# Patient Record
Sex: Female | Born: 1945
Health system: Southern US, Community
[De-identification: ages and names within clinical notes are randomized; demographics above are authoritative.]

## PROBLEM LIST (undated history)

## (undated) DIAGNOSIS — I499 Cardiac arrhythmia, unspecified: Secondary | ICD-10-CM

## (undated) DIAGNOSIS — Q231 Congenital insufficiency of aortic valve: Secondary | ICD-10-CM

## (undated) DIAGNOSIS — I350 Nonrheumatic aortic (valve) stenosis: Secondary | ICD-10-CM

## (undated) DIAGNOSIS — R203 Hyperesthesia: Secondary | ICD-10-CM

## (undated) DIAGNOSIS — G4733 Obstructive sleep apnea (adult) (pediatric): Secondary | ICD-10-CM

## (undated) DIAGNOSIS — M1990 Unspecified osteoarthritis, unspecified site: Secondary | ICD-10-CM

## (undated) DIAGNOSIS — G4730 Sleep apnea, unspecified: Secondary | ICD-10-CM

## (undated) DIAGNOSIS — L853 Xerosis cutis: Secondary | ICD-10-CM

## (undated) DIAGNOSIS — I509 Heart failure, unspecified: Secondary | ICD-10-CM

## (undated) DIAGNOSIS — S060X9A Concussion with loss of consciousness of unspecified duration, initial encounter: Secondary | ICD-10-CM

## (undated) DIAGNOSIS — T4145XA Adverse effect of unspecified anesthetic, initial encounter: Secondary | ICD-10-CM

## (undated) DIAGNOSIS — I10 Essential (primary) hypertension: Secondary | ICD-10-CM

## (undated) DIAGNOSIS — Z9889 Other specified postprocedural states: Secondary | ICD-10-CM

## (undated) DIAGNOSIS — M4800 Spinal stenosis, site unspecified: Secondary | ICD-10-CM

## (undated) DIAGNOSIS — R112 Nausea with vomiting, unspecified: Secondary | ICD-10-CM

## (undated) DIAGNOSIS — E785 Hyperlipidemia, unspecified: Secondary | ICD-10-CM

## (undated) DIAGNOSIS — F32A Depression, unspecified: Secondary | ICD-10-CM

## (undated) DIAGNOSIS — L719 Rosacea, unspecified: Secondary | ICD-10-CM

## (undated) DIAGNOSIS — Z952 Presence of prosthetic heart valve: Secondary | ICD-10-CM

## (undated) DIAGNOSIS — E119 Type 2 diabetes mellitus without complications: Secondary | ICD-10-CM

## (undated) DIAGNOSIS — K760 Fatty (change of) liver, not elsewhere classified: Secondary | ICD-10-CM

## (undated) DIAGNOSIS — K635 Polyp of colon: Secondary | ICD-10-CM

## (undated) DIAGNOSIS — K5790 Diverticulosis of intestine, part unspecified, without perforation or abscess without bleeding: Secondary | ICD-10-CM

## (undated) DIAGNOSIS — T8859XA Other complications of anesthesia, initial encounter: Secondary | ICD-10-CM

## (undated) DIAGNOSIS — T7840XA Allergy, unspecified, initial encounter: Secondary | ICD-10-CM

## (undated) DIAGNOSIS — R011 Cardiac murmur, unspecified: Secondary | ICD-10-CM

## (undated) DIAGNOSIS — M1711 Unilateral primary osteoarthritis, right knee: Secondary | ICD-10-CM

## (undated) DIAGNOSIS — R51 Headache: Secondary | ICD-10-CM

## (undated) DIAGNOSIS — F329 Major depressive disorder, single episode, unspecified: Secondary | ICD-10-CM

## (undated) DIAGNOSIS — J189 Pneumonia, unspecified organism: Secondary | ICD-10-CM

## (undated) DIAGNOSIS — R0989 Other specified symptoms and signs involving the circulatory and respiratory systems: Secondary | ICD-10-CM

## (undated) DIAGNOSIS — R000 Tachycardia, unspecified: Secondary | ICD-10-CM

## (undated) DIAGNOSIS — Z8489 Family history of other specified conditions: Secondary | ICD-10-CM

## (undated) DIAGNOSIS — F419 Anxiety disorder, unspecified: Secondary | ICD-10-CM

## (undated) DIAGNOSIS — K219 Gastro-esophageal reflux disease without esophagitis: Secondary | ICD-10-CM

## (undated) DIAGNOSIS — R0602 Shortness of breath: Secondary | ICD-10-CM

## (undated) HISTORY — DX: Heart failure, unspecified: I50.9

## (undated) HISTORY — PX: TUBAL LIGATION: SHX77

## (undated) HISTORY — PX: HERNIA REPAIR: SHX51

## (undated) HISTORY — DX: Nonrheumatic aortic (valve) stenosis: I35.0

## (undated) HISTORY — PX: COLONOSCOPY: SHX174

## (undated) HISTORY — PX: CHOLECYSTECTOMY: SHX55

## (undated) HISTORY — DX: Cardiac arrhythmia, unspecified: I49.9

## (undated) HISTORY — DX: Diverticulosis of intestine, part unspecified, without perforation or abscess without bleeding: K57.90

## (undated) HISTORY — DX: Obstructive sleep apnea (adult) (pediatric): G47.33

## (undated) HISTORY — DX: Hyperlipidemia, unspecified: E78.5

## (undated) HISTORY — PX: EYE SURGERY: SHX253

## (undated) HISTORY — PX: INCISIONAL HERNIA REPAIR: SHX193

## (undated) HISTORY — PX: ABDOMINAL HYSTERECTOMY: SHX81

## (undated) HISTORY — DX: Polyp of colon: K63.5

## (undated) HISTORY — DX: Spinal stenosis, site unspecified: M48.00

## (undated) HISTORY — DX: Morbid (severe) obesity due to excess calories: E66.01

## (undated) HISTORY — DX: Type 2 diabetes mellitus without complications: E11.9

## (undated) HISTORY — PX: BREAST SURGERY: SHX581

## (undated) HISTORY — PX: BIOPSY BREAST: PRO8

## (undated) HISTORY — PX: JOINT REPLACEMENT: SHX530

## (undated) HISTORY — DX: Other specified symptoms and signs involving the circulatory and respiratory systems: R09.89

## (undated) HISTORY — PX: ANKLE RECONSTRUCTION: SHX1151

## (undated) HISTORY — DX: Allergy, unspecified, initial encounter: T78.40XA

---
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
## 2011-01-13 ENCOUNTER — Other Ambulatory Visit (HOSPITAL_COMMUNITY): Payer: Self-pay | Admitting: Orthopedic Surgery

## 2011-01-13 ENCOUNTER — Encounter (HOSPITAL_COMMUNITY)
Admission: RE | Admit: 2011-01-13 | Discharge: 2011-01-13 | Disposition: A | Discharge status: Home / Self Care | Payer: BC Managed Care – PPO | Source: Ambulatory Visit | Attending: Orthopedic Surgery | Admitting: Orthopedic Surgery

## 2011-01-13 ENCOUNTER — Ambulatory Visit (HOSPITAL_COMMUNITY)
Admission: RE | Admit: 2011-01-13 | Discharge: 2011-01-13 | Disposition: A | Discharge status: Home / Self Care | Payer: BC Managed Care – PPO | Source: Ambulatory Visit | Attending: Orthopedic Surgery | Admitting: Orthopedic Surgery

## 2011-01-13 DIAGNOSIS — M1710 Unilateral primary osteoarthritis, unspecified knee: Secondary | ICD-10-CM

## 2011-01-13 DIAGNOSIS — Z01812 Encounter for preprocedural laboratory examination: Secondary | ICD-10-CM | POA: Insufficient documentation

## 2011-01-13 DIAGNOSIS — IMO0002 Reserved for concepts with insufficient information to code with codable children: Secondary | ICD-10-CM | POA: Insufficient documentation

## 2011-01-13 DIAGNOSIS — Z01818 Encounter for other preprocedural examination: Secondary | ICD-10-CM | POA: Insufficient documentation

## 2011-01-13 LAB — CBC
HCT: 41.4 % (ref 36.0–46.0)
Hemoglobin: 13.6 g/dL (ref 12.0–15.0)
MCH: 29.2 pg (ref 26.0–34.0)
MCHC: 32.9 g/dL (ref 30.0–36.0)
MCV: 89 fL (ref 78.0–100.0)
Platelets: 257 10*3/uL (ref 150–400)
RBC: 4.65 MIL/uL (ref 3.87–5.11)
RDW: 13.5 % (ref 11.5–15.5)
WBC: 12 10*3/uL — ABNORMAL HIGH (ref 4.0–10.5)

## 2011-01-13 LAB — COMPREHENSIVE METABOLIC PANEL
ALT: 23 U/L (ref 0–35)
AST: 21 U/L (ref 0–37)
Albumin: 3.7 g/dL (ref 3.5–5.2)
Alkaline Phosphatase: 95 U/L (ref 39–117)
BUN: 16 mg/dL (ref 6–23)
CO2: 24 mEq/L (ref 19–32)
Calcium: 9.5 mg/dL (ref 8.4–10.5)
Chloride: 102 mEq/L (ref 96–112)
Creatinine, Ser: 0.76 mg/dL (ref 0.50–1.10)
GFR calc Af Amer: >60 mL/min (ref >60)
GFR calc non Af Amer: >60 mL/min (ref >60)
Glucose, Bld: 92 mg/dL (ref 70–99)
Potassium: 4.4 mEq/L (ref 3.5–5.1)
Sodium: 137 mEq/L (ref 135–145)
Total Bilirubin: 0.3 mg/dL (ref 0.3–1.2)
Total Protein: 7.1 g/dL (ref 6.0–8.3)

## 2011-01-13 LAB — PROTIME-INR
INR: 0.91 (ref 0.00–1.49)
Prothrombin Time: 12.4 seconds (ref 11.6–15.2)

## 2011-01-13 LAB — TYPE AND SCREEN
ABO/RH(D): A POS
Antibody Screen: NEGATIVE

## 2011-01-13 LAB — SURGICAL PCR SCREEN
MRSA, PCR: NEGATIVE
Staphylococcus aureus: NEGATIVE

## 2011-01-13 LAB — ABO/RH: ABO/RH(D): A POS

## 2011-01-19 ENCOUNTER — Inpatient Hospital Stay (HOSPITAL_COMMUNITY)
Admission: RE | Admit: 2011-01-19 | Payer: BC Managed Care – PPO | Source: Ambulatory Visit | Admitting: Orthopedic Surgery

## 2012-04-18 ENCOUNTER — Other Ambulatory Visit (HOSPITAL_COMMUNITY): Payer: BC Managed Care – PPO

## 2012-04-25 ENCOUNTER — Encounter (HOSPITAL_COMMUNITY): Admission: RE | Payer: Self-pay | Source: Ambulatory Visit

## 2012-04-25 ENCOUNTER — Inpatient Hospital Stay (HOSPITAL_COMMUNITY)
Admission: RE | Admit: 2012-04-25 | Payer: BC Managed Care – PPO | Source: Ambulatory Visit | Admitting: Orthopedic Surgery

## 2012-04-25 SURGERY — ARTHROPLASTY, KNEE, TOTAL
Anesthesia: General | Laterality: Right

## 2012-04-27 ENCOUNTER — Ambulatory Visit
Admission: RE | Admit: 2012-04-27 | Discharge: 2012-04-27 | Disposition: A | Discharge status: Home / Self Care | Payer: BC Managed Care – PPO | Source: Ambulatory Visit | Attending: Otolaryngology | Admitting: Otolaryngology

## 2012-04-27 ENCOUNTER — Other Ambulatory Visit: Payer: Self-pay | Admitting: Otolaryngology

## 2012-04-27 DIAGNOSIS — J329 Chronic sinusitis, unspecified: Secondary | ICD-10-CM

## 2012-05-30 ENCOUNTER — Other Ambulatory Visit (HOSPITAL_COMMUNITY): Payer: BC Managed Care – PPO

## 2012-06-06 ENCOUNTER — Inpatient Hospital Stay (HOSPITAL_COMMUNITY)
Admission: RE | Admit: 2012-06-06 | Payer: BC Managed Care – PPO | Source: Ambulatory Visit | Admitting: Orthopedic Surgery

## 2012-06-06 ENCOUNTER — Encounter (HOSPITAL_COMMUNITY): Admission: RE | Payer: Self-pay | Source: Ambulatory Visit

## 2012-06-06 SURGERY — ARTHROPLASTY, KNEE, TOTAL
Anesthesia: Spinal | Laterality: Right

## 2012-11-09 DIAGNOSIS — S060X9A Concussion with loss of consciousness of unspecified duration, initial encounter: Secondary | ICD-10-CM

## 2012-11-09 DIAGNOSIS — S060XAA Concussion with loss of consciousness status unknown, initial encounter: Secondary | ICD-10-CM

## 2012-11-09 HISTORY — DX: Concussion with loss of consciousness status unknown, initial encounter: S06.0XAA

## 2012-11-09 HISTORY — DX: Concussion with loss of consciousness of unspecified duration, initial encounter: S06.0X9A

## 2013-05-15 ENCOUNTER — Encounter (HOSPITAL_COMMUNITY): Payer: Self-pay | Admitting: Pharmacy Technician

## 2013-05-16 ENCOUNTER — Other Ambulatory Visit: Payer: Self-pay | Admitting: Orthopedic Surgery

## 2013-05-22 ENCOUNTER — Encounter (HOSPITAL_COMMUNITY): Payer: Self-pay

## 2013-05-22 ENCOUNTER — Encounter (HOSPITAL_COMMUNITY)
Admission: RE | Admit: 2013-05-22 | Discharge: 2013-05-22 | Disposition: A | Discharge status: Home / Self Care | Payer: Medicare Other | Source: Ambulatory Visit | Attending: Orthopedic Surgery | Admitting: Orthopedic Surgery

## 2013-05-22 ENCOUNTER — Ambulatory Visit (HOSPITAL_COMMUNITY)
Admission: RE | Admit: 2013-05-22 | Discharge: 2013-05-22 | Disposition: A | Discharge status: Home / Self Care | Payer: Medicare Other | Source: Ambulatory Visit | Attending: Anesthesiology | Admitting: Anesthesiology

## 2013-05-22 DIAGNOSIS — Z01818 Encounter for other preprocedural examination: Secondary | ICD-10-CM | POA: Insufficient documentation

## 2013-05-22 DIAGNOSIS — Z01810 Encounter for preprocedural cardiovascular examination: Secondary | ICD-10-CM | POA: Insufficient documentation

## 2013-05-22 DIAGNOSIS — Z01812 Encounter for preprocedural laboratory examination: Secondary | ICD-10-CM | POA: Insufficient documentation

## 2013-05-22 HISTORY — DX: Xerosis cutis: L85.3

## 2013-05-22 HISTORY — DX: Gastro-esophageal reflux disease without esophagitis: K21.9

## 2013-05-22 HISTORY — DX: Unspecified osteoarthritis, unspecified site: M19.90

## 2013-05-22 HISTORY — DX: Cardiac murmur, unspecified: R01.1

## 2013-05-22 HISTORY — DX: Type 2 diabetes mellitus without complications: E11.9

## 2013-05-22 HISTORY — DX: Other specified postprocedural states: Z98.890

## 2013-05-22 HISTORY — DX: Family history of other specified conditions: Z84.89

## 2013-05-22 HISTORY — DX: Nausea with vomiting, unspecified: R11.2

## 2013-05-22 HISTORY — DX: Congenital insufficiency of aortic valve: Q23.1

## 2013-05-22 HISTORY — DX: Cardiac arrhythmia, unspecified: I49.9

## 2013-05-22 HISTORY — DX: Adverse effect of unspecified anesthetic, initial encounter: T41.45XA

## 2013-05-22 HISTORY — DX: Tachycardia, unspecified: R00.0

## 2013-05-22 HISTORY — DX: Sleep apnea, unspecified: G47.30

## 2013-05-22 HISTORY — DX: Concussion with loss of consciousness of unspecified duration, initial encounter: S06.0X9A

## 2013-05-22 HISTORY — DX: Other complications of anesthesia, initial encounter: T88.59XA

## 2013-05-22 LAB — CBC
HCT: 40.8 % (ref 36.0–46.0)
Hemoglobin: 13.3 g/dL (ref 12.0–15.0)
MCH: 30.2 pg (ref 26.0–34.0)
MCHC: 32.6 g/dL (ref 30.0–36.0)
MCV: 92.5 fL (ref 78.0–100.0)
Platelets: 235 10*3/uL (ref 150–400)
RBC: 4.41 MIL/uL (ref 3.87–5.11)
RDW: 13.4 % (ref 11.5–15.5)
WBC: 12.2 10*3/uL — ABNORMAL HIGH (ref 4.0–10.5)

## 2013-05-22 LAB — BASIC METABOLIC PANEL
BUN: 9 mg/dL (ref 6–23)
CO2: 23 mEq/L (ref 19–32)
Calcium: 9.3 mg/dL (ref 8.4–10.5)
Chloride: 103 mEq/L (ref 96–112)
Creatinine, Ser: 0.68 mg/dL (ref 0.50–1.10)
GFR calc Af Amer: >90 mL/min (ref >90)
GFR calc non Af Amer: 90 mL/min — ABNORMAL LOW (ref >90)
Glucose, Bld: 83 mg/dL (ref 70–99)
Potassium: 4.5 mEq/L (ref 3.7–5.3)
Sodium: 140 mEq/L (ref 137–147)

## 2013-05-22 LAB — TYPE AND SCREEN
ABO/RH(D): A POS
Antibody Screen: NEGATIVE

## 2013-05-22 LAB — APTT: aPTT: 30 seconds (ref 24–37)

## 2013-05-22 LAB — SURGICAL PCR SCREEN
MRSA, PCR: NEGATIVE
Staphylococcus aureus: POSITIVE — AB

## 2013-05-22 LAB — PROTIME-INR
INR: 0.96 (ref 0.00–1.49)
Prothrombin Time: 12.6 seconds (ref 11.6–15.2)

## 2013-05-22 NOTE — Progress Notes (Signed)
I call a prescription to [facility] [address], [address], [place] for Mupirocin 2 % ointment- apply to each nostril 2 times a day for 5 days.

## 2013-05-22 NOTE — Progress Notes (Signed)
Patient informed Nurse that she had a stress test with Cardiologist Dr. Donnetta Hutching in [place]. Will request records. PCP is Dr. Reita Cliche also in [facility].

## 2013-05-22 NOTE — Pre-Procedure Instructions (Signed)
Kara Lee  05/22/2013   Your procedure is scheduled on:  Tuesday May 29, 2013 at 7:30 AM.  Report to [facility] Short Stay Entrance "A"  Admitting at 5:30 AM.  Call this number if you have problems the morning of surgery: [phone]   Remember:   Do not eat food or drink liquids after midnight.   Take these medicines the morning of surgery with A SIP OF WATER: Acetaminophen (Tylenol) if needed for pain, Atenolol (Tenormin), Clonazepam (Klonopin), Fluoxetine (Prozac), Pantoprazole (Protonix), and eye drops.    Do NOT take any diabetic medications the morning of your surgery   Do not wear jewelry, make-up or nail polish.  Do not wear lotions, powders, or perfumes. You may wear deodorant.  Do not shave 48 hours prior to surgery.   Do not bring valuables to the hospital.  [facility] is not responsible for any belongings or valuables.               Contacts, dentures or bridgework may not be worn into surgery.  Leave suitcase in the car. After surgery it may be brought to your room.  For patients admitted to the hospital, discharge time is determined by your treatment team.               Patients discharged the day of surgery will not be allowed to drive home.  Name and phone number of your driver: Family/Friend  Special Instructions: Please shower using CHG soap the night before and the morning of your surgery   Please read over the following fact sheets that you were given: Pain Booklet, Coughing and Deep Breathing, Blood Transfusion Information, Total Joint Packet, MRSA Information and Surgical Site Infection Prevention    [place]- Preparing For Surgery  Before surgery, you can play an important role. Because skin is not sterile, your skin needs to be as free of germs as possible. You can reduce the number of germs on your skin by washing with CHG (chlorahexidine gluconate) Soap before surgery.  CHG is an antiseptic cleaner which kills germs and bonds with the skin to  continue killing germs even after washing.  Please do not use if you have an allergy to CHG or antibacterial soaps. If your skin becomes reddened/irritated stop using the CHG.  Do not shave (including legs and underarms) for at least 48 hours prior to first CHG shower. It is OK to shave your face.  Please follow these instructions carefully.   1. Shower the night before surgery and the morning of surgery with CHG.   2. If you chose to wash your hair, wash your hair first as usual with your normal shampoo.  3. After you shampoo, rinse your hair and body thoroughly to remove the shampoo.  4. Use CHG as you would any other liquid soap. You can apply CHG directly to the skin and wash gently with a scrungie or a clean washcloth.   5. Apply the CHG Soap to your body ONLY FROM THE NECK DOWN.  Do not use on open wounds or open sores. Avoid contact with your eyes, ears, mouth and genitals (private parts). Wash genitals (private parts) with your normal soap.  6. Wash thoroughly, paying special attention to the area where your surgery will be performed.  7. Thoroughly rinse your body with warm water from the neck down.  8. DO NOT shower/wash with your normal soap after using and rinsing off the CHG Soap.  9. Pat yourself dry with  a clean towel.   10. Wear clean pajamas   11. Place clean sheets on your bed the night of your first shower and do not sleep with pets.   Day of Surgery: Do not apply any deodorants/lotions. Please wear clean clothes to the hospital/surgery center.

## 2013-05-23 NOTE — Progress Notes (Signed)
[place] office called about EKG no recent EKG noted.

## 2013-05-31 ENCOUNTER — Other Ambulatory Visit: Payer: Self-pay | Admitting: Orthopedic Surgery

## 2013-07-20 ENCOUNTER — Encounter (HOSPITAL_COMMUNITY): Payer: Self-pay

## 2013-07-20 ENCOUNTER — Other Ambulatory Visit (HOSPITAL_COMMUNITY): Payer: Self-pay | Admitting: *Deleted

## 2013-07-20 ENCOUNTER — Encounter (HOSPITAL_COMMUNITY)
Admission: RE | Admit: 2013-07-20 | Discharge: 2013-07-20 | Disposition: A | Discharge status: Home / Self Care | Payer: Medicare Other | Source: Ambulatory Visit | Attending: Orthopedic Surgery | Admitting: Orthopedic Surgery

## 2013-07-20 DIAGNOSIS — Z01812 Encounter for preprocedural laboratory examination: Secondary | ICD-10-CM | POA: Insufficient documentation

## 2013-07-20 HISTORY — DX: Headache: R51

## 2013-07-20 HISTORY — DX: Major depressive disorder, single episode, unspecified: F32.9

## 2013-07-20 HISTORY — DX: Pneumonia, unspecified organism: J18.9

## 2013-07-20 HISTORY — DX: Shortness of breath: R06.02

## 2013-07-20 HISTORY — DX: Fatty (change of) liver, not elsewhere classified: K76.0

## 2013-07-20 HISTORY — DX: Rosacea, unspecified: L71.9

## 2013-07-20 HISTORY — DX: Depression, unspecified: F32.A

## 2013-07-20 HISTORY — DX: Hyperesthesia: R20.3

## 2013-07-20 LAB — SURGICAL PCR SCREEN
MRSA, PCR: NEGATIVE
Staphylococcus aureus: NEGATIVE

## 2013-07-20 LAB — BASIC METABOLIC PANEL
BUN: 9 mg/dL (ref 6–23)
CO2: 20 mEq/L (ref 19–32)
Calcium: 9.3 mg/dL (ref 8.4–10.5)
Chloride: 101 mEq/L (ref 96–112)
Creatinine, Ser: 0.56 mg/dL (ref 0.50–1.10)
GFR calc Af Amer: >90 mL/min (ref >90)
GFR calc non Af Amer: >90 mL/min (ref >90)
Glucose, Bld: 147 mg/dL — ABNORMAL HIGH (ref 70–99)
Potassium: 4.4 mEq/L (ref 3.7–5.3)
Sodium: 137 mEq/L (ref 137–147)

## 2013-07-20 LAB — APTT: aPTT: 27 seconds (ref 24–37)

## 2013-07-20 LAB — CBC
HCT: 40.7 % (ref 36.0–46.0)
Hemoglobin: 13.6 g/dL (ref 12.0–15.0)
MCH: 30.4 pg (ref 26.0–34.0)
MCHC: 33.4 g/dL (ref 30.0–36.0)
MCV: 91.1 fL (ref 78.0–100.0)
Platelets: 246 10*3/uL (ref 150–400)
RBC: 4.47 MIL/uL (ref 3.87–5.11)
RDW: 13.6 % (ref 11.5–15.5)
WBC: 10.1 10*3/uL (ref 4.0–10.5)

## 2013-07-20 LAB — PROTIME-INR
INR: 0.98 (ref 0.00–1.49)
Prothrombin Time: 12.8 seconds (ref 11.6–15.2)

## 2013-07-20 LAB — TYPE AND SCREEN
ABO/RH(D): A POS
Antibody Screen: NEGATIVE

## 2013-07-20 NOTE — Pre-Procedure Instructions (Signed)
Kara Lee  07/20/2013   Your procedure is scheduled on:  Tuesday, July 31, 2013 at 7:30 AM.   Report to [facility] Entrance "A" Admitting Office at 5:30 AM.   Call this number if you have problems the morning of surgery: [phone]   Remember:   Do not eat food or drink liquids after midnight Monday, 07/30/13.   Take these medicines the morning of surgery with A SIP OF WATER: atenolol (TENORMIN), clonazePAM (KLONOPIN), FLUoxetine (PROZAC), pantoprazole (PROTONIX), acetaminophen (TYLENOL) - if needed. You may use your eye drops.  Do not take your diabetic medications the morning of surgery.    Do not wear jewelry, make-up or nail polish.  Do not wear lotions, powders, or perfumes. You may wear deodorant.  Do not shave 48 hours prior to surgery.   Do not bring valuables to the hospital.  [facility] is not responsible                  for any belongings or valuables.               Contacts, dentures or bridgework may not be worn into surgery.  Leave suitcase in the car. After surgery it may be brought to your room.  For patients admitted to the hospital, discharge time is determined by your                treatment team.               Special Instructions: [place] - Preparing for Surgery  Before surgery, you can play an important role.  Because skin is not sterile, your skin needs to be as free of germs as possible.  You can reduce the number of germs on you skin by washing with CHG (chlorahexidine gluconate) soap before surgery.  CHG is an antiseptic cleaner which kills germs and bonds with the skin to continue killing germs even after washing.  Please DO NOT use if you have an allergy to CHG or antibacterial soaps.  If your skin becomes reddened/irritated stop using the CHG and inform your nurse when you arrive at Short Stay.  Do not shave (including legs and underarms) for at least 48 hours prior to the first CHG shower.  You may shave your face.  Please follow  these instructions carefully:   1.  Shower with CHG Soap the night before surgery and the                                morning of Surgery.  2.  If you choose to wash your hair, wash your hair first as usual with your       normal shampoo.  3.  After you shampoo, rinse your hair and body thoroughly to remove the                      Shampoo.  4.  Use CHG as you would any other liquid soap.  You can apply chg directly       to the skin and wash gently with scrungie or a clean washcloth.  5.  Apply the CHG Soap to your body ONLY FROM THE NECK DOWN.        Do not use on open wounds or open sores.  Avoid contact with your eyes, ears, mouth and genitals (private parts).  Wash genitals (private parts) with your normal  soap.  6.  Wash thoroughly, paying special attention to the area where your surgery        will be performed.  7.  Thoroughly rinse your body with warm water from the neck down.  8.  DO NOT shower/wash with your normal soap after using and rinsing off       the CHG Soap.  9.  Pat yourself dry with a clean towel.            10.  Wear clean pajamas.            11.  Place clean sheets on your bed the night of your first shower and do not        sleep with pets.  Day of Surgery  Do not apply any lotions the morning of surgery.  Please wear clean clothes to the hospital/surgery center.     Please read over the following fact sheets that you were given: Pain Booklet, Coughing and Deep Breathing, Blood Transfusion Information, MRSA Information and Surgical Site Infection Prevention

## 2013-07-30 MED ORDER — CEFAZOLIN SODIUM-DEXTROSE 2-3 GM-% IV SOLR: 2.0000 g | INTRAVENOUS | Status: DC

## 2013-07-31 ENCOUNTER — Encounter (HOSPITAL_COMMUNITY): Payer: Medicare Other | Admitting: Anesthesiology

## 2013-07-31 ENCOUNTER — Inpatient Hospital Stay (HOSPITAL_COMMUNITY): Payer: Medicare Other

## 2013-07-31 ENCOUNTER — Encounter (HOSPITAL_COMMUNITY): Payer: Self-pay | Admitting: *Deleted

## 2013-07-31 ENCOUNTER — Encounter (HOSPITAL_COMMUNITY)
Admission: RE | Disposition: A | Discharge status: Home Health | Payer: Self-pay | Source: Ambulatory Visit | Attending: Orthopedic Surgery

## 2013-07-31 ENCOUNTER — Inpatient Hospital Stay (HOSPITAL_COMMUNITY)
Admission: RE | Admit: 2013-07-31 | Discharge: 2013-08-01 | DRG: 489 | Disposition: A | Discharge status: Home Health | Payer: Medicare Other | Source: Ambulatory Visit | Attending: Orthopedic Surgery | Admitting: Orthopedic Surgery

## 2013-07-31 ENCOUNTER — Inpatient Hospital Stay (HOSPITAL_COMMUNITY): Payer: Medicare Other | Admitting: Anesthesiology

## 2013-07-31 DIAGNOSIS — K219 Gastro-esophageal reflux disease without esophagitis: Secondary | ICD-10-CM | POA: Diagnosis present

## 2013-07-31 DIAGNOSIS — M179 Osteoarthritis of knee, unspecified: Secondary | ICD-10-CM | POA: Diagnosis present

## 2013-07-31 DIAGNOSIS — G4730 Sleep apnea, unspecified: Secondary | ICD-10-CM | POA: Diagnosis present

## 2013-07-31 DIAGNOSIS — M1711 Unilateral primary osteoarthritis, right knee: Secondary | ICD-10-CM

## 2013-07-31 DIAGNOSIS — Z91013 Allergy to seafood: Secondary | ICD-10-CM

## 2013-07-31 DIAGNOSIS — Z885 Allergy status to narcotic agent status: Secondary | ICD-10-CM

## 2013-07-31 DIAGNOSIS — Z888 Allergy status to other drugs, medicaments and biological substances status: Secondary | ICD-10-CM

## 2013-07-31 DIAGNOSIS — Z833 Family history of diabetes mellitus: Secondary | ICD-10-CM

## 2013-07-31 DIAGNOSIS — E119 Type 2 diabetes mellitus without complications: Secondary | ICD-10-CM | POA: Diagnosis present

## 2013-07-31 DIAGNOSIS — F3289 Other specified depressive episodes: Secondary | ICD-10-CM | POA: Diagnosis present

## 2013-07-31 DIAGNOSIS — F329 Major depressive disorder, single episode, unspecified: Secondary | ICD-10-CM | POA: Diagnosis present

## 2013-07-31 DIAGNOSIS — M1710 Unilateral primary osteoarthritis, unspecified knee: Principal | ICD-10-CM | POA: Diagnosis present

## 2013-07-31 DIAGNOSIS — Z882 Allergy status to sulfonamides status: Secondary | ICD-10-CM

## 2013-07-31 DIAGNOSIS — Z91040 Latex allergy status: Secondary | ICD-10-CM

## 2013-07-31 HISTORY — DX: Unilateral primary osteoarthritis, right knee: M17.11

## 2013-07-31 HISTORY — PX: PARTIAL KNEE ARTHROPLASTY: SHX2174

## 2013-07-31 LAB — GLUCOSE, CAPILLARY
Glucose-Capillary: 118 mg/dL — ABNORMAL HIGH (ref 70–99)
Glucose-Capillary: 143 mg/dL — ABNORMAL HIGH (ref 70–99)
Glucose-Capillary: 115 mg/dL — ABNORMAL HIGH (ref 70–99)
Glucose-Capillary: 111 mg/dL — ABNORMAL HIGH (ref 70–99)

## 2013-07-31 SURGERY — ARTHROPLASTY, KNEE, UNICOMPARTMENTAL
Anesthesia: General | Site: Knee | Laterality: Right

## 2013-07-31 MED ORDER — KETOROLAC TROMETHAMINE 15 MG/ML IJ SOLN
7.5000 mg | Freq: Four times a day (QID) | INTRAMUSCULAR | Status: AC
  Administered 2013-07-31 – 2013-08-01 (×3): 7.5 mg via INTRAVENOUS
  Filled 2013-07-31 (×2): qty 1

## 2013-07-31 MED ORDER — ONDANSETRON HCL 4 MG/2ML IJ SOLN: 4.0000 mg | Freq: Four times a day (QID) | INTRAMUSCULAR | Status: DC | PRN

## 2013-07-31 MED ORDER — METOCLOPRAMIDE HCL 5 MG/ML IJ SOLN: 5.0000 mg | Freq: Three times a day (TID) | INTRAMUSCULAR | Status: DC | PRN

## 2013-07-31 MED ORDER — ONDANSETRON HCL 4 MG/2ML IJ SOLN
INTRAMUSCULAR | Status: AC
  Filled 2013-07-31: qty 2

## 2013-07-31 MED ORDER — SENNA 8.6 MG PO TABS
1.0000 | ORAL_TABLET | Freq: Two times a day (BID) | ORAL | Status: DC
  Administered 2013-08-01 (×2): 8.6 mg via ORAL
  Filled 2013-07-31 (×4): qty 1

## 2013-07-31 MED ORDER — EPHEDRINE SULFATE 50 MG/ML IJ SOLN
INTRAMUSCULAR | Status: AC
  Filled 2013-07-31: qty 1

## 2013-07-31 MED ORDER — FLUOXETINE HCL 40 MG PO CAPS: 40.0000 mg | ORAL_CAPSULE | Freq: Every day | ORAL | Status: DC

## 2013-07-31 MED ORDER — SUCCINYLCHOLINE CHLORIDE 20 MG/ML IJ SOLN
INTRAMUSCULAR | Status: AC
  Filled 2013-07-31: qty 1

## 2013-07-31 MED ORDER — DEXTROSE 5 % IV SOLN
500.0000 mg | Freq: Four times a day (QID) | INTRAVENOUS | Status: DC | PRN
  Filled 2013-07-31: qty 5

## 2013-07-31 MED ORDER — POTASSIUM CHLORIDE IN NACL 20-0.45 MEQ/L-% IV SOLN
INTRAVENOUS | Status: DC
  Administered 2013-07-31: 20:00:00 via INTRAVENOUS
  Filled 2013-07-31 (×6): qty 1000

## 2013-07-31 MED ORDER — METFORMIN HCL ER 500 MG PO TB24: 500.0000 mg | ORAL_TABLET | Freq: Every day | ORAL | Status: DC

## 2013-07-31 MED ORDER — ACETAMINOPHEN 650 MG RE SUPP: 650.0000 mg | Freq: Four times a day (QID) | RECTAL | Status: DC | PRN

## 2013-07-31 MED ORDER — BUPIVACAINE LIPOSOME 1.3 % IJ SUSP
20.0000 mL | Freq: Once | INTRAMUSCULAR | Status: DC
  Filled 2013-07-31: qty 20

## 2013-07-31 MED ORDER — ALUM & MAG HYDROXIDE-SIMETH 200-200-20 MG/5ML PO SUSP: 30.0000 mL | ORAL | Status: DC | PRN

## 2013-07-31 MED ORDER — FENTANYL CITRATE 0.05 MG/ML IJ SOLN
INTRAMUSCULAR | Status: AC
  Filled 2013-07-31: qty 5

## 2013-07-31 MED ORDER — PHENOL 1.4 % MT LIQD: 1.0000 | OROMUCOSAL | Status: DC | PRN

## 2013-07-31 MED ORDER — METOCLOPRAMIDE HCL 10 MG PO TABS: 5.0000 mg | ORAL_TABLET | Freq: Three times a day (TID) | ORAL | Status: DC | PRN

## 2013-07-31 MED ORDER — PROPOFOL 10 MG/ML IV BOLUS
INTRAVENOUS | Status: AC
  Filled 2013-07-31: qty 20

## 2013-07-31 MED ORDER — POLYETHYLENE GLYCOL 3350 17 G PO PACK: 17.0000 g | PACK | Freq: Every day | ORAL | Status: DC | PRN

## 2013-07-31 MED ORDER — MENTHOL 3 MG MT LOZG: 1.0000 | LOZENGE | OROMUCOSAL | Status: DC | PRN

## 2013-07-31 MED ORDER — FENTANYL CITRATE 0.05 MG/ML IJ SOLN
INTRAMUSCULAR | Status: AC
  Filled 2013-07-31: qty 2

## 2013-07-31 MED ORDER — ROCURONIUM BROMIDE 100 MG/10ML IV SOLN
INTRAVENOUS | Status: DC | PRN
  Administered 2013-07-31: 10 mg via INTRAVENOUS
  Administered 2013-07-31: 30 mg via INTRAVENOUS

## 2013-07-31 MED ORDER — SODIUM CHLORIDE 0.9 % IJ SOLN
INTRAMUSCULAR | Status: DC | PRN
  Administered 2013-07-31: 40 mL

## 2013-07-31 MED ORDER — BUPIVACAINE LIPOSOME 1.3 % IJ SUSP
INTRAMUSCULAR | Status: DC | PRN
  Administered 2013-07-31: 20 mL

## 2013-07-31 MED ORDER — BISACODYL 10 MG RE SUPP: 10.0000 mg | Freq: Every day | RECTAL | Status: DC | PRN

## 2013-07-31 MED ORDER — LACTATED RINGERS IV SOLN
INTRAVENOUS | Status: DC | PRN
  Administered 2013-07-31 (×2): via INTRAVENOUS

## 2013-07-31 MED ORDER — KETOROLAC TROMETHAMINE 15 MG/ML IJ SOLN: 7.5000 mg | Freq: Four times a day (QID) | INTRAMUSCULAR | Status: DC

## 2013-07-31 MED ORDER — LIDOCAINE HCL 4 % MT SOLN
OROMUCOSAL | Status: DC | PRN
  Administered 2013-07-31: 4 mL via TOPICAL

## 2013-07-31 MED ORDER — PANTOPRAZOLE SODIUM 40 MG PO TBEC
40.0000 mg | DELAYED_RELEASE_TABLET | Freq: Every day | ORAL | Status: DC
  Administered 2013-08-01: 40 mg via ORAL
  Filled 2013-07-31: qty 1

## 2013-07-31 MED ORDER — INSULIN ASPART 100 UNIT/ML ~~LOC~~ SOLN
0.0000 [IU] | Freq: Three times a day (TID) | SUBCUTANEOUS | Status: DC
  Administered 2013-08-01: 2 [IU] via SUBCUTANEOUS

## 2013-07-31 MED ORDER — OXYCODONE HCL 5 MG PO TABS
5.0000 mg | ORAL_TABLET | ORAL | Status: DC | PRN
  Administered 2013-07-31 – 2013-08-01 (×5): 10 mg via ORAL
  Filled 2013-07-31 (×5): qty 2

## 2013-07-31 MED ORDER — METHOCARBAMOL 500 MG PO TABS
ORAL_TABLET | ORAL | Status: AC
  Administered 2013-07-31: 500 mg via ORAL
  Filled 2013-07-31: qty 1

## 2013-07-31 MED ORDER — SODIUM CHLORIDE 0.9 % IR SOLN
Status: DC | PRN
  Administered 2013-07-31: 3000 mL

## 2013-07-31 MED ORDER — ROCURONIUM BROMIDE 50 MG/5ML IV SOLN
INTRAVENOUS | Status: AC
  Filled 2013-07-31: qty 1

## 2013-07-31 MED ORDER — SODIUM CHLORIDE 0.9 % IJ SOLN
INTRAMUSCULAR | Status: AC
  Filled 2013-07-31: qty 10

## 2013-07-31 MED ORDER — PHENYLEPHRINE 40 MCG/ML (10ML) SYRINGE FOR IV PUSH (FOR BLOOD PRESSURE SUPPORT)
PREFILLED_SYRINGE | INTRAVENOUS | Status: AC
  Filled 2013-07-31: qty 10

## 2013-07-31 MED ORDER — LIDOCAINE HCL (CARDIAC) 20 MG/ML IV SOLN
INTRAVENOUS | Status: AC
  Filled 2013-07-31: qty 5

## 2013-07-31 MED ORDER — ONDANSETRON HCL 4 MG PO TABS: 4.0000 mg | ORAL_TABLET | Freq: Four times a day (QID) | ORAL | Status: DC | PRN

## 2013-07-31 MED ORDER — EPHEDRINE SULFATE 50 MG/ML IJ SOLN
INTRAMUSCULAR | Status: DC | PRN
  Administered 2013-07-31 (×2): 10 mg via INTRAVENOUS

## 2013-07-31 MED ORDER — FENTANYL CITRATE 0.05 MG/ML IJ SOLN
INTRAMUSCULAR | Status: DC | PRN
  Administered 2013-07-31: 150 ug via INTRAVENOUS
  Administered 2013-07-31 (×2): 100 ug via INTRAVENOUS

## 2013-07-31 MED ORDER — CLONAZEPAM 0.5 MG PO TABS
0.5000 mg | ORAL_TABLET | Freq: Two times a day (BID) | ORAL | Status: DC
  Administered 2013-07-31 – 2013-08-01 (×2): 0.5 mg via ORAL
  Filled 2013-07-31 (×2): qty 1

## 2013-07-31 MED ORDER — ONDANSETRON HCL 4 MG/2ML IJ SOLN
INTRAMUSCULAR | Status: DC | PRN
  Administered 2013-07-31 (×2): 4 mg via INTRAVENOUS

## 2013-07-31 MED ORDER — OXYCODONE-ACETAMINOPHEN 10-325 MG PO TABS: 1.0000 | ORAL_TABLET | Freq: Four times a day (QID) | ORAL | Status: DC | PRN

## 2013-07-31 MED ORDER — PROMETHAZINE HCL 25 MG PO TABS: 25.0000 mg | ORAL_TABLET | Freq: Four times a day (QID) | ORAL | Status: DC | PRN

## 2013-07-31 MED ORDER — CEFAZOLIN SODIUM-DEXTROSE 2-3 GM-% IV SOLR
INTRAVENOUS | Status: AC
  Administered 2013-07-31: 2 g via INTRAVENOUS
  Filled 2013-07-31: qty 50

## 2013-07-31 MED ORDER — DIPHENHYDRAMINE HCL 12.5 MG/5ML PO ELIX
12.5000 mg | ORAL_SOLUTION | ORAL | Status: DC | PRN
  Administered 2013-08-01: 25 mg via ORAL
  Filled 2013-07-31: qty 10

## 2013-07-31 MED ORDER — ACETAMINOPHEN 325 MG PO TABS: 650.0000 mg | ORAL_TABLET | Freq: Four times a day (QID) | ORAL | Status: DC | PRN

## 2013-07-31 MED ORDER — METHOCARBAMOL 500 MG PO TABS
500.0000 mg | ORAL_TABLET | Freq: Four times a day (QID) | ORAL | Status: DC | PRN
  Administered 2013-07-31 – 2013-08-01 (×4): 500 mg via ORAL
  Filled 2013-07-31 (×3): qty 1

## 2013-07-31 MED ORDER — ESTRADIOL 0.1 MG/GM VA CREA
1.0000 | TOPICAL_CREAM | VAGINAL | Status: DC
  Filled 2013-07-31: qty 42.5

## 2013-07-31 MED ORDER — GLYCOPYRROLATE 0.2 MG/ML IJ SOLN
INTRAMUSCULAR | Status: DC | PRN
  Administered 2013-07-31: 0.4 mg via INTRAVENOUS

## 2013-07-31 MED ORDER — MIDAZOLAM HCL 2 MG/2ML IJ SOLN
INTRAMUSCULAR | Status: AC
Start: 2013-07-31 — End: 2013-07-31
  Filled 2013-07-31: qty 2

## 2013-07-31 MED ORDER — RIVAROXABAN 10 MG PO TABS
10.0000 mg | ORAL_TABLET | Freq: Every day | ORAL | Status: DC
  Administered 2013-08-01: 10 mg via ORAL
  Filled 2013-07-31 (×2): qty 1

## 2013-07-31 MED ORDER — ATENOLOL 25 MG PO TABS
25.0000 mg | ORAL_TABLET | Freq: Every day | ORAL | Status: DC
  Administered 2013-07-31: 25 mg via ORAL
  Filled 2013-07-31 (×2): qty 1

## 2013-07-31 MED ORDER — METFORMIN HCL ER 500 MG PO TB24
500.0000 mg | ORAL_TABLET | Freq: Every day | ORAL | Status: DC
  Administered 2013-08-01: 500 mg via ORAL
  Filled 2013-07-31 (×2): qty 1

## 2013-07-31 MED ORDER — FENTANYL CITRATE 0.05 MG/ML IJ SOLN
25.0000 ug | INTRAMUSCULAR | Status: DC | PRN
  Administered 2013-07-31 (×3): 50 ug via INTRAVENOUS

## 2013-07-31 MED ORDER — RIVAROXABAN 10 MG PO TABS: 10.0000 mg | ORAL_TABLET | Freq: Every day | ORAL | Status: DC

## 2013-07-31 MED ORDER — KETOROLAC TROMETHAMINE 30 MG/ML IJ SOLN
INTRAMUSCULAR | Status: AC
  Administered 2013-07-31: 15 mg
  Filled 2013-07-31: qty 1

## 2013-07-31 MED ORDER — ATENOLOL 50 MG PO TABS
50.0000 mg | ORAL_TABLET | Freq: Every day | ORAL | Status: DC
  Administered 2013-08-01: 50 mg via ORAL
  Filled 2013-07-31: qty 1

## 2013-07-31 MED ORDER — NEOSTIGMINE METHYLSULFATE 1 MG/ML IJ SOLN
INTRAMUSCULAR | Status: DC | PRN
  Administered 2013-07-31: 3 mg via INTRAVENOUS

## 2013-07-31 MED ORDER — SUCCINYLCHOLINE CHLORIDE 20 MG/ML IJ SOLN
INTRAMUSCULAR | Status: DC | PRN
  Administered 2013-07-31: 100 mg via INTRAVENOUS

## 2013-07-31 MED ORDER — METHOCARBAMOL 500 MG PO TABS: 500.0000 mg | ORAL_TABLET | Freq: Four times a day (QID) | ORAL | Status: DC

## 2013-07-31 MED ORDER — FLUOXETINE HCL 20 MG PO CAPS
40.0000 mg | ORAL_CAPSULE | Freq: Every day | ORAL | Status: DC
  Administered 2013-08-01: 40 mg via ORAL
  Filled 2013-07-31: qty 2

## 2013-07-31 MED ORDER — HYDROMORPHONE HCL PF 1 MG/ML IJ SOLN
1.0000 mg | INTRAMUSCULAR | Status: DC | PRN
  Administered 2013-07-31 – 2013-08-01 (×3): 1 mg via INTRAVENOUS
  Filled 2013-07-31 (×4): qty 1

## 2013-07-31 MED ORDER — BUPIVACAINE HCL (PF) 0.25 % IJ SOLN
INTRAMUSCULAR | Status: AC
  Filled 2013-07-31: qty 30

## 2013-07-31 MED ORDER — DOCUSATE SODIUM 100 MG PO CAPS
100.0000 mg | ORAL_CAPSULE | Freq: Two times a day (BID) | ORAL | Status: DC
  Administered 2013-07-31 – 2013-08-01 (×2): 100 mg via ORAL
  Filled 2013-07-31 (×3): qty 1

## 2013-07-31 MED ORDER — MAGNESIUM CITRATE PO SOLN: 1.0000 | Freq: Once | ORAL | Status: AC | PRN

## 2013-07-31 MED ORDER — COLESEVELAM HCL 625 MG PO TABS
1875.0000 mg | ORAL_TABLET | Freq: Two times a day (BID) | ORAL | Status: DC
  Administered 2013-07-31 – 2013-08-01 (×2): 1875 mg via ORAL
  Filled 2013-07-31 (×4): qty 3

## 2013-07-31 MED ORDER — CEFAZOLIN SODIUM-DEXTROSE 2-3 GM-% IV SOLR
2.0000 g | Freq: Four times a day (QID) | INTRAVENOUS | Status: AC
  Administered 2013-07-31 (×2): 2 g via INTRAVENOUS
  Filled 2013-07-31 (×2): qty 50

## 2013-07-31 MED ORDER — ARTIFICIAL TEARS OP OINT
TOPICAL_OINTMENT | OPHTHALMIC | Status: AC
  Filled 2013-07-31: qty 3.5

## 2013-07-31 MED ORDER — LIDOCAINE HCL (CARDIAC) 20 MG/ML IV SOLN
INTRAVENOUS | Status: DC | PRN
  Administered 2013-07-31: 100 mg via INTRAVENOUS

## 2013-07-31 MED ORDER — MIDAZOLAM HCL 5 MG/5ML IJ SOLN
INTRAMUSCULAR | Status: DC | PRN
  Administered 2013-07-31: 2 mg via INTRAVENOUS

## 2013-07-31 SURGICAL SUPPLY — 64 items
BANDAGE ELASTIC 6 VELCRO ST LF (GAUZE/BANDAGES/DRESSINGS) ×2 IMPLANT
BANDAGE ESMARK 6X9 LF (GAUZE/BANDAGES/DRESSINGS) ×1 IMPLANT
BIT DRILL QUICK REL 1/8 2PK SL (DRILL) ×1 IMPLANT
BLADE SAW RECIP 87.9 MT (BLADE) IMPLANT
BNDG ESMARK 6X9 LF (GAUZE/BANDAGES/DRESSINGS) ×2
BOOTCOVER CLEANROOM LRG (PROTECTIVE WEAR) ×4 IMPLANT
BOWL SMART MIX CTS (DISPOSABLE) ×4 IMPLANT
CAPT KNEE OXFORD ×2 IMPLANT
CEMENT HV SMART SET (Cement) ×2 IMPLANT
CLSR STERI-STRIP ANTIMIC 1/2X4 (GAUZE/BANDAGES/DRESSINGS) ×4 IMPLANT
COVER SURGICAL LIGHT HANDLE (MISCELLANEOUS) ×2 IMPLANT
CUFF TOURNIQUET SINGLE 34IN LL (TOURNIQUET CUFF) IMPLANT
DERMABOND ADHESIVE PROPEN (GAUZE/BANDAGES/DRESSINGS) ×1
DERMABOND ADVANCED .7 DNX6 (GAUZE/BANDAGES/DRESSINGS) ×1 IMPLANT
DRAPE EXTREMITY T 121X128X90 (DRAPE) ×2 IMPLANT
DRAPE PROXIMA HALF (DRAPES) ×2 IMPLANT
DRAPE U-SHAPE 47X51 STRL (DRAPES) ×2 IMPLANT
DRILL QUICK RELEASE 1/8 INCH (DRILL) ×1
DRSG PAD ABDOMINAL 8X10 ST (GAUZE/BANDAGES/DRESSINGS) ×2 IMPLANT
DURAPREP 26ML APPLICATOR (WOUND CARE) ×2 IMPLANT
ELECT CAUTERY BLADE 6.4 (BLADE) ×2 IMPLANT
ELECT REM PT RETURN 9FT ADLT (ELECTROSURGICAL) ×2
ELECTRODE REM PT RTRN 9FT ADLT (ELECTROSURGICAL) ×1 IMPLANT
EVACUATOR 1/8 PVC DRAIN (DRAIN) IMPLANT
FACESHIELD WRAPAROUND (MASK) ×2 IMPLANT
GLOVE BIOGEL M 7.0 STRL (GLOVE) ×2 IMPLANT
GLOVE BIOGEL PI IND STRL 7.5 (GLOVE) ×1 IMPLANT
GLOVE BIOGEL PI INDICATOR 7.5 (GLOVE) ×1
GLOVE BIOGEL PI ORTHO PRO SZ8 (GLOVE) ×2
GLOVE ORTHO TXT STRL SZ7.5 (GLOVE) ×2 IMPLANT
GLOVE PI ORTHO PRO STRL SZ8 (GLOVE) ×2 IMPLANT
GLOVE SURG ORTHO 8.0 STRL STRW (GLOVE) ×4 IMPLANT
GOWN STRL REUS W/ TWL LRG LVL3 (GOWN DISPOSABLE) IMPLANT
GOWN STRL REUS W/TWL LRG LVL3 (GOWN DISPOSABLE)
HANDPIECE INTERPULSE COAX TIP (DISPOSABLE) ×1
HOOD PEEL AWAY FACE SHEILD DIS (HOOD) ×6 IMPLANT
KIT BASIN OR (CUSTOM PROCEDURE TRAY) ×2 IMPLANT
KIT ROOM TURNOVER OR (KITS) ×2 IMPLANT
KIT SAW BLADE (KITS) ×2 IMPLANT
MANIFOLD NEPTUNE II (INSTRUMENTS) ×2 IMPLANT
NS IRRIG 1000ML POUR BTL (IV SOLUTION) ×2 IMPLANT
PACK TOTAL JOINT (CUSTOM PROCEDURE TRAY) ×2 IMPLANT
PAD ABD 8X10 STRL (GAUZE/BANDAGES/DRESSINGS) ×2 IMPLANT
PAD ARMBOARD 7.5X6 YLW CONV (MISCELLANEOUS) ×4 IMPLANT
PAD CAST 4YDX4 CTTN HI CHSV (CAST SUPPLIES) ×1 IMPLANT
PADDING CAST COTTON 4X4 STRL (CAST SUPPLIES) ×1
PADDING CAST COTTON 6X4 STRL (CAST SUPPLIES) ×2 IMPLANT
RUBBERBAND STERILE (MISCELLANEOUS) ×2 IMPLANT
SET HNDPC FAN SPRY TIP SCT (DISPOSABLE) ×1 IMPLANT
SPONGE GAUZE 4X4 12PLY (GAUZE/BANDAGES/DRESSINGS) ×2 IMPLANT
SPONGE GAUZE 4X4 12PLY STER LF (GAUZE/BANDAGES/DRESSINGS) ×2 IMPLANT
STAPLER VISISTAT 35W (STAPLE) ×2 IMPLANT
SUCTION FRAZIER TIP 10 FR DISP (SUCTIONS) ×2 IMPLANT
SUT MNCRL AB 4-0 PS2 18 (SUTURE) ×2 IMPLANT
SUT VIC AB 0 CT1 27 (SUTURE) ×2
SUT VIC AB 0 CT1 27XBRD ANBCTR (SUTURE) ×2 IMPLANT
SUT VIC AB 1 CT1 27 (SUTURE) ×2
SUT VIC AB 1 CT1 27XBRD ANBCTR (SUTURE) ×2 IMPLANT
SUT VIC AB 3-0 SH 8-18 (SUTURE) ×2 IMPLANT
SYR 30ML LL (SYRINGE) ×2 IMPLANT
TOWEL OR 17X24 6PK STRL BLUE (TOWEL DISPOSABLE) ×2 IMPLANT
TOWEL OR 17X26 10 PK STRL BLUE (TOWEL DISPOSABLE) ×2 IMPLANT
TRAY FOLEY CATH 16FRSI W/METER (SET/KITS/TRAYS/PACK) IMPLANT
WATER STERILE IRR 1000ML POUR (IV SOLUTION) ×6 IMPLANT

## 2013-07-31 NOTE — Evaluation (Signed)
Physical Therapy Evaluation Patient Details Name: Kara Lee MRN: [id number] DOB: 19-Apr-1946 Today's Date: 07/31/2013   History of Present Illness  68 y/o female s/p  RIGHT UNICOMPARTMENTAL KNEE  Clinical Impression  Pt adm with the above dx. Pt performed functional mobility in a safe manner and was limited to pain and fatigue. Pt demonstrated great tolerance to WB allowing amb in safe manner on this date. Pt expresses concern with ability to negotiate stairs at home to reach bedroom and bathroom on second floor. Pt to benefit from HHPT and supervision to maximize functional independence upon d/c. Pt to benefit from acute skilled PT to address limitations listed below.     Follow Up Recommendations Home health PT;Supervision/Assistance - 24 hour (transition to Outpatient PT if necessary )    Equipment Recommendations  Rolling walker with 5" wheels;3in1 (PT)    Recommendations for Other Services       Precautions / Restrictions Precautions Precautions: Fall Required Braces or Orthoses: Knee Immobilizer - Right Knee Immobilizer - Right: On when out of bed or walking Restrictions Weight Bearing Restrictions: Yes RLE Weight Bearing: Weight bearing as tolerated      Mobility  Bed Mobility Overal bed mobility: Needs Assistance Bed Mobility: Supine to Sit     Supine to sit: Min guard     General bed mobility comments: verbal cues for hand placement, safety, and mobility sequencing    Transfers Overall transfer level: Needs assistance Equipment used: Rolling walker (2 wheeled) Transfers: Sit to/from Stand Sit to Stand: Min assist         General transfer comment: pt grabbed front of RW instead of handle upon standing trasfer. verbal and tactile cues req to correct hand placement and for safety. pt req RW to complete transfer. req verbal cues for sequencing. pt sat back down after standing for 15 seconds due to LE fatigue and unsteadiness. pt able to maintain upright  standing position on 2nd attempt to continue with therapy. pt tolerated WB on R LE well   Ambulation/Gait Ambulation/Gait assistance: Min assist Ambulation Distance (Feet): 60 Feet Assistive device: Rolling walker (2 wheeled) Gait Pattern/deviations: Step-to pattern;Decreased stride length;Decreased weight shift to right;Wide base of support Gait velocity: slow; guarded for pain and cautious for falls   General Gait Details: pt req verbal instruction for step sequencing and proper RW safety. pt req RW for Bilat UE support to complete amb task. pt req min A to encourage straight direction and for support. pt did not display LOB. pt amb distance limited to fatigue   Stairs            Wheelchair Mobility    Modified Rankin (Stroke Patients Only)       Balance Overall balance assessment: Needs assistance Sitting-balance support: No upper extremity supported;Feet supported Sitting balance-Leahy Scale: Fair Sitting balance - Comments: pt tolerated sitting EOB for a total time of approximately 5 min   Standing balance support: Bilateral upper extremity supported;During functional activity Standing balance-Leahy Scale: Poor Standing balance comment: pt req RW for Bilat UE support in standing and for marching in place. pt req to sit down due to fatigue after 15 sec of initial sit to stand transfer. pt tolerate WB through R LE well on this date                             Pertinent Vitals/Pain Pt reports knee pain as 4/10.     Home Living  Family/patient expects to be discharged to:: Private residence Living Arrangements: Spouse/significant other Available Help at Discharge: Family Type of Home: House Home Access: Stairs to enter Entrance Stairs-Rails: Can reach both Entrance Stairs-Number of Steps: 3 Home Layout: Two level [place] - single point Additional Comments: bedroom and bathroom is located upstairs. If pt cannot negotiate stairs a BSC will be  needed for downstair toileting.     Prior Function Level of Independence: Independent with assistive device(s)         Comments: ADLs limited by knee pain. pt reports using cane     Hand Dominance        Extremity/Trunk Assessment   Upper Extremity Assessment: Overall WFL for tasks assessed           Lower Extremity Assessment: RLE deficits/detail RLE Deficits / Details: pt reports knee pain at 4/10 sitting EOB. AROM extension lacks 8 degrees. pt able to achieve 100 degrees AROM knee flexion and 105 AAROM knee flexion.        Communication   Communication: No difficulties  Cognition Arousal/Alertness: Lethargic;Suspect due to medications Behavior During Therapy: [facility] for tasks assessed/performed;Flat affect Overall Cognitive Status: Within Functional Limits for tasks assessed                      General Comments      Exercises Total Joint Exercises Ankle Circles/Pumps: AROM;Both;10 reps;Supine Quad Sets: Strengthening;10 reps;Right;Supine Heel Slides: AAROM;Right;10 reps;Supine      Assessment/Plan    PT Assessment Patient needs continued PT services  PT Diagnosis Difficulty walking;Generalized weakness;Acute pain   PT Problem List Decreased strength;Decreased range of motion;Decreased activity tolerance;Decreased balance;Decreased mobility;Decreased coordination;Decreased knowledge of use of DME;Decreased safety awareness;Pain  PT Treatment Interventions DME instruction;Gait training;Stair training;Functional mobility training;Therapeutic activities;Therapeutic exercise;Balance training;Patient/family education   PT Goals (Current goals can be found in the Care Plan section) Acute Rehab PT Goals Patient Stated Goal: return home PT Goal Formulation: With patient/family Time For Goal Achievement: 08/07/13 Potential to Achieve Goals: Good    Frequency 7X/week   Barriers to discharge        Co-evaluation               End of Session  Equipment Utilized During Treatment: Gait belt;Right knee immobilizer (RW. knee immobilizer during OOB activities) Activity Tolerance: Patient tolerated treatment well;Patient limited by fatigue Patient left: in chair;with call bell/phone within reach;with family/visitor present Nurse Communication: Mobility status         Time: 8502-7741 PT Time Calculation (min): 42 min   Charges:         PT G Codes:          Manley Mason SPT 07/31/2013, 3:04 PM  Agree with above assessment.  Kittie Plater, PT, DPT Pager #: [phone] Office #: [phone]

## 2013-07-31 NOTE — Progress Notes (Signed)
Utilization review completed.  

## 2013-07-31 NOTE — Op Note (Signed)
07/31/2013  10:10 AM  PATIENT:  Kara Lee    PRE-OPERATIVE DIAGNOSIS:  DJD RIGHT KNEE  POST-OPERATIVE DIAGNOSIS:  Same  PROCEDURE:  RIGHT UNICOMPARTMENTAL KNEE  SURGEON:  Johnny Bridge, MD  PHYSICIAN ASSISTANT: Joya Gaskins, OPA-C, present and scrubbed throughout the case, critical for completion in a timely fashion, and for retraction, instrumentation, and closure.  ANESTHESIA:   General.  We discussed at length the possibility of doing this under spinal anesthetic, however Dr. Oletta Lamas was not comfortable do to the presence of aortic stenosis, and so regional anesthesia was contraindicated, and we did her surgery under general anesthetic. I did use Exparel and Marcaine at completion of the case for postoperative pain control augmentation.  PREOPERATIVE INDICATIONS:  Kara Lee is a  68 y.o. female with a diagnosis of DJD RIGHT KNEE who failed conservative measures and elected for surgical management.    The risks benefits and alternatives were discussed with the patient preoperatively including but not limited to the risks of infection, bleeding, nerve injury, cardiopulmonary complications, blood clots, the need for revision surgery, among others, and the patient was willing to proceed.  OPERATIVE IMPLANTS: Biomet [place] mobile bearing medial compartment arthroplasty femur size small, tibia size B, bearing size 4.  OPERATIVE FINDINGS: Endstage grade 4 medial compartment osteoarthritis. No significant changes in the lateral or patellofemoral joint.  The ACL was intact.  OPERATIVE PROCEDURE: The patient was brought to the operating room placed in supine position. General anesthesia was administered. IV antibiotics were given. The lower extremity was placed in the legholder and prepped and draped in usual sterile fashion.  Time out was performed.  The leg was elevated and exsanguinated and the tourniquet was inflated. Anteromedial incision was performed, and I took care to  preserve the MCL. Parapatellar incision was carried out, and the osteophytes were excised, along with the medial meniscus and a small portion of the fat pad.  The extra medullary tibial cutting jig was applied, using the spoon and the 76mm G-Clamp with the 2 mm shim, and I took care to protect the anterior cruciate ligament insertion and the tibial spine. The medial collateral ligament was also protected, and I resected my proximal tibia, matching the anatomic slope.   The proximal tibial bony cut was removed in one piece, and I turned my attention to the femur.  The intramedullary femoral rod was placed using the drill, and then using the appropriate reference, I assembled the femoral jig, setting my posterior cutting block. I resected my posterior femur, used the 0 spigot for the anterior femur, and then measured my gap.   I then used the appropriate mill to match the extension gap to the flexion gap. The flexion gap was 4 mm, and the extension gap was 0 mm, and so I used a 4 mm Spigot.The gaps were then measured again with the appropriate feeler gauges. Once I had balanced flexion and extension gaps, I then completed the preparation of the femur.  I milled off the anterior aspect of the distal femur to prevent impingement. I also exposed the tibia, and selected the above-named component, and then used the cutting jig to prepare the keel slot on the tibia. I also used the awl to curette out the bone to complete the preparation of the keel. The back wall was intact.  I then placed trial components, and it was found to have excellent motion, and appropriate balance.  I then cemented the components into place, cementing the tibia first,  removing all excess cement, and then cementing the femur.  All loose cement was removed.  The real polyethylene insert was applied manually, and the knee was taken through functional range of motion, and found to have excellent stability and restoration of joint motion,  with excellent balance.  The wounds were irrigated copiously, and the parapatellar tissue closed with Vicryl, followed by Vicryl for the subcutaneous tissue, with Monocryl for the skin followed by Dermabond, do to her allergy to Steri-Strips.I also injected the Exparel and marcaine into the capsule and subcutaneous tissue  The tourniquet was released, and the patient was awakened and extubated and returned to PACU in stable and satisfactory condition. There were no complications.

## 2013-07-31 NOTE — H&P (Signed)
PREOPERATIVE H&P  Chief Complaint: DJD RIGHT KNEE  HPI: Kara Lee is a 68 y.o. female who presents for preoperative history and physical with a diagnosis of DJD RIGHT KNEE. Symptoms are rated as moderate to severe, and have been worsening.  This is significantly impairing activities of daily living.  She has elected for surgical management. Failed injections, nsaids, activity mod.  Past Medical History  Diagnosis Date  . Bicuspid aortic valve   . Tachycardia   . Complication of anesthesia     Difficult time waking up; prefers spinal  . PONV (postoperative nausea and vomiting)   . Family history of anesthesia complication     Makes son extremely hyper  . Dysrhythmia     Tachycardia  . Heart murmur   . Concussion November 09, 2012    From a fall at home on deck  . GERD (gastroesophageal reflux disease)   . Arthritis   . Diabetes mellitus without complication   . Dry skin   . Sleep apnea     CPAP wears nightly  . Shortness of breath     with exertion  . Pneumonia 2015  . Depression     postpartum depression after 2nd child  . Headache(784.0)     since having a concussion  . Fatty liver   . Rosacea   . Sensitive skin    Past Surgical History  Procedure Laterality Date  . Biopsy breast Bilateral   . Eye surgery      lasik; X3 for crossed eyes; 1 eyelid surgery  . Ankle reconstruction Left   . Hernia repair    . Cholecystectomy    . Abdominal hysterectomy      still has uterus  . Colonoscopy    . Tubal ligation    . Cesarean section      x 2   History   Social History  . Marital Status: Married    Spouse Name: N/A    Number of Children: N/A  . Years of Education: N/A   Social History Main Topics  . Smoking status: Never Smoker   . Smokeless tobacco: Never Used  . Alcohol Use: Yes     Comment: rare  . Drug Use: No  . Sexual Activity: None   Other Topics Concern  . None   Social History Narrative  . None   Family History  Problem Relation Age of  Onset  . Pancreatitis Mother   . COPD Mother   . Cancer - Lung Mother   . Diabetes type II Mother    Allergies  Allergen Reactions  . Latex Itching and Swelling  . Lovenox [Enoxaparin Sodium] Other (See Comments)  . Minocycline Nausea And Vomiting  . Morphine And Related Nausea And Vomiting  . Nickel Other (See Comments)    Contact dermatitis   . Shellfish Allergy Itching  . Statins     Muscle aches  . Sulfa Antibiotics Nausea And Vomiting  . Tape Itching    Bandaids and adhesive tape   Prior to Admission medications   Medication Sig Start Date End Date Taking? Authorizing Provider  acetaminophen (TYLENOL) 500 MG tablet Take 500-1,000 mg by mouth every 6 (six) hours as needed for mild pain.   Yes Historical Provider, MD  atenolol (TENORMIN) 25 MG tablet Take 25-50 mg by mouth 2 (two) times daily. 50mg  in the morning and 25mg  in the evening   Yes Historical Provider, MD  clonazePAM (KLONOPIN) 0.5 MG tablet Take 0.5 mg by  mouth 2 (two) times daily.   Yes Historical Provider, MD  colesevelam (WELCHOL) 625 MG tablet Take 1,875 mg by mouth 2 (two) times daily with a meal.   Yes Historical Provider, MD  estradiol (ESTRACE) 0.1 MG/GM vaginal cream Place 1 Applicatorful vaginally once a week.    Yes Historical Provider, MD  FLUoxetine (PROZAC) 40 MG capsule Take 40 mg by mouth daily.   Yes Historical Provider, MD  ibuprofen (ADVIL,MOTRIN) 200 MG tablet Take 200 mg by mouth every 6 (six) hours as needed.   Yes Historical Provider, MD  metFORMIN (GLUMETZA) 500 MG (MOD) 24 hr tablet Take 500 mg by mouth daily with breakfast.   Yes Historical Provider, MD  Multiple Vitamin (MULTIVITAMIN) capsule Take 1 capsule by mouth daily.   Yes Historical Provider, MD  pantoprazole (PROTONIX) 40 MG tablet Take 40 mg by mouth daily.   Yes Historical Provider, MD  Polyethyl Glycol-Propyl Glycol (SYSTANE OP) Place 1-2 drops into both eyes daily as needed (dry eyes).   Yes Historical Provider, MD  vitamin C  (ASCORBIC ACID) 500 MG tablet Take 1,000 mg by mouth daily as needed (around sick people).   Yes Historical Provider, MD     Positive ROS: All other systems have been reviewed and were otherwise negative with the exception of those mentioned in the HPI and as above.  Physical Exam: General: Alert, no acute distress Cardiovascular: No pedal edema Respiratory: No cyanosis, no use of accessory musculature GI: No organomegaly, abdomen is soft and non-tender Skin: No lesions in the area of chief complaint Neurologic: Sensation intact distally Psychiatric: Patient is competent for consent with normal mood and affect Lymphatic: No axillary or cervical lymphadenopathy  MUSCULOSKELETAL: right knee with varus and medial pain, positive crepitance, 0-120rom   Assessment: DJD RIGHT KNEE  Plan: Plan for Procedure(s): RIGHT UNICOMPARTMENTAL KNEE  The risks benefits and alternatives were discussed with the patient including but not limited to the risks of nonoperative treatment, versus surgical intervention including infection, bleeding, nerve injury,  blood clots, cardiopulmonary complications, morbidity, mortality, among others, and they were willing to proceed.   Johnny Bridge, MD Cell [phone]   07/31/2013 7:54 AM

## 2013-07-31 NOTE — Evaluation (Signed)
Physical Therapy Evaluation Patient Details Name: Kara Lee MRN: [id number] DOB: 1945-07-02 Today's Date: 07/31/2013   History of Present Illness  68 y/o female s/p  RIGHT UNICOMPARTMENTAL KNEE  Clinical Impression  Pt adm with the above dx. Pt performed functional mobility in a safe manner and was limited to pain and fatigue. Pt demonstrated great tolerance to WB allowing amb in safe manner on this date. Pt expresses concern with ability to negotiate stairs at home to reach bedroom and bathroom on second floor. Pt to benefit from HHPT and supervision to maximize functional independence upon d/c. Pt to benefit from acute skilled PT to address limitations listed below.     Follow Up Recommendations Home health PT;Supervision/Assistance - 24 hour (transition to Outpatient PT if necessary )    Equipment Recommendations  Rolling walker with 5" wheels;3in1 (PT)    Recommendations for Other Services       Precautions / Restrictions Precautions Precautions: Fall Required Braces or Orthoses: Knee Immobilizer - Right Knee Immobilizer - Right: On when out of bed or walking Restrictions Weight Bearing Restrictions: Yes RLE Weight Bearing: Weight bearing as tolerated      Mobility  Bed Mobility Overal bed mobility: Needs Assistance Bed Mobility: Supine to Sit     Supine to sit: Min guard     General bed mobility comments: verbal cues for hand placement, safety, and mobility sequencing    Transfers Overall transfer level: Needs assistance Equipment used: Rolling walker (2 wheeled) Transfers: Sit to/from Stand Sit to Stand: Min assist         General transfer comment: pt grabbed front of RW instead of handle upon standing trasfer. verbal and tactile cues req to correct hand placement and for safety. pt req RW to complete transfer. req verbal cues for sequencing. pt sat back down after standing for 15 seconds due to LE fatigue and unsteadiness. pt able to maintain upright  standing position on 2nd attempt to continue with therapy. pt tolerated WB on R LE well   Ambulation/Gait Ambulation/Gait assistance: Min assist Ambulation Distance (Feet): 60 Feet Assistive device: Rolling walker (2 wheeled) Gait Pattern/deviations: Step-to pattern;Decreased stride length;Decreased weight shift to right;Wide base of support Gait velocity: slow; guarded for pain and cautious for falls   General Gait Details: pt req verbal instruction for step sequencing and proper RW safety. pt req RW for Bilat UE support to complete amb task. pt req min A to encourage straight direction and for support. pt did not display LOB. pt amb distance limited to fatigue   Stairs            Wheelchair Mobility    Modified Rankin (Stroke Patients Only)       Balance Overall balance assessment: Needs assistance Sitting-balance support: No upper extremity supported;Feet supported Sitting balance-Leahy Scale: Fair Sitting balance - Comments: pt tolerated sitting EOB for a total time of approximately 5 min   Standing balance support: Bilateral upper extremity supported;During functional activity Standing balance-Leahy Scale: Poor Standing balance comment: pt req RW for Bilat UE support in standing and for marching in place. pt req to sit down due to fatigue after 15 sec of initial sit to stand transfer. pt tolerate WB through R LE well on this date                             Pertinent Vitals/Pain Pt reports knee pain as 4/10.     Home Living  Family/patient expects to be discharged to:: Private residence Living Arrangements: Spouse/significant other Available Help at Discharge: Family Type of Home: House Home Access: Stairs to enter Entrance Stairs-Rails: Can reach both Entrance Stairs-Number of Steps: 3 Home Layout: Two level [place] - single point Additional Comments: bedroom and bathroom is located upstairs. If pt cannot negotiate stairs a BSC will be  needed for downstair toileting.     Prior Function Level of Independence: Independent with assistive device(s)         Comments: ADLs limited by knee pain. pt reports using cane     Hand Dominance        Extremity/Trunk Assessment   Upper Extremity Assessment: Overall WFL for tasks assessed           Lower Extremity Assessment: RLE deficits/detail RLE Deficits / Details: pt reports knee pain at 4/10 sitting EOB. AROM extension lacks 8 degrees. pt able to achieve 100 degrees AROM knee flexion and 105 AAROM knee flexion.        Communication   Communication: No difficulties  Cognition Arousal/Alertness: Lethargic;Suspect due to medications Behavior During Therapy: [facility] for tasks assessed/performed;Flat affect Overall Cognitive Status: Within Functional Limits for tasks assessed                      General Comments      Exercises Total Joint Exercises Ankle Circles/Pumps: AROM;Both;10 reps;Supine Quad Sets: Strengthening;10 reps;Right;Supine Heel Slides: AAROM;Right;10 reps;Supine      Assessment/Plan    PT Assessment Patient needs continued PT services  PT Diagnosis Difficulty walking;Generalized weakness;Acute pain   PT Problem List Decreased strength;Decreased range of motion;Decreased activity tolerance;Decreased balance;Decreased mobility;Decreased coordination;Decreased knowledge of use of DME;Decreased safety awareness;Pain  PT Treatment Interventions DME instruction;Gait training;Stair training;Functional mobility training;Therapeutic activities;Therapeutic exercise;Balance training;Patient/family education   PT Goals (Current goals can be found in the Care Plan section) Acute Rehab PT Goals Patient Stated Goal: return home PT Goal Formulation: With patient/family Time For Goal Achievement: 08/07/13 Potential to Achieve Goals: Good    Frequency 7X/week   Barriers to discharge        Co-evaluation               End of Session  Equipment Utilized During Treatment: Gait belt;Right knee immobilizer (RW. knee immobilizer during OOB activities) Activity Tolerance: Patient tolerated treatment well;Patient limited by fatigue Patient left: in chair;with call bell/phone within reach;with family/visitor present Nurse Communication: Mobility status         Time: 8413-2440 PT Time Calculation (min): 42 min   Charges:         PT G Codes:          Manley Mason SPT 07/31/2013, 3:04 PM

## 2013-07-31 NOTE — Discharge Instructions (Signed)
Diet: As you were doing prior to hospitalization   Shower:  May shower but keep the wounds dry, use an occlusive plastic wrap, NO SOAKING IN TUB.  If the bandage gets wet, change with a clean dry gauze.  Dressing:  You may change your dressing 3-5 days after surgery.  Then change the dressing daily with sterile gauze dressing.    There are sticky tapes (steri-strips) on your wounds and all the stitches are absorbable.  Leave the steri-strips in place when changing your dressings, they will peel off with time, usually 2-3 weeks.  Activity:  Increase activity slowly as tolerated, but follow the weight bearing instructions below.  No lifting or driving for 6 weeks.  Weight Bearing:   As tolerated.    To prevent constipation: you may use a stool softener such as -  Colace (over the counter) 100 mg by mouth twice a day  Drink plenty of fluids (prune juice may be helpful) and high fiber foods Miralax (over the counter) for constipation as needed.    Itching:  If you experience itching with your medications, try taking only a single pain pill, or even half a pain pill at a time.  You may take up to 10 pain pills per day, and you can also use benadryl over the counter for itching or also to help with sleep.   Precautions:  If you experience chest pain or shortness of breath - call 911 immediately for transfer to the hospital emergency department!!  If you develop a fever greater that 101 F, purulent drainage from wound, increased redness or drainage from wound, or calf pain -- Call the office at [phone]                                                Follow- Up Appointment:  Please call for an appointment to be seen in 2 weeks [facility] - [phone]  ---------------------------------------------------------------------------------------------------------------- Information on my medicine - XARELTO (Rivaroxaban)  This medication education was reviewed with me or my healthcare  representative as part of my discharge preparation.  The pharmacist that spoke with me during my hospital stay was:  Verna Czech, [place]  Why was Xarelto prescribed for you? Xarelto was prescribed for you to reduce the risk of blood clots forming after orthopedic surgery. The medical term for these abnormal blood clots is venous thromboembolism (VTE).  What do you need to know about xarelto ? Take your Xarelto ONCE DAILY at the same time every day. You may take it either with or without food.  If you have difficulty swallowing the tablet whole, you may crush it and mix in applesauce just prior to taking your dose.  Take Xarelto exactly as prescribed by your doctor and DO NOT stop taking Xarelto without talking to the doctor who prescribed the medication.  Stopping without other VTE prevention medication to take the place of Xarelto may increase your risk of developing a clot.  After discharge, you should have regular check-up appointments with your healthcare provider that is prescribing your Xarelto.    What do you do if you miss a dose? If you miss a dose, take it as soon as you remember on the same day then continue your regularly scheduled once daily regimen the next day. Do not take two doses of Xarelto on the same day.   Important Safety  Information A possible side effect of Xarelto is bleeding. You should call your healthcare provider right away if you experience any of the following:   Bleeding from an injury or your nose that does not stop.   Unusual colored urine (red or dark brown) or unusual colored stools (red or black).   Unusual bruising for unknown reasons.   A serious fall or if you hit your head (even if there is no bleeding).  Some medicines may interact with Xarelto and might increase your risk of bleeding while on Xarelto. To help avoid this, consult your healthcare provider or pharmacist prior to using any new prescription or non-prescription medications,  including herbals, vitamins, non-steroidal anti-inflammatory drugs (NSAIDs) and supplements.  This website has more information on Xarelto: [email or web address]

## 2013-07-31 NOTE — Transfer of Care (Signed)
Immediate Anesthesia Transfer of Care Note  Patient: Kara Lee  Procedure(s) Performed: Procedure(s): RIGHT UNICOMPARTMENTAL KNEE (Right)  Patient Location: PACU  Anesthesia Type:General  Level of Consciousness: awake, alert  and oriented  Airway & Oxygen Therapy: Patient Spontanous Breathing and Patient connected to nasal cannula oxygen  Post-op Assessment: Report given to PACU RN and Post -op Vital signs reviewed and stable  Post vital signs: Reviewed and stable  Complications: No apparent anesthesia complications

## 2013-07-31 NOTE — Progress Notes (Signed)
Pt states that she is not able to take shots in the middle of her abd down.  She is asking if she will need blood thinners and if so not to give the shots in this area.  She got an infection last time and has mesh in this area.  Note left on chart and also encouraged her to tell the nurse on the floor.

## 2013-07-31 NOTE — Progress Notes (Signed)
Orthopedic Tech Progress Note Patient Details:  Kara Lee 27-Oct-1945 [id number]  Patient ID: [name], female   DOB: 12/25/1945, 68 y.o.   MRN: [id number]   [name] 07/31/2013, 2:33 PM Trapeze bar

## 2013-07-31 NOTE — Anesthesia Preprocedure Evaluation (Addendum)
Anesthesia Evaluation  Patient identified by MRN, date of birth, ID band Patient awake    Reviewed: Allergy & Precautions, H&P , NPO status , Patient's Chart, lab work & pertinent test results  History of Anesthesia Complications (+) PONV  Airway Mallampati: II      Dental   Pulmonary shortness of breath, sleep apnea , pneumonia -,  breath sounds clear to auscultation        Cardiovascular Rhythm:Regular Rate:Normal     Neuro/Psych    GI/Hepatic Neg liver ROS, GERD-  ,  Endo/Other  diabetes  Renal/GU negative Renal ROS     Musculoskeletal   Abdominal   Peds  Hematology   Anesthesia Other Findings   Reproductive/Obstetrics                         Anesthesia Physical Anesthesia Plan  ASA: III  Anesthesia Plan: General   Post-op Pain Management:    Induction: Intravenous  Airway Management Planned: Oral ETT  Additional Equipment:   Intra-op Plan:   Post-operative Plan: Extubation in OR  Informed Consent:   Dental advisory given  Plan Discussed with: CRNA, Anesthesiologist and Surgeon  Anesthesia Plan Comments:        Anesthesia Quick Evaluation

## 2013-08-01 ENCOUNTER — Encounter (HOSPITAL_COMMUNITY): Payer: Self-pay | Admitting: Orthopedic Surgery

## 2013-08-01 LAB — GLUCOSE, CAPILLARY
Glucose-Capillary: 131 mg/dL — ABNORMAL HIGH (ref 70–99)
Glucose-Capillary: 112 mg/dL — ABNORMAL HIGH (ref 70–99)

## 2013-08-01 LAB — CBC
HCT: 35.3 % — ABNORMAL LOW (ref 36.0–46.0)
Hemoglobin: 11.3 g/dL — ABNORMAL LOW (ref 12.0–15.0)
MCH: 29.7 pg (ref 26.0–34.0)
MCHC: 32 g/dL (ref 30.0–36.0)
MCV: 92.9 fL (ref 78.0–100.0)
Platelets: 190 10*3/uL (ref 150–400)
RBC: 3.8 MIL/uL — ABNORMAL LOW (ref 3.87–5.11)
RDW: 14 % (ref 11.5–15.5)
WBC: 11.6 10*3/uL — ABNORMAL HIGH (ref 4.0–10.5)

## 2013-08-01 LAB — BASIC METABOLIC PANEL
BUN: 10 mg/dL (ref 6–23)
CO2: 23 mEq/L (ref 19–32)
Calcium: 8.3 mg/dL — ABNORMAL LOW (ref 8.4–10.5)
Chloride: 101 mEq/L (ref 96–112)
Creatinine, Ser: 0.74 mg/dL (ref 0.50–1.10)
GFR calc Af Amer: >90 mL/min (ref >90)
GFR calc non Af Amer: 87 mL/min — ABNORMAL LOW (ref >90)
Glucose, Bld: 112 mg/dL — ABNORMAL HIGH (ref 70–99)
Potassium: 4 mEq/L (ref 3.7–5.3)
Sodium: 137 mEq/L (ref 137–147)

## 2013-08-01 MED ORDER — HYDROMORPHONE HCL 2 MG PO TABS: 2.0000 mg | ORAL_TABLET | ORAL | Status: DC | PRN

## 2013-08-01 NOTE — Care Management Note (Signed)
CARE MANAGEMENT NOTE 08/01/2013  Patient:  Kara Lee, Kara Lee   Account Number:  [id number]  Date Initiated:  08/01/2013  Documentation initiated by:  Ricki Miller  Subjective/Objective Assessment:   68 yr old female s/p right total knee unicompartmental knee     Action/Plan:   patient preoperatively setup with [facility], no changes.   Anticipated DC Date:  08/01/2013   Anticipated DC Plan:  [place]  CM consult      [facility] Choice  HOME HEALTH  DURABLE MEDICAL EQUIPMENT   Choice offered to / List presented to:     DME arranged  [place]  3-N-1      DME agency  [employer]     [place] arranged  HH-2 PT      [place]   Status of service:  Completed, signed off Medicare Important Message given?   (If response is "NO", the following Medicare IM given date fields will be blank) Date Medicare IM given:   Date Additional Medicare IM given:    Discharge Disposition:  [place]  Per UR Regulation:    If discussed at Long Length of Stay Meetings, dates discussed:    Comments:

## 2013-08-01 NOTE — Progress Notes (Signed)
Patient discharged to home accompanied by husband. Discharge instructions and rx given and explained and patient stated understanding. IV was removed and patient left unit in a stable condition with all equipment and personal belongings via wheelchair.

## 2013-08-01 NOTE — Progress Notes (Signed)
Physical Therapy Treatment Patient Details Name: Kara Lee MRN: [id number] DOB: 11-04-1945 Today's Date: 08/01/2013    History of Present Illness 68 y/o female s/p  RIGHT UNICOMPARTMENTAL KNEE    PT Comments    Pt progressing slowly due to lethargy from medications. Pt did not perform stairs today due to lethargy/ slow processing/ decreased safety from meds. Ambulated 37' with RW and min A. PT will continue to follow.   Follow Up Recommendations  Home health PT;Supervision/Assistance - 24 hour     Equipment Recommendations  Rolling walker with 5" wheels;3in1 (PT)    Recommendations for Other Services       Precautions / Restrictions Precautions Precautions: Fall Required Braces or Orthoses: Knee Immobilizer - Right Restrictions Weight Bearing Restrictions: Yes RLE Weight Bearing: Weight bearing as tolerated Other Position/Activity Restrictions: used KI today due to pt medicated and without good quad press    Mobility  Bed Mobility Overal bed mobility: Needs Assistance Bed Mobility: Supine to Sit     Supine to sit: Min assist     General bed mobility comments: needed min A for moving RLE to EOB due to pain  Transfers Overall transfer level: Needs assistance Equipment used: Rolling walker (2 wheeled) Transfers: Sit to/from Stand Sit to Stand: Min assist Stand pivot transfers: Min assist       General transfer comment: pt placed hands safely but min A required for steadying as pt very lethargic  Ambulation/Gait Ambulation/Gait assistance: Min assist Ambulation Distance (Feet): 80 Feet Assistive device: Rolling walker (2 wheeled) Gait Pattern/deviations: Step-to pattern;Decreased step length - left;Decreased step length - right;Decreased stance time - right;Decreased weight shift to right Gait velocity: very slow   General Gait Details: frequent cues to keep eyes open and to stay close to [employer]:  (not performed due to lethargy)           Wheelchair Mobility    Modified Rankin (Stroke Patients Only)       Balance Overall balance assessment: Needs assistance Sitting-balance support: No upper extremity supported;Feet supported Sitting balance-Leahy Scale: Good     Standing balance support: Bilateral upper extremity supported;During functional activity Standing balance-Leahy Scale: Poor Standing balance comment: UE support required                    Cognition Arousal/Alertness: Lethargic;Suspect due to medications Behavior During Therapy: Flat affect Overall Cognitive Status: Impaired/Different from baseline Area of Impairment: Problem solving     Memory: Decreased recall of precautions       Problem Solving: Slow processing;Requires verbal cues (due to meds) General Comments: pt with pain meds and benadryl decreasing cognition and inhibiting progress    Exercises Total Joint Exercises Ankle Circles/Pumps: AROM;Both;10 reps;Supine Quad Sets: Strengthening;10 reps;Right;Supine Heel Slides: AROM;Right;10 reps;Seated Straight Leg Raises: AAROM;Right;10 reps;Supine Long Arc Quad: AROM;Right;10 reps;Seated    General Comments        Pertinent Vitals/Pain Faces 6/10, premedicated    Home Living Family/patient expects to be discharged to:: Private residence Living Arrangements: Spouse/significant other Available Help at Discharge: Family Type of Home: House Home Access: Stairs to enter Entrance Stairs-Rails: Can reach both Home Layout: Two level Home Equipment: Toilet riser Additional Comments: bedroom and bathroom is located upstairs. If pt cannot negotiate stairs a BSC will be needed for downstair toileting.     Prior Function Level of Independence: Independent with assistive device(s)      Comments: ADLs limited by knee pain. pt reports using cane  PT Goals (current goals can now be found in the care plan section) Acute Rehab PT Goals Patient Stated Goal: return home PT Goal  Formulation: With patient/family Time For Goal Achievement: 08/07/13 Potential to Achieve Goals: Good Progress towards PT goals: Progressing toward goals    Frequency  7X/week    PT Plan Current plan remains appropriate    Co-evaluation             End of Session Equipment Utilized During Treatment: Gait belt;Right knee immobilizer Activity Tolerance: Patient limited by lethargy;Patient limited by pain Patient left: in bed;with call bell/phone within reach;with family/visitor present     Time: 1200-1225 PT Time Calculation (min): 25 min  Charges:  $Gait Training: 8-22 mins $Therapeutic Activity: 8-22 mins                    G Codes:     Leighton Roach, PT  Acute Rehab Services  [place] 08/01/2013, 2:07 PM

## 2013-08-01 NOTE — Progress Notes (Signed)
Patient ID: Kara Lee, female   DOB: [birth year], 68 y.o.   MRN: [id number]     Subjective:  Patient reports pain as mild to moderate.  Patient resting in bed with no acute distress.  Denies any CP or SOB  Objective:   VITALS:   Filed Vitals:   07/31/13 2124 07/31/13 2335 08/01/13 0000 08/01/13 0627  BP: 113/53   115/56  Pulse: 84 82  80  Temp: 98.1 F (36.7 C)   97.8 F (36.6 C)  TempSrc:      Resp: 20 20 20 18   Height:      Weight:      SpO2: 97% 92% 90% 99%    ABD soft Sensation intact distally Dorsiflexion/Plantar flexion intact Incision: dressing C/D/I and no drainage Good ankle motion EHL FHL firing  Lab Results  Component Value Date   WBC 11.6* 08/01/2013   HGB 11.3* 08/01/2013   HCT 35.3* 08/01/2013   MCV 92.9 08/01/2013   PLT 190 08/01/2013     Assessment/Plan: 1 Day Post-Op   Principal Problem:   Osteoarthritis of right knee Active Problems:   Knee osteoarthritis   Advance diet Up with therapy Plan for discharge tomorrow  Vs. Later today, depending on how she does with PT and if pain is adequately controlled. WBAT Plan Dressing change tomorrow Okay to DC knee immobilizer   Joya Gaskins 08/01/2013, 7:47 AM  Seen and agree with above.  Marchia Bond, MD Cell [phone]

## 2013-08-01 NOTE — Progress Notes (Signed)
Physical Therapy Treatment Patient Details Name: Kara Lee MRN: [id number] DOB: 1946-01-21 Today's Date: 08/01/2013    History of Present Illness 68 y/o female s/p  RIGHT UNICOMPARTMENTAL KNEE    PT Comments    Pt did much better this afternoon. She has almost met her goals and given that her husband will be with her 24/7, has adequate function for home with him later today if that continues to ber her desire and MD approves. She practiced steps with min-guard A as well as ambulating 125' with RW and supervision. Recommend HHPT and RW for d/c.   Follow Up Recommendations  Home health PT;Supervision/Assistance - 24 hour     Equipment Recommendations  Rolling walker with 5" wheels;3in1 (PT)    Recommendations for Other Services       Precautions / Restrictions Precautions Precautions: Fall Required Braces or Orthoses: Knee Immobilizer - Right Restrictions Weight Bearing Restrictions: Yes RLE Weight Bearing: Weight bearing as tolerated Other Position/Activity Restrictions: able to ambulate without KI and no knee buckling    Mobility  Bed Mobility Overal bed mobility: Modified Independent Bed Mobility: Supine to Sit;Sit to Supine     Supine to sit: Modified independent (Device/Increase time) Sit to supine: Modified independent (Device/Increase time)   General bed mobility comments: able to get in and out of bed without physical assist, with use of UE's to assist  Transfers Overall transfer level: Modified independent Equipment used: Rolling walker (2 wheeled) Transfers: Sit to/from Stand Sit to Stand: Modified independent (Device/Increase time)         General transfer comment: safe placement of hands and steady  Ambulation/Gait Ambulation/Gait assistance: Supervision Ambulation Distance (Feet): 125 Feet Assistive device: Rolling walker (2 wheeled) Gait Pattern/deviations: Step-through pattern;Decreased stance time - right Gait velocity: decreased    General Gait Details: gait much improved, still guarded but working on step through pattern and getting right heel fully to floor   Stairs Stairs: Yes Stairs assistance: Min guard Stair Management: Two rails;Step to pattern;Forwards Number of Stairs: 5 General stair comments: vc's for sequencing and min-guard for safety. Husband present and educated on how to help pt  Wheelchair Mobility    Modified Rankin (Stroke Patients Only)       Balance Overall balance assessment: Needs assistance Sitting-balance support: No upper extremity supported Sitting balance-Leahy Scale: Good     Standing balance support: No upper extremity supported;During functional activity Standing balance-Leahy Scale: Fair Standing balance comment: able to stand without UE support (static)                    Cognition Arousal/Alertness: Awake/alert Behavior During Therapy: WFL for tasks assessed/performed Overall Cognitive Status: Within Functional Limits for tasks assessed Area of Impairment: Problem solving             Problem Solving: Slow processing;Requires verbal cues (due to meds) General Comments: normal mentation this afternoon    Exercises Total Joint Exercises Ankle Circles/Pumps: AROM;Both;10 reps;Supine Quad Sets: Strengthening;10 reps;Right;Supine Heel Slides: AROM;Right;10 reps;Seated Straight Leg Raises: AAROM;Right;10 reps;Supine Long Arc Quad: AROM;Right;10 reps;Seated    General Comments        Pertinent Vitals/Pain 7/10 right knee, premedicated    Home Living                      Prior Function            PT Goals (current goals can now be found in the care plan section) Acute Rehab PT Goals  Patient Stated Goal: return home PT Goal Formulation: With patient/family Time For Goal Achievement: 08/07/13 Potential to Achieve Goals: Good Progress towards PT goals: Progressing toward goals    Frequency  7X/week    PT Plan Current plan remains  appropriate    Co-evaluation             End of Session Equipment Utilized During Treatment: Gait belt Activity Tolerance: Patient tolerated treatment well Patient left: in chair;with call bell/phone within reach;with family/visitor present     Time: 8588-5027 PT Time Calculation (min): 33 min  Charges:  $Gait Training: 23-37 mins $Therapeutic Activity: 8-22 mins                    G Codes:     Leighton Roach, PT  Acute Rehab Services  [place] 08/01/2013, 4:08 PM

## 2013-08-01 NOTE — Evaluation (Signed)
Occupational Therapy Evaluation Patient Details Name: Kara Lee MRN: [id number] DOB: 08-21-1945 Today's Date: 08/01/2013    History of Present Illness 68 y/o female s/p  RIGHT UNICOMPARTMENTAL KNEE   Clinical Impression   Patient heavily medicated during OT eval, husband present.  Patient with consistent difficulty in descent from stand to sit, and on 2 occasions had controlled falls to sitting surface.  Patient unable to clear fabricated lip of walk in shower on 2 attempts.  Patient would benefit from additional practice with adaptive equipment for dressing / bathing.      Follow Up Recommendations  Home health OT    Equipment Recommendations  Tub/shower seat    Recommendations for Other Services       Precautions / Restrictions Precautions Precautions: Fall Required Braces or Orthoses: Knee Immobilizer - Right ("OK to D/C KI") Restrictions Weight Bearing Restrictions: Yes RLE Weight Bearing: Weight bearing as tolerated      Mobility Bed Mobility Overal bed mobility: Modified Independent Bed Mobility: Supine to Sit     Supine to sit: Modified independent (Device/Increase time);HOB elevated        Transfers Overall transfer level: Needs assistance Equipment used: Rolling walker (2 wheeled) Transfers: Sit to/from [facility] Sit to Stand: Min guard Stand pivot transfers: Min assist            Balance Overall balance assessment: Needs assistance Sitting-balance support: No upper extremity supported;Feet supported Sitting balance-Leahy Scale: Good     Standing balance support: Bilateral upper extremity supported;During functional activity Standing balance-Leahy Scale: Poor                              ADL Overall ADL's : Needs assistance/impaired Eating/Feeding: Independent   Grooming: Wash/dry hands;Wash/dry face;Oral care;Applying deodorant;Brushing hair;Sitting;Set up   Upper Body Bathing: Set up   Lower Body  Bathing: Sit to/from stand;Maximal assistance Lower Body Bathing Details (indicate cue type and reason): Cannot reach feet Upper Body Dressing : Minimal assistance;Sitting   Lower Body Dressing: Maximal assistance;Sit to/from stand   Toilet Transfer: Moderate assistance;Stand-pivot;RW;Grab bars   Toileting- Clothing Manipulation and Hygiene: Moderate assistance;Sit to/from stand   Tub/ Shower Transfer: Walk-in Academic librarian Details (indicate cue type and reason): Unable to clear 3 inch lip Functional mobility during ADLs: Moderate assistance General ADL Comments: Patient with limited safe mobility, unable to safely transition from stand to sit.  'Falls onto next surface without controlled descent."  Limited ability to clear feet over shower lip     Vision                     Perception Perception Perception Tested?: No   Praxis Praxis Praxis tested?: Not tested    Pertinent Vitals/Pain O2 dropping with position change (85%), although recovering within seconds (92%), Pain 4/10 right knee     Hand Dominance Right   Extremity/Trunk Assessment Upper Extremity Assessment Upper Extremity Assessment: Overall WFL for tasks assessed   Lower Extremity Assessment Lower Extremity Assessment: Defer to PT evaluation       Communication Communication Communication: No difficulties   Cognition Arousal/Alertness: Lethargic;Suspect due to medications Behavior During Therapy: Flat affect         Memory: Decreased recall of precautions             General Comments       Exercises       Shoulder Instructions  Home Living Family/patient expects to be discharged to:: Private residence Living Arrangements: Spouse/significant other Available Help at Discharge: Family Type of Home: House Home Access: Stairs to enter Technical brewer of Steps: 3 Entrance Stairs-Rails: Can reach both Home Layout: Two level Alternate Level  Stairs-Number of Steps: 14   Bathroom Shower/Tub: Tub/shower unit Shower/tub characteristics: Door       Home Equipment: Toilet riser   Additional Comments: bedroom and bathroom is located upstairs. If pt cannot negotiate stairs a BSC will be needed for downstair toileting.       Prior Functioning/Environment Level of Independence: Independent with assistive device(s)        Comments: ADLs limited by knee pain. pt reports using cane    OT Diagnosis: Generalized weakness;Acute pain   OT Problem List: Decreased strength;Impaired balance (sitting and/or standing);Pain;Decreased range of motion;Decreased safety awareness;Cardiopulmonary status limiting activity;Obesity;Increased edema;Decreased activity tolerance;Decreased knowledge of use of DME or AE   OT Treatment/Interventions: Self-care/ADL training;DME and/or AE instruction;Therapeutic activities;Therapeutic exercise;Balance training;Energy conservation;Patient/family education    OT Goals(Current goals can be found in the care plan section) Acute Rehab OT Goals Patient Stated Goal: return home OT Goal Formulation: With patient Time For Goal Achievement: 08/08/13 Potential to Achieve Goals: Good  OT Frequency: Min 3X/week   Barriers to D/C:            Co-evaluation              End of Session Equipment Utilized During Treatment: Rolling walker  Activity Tolerance: Patient limited by lethargy Patient left: in chair;with call bell/phone within reach;with family/visitor present   Time: 8546-2703 OT Time Calculation (min): 42 min Charges:  OT General Charges $OT Visit: 1 Procedure OT Evaluation $Initial OT Evaluation Tier I: 1 Procedure OT Treatments $Self Care/Home Management : 23-37 mins G-Codes:    Mariah Milling 08/08/13, 10:33 AM

## 2013-08-01 NOTE — Discharge Summary (Addendum)
Physician Discharge Summary  Patient ID: Kara Lee MRN: [id number] DOB/AGE: 1945/05/16 68 y.o.  Admit date: 07/31/2013 Discharge date: potential 08/01/2013  Admission Diagnoses:  Osteoarthritis of right knee  Discharge Diagnoses:  Principal Problem:   Osteoarthritis of right knee Active Problems:   Knee osteoarthritis   Past Medical History  Diagnosis Date  . Bicuspid aortic valve   . Tachycardia   . Complication of anesthesia     Difficult time waking up; prefers spinal  . PONV (postoperative nausea and vomiting)   . Family history of anesthesia complication     Makes son extremely hyper  . Dysrhythmia     Tachycardia  . Heart murmur   . Concussion November 09, 2012    From a fall at home on deck  . GERD (gastroesophageal reflux disease)   . Arthritis   . Diabetes mellitus without complication   . Dry skin   . Sleep apnea     CPAP wears nightly  . Shortness of breath     with exertion  . Pneumonia 2015  . Depression     postpartum depression after 2nd child  . Headache(784.0)     since having a concussion  . Fatty liver   . Rosacea   . Sensitive skin   . Osteoarthritis of right knee 07/31/2013    Surgeries: Procedure(s): RIGHT UNICOMPARTMENTAL KNEE on 07/31/2013   Consultants (if any):    Discharged Condition: Improved  Hospital Course: AMRITHA Lee is an 68 y.o. female who was admitted 07/31/2013 with a diagnosis of Osteoarthritis of right knee and went to the operating room on 07/31/2013 and underwent the above named procedures.    She was given perioperative antibiotics:  Anti-infectives   Start     Dose/Rate Route Frequency Ordered Stop   07/31/13 1600  ceFAZolin (ANCEF) IVPB 2 g/50 mL premix     2 g 100 mL/hr over 30 Minutes Intravenous Every 6 hours 07/31/13 1236 07/31/13 2316   07/31/13 0600  ceFAZolin (ANCEF) IVPB 2 g/50 mL premix  Status:  Discontinued     2 g 100 mL/hr over 30 Minutes Intravenous On call to O.R. 07/30/13 1354 07/31/13 1234    07/31/13 0540  ceFAZolin (ANCEF) 2-3 GM-% IVPB SOLR    Comments:  Scronce, Trina   : cabinet override      07/31/13 0540 07/31/13 0827    .  She was given sequential compression devices, early ambulation, and xarelto for DVT prophylaxis.  She benefited maximally from the hospital stay and there were no complications.    Recent vital signs:  Filed Vitals:   08/01/13 0627  BP: 115/56  Pulse: 80  Temp: 97.8 F (36.6 C)  Resp: 18    Recent laboratory studies:  Lab Results  Component Value Date   HGB 11.3* 08/01/2013   HGB 13.6 07/20/2013   HGB 13.3 05/22/2013   Lab Results  Component Value Date   WBC 11.6* 08/01/2013   PLT 190 08/01/2013   Lab Results  Component Value Date   INR 0.98 07/20/2013   Lab Results  Component Value Date   NA 137 08/01/2013   K 4.0 08/01/2013   CL 101 08/01/2013   CO2 23 08/01/2013   BUN 10 08/01/2013   CREATININE 0.74 08/01/2013   GLUCOSE 112* 08/01/2013    Discharge Medications:     Medication List    STOP taking these medications       acetaminophen 500 MG tablet  Commonly known as:  TYLENOL     ibuprofen 200 MG tablet  Commonly known as:  ADVIL,MOTRIN      TAKE these medications       atenolol 25 MG tablet  Commonly known as:  TENORMIN  Take 25-50 mg by mouth 2 (two) times daily. 50mg  in the morning and 25mg  in the evening     clonazePAM 0.5 MG tablet  Commonly known as:  KLONOPIN  Take 0.5 mg by mouth 2 (two) times daily.     colesevelam 625 MG tablet  Commonly known as:  WELCHOL  Take 1,875 mg by mouth 2 (two) times daily with a meal.     estradiol 0.1 MG/GM vaginal cream  Commonly known as:  ESTRACE  Place 1 Applicatorful vaginally once a week.     FLUoxetine 40 MG capsule  Commonly known as:  PROZAC  Take 40 mg by mouth daily.     HYDROmorphone 2 MG tablet  Commonly known as:  DILAUDID  Take 1 tablet (2 mg total) by mouth every 4 (four) hours as needed for severe pain.     metFORMIN 500 MG (MOD) 24 hr tablet   Commonly known as:  GLUMETZA  Take 500 mg by mouth daily with breakfast.     methocarbamol 500 MG tablet  Commonly known as:  ROBAXIN  Take 1 tablet (500 mg total) by mouth 4 (four) times daily.     multivitamin capsule  Take 1 capsule by mouth daily.     oxyCODONE-acetaminophen 10-325 MG per tablet  Commonly known as:  PERCOCET  Take 1-2 tablets by mouth every 6 (six) hours as needed for pain. MAXIMUM TOTAL ACETAMINOPHEN DOSE IS 4000 MG PER DAY     pantoprazole 40 MG tablet  Commonly known as:  PROTONIX  Take 40 mg by mouth daily.     promethazine 25 MG tablet  Commonly known as:  PHENERGAN  Take 1 tablet (25 mg total) by mouth every 6 (six) hours as needed for nausea or vomiting.     rivaroxaban 10 MG Tabs tablet  Commonly known as:  XARELTO  Take 1 tablet (10 mg total) by mouth daily.     SYSTANE OP  Place 1-2 drops into both eyes daily as needed (dry eyes).     vitamin C 500 MG tablet  Commonly known as:  ASCORBIC ACID  Take 1,000 mg by mouth daily as needed (around sick people).        Diagnostic Studies: Dg Knee Right Port  07/31/2013   CLINICAL DATA:  Postop right knee  EXAM: PORTABLE RIGHT KNEE - 1-2 VIEW  COMPARISON:  None.  FINDINGS: Hemiarthroplasty of the right knee has been performed. The prosthetic components are well-seated and aligned. There is no acute fracture or evidence of an operative complication.  IMPRESSION: Well aligned right knee hemiarthroplasty.   Electronically Signed   By: Lajean Manes M.D.   On: 07/31/2013 11:41    Disposition: Final discharge disposition not confirmed      Discharge Orders   Future Orders Complete By Expires   Weight bearing as tolerated  As directed    Questions:     Laterality:     Extremity:        Follow-up Information   Follow up with Johnny Bridge, MD. Schedule an appointment as soon as possible for a visit in 2 weeks.   Specialty:  Orthopedic Surgery   Contact information:   [address]  [place] [address] [phone]  Signed: Johnny Bridge 08/01/2013, 8:22 AM

## 2013-08-03 NOTE — Anesthesia Postprocedure Evaluation (Signed)
  Anesthesia Post-op Note  Patient: Kara Lee  Procedure(s) Performed: Procedure(s): RIGHT UNICOMPARTMENTAL KNEE (Right)  Patient Location: PACU  Anesthesia Type:General  Level of Consciousness: awake  Airway and Oxygen Therapy: Patient Spontanous Breathing  Post-op Pain: mild  Post-op Assessment: Post-op Vital signs reviewed  Post-op Vital Signs: Reviewed  Last Vitals:  Filed Vitals:   08/01/13 1600  BP:   Pulse:   Temp:   Resp: 16    Complications: No apparent anesthesia complications

## 2014-06-27 ENCOUNTER — Other Ambulatory Visit: Payer: Self-pay

## 2014-06-27 ENCOUNTER — Other Ambulatory Visit: Payer: Self-pay | Admitting: Physical Medicine and Rehabilitation

## 2014-06-27 DIAGNOSIS — M549 Dorsalgia, unspecified: Secondary | ICD-10-CM

## 2014-07-09 ENCOUNTER — Emergency Department (HOSPITAL_BASED_OUTPATIENT_CLINIC_OR_DEPARTMENT_OTHER): Payer: Medicare Other

## 2014-07-09 ENCOUNTER — Encounter (HOSPITAL_BASED_OUTPATIENT_CLINIC_OR_DEPARTMENT_OTHER): Payer: Self-pay | Admitting: *Deleted

## 2014-07-09 ENCOUNTER — Emergency Department (HOSPITAL_BASED_OUTPATIENT_CLINIC_OR_DEPARTMENT_OTHER)
Admission: EM | Admit: 2014-07-09 | Discharge: 2014-07-09 | Disposition: A | Discharge status: Home / Self Care | Payer: Medicare Other | Attending: Emergency Medicine | Admitting: Emergency Medicine

## 2014-07-09 DIAGNOSIS — Z872 Personal history of diseases of the skin and subcutaneous tissue: Secondary | ICD-10-CM | POA: Insufficient documentation

## 2014-07-09 DIAGNOSIS — M1990 Unspecified osteoarthritis, unspecified site: Secondary | ICD-10-CM | POA: Insufficient documentation

## 2014-07-09 DIAGNOSIS — R0600 Dyspnea, unspecified: Secondary | ICD-10-CM | POA: Diagnosis not present

## 2014-07-09 DIAGNOSIS — Z9981 Dependence on supplemental oxygen: Secondary | ICD-10-CM | POA: Diagnosis not present

## 2014-07-09 DIAGNOSIS — E119 Type 2 diabetes mellitus without complications: Secondary | ICD-10-CM | POA: Diagnosis not present

## 2014-07-09 DIAGNOSIS — Z7901 Long term (current) use of anticoagulants: Secondary | ICD-10-CM | POA: Diagnosis not present

## 2014-07-09 DIAGNOSIS — Z87820 Personal history of traumatic brain injury: Secondary | ICD-10-CM | POA: Insufficient documentation

## 2014-07-09 DIAGNOSIS — Z79899 Other long term (current) drug therapy: Secondary | ICD-10-CM | POA: Insufficient documentation

## 2014-07-09 DIAGNOSIS — R079 Chest pain, unspecified: Secondary | ICD-10-CM | POA: Diagnosis present

## 2014-07-09 DIAGNOSIS — Z91040 Latex allergy status: Secondary | ICD-10-CM | POA: Insufficient documentation

## 2014-07-09 DIAGNOSIS — F329 Major depressive disorder, single episode, unspecified: Secondary | ICD-10-CM | POA: Insufficient documentation

## 2014-07-09 DIAGNOSIS — R011 Cardiac murmur, unspecified: Secondary | ICD-10-CM | POA: Diagnosis not present

## 2014-07-09 DIAGNOSIS — K219 Gastro-esophageal reflux disease without esophagitis: Secondary | ICD-10-CM | POA: Insufficient documentation

## 2014-07-09 DIAGNOSIS — G4730 Sleep apnea, unspecified: Secondary | ICD-10-CM | POA: Insufficient documentation

## 2014-07-09 DIAGNOSIS — R531 Weakness: Secondary | ICD-10-CM | POA: Diagnosis not present

## 2014-07-09 LAB — CBC
HCT: 43.7 % (ref 36.0–46.0)
Hemoglobin: 14.1 g/dL (ref 12.0–15.0)
MCH: 29.2 pg (ref 26.0–34.0)
MCHC: 32.3 g/dL (ref 30.0–36.0)
MCV: 90.5 fL (ref 78.0–100.0)
Platelets: 243 10*3/uL (ref 150–400)
RBC: 4.83 MIL/uL (ref 3.87–5.11)
RDW: 13.3 % (ref 11.5–15.5)
WBC: 9.1 10*3/uL (ref 4.0–10.5)

## 2014-07-09 LAB — COMPREHENSIVE METABOLIC PANEL
ALT: 36 U/L — ABNORMAL HIGH (ref 0–35)
AST: 34 U/L (ref 0–37)
Albumin: 4.3 g/dL (ref 3.5–5.2)
Alkaline Phosphatase: 72 U/L (ref 39–117)
Anion gap: 10 (ref 5–15)
BUN: 11 mg/dL (ref 6–23)
CO2: 24 mmol/L (ref 19–32)
Calcium: 9.6 mg/dL (ref 8.4–10.5)
Chloride: 104 mmol/L (ref 96–112)
Creatinine, Ser: 0.84 mg/dL (ref 0.50–1.10)
GFR calc Af Amer: 82 mL/min — ABNORMAL LOW (ref >90)
GFR calc non Af Amer: 71 mL/min — ABNORMAL LOW (ref >90)
Glucose, Bld: 143 mg/dL — ABNORMAL HIGH (ref 70–99)
Potassium: 4 mmol/L (ref 3.5–5.1)
Sodium: 138 mmol/L (ref 135–145)
Total Bilirubin: 0.4 mg/dL (ref 0.3–1.2)
Total Protein: 8 g/dL (ref 6.0–8.3)

## 2014-07-09 LAB — URINALYSIS, ROUTINE W REFLEX MICROSCOPIC
Bilirubin Urine: NEGATIVE
Glucose, UA: NEGATIVE mg/dL
Hgb urine dipstick: NEGATIVE
Ketones, ur: NEGATIVE mg/dL
Leukocytes, UA: NEGATIVE
Nitrite: NEGATIVE
Protein, ur: NEGATIVE mg/dL
Specific Gravity, Urine: 1.018 (ref 1.005–1.030)
Urobilinogen, UA: 0.2 mg/dL (ref 0.0–1.0)
pH: 6 (ref 5.0–8.0)

## 2014-07-09 LAB — TROPONIN I
Troponin I: <0.03 ng/mL (ref <0.031)
Troponin I: <0.03 ng/mL (ref <0.031)

## 2014-07-09 LAB — BRAIN NATRIURETIC PEPTIDE: B Natriuretic Peptide: 31.5 pg/mL (ref 0.0–100.0)

## 2014-07-09 NOTE — ED Provider Notes (Signed)
CSN: [id number]     Arrival date & time 07/09/14  1821 History  This chart was scribed for Debby Freiberg, MD by Starleen Arms, ED Scribe. This patient was seen in room MH10/MH10 and the patient's care was started at 6:53 PM.   Chief Complaint  Patient presents with  . Chest Pain   The history is provided by the patient. No language interpreter was used.   HPI Comments: Kara Lee is a 69 y.o. female with a history of tachycardia and a bicuspid aortic valve who presents to the Emergency Department complaining of resolved CP with radiation to her back and left arm with associated chills onset 3 days ago.  The patient reports she was moving multiple loads of furniture up and down her steps and the pain onset shortly afterward.  No known exacerbating factors at time of pain with exception of movement, symptoms were constant. The patient reports current, mild SOB normal to baseline.  She reports she came to the ED today because she was chastised by a friend to be "checked out".    Patient denies history of MI  Past Medical History  Diagnosis Date  . Bicuspid aortic valve   . Tachycardia   . Complication of anesthesia     Difficult time waking up; prefers spinal  . PONV (postoperative nausea and vomiting)   . Family history of anesthesia complication     Makes son extremely hyper  . Dysrhythmia     Tachycardia  . Heart murmur   . Concussion November 09, 2012    From a fall at home on deck  . GERD (gastroesophageal reflux disease)   . Arthritis   . Diabetes mellitus without complication   . Dry skin   . Sleep apnea     CPAP wears nightly  . Shortness of breath     with exertion  . Pneumonia 2015  . Depression     postpartum depression after 2nd child  . Headache(784.0)     since having a concussion  . Fatty liver   . Rosacea   . Sensitive skin   . Osteoarthritis of right knee 07/31/2013   Past Surgical History  Procedure Laterality Date  . Biopsy breast Bilateral   . Eye  surgery      lasik; X3 for crossed eyes; 1 eyelid surgery  . Ankle reconstruction Left   . Hernia repair    . Cholecystectomy    . Abdominal hysterectomy      still has uterus  . Colonoscopy    . Tubal ligation    . Cesarean section      x 2  . Partial knee arthroplasty Right 07/31/2013    Procedure: RIGHT UNICOMPARTMENTAL KNEE;  Surgeon: Johnny Bridge, MD;  Location: [facility];  Service: Orthopedics;  Laterality: Right;   Family History  Problem Relation Age of Onset  . Pancreatitis Mother   . COPD Mother   . Cancer - Lung Mother   . Diabetes type II Mother    History  Substance Use Topics  . Smoking status: Never Smoker   . Smokeless tobacco: Never Used  . Alcohol Use: Yes     Comment: rare   OB History    No data available     Review of Systems  All other systems reviewed and are negative.     Allergies  Latex; Lovenox; Minocycline; Morphine and related; Nickel; Shellfish allergy; Statins; Sulfa antibiotics; and Tape  Home Medications   Prior to  Admission medications   Medication Sig Start Date End Date Taking? Authorizing Provider  atenolol (TENORMIN) 25 MG tablet Take 25-50 mg by mouth 2 (two) times daily. 50mg  in the morning and 25mg  in the evening   Yes Historical Provider, MD  clonazePAM (KLONOPIN) 0.5 MG tablet Take 0.5 mg by mouth 2 (two) times daily.   Yes Historical Provider, MD  estradiol (ESTRACE) 0.1 MG/GM vaginal cream Place 1 Applicatorful vaginally once a week.    Yes Historical Provider, MD  FLUoxetine (PROZAC) 40 MG capsule Take 40 mg by mouth daily.   Yes Historical Provider, MD  metFORMIN (GLUMETZA) 500 MG (MOD) 24 hr tablet Take 500 mg by mouth daily with breakfast.   Yes Historical Provider, MD  Multiple Vitamin (MULTIVITAMIN) capsule Take 1 capsule by mouth daily.   Yes Historical Provider, MD  pantoprazole (PROTONIX) 40 MG tablet Take 40 mg by mouth daily.   Yes Historical Provider, MD  Polyethyl Glycol-Propyl Glycol (SYSTANE OP) Place 1-2  drops into both eyes daily as needed (dry eyes).   Yes Historical Provider, MD  vitamin C (ASCORBIC ACID) 500 MG tablet Take 1,000 mg by mouth daily as needed (around sick people).   Yes Historical Provider, MD  colesevelam (WELCHOL) 625 MG tablet Take 1,875 mg by mouth 2 (two) times daily with a meal.    Historical Provider, MD  HYDROmorphone (DILAUDID) 2 MG tablet Take 1 tablet (2 mg total) by mouth every 4 (four) hours as needed for severe pain. 08/01/13   Marchia Bond, MD  methocarbamol (ROBAXIN) 500 MG tablet Take 1 tablet (500 mg total) by mouth 4 (four) times daily. 07/31/13   Marchia Bond, MD  oxyCODONE-acetaminophen (PERCOCET) 10-325 MG per tablet Take 1-2 tablets by mouth every 6 (six) hours as needed for pain. MAXIMUM TOTAL ACETAMINOPHEN DOSE IS 4000 MG PER DAY 07/31/13   Marchia Bond, MD  promethazine (PHENERGAN) 25 MG tablet Take 1 tablet (25 mg total) by mouth every 6 (six) hours as needed for nausea or vomiting. 07/31/13   Marchia Bond, MD  rivaroxaban (XARELTO) 10 MG TABS tablet Take 1 tablet (10 mg total) by mouth daily. 07/31/13   Marchia Bond, MD   BP 123/69 mmHg  Pulse 65  Temp(Src) 98 F (36.7 C) (Oral)  Resp 14  Ht 5\' 2"  (1.575 m)  Wt 243 lb (110.224 kg)  BMI 44.43 kg/m2  SpO2 96% Physical Exam  Constitutional: She is oriented to person, place, and time. She appears well-developed and well-nourished.  HENT:  Head: Normocephalic and atraumatic.  Right Ear: External ear normal.  Left Ear: External ear normal.  Eyes: Conjunctivae and EOM are normal. Pupils are equal, round, and reactive to light.  Neck: Normal range of motion. Neck supple.  Cardiovascular: Normal rate, regular rhythm, normal heart sounds and intact distal pulses.   Pulmonary/Chest: Effort normal and breath sounds normal.  Abdominal: Soft. Bowel sounds are normal. There is no tenderness.  Musculoskeletal: Normal range of motion.  Neurological: She is alert and oriented to person, place, and time.   Skin: Skin is warm and dry.  Vitals reviewed.   ED Course  Procedures (including critical care time)  DIAGNOSTIC STUDIES: Oxygen Saturation is 96% on RA, adequate by my interpretation.    COORDINATION OF CARE:    Labs Review Labs Reviewed  COMPREHENSIVE METABOLIC PANEL - Abnormal; Notable for the following:    Glucose, Bld 143 (*)    ALT 36 (*)    GFR calc non Af Amer 71 (*)  GFR calc Af Amer 82 (*)    All other components within normal limits  CBC  TROPONIN I  BRAIN NATRIURETIC PEPTIDE  URINALYSIS, ROUTINE W REFLEX MICROSCOPIC  TROPONIN I    Imaging Review Dg Chest 2 View  07/09/2014   CLINICAL DATA:  Shortness of breath and central chest pain.  EXAM: CHEST  2 VIEW  COMPARISON:  06/07/2013  FINDINGS: The heart size and mediastinal contours are within normal limits. Both lungs are clear. Spondylosis noted within the thoracic spine.  IMPRESSION: 1. No acute cardiopulmonary abnormalities.   Electronically Signed   By: Kerby Moors M.D.   On: 07/09/2014 19:34     EKG Interpretation   Date/Time:  Tuesday July 09 2014 18:42:03 EDT Ventricular Rate:  68 PR Interval:  178 QRS Duration: 88 QT Interval:  430 QTC Calculation: 457 R Axis:   -39 Text Interpretation:  Normal sinus rhythm Left axis deviation Moderate  voltage criteria for LVH, may be normal variant Abnormal ECG No  significant change since last tracing Confirmed by Debby Freiberg [phone])  on 07/09/2014 6:50:17 PM      MDM   Final diagnoses:  Chest pain, unspecified chest pain type    69 y.o. female with pertinent PMH of bicuspid aortic valve, tachycardia presents with chest pain and dyspnea with weakness as above. Patient asymptomatic at time of exam. Workup as above obtained, unremarkable for acute pathology.  Discussed that likely etiology of patient's symptoms was musculoskeletal tenderness secondary to lifting weights. Patient discharged home to follow-up with cardiology. Counseled her to avoid  strenuous activity until cleared by cardiologist. She voiced understanding, agreed to follow-up..    I have reviewed all laboratory and imaging studies if ordered as above  1. Chest pain, unspecified chest pain type           Debby Freiberg, MD 07/09/14 2320

## 2014-07-09 NOTE — Discharge Instructions (Signed)

## 2014-07-09 NOTE — ED Notes (Signed)
MD at bedside. 

## 2014-07-09 NOTE — ED Notes (Signed)
Pt sts she began having left, lower CP on Saturday night. She sts the pain in her chest has resolved but she has been SOB and has pain in her back and left arm since then.

## 2014-07-16 ENCOUNTER — Ambulatory Visit
Admission: RE | Admit: 2014-07-16 | Discharge: 2014-07-16 | Disposition: A | Discharge status: Home / Self Care | Payer: Medicare Other | Source: Ambulatory Visit | Attending: Physical Medicine and Rehabilitation | Admitting: Physical Medicine and Rehabilitation

## 2014-07-16 ENCOUNTER — Other Ambulatory Visit: Payer: Self-pay

## 2014-07-16 DIAGNOSIS — M549 Dorsalgia, unspecified: Secondary | ICD-10-CM

## 2016-02-02 ENCOUNTER — Institutional Professional Consult (permissible substitution): Payer: Medicare Other | Admitting: Pulmonary Disease

## 2016-03-02 ENCOUNTER — Encounter: Payer: Self-pay | Admitting: Cardiology

## 2016-04-21 HISTORY — PX: COLONOSCOPY: SHX174

## 2017-08-12 ENCOUNTER — Other Ambulatory Visit: Payer: Self-pay | Admitting: Physical Medicine and Rehabilitation

## 2017-08-12 DIAGNOSIS — M5124 Other intervertebral disc displacement, thoracic region: Secondary | ICD-10-CM

## 2017-08-25 ENCOUNTER — Other Ambulatory Visit: Payer: Medicare Other

## 2017-08-31 ENCOUNTER — Ambulatory Visit (HOSPITAL_COMMUNITY): Payer: Medicare Other

## 2017-08-31 ENCOUNTER — Encounter (HOSPITAL_COMMUNITY): Payer: Self-pay | Admitting: *Deleted

## 2017-08-31 ENCOUNTER — Encounter (HOSPITAL_COMMUNITY)
Admission: RE | Disposition: A | Discharge status: Home / Self Care | Payer: Self-pay | Source: Ambulatory Visit | Attending: Cardiology

## 2017-08-31 ENCOUNTER — Ambulatory Visit (HOSPITAL_COMMUNITY)
Admission: RE | Admit: 2017-08-31 | Discharge: 2017-08-31 | Disposition: A | Discharge status: Home / Self Care | Payer: Medicare Other | Source: Ambulatory Visit | Attending: Cardiology | Admitting: Cardiology

## 2017-08-31 ENCOUNTER — Other Ambulatory Visit: Payer: Self-pay

## 2017-08-31 DIAGNOSIS — Z7982 Long term (current) use of aspirin: Secondary | ICD-10-CM | POA: Diagnosis not present

## 2017-08-31 DIAGNOSIS — Z7984 Long term (current) use of oral hypoglycemic drugs: Secondary | ICD-10-CM | POA: Diagnosis not present

## 2017-08-31 DIAGNOSIS — E7800 Pure hypercholesterolemia, unspecified: Secondary | ICD-10-CM | POA: Insufficient documentation

## 2017-08-31 DIAGNOSIS — E785 Hyperlipidemia, unspecified: Secondary | ICD-10-CM | POA: Diagnosis not present

## 2017-08-31 DIAGNOSIS — G4733 Obstructive sleep apnea (adult) (pediatric): Secondary | ICD-10-CM | POA: Insufficient documentation

## 2017-08-31 DIAGNOSIS — I350 Nonrheumatic aortic (valve) stenosis: Secondary | ICD-10-CM | POA: Diagnosis present

## 2017-08-31 DIAGNOSIS — Z6841 Body Mass Index (BMI) 40.0 and over, adult: Secondary | ICD-10-CM | POA: Diagnosis not present

## 2017-08-31 DIAGNOSIS — E119 Type 2 diabetes mellitus without complications: Secondary | ICD-10-CM | POA: Diagnosis not present

## 2017-08-31 DIAGNOSIS — Z79899 Other long term (current) drug therapy: Secondary | ICD-10-CM | POA: Insufficient documentation

## 2017-08-31 DIAGNOSIS — K219 Gastro-esophageal reflux disease without esophagitis: Secondary | ICD-10-CM | POA: Diagnosis not present

## 2017-08-31 DIAGNOSIS — Z79890 Hormone replacement therapy (postmenopausal): Secondary | ICD-10-CM | POA: Insufficient documentation

## 2017-08-31 HISTORY — PX: TEE WITHOUT CARDIOVERSION: SHX5443

## 2017-08-31 LAB — GLUCOSE, CAPILLARY: Glucose-Capillary: 131 mg/dL — ABNORMAL HIGH (ref 65–99)

## 2017-08-31 SURGERY — ECHOCARDIOGRAM, TRANSESOPHAGEAL
Anesthesia: Moderate Sedation

## 2017-08-31 MED ORDER — MIDAZOLAM HCL 5 MG/ML IJ SOLN
INTRAMUSCULAR | Status: AC
  Filled 2017-08-31: qty 2

## 2017-08-31 MED ORDER — FENTANYL CITRATE (PF) 100 MCG/2ML IJ SOLN
INTRAMUSCULAR | Status: DC | PRN
  Administered 2017-08-31: 50 ug via INTRAVENOUS

## 2017-08-31 MED ORDER — BUTAMBEN-TETRACAINE-BENZOCAINE 2-2-14 % EX AERO
INHALATION_SPRAY | CUTANEOUS | Status: DC | PRN
  Administered 2017-08-31: 2 via TOPICAL

## 2017-08-31 MED ORDER — DIPHENHYDRAMINE HCL 50 MG/ML IJ SOLN
INTRAMUSCULAR | Status: AC
Start: 2017-08-31 — End: ?
  Filled 2017-08-31: qty 1

## 2017-08-31 MED ORDER — MIDAZOLAM HCL 10 MG/2ML IJ SOLN
INTRAMUSCULAR | Status: DC | PRN
  Administered 2017-08-31: 1 mg via INTRAVENOUS
  Administered 2017-08-31: 2 mg via INTRAVENOUS
  Administered 2017-08-31 (×2): 1 mg via INTRAVENOUS

## 2017-08-31 MED ORDER — FENTANYL CITRATE (PF) 100 MCG/2ML IJ SOLN
INTRAMUSCULAR | Status: AC
  Filled 2017-08-31: qty 2

## 2017-08-31 MED ORDER — SODIUM CHLORIDE 0.9 % IV SOLN
INTRAVENOUS | Status: DC
  Administered 2017-08-31: 10:00:00 via INTRAVENOUS

## 2017-08-31 NOTE — CV Procedure (Signed)
TEE: Under moderate sedation, TEE was performed without complications: LV: Normal. Normal EF. Moderate LVH RV: Normal LA: Normal. Left atrial appendage: Normal without thrombus. Normal function. Inter atrial septum is intact without defect. Double contrast study not done. RA: Normal  MV: Normal Trace MR. TV: Normal Trace TR AV: Calcified.  Moderate AS. Peak PG of 60-65 mm Hg and mean PG of 35 mm Hg. AVA 1.01 cm2. Trace AI PV: Normal. Trace PI.  Thoracic and ascending aorta: Mild theromatous changes noted in the arch of the aorta.  Conscious sedation protocol was followed, I personally administered conscious sedation and monitored the patient. Patient received 5 milligrams of Versed and 50 mcg Fentanyl . Patient tolerated the procedure well and there was no complication from conscious sedation. Time administered was 35 minutes.  Rec: Continue Medical therapy for now. Suspect dyspnea due to obesity and hypoventilation. May consider stress test to exclude CAD.

## 2017-08-31 NOTE — H&P (Signed)
OFFICE VISIT NOTES COPIED TO EPIC FOR DOCUMENTATION  . History of Present Illness Kara Lee; 08/31/2017 5:45 AM) Patient words: fu up for echo results, aortic Stenosis; last OV 07/28/17.  The patient is a 72 year old female who presents for a Follow-up for Aortic stenosis. Kara Lee is a 72 year old Caucasian female with morbid obesity, diabetes mellitus, hyperlipidemia, moderate aortic stenosis, history of SVT who is had no recent recurrence, presents here for follow-up of aortic stenosis.  Over the past few months she has noticed marked dyspnea on exertion which has gotten worse associated with lightheadedness. She also has to stop after climbing stairs midway due to dyspnea. No associated chest pain, palpitations.   Problem List/Past Medical Kara Lee; 09/03/2017 4:15 PM) GERD (gastroesophageal reflux disease) (K21.9)  Did not keep GI appointment 03/04/2016: Severe aortic stenosis (I35.0)  EKG 07/28/2017: Sinus rhythm 64 bpm. Leftward axis. Left ventricular hypertrophy. Poor R-wave progression. Echocardiogram 08/26/2017: Left ventricle cavity is normal in size. Mild concentric hypertrophy of the left ventricle. Normal global wall motion. Visual EF is 50-55%. Doppler evidence of grade I (impaired) diastolic dysfunction. Calculated EF 55%. Left atrial cavity is moderately dilated. Mild (Grade I) aortic regurgitation. Moderate aortic valve leaflet calcification. Severe aortic valve stenosis. Aortic valve mean gradient of 44 mmHg, Vmax of 4.1 m/s. Calculated aortic valve area by continuity equation is 0.9 cm. Compared to previous study in 01/2016, there is progression of aortic stenosis. No evidence of pulmonary hypertension. History of supraventricular tachycardia (Z86.79)  Morbid obesity with BMI of 45.0-49.9, adult (E66.01)  Hypercholesteremia (E78.00)  Controlled type 2 diabetes mellitus without complication, without long-term current use of insulin (E11.9)   Obstructive sleep apnea, adult (G47.33)  Bilateral carotid bruits (R09.89)  Carotid artery duplex 08/26/2017: Minimal stenosis in the bilateral internal carotid artery (1-15%). No significant plaque. Antegrade right vertebral artery flow. Antegrade left vertebral artery flow. F/U studies if clinically indicated. Dyspnea on exertion (R06.09)  Lexiscan myoview stress test 03/03/2015: 1. The resting electrocardiogram demonstrated normal sinus rhythm, incomplete RBBB and no resting arrhythmias. Stress EKG is non-diagnostic for ischemia as it a pharmacologic stress using Lexiscan. Stress symptoms included dyspnea. 2. Myocardial perfusion imaging is normal. Overall left ventricular systolic function was normal without regional wall motion abnormalities. The left ventricular ejection fraction was 67%. Arthritis (M19.90)  Labwork  Labs 12/19/2015: Total cholesterol 163, triglycerides 142, HDL 54, LDL 103. Non-HDL cholesterol 109. A1c 7.6%. 05/23/2015: Total cholesterol 168, triglycerides 156, HDL 57, LDL 80, LDL particle #[id number], hepatic function normal Labs 02/06/2015: Vitamin D 41.9, HbA1c 6.7%, total cholesterol 220, triglycerides 158, HDL 48, LDL 140, serum glucose 133, creatinine 0.62, potassium 4.5, CMP otherwise normal, CBC normal, TSH 1.680  Allergies Kara Lee; 2017-09-03 4:15 PM) Latex  Itching, Swelling. Lovenox *ANTICOAGULANTS*  Minocycline HCl *TETRACYCLINES*  Nausea, Vomiting. Morphine Derivatives  Vomiting. Sulfa Antibiotics  Itching, Vomiting. Tape 1"X5yd *MEDICAL DEVICES AND SUPPLIES*  Itching. Statins  myalgia  Family History Kara Lee; 2017-09-03 4:15 PM) Mother  Deceased. at age 59 from [place]; No heart attacks or strokes, DM but no other cardiovascular conditions Father  Deceased. No information known about Biological Father Sister 1  half-sister, younger  Social History Kara Lee; 09-03-2017 4:15 PM) Current tobacco use  Never smoker. Non Drinker/No  Alcohol Use  Marital status  Married. Living Situation  Lives with spouse. Number of Children  2.  Past Surgical History Kara Lee; 09/03/2017 4:15 PM) Eye Surgery [1956]: x2 Bilateral Breast Biopsy [1983]: Tubal Ligation [1988]: Cesarean  Delivery [1988]: 1977 Ankle Surgery [02/1993]: Lasix Surgery [1996]: Cholecystectomy [1998]: Inguinal Hernia Repair [2002]: Hysterectomy; Abdominal [2002]: Not due to Cancer; partial Rt Knee Arthroplasty [07/2013]: Cataract Extraction-Bilateral [11/2015]:  Medication History Kara Page, Lee; 08/31/2017 5:44 AM) Atenolol (25MG  Tablet, 1 (one) Tablet Tablet Oral twice daily, Taken starting 05/30/2017) Active: per doctors request. Livalo (2MG  Tablet, 1 (one) Tablet Tablet Oral daily, Taken starting 03/18/2017) Active. (pa APPROVED 03/07/2017-03/07/2018) Estrace (0.1MG /GM Cream, 1 Vaginal weekly) Active. Aspirin (81MG  Tablet, 1 Oral daily) Active. ClonazePAM (0.5MG  Tablet, 1 Oral every 12 hours) Active. FLUoxetine HCl (40MG  Capsule, 1 Oral daily) Active. MetFORMIN HCl (500MG  Tablet, 2 Oral daily) Active. Fluticasone Propionate (50MCG/ACT Suspension, 1 Spray in each nostrils Nasal daily as needed) Active. EpiPen 2-Pak (0.3MG /0.3ML Soln Auto-inj, Injection use as directed) Active. Esomeprazole Magnesium (40MG  Capsule DR, 1 Oral daily) Active. tiZANidine HCl (4MG  Tablet, 1 Oral as needed) Discontinued: Patient Choice. Medications Reconciled (verbally with pt/ no list present)  Diagnostic Studies History Kara Lee; 09/15/17 4:15 PM) Vascular Ultrasound  Exercise Treadmill Stress Test 08/19/2017: Indication: CP and SoB  The patient exercised on a Modified Bruce protocol for 4:59 min. Patient achieved 5.37 METS and reached HR 140 bpm, which is 94% of maximum age-predicted HR. Stress test terminated due to Fatigue.  Exercise capacity was below average for age. HR Response to Exercise: Appropriate. BP  Response to Exercise: Normal resting BP- appropriate response. Chest Pain: none. Arrhythmias: none. ST Changes: With peak exercise there was no ST-T changes of ischemia.  Overall Impression: Normal stress test. Continue primary/secondary prevention. Echocardiogram  Echocardiogram 08/26/2017: Left ventricle cavity is normal in size. Mild concentric hypertrophy of the left ventricle. Normal global wall motion. Visual EF is 50-55%. Doppler evidence of grade I (impaired) diastolic dysfunction. Calculated EF 55%. Left atrial cavity is moderately dilated. Mild (Grade I) aortic regurgitation. Moderate aortic valve leaflet calcification. Severe aortic valve stenosis. Aortic valve mean gradient of 44 mmHg, Vmax of 4.1 m/s. Calculated aortic valve area by continuity equation is 0.9 cm. Compared to previous study in 01/2016, there is progression of aortic stenosis. No evidence of pulmonary hypertension.    Review of Systems Kara Maine FNP-C; 09-15-17 4:35 PM) General Present- Fatigue. Not Present- Anorexia and Fever. Respiratory Present- Decreased Exercise Tolerance and Difficulty Breathing on Exertion (worsening). Not Present- Cough. Cardiovascular Present- Chest Pain (Back pain radiating to her chest) and Difficulty Breathing On Exertion. Not Present- Claudications, Edema, Orthopnea and Paroxysmal Nocturnal Dyspnea. Gastrointestinal Not Present- Change in Bowel Habits, Constipation and Nausea. Musculoskeletal Present- Back Pain (bulging disc). Neurological Present- Dizziness. Not Present- Focal Neurological Symptoms. Endocrine Not Present- Appetite Changes, Cold Intolerance and Heat Intolerance. Hematology Not Present- Anemia, Petechiae and Prolonged Bleeding. All other systems negative  Vitals Regan Lemming; 09-15-17 4:27 PM) 2017/09/15 4:18 PM Weight: 244.56 lb Height: 62in Body Surface Area: 2.08 m Body Mass Index: 44.73 kg/m  Pulse: 65 (Regular)  P.OX: 96% (Room  air) BP: 108/59 (Sitting, Left Arm, Standard)       Physical Exam Kara Lee; 08/31/2017 5:39 AM) General Mental Status-Alert. General Appearance-Cooperative, Appears stated age, Not in acute distress. Orientation-Oriented X3. Build & Nutrition-Moderately built and Morbidly obese.  Head and Neck Thyroid Gland Characteristics - no palpable nodules, no palpable enlargement.  Chest and Lung Exam Chest and lung exam reveals -quiet, even and easy respiratory effort with no use of accessory muscles and on auscultation, normal breath sounds, no adventitious sounds.  Cardiovascular Inspection Jugular vein - Right - No Distention.  Auscultation Heart Sounds - S1 WNL, S2 Diminished intensity and No gallop present. Murmurs & Other Heart Sounds: Murmur - Location - Aortic Area. Timing - Early systolic. Grade - III/VI. Character - Rumbling and Crescendo/Decrescendo. Radiation - Carotids.  Abdomen Palpation/Percussion Normal exam - Non Tender and No hepatosplenomegaly. Auscultation Normal exam - Bowel sounds normal.  Peripheral Vascular Lower Extremity Inspection - Left - Varicose veins, No Pigmentation. Right - Varicose veins, No Pigmentation. Palpation - Edema - Bilateral - No edema. Femoral pulse - Bilateral - Feeble(Pulsus difficult to feel due to patient's bodily habitus.). Popliteal pulse - Bilateral - Feeble(Pulsus difficult to feel due to patient's bodily habitus.). Dorsalis pedis pulse - Bilateral - Normal. Posterior tibial pulse - Bilateral - Absent. Carotid arteries - Bilateral-Soft Bruit. Abdomen-No prominent abdominal aortic pulsation, No epigastric bruit.  Neurologic Motor-Grossly intact without any focal deficits.  Musculoskeletal - Did not examine.   Results Kara Lee; 08/31/2017 5:51 AM) Procedures  Name Value Date Echocardiography, transthoracic, real-time with image documentation (2D), includes M-mode recording, when  performed, complete, with spectral Doppler echocardiography, and with color flow Doppler echocardiography ([id number]) Comments: Echocardiogram 08/26/2017: Left ventricle cavity is normal in size. Mild concentric hypertrophy of the left ventricle. Normal global wall motion. Visual EF is 50-55%. Doppler evidence of grade I (impaired) diastolic dysfunction. Calculated EF 55%. Left atrial cavity is moderately dilated. Mild (Grade I) aortic regurgitation. Moderate aortic valve leaflet calcification. Severe aortic valve stenosis. Aortic valve mean gradient of 44 mmHg, Vmax of 4.1 m/s. Calculated aortic valve area by continuity equation is 0.9 cm. Compared to previous study in 01/2016, there is progression of aortic stenosis. No evidence of pulmonary hypertension.  Performed: 08/19/2017 3:46 PM    Assessment & Plan Kara Lee; 08/31/2017 5:51 AM) Severe aortic stenosis (I35.0) Story: EKG 07/28/2017: Sinus rhythm 64 bpm. Leftward axis. Left ventricular hypertrophy. Poor R-wave progression.  Echocardiogram 08/26/2017: Left ventricle cavity is normal in size. Mild concentric hypertrophy of the left ventricle. Normal global wall motion. Visual EF is 50-55%. Doppler evidence of grade I (impaired) diastolic dysfunction. Calculated EF 55%. Left atrial cavity is moderately dilated. Mild (Grade I) aortic regurgitation. Moderate aortic valve leaflet calcification. Severe aortic valve stenosis. Aortic valve mean gradient of 44 mmHg, Vmax of 4.1 m/s. Calculated aortic valve area by continuity equation is 0.9 cm. Compared to previous study in 01/2016, there is progression of aortic stenosis. No evidence of pulmonary hypertension. Impression: EKG 01/24/2017: Normal sinus rhythm at rate of 65 bpm, leftward axis, no evidence of ischemia. Borderline criteria for LVH. No significant change from EKG 12/31/2015. Dyspnea on exertion (R06.09) Story: Lexiscan myoview stress test 03/03/2015: 1. The resting  electrocardiogram demonstrated normal sinus rhythm, incomplete RBBB and no resting arrhythmias. Stress EKG is non-diagnostic for ischemia as it a pharmacologic stress using Lexiscan. Stress symptoms included dyspnea. 2. Myocardial perfusion imaging is normal. Overall left ventricular systolic function was normal without regional wall motion abnormalities. The left ventricular ejection fraction was 67%. Impression: EKG 12/31/2015: Normal sinus rhythm at rate of 62 bpm, left axis deviation. LVH. No evidence of ischemia. No significant change from EKG 02/04/2015. Bilateral carotid bruits (R09.89) Story: Carotid artery duplex 08/26/2017: Minimal stenosis in the bilateral internal carotid artery (1-15%). No significant plaque. Antegrade right vertebral artery flow. Antegrade left vertebral artery flow. F/U studies if clinically indicated. Hypercholesteremia (E78.00) Labwork Story: Labs 12/19/2015: Total cholesterol 163, triglycerides 142, HDL 54, LDL 103. Non-HDL cholesterol 109. A1c 7.6%.  05/23/2015: Total cholesterol 168, triglycerides 156,  HDL 57, LDL 80, LDL particle #1174, hepatic function normal  Labs 02/06/2015: Vitamin D 41.9, HbA1c 6.7%, total cholesterol 220, triglycerides 158, HDL 48, LDL 140, serum glucose 133, creatinine 0.62, potassium 4.5, CMP otherwise normal, CBC normal, TSH 1.680  Note:Recommendations:  Ms. Lakie Mclouth is a 72 year old Caucasian female with morbid obesity, diabetes mellitus, hyperlipidemia, moderate aortic stenosis, history of SVT who is had no recent recurrence, presents here for follow-up of aortic stenosis.  Over the past few months she has noticed marked dyspnea on exertion which has gotten worse associated with lightheadedness. She also has to stop after climbing stairs midway due to dyspnea. No associated chest pain, palpitations. Repeat echo on 08/26/2017 revealed progression of aortic stenosis to severe AS. Carotid bruit is conducted sounds. Lipids still not  at goal and she had not been able to tolerate statins but now on Livalo 2 mg and could add Zetia. DM is uncontrolled probably due to poor eating habbits.  Patient has had worsening dyspnea on exertion and is now having lightheadedness on occasion. Orthostatics have been negative in the past. I suspect related to her aortic stenosis. Schedule for TEE to better evaluate the structural abnormality. I have explained risks, benefits,alternatives to TEE, including but not limited to rare esophageal perforation, bleeding, aspiration pneumonia. Extensive discussion regarding the importance of weight loss especially in view of possibly needing aortic valve surgery in the near future to decrease her risk postoperativley. Discussed diet modifications. No changes were made to medications today. We will see her back after the TEE for further recommendations and improve compliance with weight loss. If severe AS is confirmed, will proceed with coronary angiogram and right heart catheterization. Schedule for cardiac catheterization, and possible angioplasty. We discussed regarding risks, benefits, alternatives to this including stress testing, CTA and continued medical therapy. Patient wants to proceed. Understands <1-2% risk of death, stroke, MI, urgent CABG, bleeding, infection, renal failure but not limited to these.   CC: Reita Cliche, Lee (PCP)    Signed by Kara Page, Lee (08/31/2017 5:51 AM)

## 2017-08-31 NOTE — Progress Notes (Signed)
  Echocardiogram Echocardiogram Transesophageal has been performed.  Kara Lee 08/31/2017, 11:44 AM

## 2017-08-31 NOTE — Interval H&P Note (Signed)
History and Physical Interval Note:  08/31/2017 10:51 AM  Kara Lee  has presented today for surgery, with the diagnosis of aortic stenosis  The various methods of treatment have been discussed with the patient and family. After consideration of risks, benefits and other options for treatment, the patient has consented to  Procedure(s): TRANSESOPHAGEAL ECHOCARDIOGRAM (TEE) (N/A) as a surgical intervention .  The patient's history has been reviewed, patient examined, no change in status, stable for surgery.  I have reviewed the patient's chart and labs.  Questions were answered to the patient's satisfaction.     Adrian Prows

## 2017-08-31 NOTE — Discharge Instructions (Signed)
TEE   YOU HAD AN CARDIAC PROCEDURE TODAY: Refer to the procedure report and other information in the discharge instructions given to you for any specific questions about what was found during the examination. If this information does not answer your questions, please call Dr. Irven Shelling office at [phone] to clarify.   DIET: Your first meal following the procedure should be a light meal and then it is ok to progress to your normal diet. A half-sandwich or bowl of soup is an example of a good first meal. Heavy or fried foods are harder to digest and may make you feel nauseous or bloated. Drink plenty of fluids but you should avoid alcoholic beverages for 24 hours. If you had a esophageal dilation, please see attached instructions for diet.   ACTIVITY: Your care partner should take you home directly after the procedure. You should plan to take it easy, moving slowly for the rest of the day. You can resume normal activity the day after the procedure however YOU SHOULD NOT DRIVE, use power tools, machinery or perform tasks that involve climbing or major physical exertion for 24 hours (because of the sedation medicines used during the test).   SYMPTOMS TO REPORT IMMEDIATELY: A cardiologist can be reached at any hour. Please call [phone] for any of the following symptoms:  Vomiting of blood or coffee ground material  New, significant abdominal pain  New, significant chest pain or pain under the shoulder blades  Painful or persistently difficult swallowing  New shortness of breath  Black, tarry-looking or red, bloody stools  FOLLOW UP:  Please also call with any specific questions about appointments or follow up tests. Moderate Conscious Sedation, Adult, Care After These instructions provide you with information about caring for yourself after your procedure. Your health care provider may also give you more specific instructions. Your treatment has been planned according to current medical  practices, but problems sometimes occur. Call your health care provider if you have any problems or questions after your procedure. What can I expect after the procedure? After your procedure, it is common:  To feel sleepy for several hours.  To feel clumsy and have poor balance for several hours.  To have poor judgment for several hours.  To vomit if you eat too soon.  Follow these instructions at home: For at least 24 hours after the procedure:   Do not: ? Participate in activities where you could fall or become injured. ? Drive. ? Use heavy machinery. ? Drink alcohol. ? Take sleeping pills or medicines that cause drowsiness. ? Make important decisions or sign legal documents. ? Take care of children on your own.  Rest. Eating and drinking  Follow the diet recommended by your health care provider.  If you vomit: ? Drink water, juice, or soup when you can drink without vomiting. ? Make sure you have little or no nausea before eating solid foods. General instructions  Have a responsible adult stay with you until you are awake and alert.  Take over-the-counter and prescription medicines only as told by your health care provider.  If you smoke, do not smoke without supervision.  Keep all follow-up visits as told by your health care provider. This is important. Contact a health care provider if:  You keep feeling nauseous or you keep vomiting.  You feel light-headed.  You develop a rash.  You have a fever. Get help right away if:  You have trouble breathing. This information is not intended to replace advice given  to you by your health care provider. Make sure you discuss any questions you have with your health care provider. Document Released: 01/24/2013 Document Revised: 09/08/2015 Document Reviewed: 07/26/2015 Elsevier Interactive Patient Education  [employer].

## 2017-09-01 ENCOUNTER — Encounter (HOSPITAL_COMMUNITY): Payer: Self-pay | Admitting: Cardiology

## 2017-09-07 ENCOUNTER — Ambulatory Visit
Admission: RE | Admit: 2017-09-07 | Discharge: 2017-09-07 | Disposition: A | Discharge status: Home / Self Care | Payer: Medicare Other | Source: Ambulatory Visit | Attending: Physical Medicine and Rehabilitation | Admitting: Physical Medicine and Rehabilitation

## 2017-09-07 DIAGNOSIS — M5124 Other intervertebral disc displacement, thoracic region: Secondary | ICD-10-CM

## 2017-09-07 MED ORDER — IOPAMIDOL (ISOVUE-M 300) INJECTION 61%
1.0000 mL | Freq: Once | INTRAMUSCULAR | Status: AC | PRN
  Administered 2017-09-07: 1 mL via EPIDURAL

## 2017-09-07 MED ORDER — TRIAMCINOLONE ACETONIDE 40 MG/ML IJ SUSP (RADIOLOGY)
60.0000 mg | Freq: Once | INTRAMUSCULAR | Status: AC
  Administered 2017-09-07: 60 mg via EPIDURAL

## 2017-09-15 ENCOUNTER — Other Ambulatory Visit: Payer: Self-pay | Admitting: Physical Medicine and Rehabilitation

## 2017-10-02 DIAGNOSIS — Z9989 Dependence on other enabling machines and devices: Secondary | ICD-10-CM

## 2017-10-02 DIAGNOSIS — G4733 Obstructive sleep apnea (adult) (pediatric): Secondary | ICD-10-CM

## 2017-10-02 DIAGNOSIS — R9439 Abnormal result of other cardiovascular function study: Secondary | ICD-10-CM

## 2017-10-02 NOTE — H&P (Signed)
Kara Lee 09/26/2017 2:15 PM Location: [facility] Patient #: [id number] DOB: 06/16/1945 Married / Language: English / Race: White Female   History of Present Illness Kara Lee; 09/26/2017 2:48 PM) Patient words: Last o/v 09/08/17; F/u nuc results.  The patient is a 72 year old female who presents for a Follow-up for Aortic stenosis. Kara Lee is a Caucasian female with morbid obesity, diabetes mellitus, hyperlipidemia, unable to tolerate statins except has been tolerating Livalo, history of SVT with no recurrence. Due to worsening shortness of breath, echocardiogram revealing severe aortic stenosis, she underwent. On 09/01/2017 which revealed moderate aortic stenosis. She now presents here for follow-up on the results of her stress test.  She states her fatigue has worsened slightly since her last visit. She uses a stationary bike for 20 minutes daily without symptoms of shortness of breath or chest pain. But she mentions if she really over exerts herself she can begin feeling short of breath. She states she had one episode of presyncope one or two months ago in which she felt lightheaded however she did not lose consciousness. She states occasionally when she stands quickly she will have dizziness and feel like her head is throbbing. She has not lost any weight since previous visit, she has been making dietary changes cotinuously, and mentions she is really making an effort to lose the weight. She feels as though it is difficult to breathe when lying down flat.   Problem List/Past Medical Kara Lee; 09/26/2017 2:21 PM) GERD (gastroesophageal reflux disease) (K21.9)  Did not keep GI appointment 03/04/2016: Severe aortic stenosis (I35.0)  EKG 07/28/2017: Sinus rhythm 64 bpm. Leftward axis. Left ventricular hypertrophy. Poor R-wave progression. TEE 09/01/2017: Normal LVEF. Mod AS AVA 1.01 cm,mean gradient 37 mmHg. Moderate atherosclerotic changes of the thoracic aorta  especially the arch. Echocardiogram 08/26/2017: Left ventricle cavity is normal in size. Mild concentric hypertrophy of the left ventricle. Normal global wall motion. Visual EF is 50-55%. Doppler evidence of grade I (impaired) diastolic dysfunction. Calculated EF 55%. Left atrial cavity is moderately dilated. Mild (Grade I) aortic regurgitation. Moderate aortic valve leaflet calcification. Severe aortic valve stenosis. Aortic valve mean gradient of 44 mmHg, Vmax of 4.1 m/s. Calculated aortic valve area by continuity equation is 0.9 cm. Compared to previous study in 01/2016, there is progression of aortic stenosis. No evidence of pulmonary hypertension. History of supraventricular tachycardia (Z86.79)  Morbid obesity with BMI of 45.0-49.9, adult (E66.01)  Hypercholesteremia (E78.00)  Controlled type 2 diabetes mellitus without complication, without long-term current use of insulin (E11.9)  Obstructive sleep apnea, adult (G47.33)  Bilateral carotid bruits (R09.89)  Carotid artery duplex 08/26/2017: Minimal stenosis in the bilateral internal carotid artery (1-15%). No significant plaque. Antegrade right vertebral artery flow. Antegrade left vertebral artery flow. F/U studies if clinically indicated. Dyspnea on exertion (R06.09)  Lexiscan myoview stress test 09/16/2017: 1. Lexiscan stress test was performed. Exercise capacity was not assessed. Stress symptoms included dyspnea, dizziness and headache. Peak blood pressure 168/90 mmHg. Stress EKG is non diagnostic for ischemia as it is a pharmacologic stress. The stress electrocardiogram demonstrated sinus tachycardia, normal resting conduction, left ventricular hypertrophy, no resting arrhythmias and normal rest repolarization. 2. The overall quality of the study is good. Left ventricular cavity is noted to be normal on the rest and stress studies. Gated SPECT images reveal normal myocardial thickening and wall motion. The left ventricular ejection fraction  was calculated or visually estimated to be 69%. SPECT images demonstrate predominantly reversible, medium sized,  mild intensity perfusion defect. This likely suggests LCx/RPDA territory ischemia. 3. Intermediate risk study. Arthritis (M19.90)  Labwork  09/15/2017: Cholesterol 164, triglycerides 117, HDL 57, LDL 84. Glucose 118, creatinine 0.82, EGFR 73/84, potassium 4.7, CMP normal. Labs 12/19/2015: Total cholesterol 163, triglycerides 142, HDL 54, LDL 103. Non-HDL cholesterol 109. A1c 7.6%. 05/23/2015: Total cholesterol 168, triglycerides 156, HDL 57, LDL 80, LDL particle #[id number], hepatic function normal Labs 02/06/2015: Vitamin D 41.9, HbA1c 6.7%, total cholesterol 220, triglycerides 158, HDL 48, LDL 140, serum glucose 133, creatinine 0.62, potassium 4.5, CMP otherwise normal, CBC normal, TSH 1.680  Allergies Kara Lee; October 13, 2017 2:21 PM) Latex  Itching, Swelling. Lovenox *ANTICOAGULANTS*  Minocycline HCl *TETRACYCLINES*  Nausea, Vomiting. Morphine Derivatives  Vomiting. Sulfa Antibiotics  Itching, Vomiting. Tape 1"X5yd *MEDICAL DEVICES AND SUPPLIES*  Itching. Statins  myalgia  Family History Kara Lee; 10-13-2017 2:21 PM) Mother  Deceased. at age 64 from [place]; No heart attacks or strokes, DM but no other cardiovascular conditions Father  Deceased. No information known about Biological Father Sister 1  half-sister, younger  Social History Kara Lee; October 13, 2017 2:21 PM) Current tobacco use  Never smoker. Non Drinker/No Alcohol Use  Marital status  Married. Living Situation  Lives with spouse. Number of Children  2.  Past Surgical History Kara Lee; 2017/10/13 2:21 PM) Eye Surgery [1956]: x2 Bilateral Breast Biopsy [1983]: Tubal Ligation [1988]: Cesarean Delivery [1988]: 1977 Ankle Surgery [02/1993]: Lasix Surgery [1996]: Cholecystectomy [1998]: Inguinal Hernia Repair [2002]: Hysterectomy; Abdominal [2002]: Not due to Cancer; partial Rt  Knee Arthroplasty [07/2013]: Cataract Extraction-Bilateral [11/2015]:  Medication History Laverda Page, MD; 09/27/2017 6:26 AM) Atenolol (50MG Tablet, 1/2 Tablet Tablet Oral twice daily, Taken starting 05/30/2017) Active. Livalo (2MG Tablet, 1 (one) Tablet Tablet Tablet T Oral daily, Taken starting 03/18/2017) Active. (pa APPROVED 03/07/2017-03/07/2018) Estrace (0.1MG/GM Cream, 1 Vaginal weekly) Active. Aspirin (81MG Tablet, 1 Oral daily) Active. ClonazePAM (0.5MG Tablet, 1 Oral every 12 hours) Active. FLUoxetine HCl (40MG Capsule, 1 Oral daily) Active. MetFORMIN HCl (500MG Tablet, 2 Oral daily) Active. Fluticasone Propionate (50MCG/ACT Suspension, 1 Spray in each nostrils Nasal daily as needed) Active. EpiPen 2-Pak (0.3MG/0.3ML Soln Auto-inj, Injection use as directed) Active. Esomeprazole Magnesium (40MG Capsule DR, 1 Oral daily) Active. Medications Reconciled (verbally with pt; no list or medications present)  Diagnostic Studies History Kara Lee; 10-13-17 2:16 PM) Nuclear stress test  Lexiscan myoview stress test 09/16/2017: 1. Lexiscan stress test was performed. Exercise capacity was not assessed. Stress symptoms included dyspnea, dizziness and headache. Peak blood pressure 168/90 mmHg. Stress EKG is non diagnostic for ischemia as it is a pharmacologic stress. The stress electrocardiogram demonstrated sinus tachycardia, normal resting conduction, left ventricular hypertrophy, no resting arrhythmias and normal rest repolarization. 2. The overall quality of the study is good. Left ventricular cavity is noted to be normal on the rest and stress studies. Gated SPECT images reveal normal myocardial thickening and wall motion. The left ventricular ejection fraction was calculated or visually estimated to be 69%. SPECT images demonstrate predominantly reversible, medium sized, mild intensity perfusion defect. This likely suggests LCx/RPDA territory ischemia. 3.  Intermediate risk study.    Review of Systems Kara Lee; 10/13/17 2:49 PM) General Present- Fatigue. Not Present- Anorexia and Fever. Respiratory Present- Decreased Exercise Tolerance and Difficulty Breathing on Exertion (worsening). Not Present- Cough. Cardiovascular Present- Difficulty Breathing On Exertion and Orthopnea. Not Present- Chest Pain, Claudications, Edema and Paroxysmal Nocturnal Dyspnea. Gastrointestinal Not Present- Change in Bowel Habits, Constipation and Nausea. Musculoskeletal Not Present- Back Pain. Neurological Present- Dizziness.  Not Present- Focal Neurological Symptoms. Endocrine Not Present- Appetite Changes, Cold Intolerance and Heat Intolerance. Hematology Not Present- Anemia, Petechiae and Prolonged Bleeding. All other systems negative  Vitals Kara Lee; 09/26/2017 2:29 PM) 09/26/2017 2:23 PM Weight: 239.13 lb Height: 62in Body Surface Area: 2.06 m Body Mass Index: 43.74 kg/m  Pulse: 64 (Regular)  P.OX: 97% (Room air) BP: 124/54 (Sitting, Left Arm, Standard)       Physical Exam Laverda Page, MD; 09/27/2017 6:25 AM) General Mental Status-Alert. General Appearance-Cooperative, Appears stated age, Not in acute distress. Orientation-Oriented X3. Build & Nutrition-Moderately built and Morbidly obese.  Head and Neck Thyroid Gland Characteristics - no palpable nodules, no palpable enlargement.  Chest and Lung Exam Chest and lung exam reveals -quiet, even and easy respiratory effort with no use of accessory muscles and on auscultation, normal breath sounds, no adventitious sounds.  Cardiovascular Inspection Jugular vein - Right - No Distention. Auscultation Heart Sounds - S1 WNL, S2 Diminished intensity and No gallop present. Murmurs & Other Heart Sounds: Murmur - Location - Aortic Area. Timing - Early systolic. Grade - III/VI. Character - Rumbling and Crescendo/Decrescendo. Radiation -  Carotids.  Abdomen Palpation/Percussion Normal exam - Non Tender and No hepatosplenomegaly. Auscultation Normal exam - Bowel sounds normal.  Peripheral Vascular Lower Extremity Inspection - Left - Varicose veins, No Pigmentation. Right - Varicose veins, No Pigmentation. Palpation - Edema - Bilateral - No edema. Femoral pulse - Bilateral - Feeble(Pulsus difficult to feel due to patient's bodily habitus.). Popliteal pulse - Bilateral - Feeble(Pulsus difficult to feel due to patient's bodily habitus.). Dorsalis pedis pulse - Bilateral - Normal. Posterior tibial pulse - Bilateral - Absent. Carotid arteries - Bilateral-Soft Bruit. Abdomen-No prominent abdominal aortic pulsation, No epigastric bruit.  Neurologic Motor-Grossly intact without any focal deficits.  Musculoskeletal - Did not examine.    Assessment & Plan Laverda Page MD; 09/27/2017 6:26 AM) Moderate aortic stenosis (I35.0) Story: EKG 07/28/2017: Sinus rhythm 64 bpm. Leftward axis. Left ventricular hypertrophy. Poor R-wave progression.  TEE 09/01/2017: Normal LVEF. Mod AS AVA 1.01 cm,mean gradient 37 mmHg. Moderate atherosclerotic changes of the thoracic aorta especially the arch.  Echocardiogram 08/26/2017: Left ventricle cavity is normal in size. Mild concentric hypertrophy of the left ventricle. Normal global wall motion. Visual EF is 50-55%. Doppler evidence of grade I (impaired) diastolic dysfunction. Calculated EF 55%. Left atrial cavity is moderately dilated. Mild (Grade I) aortic regurgitation. Moderate aortic valve leaflet calcification. Severe aortic valve stenosis. Aortic valve mean gradient of 44 mmHg, Vmax of 4.1 m/s. Calculated aortic valve area by continuity equation is 0.9 cm. Compared to previous study in 01/2016, there is progression of aortic stenosis. No evidence of pulmonary hypertension. Impression: EKG 01/24/2017: Normal sinus rhythm at rate of 65 bpm, leftward axis, no evidence of ischemia.  Borderline criteria for LVH. No significant change from EKG 12/31/2015. Dyspnea on exertion (R06.09) Story: Lexiscan myoview stress test 09/16/2017: 1. Lexiscan stress test was performed. Exercise capacity was not assessed. Stress symptoms included dyspnea, dizziness and headache. Peak blood pressure 168/90 mmHg. Stress EKG is non diagnostic for ischemia as it is a pharmacologic stress. The stress electrocardiogram demonstrated sinus tachycardia, normal resting conduction, left ventricular hypertrophy, no resting arrhythmias and normal rest repolarization. 2. The overall quality of the study is good. Left ventricular cavity is noted to be normal on the rest and stress studies. Gated SPECT images reveal normal myocardial thickening and wall motion. The left ventricular ejection fraction was calculated or visually estimated to be 69%.  SPECT images demonstrate predominantly reversible, medium sized, mild intensity perfusion defect. This likely suggests LCx/RPDA territory ischemia. 3. Intermediate risk study. Impression: EKG 12/31/2015: Normal sinus rhythm at rate of 62 bpm, left axis deviation. LVH. No evidence of ischemia. No significant change from EKG 02/04/2015. Bilateral carotid bruits (R09.89) Story: Carotid artery duplex 08/26/2017: Minimal stenosis in the bilateral internal carotid artery (1-15%). No significant plaque. Antegrade right vertebral artery flow. Antegrade left vertebral artery flow. F/U studies if clinically indicated. Hypercholesteremia (E78.00) Controlled type 2 diabetes mellitus without complication, without long-term current use of insulin (E11.9) Labwork Story: 09/15/2017: Cholesterol 164, triglycerides 117, HDL 57, LDL 84. Glucose 118, creatinine 0.82, EGFR 73/84, potassium 4.7, CMP normal.  Labs 12/19/2015: Total cholesterol 163, triglycerides 142, HDL 54, LDL 103. Non-HDL cholesterol 109. A1c 7.6%.  05/23/2015: Total cholesterol 168, triglycerides 156, HDL 57, LDL 80, LDL  particle #[id number], hepatic function normal  Labs 02/06/2015: Vitamin D 41.9, HbA1c 6.7%, total cholesterol 220, triglycerides 158, HDL 48, LDL 140, serum glucose 133, creatinine 0.62, potassium 4.5, CMP otherwise normal, CBC normal, TSH 1.680 Nuclear stress test Story: Lexiscan myoview stress test 09/16/2017: 1. Lexiscan stress test was performed. Exercise capacity was not assessed. Stress symptoms included dyspnea, dizziness and headache. Peak blood pressure 168/90 mmHg. Stress EKG is non diagnostic for ischemia as it is a pharmacologic stress. The stress electrocardiogram demonstrated sinus tachycardia, normal resting conduction, left ventricular hypertrophy, no resting arrhythmias and normal rest repolarization. 2. The overall quality of the study is good. Left ventricular cavity is noted to be normal on the rest and stress studies. Gated SPECT images reveal normal myocardial thickening and wall motion. The left ventricular ejection fraction was calculated or visually estimated to be 69%. SPECT images demonstrate predominantly reversible, medium sized, mild intensity perfusion defect. This likely suggests LCx/RPDA territory ischemia. 3. Intermediate risk study.  Note:Recommendations:  Ms. Jodee Wagenaar is a 72 year old Caucasian female with morbid obesity, diabetes mellitus, hyperlipidemia, moderate aortic stenosis, history of SVT who is had no recent recurrence, presents here for follow-up of Lexiscan stress test. She is very concerned about persistent marked dyspnea that is changed his past 2-3 months and is worried about cardiac status at this was a drastic change. She continues to have class III dyspnea.  Lexiscan stress test demonstrated an intermediate risk study, likely suggests LCx/RPDA territory ischemia. Results of the stress test were discussed with the patient at length. Patient was given the option to have heart catheterization or continue with lifestyle and dietary modifications. Risks  of heart catheterization were explained to patient at length, and patient elected to have a heart catheterization performed. patient was also told that depending on the results of the heart catheterization, bypass surgery with valve replacement may be indicated. Carotid duplex does not reveal significant disease.  Patient was encouraged to continue performing her routine activity but not to push herself until evaluation complete. Dietary modification and daily exercise bike. Patient was educated to continue to attempt to lose weight even before the heart catheterization. Office visit after the tests. Patient examined in conjunction with PA student Lilyan Punt.  CBC: PCP Mena Goes, M.D.    Signed by Laverda Page, MD (09/27/2017 6:26 AM)

## 2017-10-04 ENCOUNTER — Ambulatory Visit (HOSPITAL_COMMUNITY)
Admission: RE | Admit: 2017-10-04 | Discharge: 2017-10-04 | Disposition: A | Discharge status: Home / Self Care | Payer: Medicare Other | Source: Ambulatory Visit | Attending: Cardiology | Admitting: Cardiology

## 2017-10-04 ENCOUNTER — Ambulatory Visit (HOSPITAL_COMMUNITY)
Admission: RE | Disposition: A | Discharge status: Home / Self Care | Payer: Self-pay | Source: Ambulatory Visit | Attending: Cardiology

## 2017-10-04 DIAGNOSIS — Z9989 Dependence on other enabling machines and devices: Secondary | ICD-10-CM

## 2017-10-04 DIAGNOSIS — R0609 Other forms of dyspnea: Secondary | ICD-10-CM | POA: Insufficient documentation

## 2017-10-04 DIAGNOSIS — Z9842 Cataract extraction status, left eye: Secondary | ICD-10-CM | POA: Diagnosis not present

## 2017-10-04 DIAGNOSIS — Z888 Allergy status to other drugs, medicaments and biological substances status: Secondary | ICD-10-CM | POA: Diagnosis not present

## 2017-10-04 DIAGNOSIS — Z90711 Acquired absence of uterus with remaining cervical stump: Secondary | ICD-10-CM | POA: Insufficient documentation

## 2017-10-04 DIAGNOSIS — Z7984 Long term (current) use of oral hypoglycemic drugs: Secondary | ICD-10-CM | POA: Diagnosis not present

## 2017-10-04 DIAGNOSIS — E7800 Pure hypercholesterolemia, unspecified: Secondary | ICD-10-CM | POA: Insufficient documentation

## 2017-10-04 DIAGNOSIS — Z7982 Long term (current) use of aspirin: Secondary | ICD-10-CM | POA: Diagnosis not present

## 2017-10-04 DIAGNOSIS — Z9889 Other specified postprocedural states: Secondary | ICD-10-CM | POA: Diagnosis not present

## 2017-10-04 DIAGNOSIS — Z79899 Other long term (current) drug therapy: Secondary | ICD-10-CM | POA: Insufficient documentation

## 2017-10-04 DIAGNOSIS — I6523 Occlusion and stenosis of bilateral carotid arteries: Secondary | ICD-10-CM | POA: Diagnosis not present

## 2017-10-04 DIAGNOSIS — Z91040 Latex allergy status: Secondary | ICD-10-CM | POA: Insufficient documentation

## 2017-10-04 DIAGNOSIS — Z885 Allergy status to narcotic agent status: Secondary | ICD-10-CM | POA: Insufficient documentation

## 2017-10-04 DIAGNOSIS — G4733 Obstructive sleep apnea (adult) (pediatric): Secondary | ICD-10-CM

## 2017-10-04 DIAGNOSIS — Z6841 Body Mass Index (BMI) 40.0 and over, adult: Secondary | ICD-10-CM | POA: Insufficient documentation

## 2017-10-04 DIAGNOSIS — Z9049 Acquired absence of other specified parts of digestive tract: Secondary | ICD-10-CM | POA: Diagnosis not present

## 2017-10-04 DIAGNOSIS — R9439 Abnormal result of other cardiovascular function study: Secondary | ICD-10-CM

## 2017-10-04 DIAGNOSIS — I350 Nonrheumatic aortic (valve) stenosis: Secondary | ICD-10-CM | POA: Diagnosis not present

## 2017-10-04 DIAGNOSIS — M1990 Unspecified osteoarthritis, unspecified site: Secondary | ICD-10-CM | POA: Diagnosis not present

## 2017-10-04 DIAGNOSIS — Z882 Allergy status to sulfonamides status: Secondary | ICD-10-CM | POA: Diagnosis not present

## 2017-10-04 DIAGNOSIS — Z9841 Cataract extraction status, right eye: Secondary | ICD-10-CM | POA: Diagnosis not present

## 2017-10-04 DIAGNOSIS — E119 Type 2 diabetes mellitus without complications: Secondary | ICD-10-CM | POA: Diagnosis not present

## 2017-10-04 HISTORY — PX: RIGHT/LEFT HEART CATH AND CORONARY ANGIOGRAPHY: CATH118266

## 2017-10-04 LAB — CBC
HCT: 40.4 % (ref 36.0–46.0)
Hemoglobin: 12.8 g/dL (ref 12.0–15.0)
MCH: 29.4 pg (ref 26.0–34.0)
MCHC: 31.7 g/dL (ref 30.0–36.0)
MCV: 92.7 fL (ref 78.0–100.0)
Platelets: 217 10*3/uL (ref 150–400)
RBC: 4.36 MIL/uL (ref 3.87–5.11)
RDW: 12.8 % (ref 11.5–15.5)
WBC: 9.3 10*3/uL (ref 4.0–10.5)

## 2017-10-04 LAB — POCT I-STAT 3, ART BLOOD GAS (G3+)
Acid-base deficit: 2 mmol/L (ref 0.0–2.0)
Bicarbonate: 22.9 mmol/L (ref 20.0–28.0)
O2 Saturation: 98 %
TCO2: 24 mmol/L (ref 22–32)
pCO2 arterial: 40.1 mmHg (ref 32.0–48.0)
pH, Arterial: 7.365 (ref 7.350–7.450)
pO2, Arterial: 102 mmHg (ref 83.0–108.0)

## 2017-10-04 LAB — GLUCOSE, CAPILLARY: Glucose-Capillary: 120 mg/dL — ABNORMAL HIGH (ref 65–99)

## 2017-10-04 LAB — POCT I-STAT 3, VENOUS BLOOD GAS (G3P V)
Acid-base deficit: 2 mmol/L (ref 0.0–2.0)
Bicarbonate: 24 mmol/L (ref 20.0–28.0)
O2 Saturation: 64 %
TCO2: 25 mmol/L (ref 22–32)
pCO2, Ven: 42.9 mmHg — ABNORMAL LOW (ref 44.0–60.0)
pH, Ven: 7.355 (ref 7.250–7.430)
pO2, Ven: 35 mmHg (ref 32.0–45.0)

## 2017-10-04 SURGERY — RIGHT/LEFT HEART CATH AND CORONARY ANGIOGRAPHY
Anesthesia: LOCAL

## 2017-10-04 MED ORDER — METFORMIN HCL ER 500 MG PO TB24: 500.0000 mg | ORAL_TABLET | Freq: Two times a day (BID) | ORAL | Status: DC

## 2017-10-04 MED ORDER — SODIUM CHLORIDE 0.9% FLUSH: 3.0000 mL | Freq: Two times a day (BID) | INTRAVENOUS | Status: DC

## 2017-10-04 MED ORDER — ASPIRIN 81 MG PO CHEW
CHEWABLE_TABLET | ORAL | Status: AC
  Administered 2017-10-04: 81 mg via ORAL
  Filled 2017-10-04: qty 1

## 2017-10-04 MED ORDER — SODIUM CHLORIDE 0.9% FLUSH: 3.0000 mL | INTRAVENOUS | Status: DC | PRN

## 2017-10-04 MED ORDER — FENTANYL CITRATE (PF) 100 MCG/2ML IJ SOLN
INTRAMUSCULAR | Status: DC | PRN
  Administered 2017-10-04: 50 ug via INTRAVENOUS

## 2017-10-04 MED ORDER — HEPARIN (PORCINE) IN NACL 2-0.9 UNITS/ML
INTRAMUSCULAR | Status: AC | PRN
  Administered 2017-10-04: 1000 mL via INTRA_ARTERIAL

## 2017-10-04 MED ORDER — ACETAMINOPHEN 325 MG PO TABS: 650.0000 mg | ORAL_TABLET | ORAL | Status: DC | PRN

## 2017-10-04 MED ORDER — VERAPAMIL HCL 2.5 MG/ML IV SOLN
INTRAVENOUS | Status: AC
  Filled 2017-10-04: qty 2

## 2017-10-04 MED ORDER — HEPARIN (PORCINE) IN NACL 1000-0.9 UT/500ML-% IV SOLN
INTRAVENOUS | Status: AC
  Filled 2017-10-04: qty 1000

## 2017-10-04 MED ORDER — HEPARIN SODIUM (PORCINE) 1000 UNIT/ML IJ SOLN
INTRAMUSCULAR | Status: AC
  Filled 2017-10-04: qty 1

## 2017-10-04 MED ORDER — HEPARIN SODIUM (PORCINE) 1000 UNIT/ML IJ SOLN
INTRAMUSCULAR | Status: DC | PRN
  Administered 2017-10-04: 5000 [IU] via INTRAVENOUS

## 2017-10-04 MED ORDER — FENTANYL CITRATE (PF) 100 MCG/2ML IJ SOLN
INTRAMUSCULAR | Status: AC
  Filled 2017-10-04: qty 2

## 2017-10-04 MED ORDER — ONDANSETRON HCL 4 MG/2ML IJ SOLN: 4.0000 mg | Freq: Four times a day (QID) | INTRAMUSCULAR | Status: DC | PRN

## 2017-10-04 MED ORDER — LIDOCAINE HCL (PF) 1 % IJ SOLN
INTRAMUSCULAR | Status: AC
  Filled 2017-10-04: qty 30

## 2017-10-04 MED ORDER — IOPAMIDOL (ISOVUE-370) INJECTION 76%
INTRAVENOUS | Status: AC
  Filled 2017-10-04: qty 100

## 2017-10-04 MED ORDER — LIDOCAINE HCL (PF) 1 % IJ SOLN
INTRAMUSCULAR | Status: DC | PRN
  Administered 2017-10-04: 2 mL

## 2017-10-04 MED ORDER — NITROGLYCERIN 1 MG/10 ML FOR IR/CATH LAB
INTRA_ARTERIAL | Status: AC
  Filled 2017-10-04: qty 10

## 2017-10-04 MED ORDER — VERAPAMIL HCL 2.5 MG/ML IV SOLN
INTRAVENOUS | Status: DC | PRN
  Administered 2017-10-04: 13:00:00 via INTRA_ARTERIAL

## 2017-10-04 MED ORDER — SODIUM CHLORIDE 0.9 % IV SOLN
INTRAVENOUS | Status: DC
  Administered 2017-10-04: 11:00:00 via INTRAVENOUS

## 2017-10-04 MED ORDER — SODIUM CHLORIDE 0.9 % WEIGHT BASED INFUSION: 1.0000 mL/kg/h | INTRAVENOUS | Status: DC

## 2017-10-04 MED ORDER — IOPAMIDOL (ISOVUE-370) INJECTION 76%
INTRAVENOUS | Status: DC | PRN
  Administered 2017-10-04: 40 mL via INTRA_ARTERIAL

## 2017-10-04 MED ORDER — MIDAZOLAM HCL 2 MG/2ML IJ SOLN
INTRAMUSCULAR | Status: AC
  Filled 2017-10-04: qty 2

## 2017-10-04 MED ORDER — ASPIRIN 81 MG PO CHEW
81.0000 mg | CHEWABLE_TABLET | ORAL | Status: AC
Start: 2017-10-05 — End: 2017-10-04
  Administered 2017-10-04: 81 mg via ORAL

## 2017-10-04 MED ORDER — SODIUM CHLORIDE 0.9 % IV SOLN: 250.0000 mL | INTRAVENOUS | Status: DC | PRN

## 2017-10-04 MED ORDER — MIDAZOLAM HCL 2 MG/2ML IJ SOLN
INTRAMUSCULAR | Status: DC | PRN
  Administered 2017-10-04: 2 mg via INTRAVENOUS

## 2017-10-04 SURGICAL SUPPLY — 14 items
CATH BALLN WEDGE 5F 110CM (CATHETERS) ×2 IMPLANT
CATH OPTITORQUE TIG 4.0 5F (CATHETERS) ×2 IMPLANT
COVER PRB 48X5XTLSCP FOLD TPE (BAG) ×1 IMPLANT
COVER PROBE 5X48 (BAG) ×1
DEVICE RAD COMP TR BAND LRG (VASCULAR PRODUCTS) ×2 IMPLANT
GLIDESHEATH SLEND A-KIT 6F 20G (SHEATH) ×2 IMPLANT
GUIDEWIRE INQWIRE 1.5J.035X260 (WIRE) ×1 IMPLANT
INQWIRE 1.5J .035X260CM (WIRE) ×2
KIT HEART LEFT (KITS) ×2 IMPLANT
PACK CARDIAC CATHETERIZATION (CUSTOM PROCEDURE TRAY) ×2 IMPLANT
SHEATH GLIDE SLENDER 4/5FR (SHEATH) ×2 IMPLANT
TRANSDUCER W/STOPCOCK (MISCELLANEOUS) ×2 IMPLANT
TUBING CIL FLEX 10 FLL-RA (TUBING) ×2 IMPLANT
WIRE HI TORQ VERSACORE-J 145CM (WIRE) ×2 IMPLANT

## 2017-10-04 NOTE — Interval H&P Note (Signed)
History and Physical Interval Note:  10/04/2017 11:26 AM  Kara Lee  has presented today for surgery, with the diagnosis of as  The various methods of treatment have been discussed with the patient and family. After consideration of risks, benefits and other options for treatment, the patient has consented to  Procedure(s): RIGHT/LEFT HEART CATH AND CORONARY ANGIOGRAPHY (N/A)  And possible angioplasty as a surgical intervention .  The patient's history has been reviewed, patient examined, no change in status, stable for surgery.  I have reviewed the patient's chart and labs.  Questions were answered to the patient's satisfaction.    Symptom Status: Ischemic Symptoms Non-invasive Testing: Indeterminate If no or indeterminate stress test, FFR/iFR results in all diseased vessels: Not done Diabetes Mellitus: Yes S/P CABG: No Antianginal therapy (number of long-acting drugs): 1 Patient undergoing renal transplant: No Patient undergoing percutaneous valve procedure: No   1 Vessel Disease No proximal LAD involvement, No proximal left dominant LCX involvement  PCI: Not rated  CABG: Not rated Proximal left dominant LCX involvement  PCI: Not rated  CABG: Not rated Proximal LAD involvement  PCI: Not rated  CABG: Not rated  2 Vessel Disease No proximal LAD involvement  PCI: Not rated  CABG: Not rated Proximal LAD involvement  PCI: Not rated  CABG: Not rated  3 Vessel Disease Low disease complexity (e.g., focal stenoses, SYNTAX <=22)  PCI: Not rated  CABG: Not rated Intermediate or high disease complexity (e.g., SYNTAX >=23)  PCI: Not rated  CABG: Not rated  Left Main Disease Isolated LMCA disease: ostial or midshaft  PCI: A (7);  Indication 24  CABG: A (9);  Indication 24 Isolated LMCA disease: bifurcation involvement  PCI: M (5);  Indication 25  CABG: A (9);  Indication 25 LMCA ostial or midshaft, concurrent low disease burden multivessel disease (e.g., 1-2 additional  focal stenoses, SYNTAX <=22)  PCI: A (7);  Indication 26  CABG: A (9);  Indication 26 LMCA ostial or midshaft, concurrent intermediate or high disease burden multivessel disease (e.g., 1-2 additional bifurcation stenoses, long stenoses, SYNTAX >=23)  PCI: M (4);  Indication 27  CABG: A (9);  Indication 27 LMCA bifurcation involvement, concurrent low disease burden multivessel disease (e.g., 1-2 additional focal stenoses, SYNTAX <=22)  PCI: M (5);  Indication 28  CABG: A (9);  Indication 28 LMCA bifurcation involvement, concurrent intermediate or high disease burden multivessel disease (e.g., 1-2 additional bifurcation stenoses, long stenoses, SYNTAX >=23)  PCI: R (3);  Indication 29  CABG: A (9);  Indication 29  Notes:  A indicates appropriate. M indicates may be appropriate. R indicates rarely appropriate. Number in parentheses is median score for that indication. Reclassify indicates number of functionally diseased vessels should be decreased given negative FFR/iFR. Re-evaluate the scenario interpreting any FFR/iFR negative vessel as being not significantly stenosed.  Disease means involved vessel provides flow to a sufficient amount of myocardium to be clinically important.  If FFR testing indicates a vessel is not significant, that vessel should not be considered diseased (and the patient should be reclassified with respect to extent of functionally significant disease).  Proximal LAD + proximal left dominant LCX is considered 3 vessel CAD  2 Vessel CAD with FFR/iFR abnormal in only 1 but not both is considered 1 vessel CAD  Disease complexity includes occlusion, bifurcation, trifurcation, ostial, >20 mm, tortuosity, calcification, thrombus  LMCA disease is >=50% by angiography, MLD <2.8 mm, MLA <6 mm2; MLA 6-7.5 mm2 requires further physiologic  See Table  B for risk stratification based on noninvasive testing  Journal of the [employer] of Cardiology Mar 2017, 23391; DOI:  10.1016/j.jacc.2017.02.001 [email or web address].full-text.pdf This App  2018 by the Society for Cardiovascular Angiography and Interventions  Adrian Prows

## 2017-10-04 NOTE — Discharge Instructions (Signed)
°  DRINK PLENTY OF FLUIDS FOR THE NEXT 2-3 DAYS TO KEEP HYDRATED.   HOLD METFORMIN FOR A FULL 48 HOURS AFTER DISCHARGE.  Radial Site Care Refer to this sheet in the next few weeks. These instructions provide you with information about caring for yourself after your procedure. Your health care provider may also give you more specific instructions. Your treatment has been planned according to current medical practices, but problems sometimes occur. Call your health care provider if you have any problems or questions after your procedure. What can I expect after the procedure? After your procedure, it is typical to have the following:  Bruising at the radial site that usually fades within 1-2 weeks.  Blood collecting in the tissue (hematoma) that may be painful to the touch. It should usually decrease in size and tenderness within 1-2 weeks.  Follow these instructions at home:  Take medicines only as directed by your health care provider.  You may shower 24-48 hours after the procedure or as directed by your health care provider. Remove the bandage (dressing) and gently wash the site with plain soap and water. Pat the area dry with a clean towel. Do not rub the site, because this may cause bleeding.  Do not take baths, swim, or use a hot tub until your health care provider approves.  Check your insertion site every day for redness, swelling, or drainage.  Do not apply powder or lotion to the site.  Do not flex or bend the affected arm for 24 hours or as directed by your health care provider.  Do not push or pull heavy objects with the affected arm for 24 hours or as directed by your health care provider.  Do not lift over 10 lb (4.5 kg) for 5 days after your procedure or as directed by your health care provider.  Ask your health care provider when it is okay to: ? Return to work or school. ? Resume usual physical activities or sports. ? Resume sexual activity.  Do not drive home if  you are discharged the same day as the procedure. Have someone else drive you.  You may drive 24 hours after the procedure unless otherwise instructed by your health care provider.  Do not operate machinery or power tools for 24 hours after the procedure.  If your procedure was done as an outpatient procedure, which means that you went home the same day as your procedure, a responsible adult should be with you for the first 24 hours after you arrive home.  Keep all follow-up visits as directed by your health care provider. This is important. Contact a health care provider if:  You have a fever.  You have chills.  You have increased bleeding from the radial site. Hold pressure on the site. Get help right away if:  You have unusual pain at the radial site.  You have redness, warmth, or swelling at the radial site.  You have drainage (other than a small amount of blood on the dressing) from the radial site.  The radial site is bleeding, and the bleeding does not stop after 30 minutes of holding steady pressure on the site.  Your arm or hand becomes pale, cool, tingly, or numb. This information is not intended to replace advice given to you by your health care provider. Make sure you discuss any questions you have with your health care provider. Document Released: 05/08/2010 Document Revised: 09/11/2015 Document Reviewed: 10/22/2013 Elsevier Interactive Patient Education  2018 [employer].

## 2017-10-05 ENCOUNTER — Encounter (HOSPITAL_COMMUNITY): Payer: Self-pay | Admitting: Cardiology

## 2017-10-05 MED FILL — Nitroglycerin IV Soln 100 MCG/ML in D5W: INTRA_ARTERIAL | Qty: 10 | Status: AC

## 2017-10-05 MED FILL — Heparin Sod (Porcine)-NaCl IV Soln 1000 Unit/500ML-0.9%: INTRAVENOUS | Qty: 1000 | Status: AC

## 2018-05-21 ENCOUNTER — Other Ambulatory Visit: Payer: Self-pay | Admitting: Cardiology

## 2018-05-21 DIAGNOSIS — I350 Nonrheumatic aortic (valve) stenosis: Secondary | ICD-10-CM

## 2018-06-06 ENCOUNTER — Ambulatory Visit: Payer: Medicare Other

## 2018-06-06 DIAGNOSIS — I350 Nonrheumatic aortic (valve) stenosis: Secondary | ICD-10-CM

## 2018-06-13 NOTE — Progress Notes (Signed)
Called Pt and told her the results. 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 Stated she would have the procedure if it was possible to go through her artery. Also stated she was never referred to a surgeon.  Please Advise. Thank you!

## 2018-06-14 NOTE — Progress Notes (Signed)
Called Pt and let her know that you will be checking on the referral. Also stated that her next appt is 06/21/2018, and advised her to come to that appt to further discuss.

## 2018-06-21 ENCOUNTER — Ambulatory Visit: Payer: Medicare Other | Admitting: Cardiology

## 2018-06-21 ENCOUNTER — Encounter: Payer: Self-pay | Admitting: Cardiology

## 2018-06-21 VITALS — BP 124/64 | HR 65 | Ht 63.0 in | Wt 243.0 lb

## 2018-06-21 DIAGNOSIS — R42 Dizziness and giddiness: Secondary | ICD-10-CM | POA: Diagnosis not present

## 2018-06-21 DIAGNOSIS — R0600 Dyspnea, unspecified: Secondary | ICD-10-CM

## 2018-06-21 DIAGNOSIS — R0609 Other forms of dyspnea: Secondary | ICD-10-CM

## 2018-06-21 DIAGNOSIS — I350 Nonrheumatic aortic (valve) stenosis: Secondary | ICD-10-CM | POA: Diagnosis not present

## 2018-06-21 DIAGNOSIS — G4733 Obstructive sleep apnea (adult) (pediatric): Secondary | ICD-10-CM | POA: Insufficient documentation

## 2018-06-21 DIAGNOSIS — R0989 Other specified symptoms and signs involving the circulatory and respiratory systems: Secondary | ICD-10-CM | POA: Insufficient documentation

## 2018-06-21 NOTE — Progress Notes (Signed)
Subjective:   Kara Lee, female    DOB: [birth year], 73 y.o.   MRN: [id number]  Reita Cliche, MD:  Chief Complaint  Patient presents with  . Aortic Stenosis    echo results    HPI: Kara Lee  is a 73 y.o. female  with morbid obesity, diabetes mellitus, hyperlipidemia, unable to tolerate statins except has been tolerating Livalo, history of SVT with no recurrence. Due to worsening shortness of breath, echocardiogram revealing severe aortic stenosis, she underwent TEE on 09/01/2017 which revealed moderate aortic stenosis. Lexiscan nuclear stress testing on 09/16/2017 was considered intermediate risk study, underwent right and left heart catheterization on 10/04/2017 that revealed normal coronary arteries and no evidence of pulmonary hypertension.   Patient was last seen approximately 1 month ago for six-month follow-up for aortic stenosis.  Patient has been having worsening episodes of dizziness and dyspnea on exertion.  Aortic stenotic murmur was felt to be slightly more prominent.  She underwent repeat echocardiogram on 06/11/2018 that showed continued moderate to severe aortic stenosis with stable peak pressure gradient.  No significant changes compared to previous echocardiogram in May 2019.  She now presents discuss results.  Her husband is currently undergoing treatment for HER-2 positive breast cancer.  She states that he has now become depressed and dealing with this.  She is very concerned regarding her husband and states that she is not able to worry about herself for now as this is her main focus.     Past Medical History:  Diagnosis Date  . Aortic stenosis   . Arthritis   . Bicuspid aortic valve   . Bilateral carotid bruits   . Complication of anesthesia    Difficult time waking up; prefers spinal  . Concussion November 09, 2012   From a fall at home on deck  . Depression    postpartum depression after 2nd child  . Diabetes mellitus without complication  ([place])   . Dry skin   . Dysrhythmia    Tachycardia  . Family history of anesthesia complication    Makes son extremely hyper  . Fatty liver   . GERD (gastroesophageal reflux disease)   . Headache(784.0)    since having a concussion  . Heart murmur   . OSA (obstructive sleep apnea)   . Osteoarthritis of right knee 07/31/2013  . Pneumonia 2015  . PONV (postoperative nausea and vomiting)   . Rosacea   . Sensitive skin   . Shortness of breath    with exertion  . Sleep apnea    CPAP wears nightly  . Tachycardia     Past Surgical History:  Procedure Laterality Date  . ABDOMINAL HYSTERECTOMY     still has uterus  . ANKLE RECONSTRUCTION Left   . BIOPSY BREAST Bilateral   . CESAREAN SECTION     x 2  . CHOLECYSTECTOMY    . COLONOSCOPY    . EYE SURGERY     lasik; X3 for crossed eyes; 1 eyelid surgery  . HERNIA REPAIR    . PARTIAL KNEE ARTHROPLASTY Right 07/31/2013   Procedure: RIGHT UNICOMPARTMENTAL KNEE;  Surgeon: Johnny Bridge, MD;  Location: [facility];  Service: Orthopedics;  Laterality: Right;  . RIGHT/LEFT HEART CATH AND CORONARY ANGIOGRAPHY N/A 10/04/2017   Procedure: RIGHT/LEFT HEART CATH AND CORONARY ANGIOGRAPHY;  Surgeon: Adrian Prows, MD;  Location: [facility];  Service: Cardiovascular;  Laterality: N/A;  . TEE WITHOUT CARDIOVERSION N/A 08/31/2017   Procedure: TRANSESOPHAGEAL  ECHOCARDIOGRAM (TEE);  Surgeon: Adrian Prows, MD;  Location: [facility];  Service: Cardiovascular;  Laterality: N/A;  . TUBAL LIGATION      Family History  Problem Relation Age of Onset  . Pancreatitis Mother   . COPD Mother   . Cancer - Lung Mother   . Diabetes type II Mother     Social History   Socioeconomic History  . Marital status: Married    Spouse name: Not on file  . Number of children: Not on file  . Years of education: Not on file  . Highest education level: Not on file  Occupational History  . Not on file  Social Needs  . Financial resource strain: Not on file  . Food  insecurity:    Worry: Not on file    Inability: Not on file  . Transportation needs:    Medical: Not on file    Non-medical: Not on file  Tobacco Use  . Smoking status: Never Smoker  . Smokeless tobacco: Never Used  Substance and Sexual Activity  . Alcohol use: Yes    Comment: rare  . Drug use: No  . Sexual activity: Not on file  Lifestyle  . Physical activity:    Days per week: Not on file    Minutes per session: Not on file  . Stress: Not on file  Relationships  . Social connections:    Talks on phone: Not on file    Gets together: Not on file    Attends religious service: Not on file    Active member of club or organization: Not on file    Attends meetings of clubs or organizations: Not on file    Relationship status: Not on file  . Intimate partner violence:    Fear of current or ex partner: Not on file    Emotionally abused: Not on file    Physically abused: Not on file    Forced sexual activity: Not on file  Other Topics Concern  . Not on file  Social History Narrative  . Not on file    Current Meds  Medication Sig  . acetaminophen (TYLENOL) 650 MG CR tablet Take 1,300 mg by mouth every 8 (eight) hours as needed for pain.  Marland Kitchen amoxicillin (AMOXIL) 500 MG capsule Take 500 mg by mouth as needed. As needed for dental work  . aspirin EC 81 MG tablet Take 81 mg by mouth daily.  Marland Kitchen atenolol (TENORMIN) 25 MG tablet Take 25 mg by mouth 2 (two) times daily.   . clonazePAM (KLONOPIN) 0.5 MG tablet Take 0.5 mg by mouth 2 (two) times daily.  Marland Kitchen esomeprazole (NEXIUM) 40 MG capsule Take 40 mg by mouth daily.   Marland Kitchen estradiol (ESTRACE) 0.1 MG/GM vaginal cream Place 1 Applicatorful vaginally once a week.  Marland Kitchen FLUoxetine (PROZAC) 40 MG capsule Take 40 mg by mouth daily.  . hydrocortisone (ANUSOL-HC) 2.5 % rectal cream Place 1 application rectally 2 (two) times daily.  . metFORMIN (GLUCOPHAGE-XR) 500 MG 24 hr tablet Take 1 tablet (500 mg total) by mouth 2 (two) times daily.  Marland Kitchen Propylene  Glycol (SYSTANE BALANCE) 0.6 % SOLN Place 1 drop into both eyes daily as needed (dry eyes).     Review of Systems  Constitution: Negative for decreased appetite, malaise/fatigue, weight gain and weight loss.  Eyes: Negative for visual disturbance.  Cardiovascular: Positive for dyspnea on exertion. Negative for chest pain, claudication, leg swelling, orthopnea, palpitations and syncope.  Respiratory: Negative for hemoptysis, sleep disturbances  due to breathing and wheezing.   Endocrine: Negative for cold intolerance and heat intolerance.  Hematologic/Lymphatic: Negative for bleeding problem. Does not bruise/bleed easily.  Skin: Negative for nail changes.  Musculoskeletal: Negative for muscle weakness and myalgias.  Gastrointestinal: Negative for abdominal pain, change in bowel habit, nausea and vomiting.  Neurological: Positive for dizziness. Negative for difficulty with concentration, focal weakness and headaches.  Psychiatric/Behavioral: Negative for altered mental status and suicidal ideas.  All other systems reviewed and are negative.      Objective:     Blood pressure 124/64, pulse 65, height '5\' 3"'$  (1.6 m), weight 243 lb (110.2 kg), SpO2 97 %.  Cardiac studies:  Echocardiogram 06/06/2018: Left ventricle cavity is normal in size. Moderate concentric hypertrophy of the left ventricle. Mild decrease in global wall motion. Indeterminate diastolic filling pattern. Calculated EF 51%. Left atrial cavity is moderately dilated. Moderate aortic valve leaflet calcification. Moderate to severe aortic valve stenosis. Aortic valve mean gradient of 36 mmHg, Vmax of 3.9 m/s. Calculated aortic valve area by continuity equation is 1.0 cm, with AVAi 0.44 cm2/m2. Moderate (Grade II) aortic regurgitation. Inadequate TR jet to estimate pulmoary artery systolic pressure. Normal right atrial pressure. No significant change compared to previous study on 08/27/2017.  TEE 08/31/2017: - Left ventricle:  There was mild concentric hypertrophy. Systolic function was normal. The estimated ejection fraction was in the range of 55% to 60%. - Aortic valve: Trileaflet; moderately calcified leaflets. There was moderate stenosis. There was trivial regurgitation. Mean gradient (S): 32 mm Hg. Peak gradient (S): 65 mm Hg. Valve area (VTI): 0.78 cm^2. Valve area (Vmax): 1.04 cm^2. Valve area (Vmean): 0.96 cm^2. - Mitral valve: Mildly calcified annulus.  Lexiscan myoview stress test 09/16/2017: 1. Lexiscan stress test was performed. Exercise capacity was not assessed. Stress symptoms included dyspnea, dizziness and headache. Peak blood pressure 168/90 mmHg. Stress EKG is non diagnostic for ischemia as it is a pharmacologic stress. The stress electrocardiogram demonstrated sinus tachycardia, normal resting conduction, left ventricular hypertrophy, no resting arrhythmias and normal rest repolarization. 2. The overall quality of the study is good. Left ventricular cavity is noted to be normal on the rest and stress studies. Gated SPECT images reveal normal myocardial thickening and wall motion. The left ventricular ejection fraction was calculated or visually estimated to be 69%. SPECT images demonstrate predominantly reversible, medium sized, mild intensity perfusion defect. This likely suggests LCx/RPDA territory ischemia.  3. Intermediate risk study.  Carotid artery duplex 08/26/2017: Minimal stenosis in the bilateral internal carotid artery (1-15%). No significant plaque.  Antegrade right vertebral artery flow. Antegrade left vertebral artery flow. F/U studies if clinically indicated. No significant change from 02/01/2015.  Right and left heart catheterization 10/04/2017: Hemodynamic data: Right heart catheterization: RA 5/3, mean 1 mmHg, RV 24/0, EDP 4 mmHg. PA 27/4, mean 50. PA saturation 64%. PW 12/16, mean 7 mmHg. Aortic saturation 98%. CO 4.67, CI 2.25, preserved.  Left heart  catheterization hemodynamic data: LV 164/3, EDP 13 mmHg. Aortic pressure 105/52, mean 74 mmHg. Peak to peak pressure gradient of 27 mmHg, mean gradient 22.5 mm and valve area 0.99 cm.  Angiographic data: Normal coronary arteries, right dominant circulation.   Physical Exam  Constitutional: She is oriented to person, place, and time. Vital signs are normal. She appears well-developed and well-nourished.  HENT:  Head: Normocephalic and atraumatic.  Neck: Normal range of motion.  Cardiovascular: Normal rate, regular rhythm and intact distal pulses.  Murmur heard.  Rumbling crescendo-decrescendo midsystolic murmur is present with  a grade of 3/6 at the upper right sternal border radiating to the neck. S2 diminished intensity Pulses:      Carotid pulses are on the right side with bruit and on the left side with bruit.      Femoral pulses are 1+ on the right side and 1+ on the left side.      Popliteal pulses are 1+ on the right side and 1+ on the left side.       Dorsalis pedis pulses are 2+ on the right side and 2+ on the left side.       Posterior tibial pulses are 0 on the right side and 0 on the left side.  Pulmonary/Chest: Effort normal and breath sounds normal. No accessory muscle usage. No respiratory distress.  Abdominal: Soft. Bowel sounds are normal.  Musculoskeletal: Normal range of motion.  Neurological: She is alert and oriented to person, place, and time.  Skin: Skin is warm, dry and intact.  Vitals reviewed.      Assessment & Recommendations:  1. Moderate to severe aortic stenosis Echocardiogram is unchanged compared to 6 months ago; however, she is now having worsening symptoms that are likely attributed to this. She will likely be a candidate for TAVR. She is unsure that she wants to have procedure at this time due to trying to care for her husband, who is undergoing treatments for breast cancer. I will go ahead and place referral to CV surgery for evaluation. If they  feel that she should go ahead with procedure at this time, she can further discuss with them the procedure and recovery time as this is her main concern with also dealing with her husband.   2. Dyspnea on exertion Symptoms have been worsening over the last few months. As discussed likely attributed to her aortic stenosis and also her weight. Extensive discussion regarding the importance of weight loss especially in view of likely upcoming surgery in the future.  3. Dizziness No syncope. Not noted to be orthostatic on her last office visit. Suspect related to aortic stenosis.   I will see her back in 8 weeks for follow up on aortic stenosis.       Jeri Lager, MSN, APRN, FNP-C [facility], [place] Office: [phone] Fax: [phone]

## 2018-06-22 ENCOUNTER — Encounter: Payer: Self-pay | Admitting: Cardiology

## 2018-06-22 DIAGNOSIS — R42 Dizziness and giddiness: Secondary | ICD-10-CM | POA: Insufficient documentation

## 2018-06-22 DIAGNOSIS — R0609 Other forms of dyspnea: Secondary | ICD-10-CM | POA: Insufficient documentation

## 2018-06-22 DIAGNOSIS — R0600 Dyspnea, unspecified: Secondary | ICD-10-CM | POA: Insufficient documentation

## 2018-07-14 ENCOUNTER — Other Ambulatory Visit: Payer: Self-pay | Admitting: Cardiology

## 2018-08-09 ENCOUNTER — Other Ambulatory Visit: Payer: Self-pay

## 2018-08-09 ENCOUNTER — Encounter: Payer: Self-pay | Admitting: Cardiology

## 2018-08-09 ENCOUNTER — Ambulatory Visit: Payer: Medicare Other | Admitting: Cardiology

## 2018-08-09 VITALS — BP 115/66 | HR 65 | Ht 62.0 in | Wt 238.0 lb

## 2018-08-09 DIAGNOSIS — R0609 Other forms of dyspnea: Secondary | ICD-10-CM

## 2018-08-09 DIAGNOSIS — R0600 Dyspnea, unspecified: Secondary | ICD-10-CM

## 2018-08-09 DIAGNOSIS — R42 Dizziness and giddiness: Secondary | ICD-10-CM | POA: Diagnosis not present

## 2018-08-09 DIAGNOSIS — I350 Nonrheumatic aortic (valve) stenosis: Secondary | ICD-10-CM

## 2018-08-09 NOTE — Progress Notes (Signed)
Subjective:   Kara Lee, female    DOB: 1945-06-30, 73 y.o.   MRN: [id number]  Kara Cliche, MD:  Chief Complaint  Patient presents with  . Aortic Stenosis  . Follow-up    8wk   This visit type was conducted due to national recommendations for restrictions regarding the COVID-19 Pandemic (e.g. social distancing).  This format is felt to be most appropriate for this patient at this time.  All issues noted in this document were discussed and addressed.  No physical exam was performed (except for noted visual exam findings with Telehealth visits).  The patient has consented to conduct a Telehealth visit and understands insurance will be billed.   I discussed the limitations of evaluation and management by telemedicine and the availability of in person appointments. The patient expressed understanding and agreed to proceed.  Virtual Visit via Video Note is as below  I connected with Kara Lee, on 08/09/2018 at 1430 by a video enabled telemedicine application and verified that I am speaking with the correct person using two identifiers.     I have discussed with her regarding the safety during COVID Pandemic and steps and precautions including social distancing with the patient.    HPI: Kara Lee  is a 73 y.o. female  with morbid obesity, diabetes mellitus, hyperlipidemia, unable to tolerate statins except has been tolerating L[place]o, history of SVT with no recurrence. Due to worsening shortness of breath, echocardiogram revealing severe aortic stenosis, she underwent TEE on 09/01/2017 which revealed moderate aortic stenosis. Lexiscan nuclear stress testing on 09/16/2017 was considered intermediate risk study, underwent right and left heart catheterization on 10/04/2017 that revealed normal coronary arteries and no evidence of pulmonary hypertension.   Patient was last seen 8 weeks ago for follow-up for aortic stenosis.  Patient has been having worsening episodes of dizziness  and dyspnea on exertion.  Aortic stenotic murmur was felt to be slightly more prominent.  She underwent repeat echocardiogram on 06/11/2018 that showed continued moderate to severe aortic stenosis with stable peak pressure gradient.  No significant changes compared to previous echocardiogram in May 2019. Referral was placed to CV surgery to discuss possible options of TAVR given her worsening symptoms.  Her husband is currently undergoing treatment for HER-2 positive breast cancer.  She is having difficulty      Past Medical History:  Diagnosis Date  . Aortic stenosis   . Arthritis   . Bicuspid aortic valve   . Bilateral carotid bruits   . Complication of anesthesia    Difficult time waking up; prefers spinal  . Concussion November 09, 2012   From a fall at home on deck  . Depression    postpartum depression after 2nd child  . Diabetes mellitus without complication ([place])   . Dry skin   . Dysrhythmia    Tachycardia  . Family history of anesthesia complication    Makes son extremely hyper  . Fatty liver   . GERD (gastroesophageal reflux disease)   . Headache(784.0)    since having a concussion  . Heart murmur   . OSA (obstructive sleep apnea)   . Osteoarthritis of right knee 07/31/2013  . Pneumonia 2015  . PONV (postoperative nausea and vomiting)   . Rosacea   . Sensitive skin   . Shortness of breath    with exertion  . Sleep apnea    CPAP wears nightly  . Tachycardia     Past Surgical History:  Procedure  Laterality Date  . ABDOMINAL HYSTERECTOMY     still has uterus  . ANKLE RECONSTRUCTION Left   . BIOPSY BREAST Bilateral   . CESAREAN SECTION     x 2  . CHOLECYSTECTOMY    . COLONOSCOPY    . EYE SURGERY     lasik; X3 for crossed eyes; 1 eyelid surgery  . HERNIA REPAIR    . PARTIAL KNEE ARTHROPLASTY Right 07/31/2013   Procedure: RIGHT UNICOMPARTMENTAL KNEE;  Surgeon: Johnny Bridge, MD;  Location: [facility];  Service: Orthopedics;  Laterality: Right;  . RIGHT/LEFT HEART  CATH AND CORONARY ANGIOGRAPHY N/A 10/04/2017   Procedure: RIGHT/LEFT HEART CATH AND CORONARY ANGIOGRAPHY;  Surgeon: Adrian Prows, MD;  Location: [facility];  Service: Cardiovascular;  Laterality: N/A;  . TEE WITHOUT CARDIOVERSION N/A 08/31/2017   Procedure: TRANSESOPHAGEAL ECHOCARDIOGRAM (TEE);  Surgeon: Adrian Prows, MD;  Location[facility];  Service: Cardiovascular;  Laterality: N/A;  . TUBAL LIGATION      Family History  Problem Relation Age of Onset  . Pancreatitis Mother   . COPD Mother   . Cancer - Lung Mother   . Diabetes type II Mother     Social History   Socioeconomic History  . Marital status: Married    Spouse name: Not on file  . Number of children: 2  . Years of education: Not on file  . Highest education level: Not on file  Occupational History  . Not on file  Social Needs  . Financial resource strain: Not on file  . Food insecurity:    Worry: Not on file    Inability: Not on file  . Transportation needs:    Medical: Not on file    Non-medical: Not on file  Tobacco Use  . Smoking status: Never Smoker  . Smokeless tobacco: Never Used  Substance and Sexual Activity  . Alcohol use: Not Currently  . Drug use: No  . Sexual activity: Not on file  Lifestyle  . Physical activity:    Days per week: Not on file    Minutes per session: Not on file  . Stress: Not on file  Relationships  . Social connections:    Talks on phone: Not on file    Gets together: Not on file    Attends religious service: Not on file    Active member of club or organization: Not on file    Attends meetings of clubs or organizations: Not on file    Relationship status: Not on file  . Intimate partner violence:    Fear of current or ex partner: Not on file    Emotionally abused: Not on file    Physically abused: Not on file    Forced sexual activity: Not on file  Other Topics Concern  . Not on file  Social History Narrative  . Not on file    Current Meds  Medication Sig   . acetaminophen (TYLENOL) 650 MG CR tablet Take 1,300 mg by mouth every 8 (eight) hours as needed for pain.  Marland Kitchen amoxicillin (AMOXIL) 500 MG capsule Take 500 mg by mouth as needed. As needed for dental work  . aspirin EC 81 MG tablet Take 81 mg by mouth daily.  Marland Kitchen atenolol (TENORMIN) 25 MG tablet Take 25 mg by mouth 2 (two) times daily.   . clonazePAM (KLONOPIN) 0.5 MG tablet Take 0.5 mg by mouth 2 (two) times daily.  Marland Kitchen esomeprazole (NEXIUM) 40 MG capsule Take 40 mg by mouth  daily.   . estradiol (ESTRACE) 0.1 MG/GM vaginal cream Place 1 Applicatorful vaginally as needed.   Marland Kitchen FLUoxetine (PROZAC) 40 MG capsule Take 40 mg by mouth daily.  . hydrocortisone (ANUSOL-HC) 2.5 % rectal cream Place 1 application rectally as needed.   Marland Kitchen LIVALO 2 MG TABS TAKE 1 TABLET BY MOUTH DAILY  . metFORMIN (GLUCOPHAGE-XR) 500 MG 24 hr tablet Take 1 tablet (500 mg total) by mouth 2 (two) times daily.  Marland Kitchen Propylene Glycol (SYSTANE BALANCE) 0.6 % SOLN Place 1 drop into both eyes daily as needed (dry eyes).     Review of Systems  Constitution: Positive for malaise/fatigue. Negative for decreased appetite, weight gain and weight loss.  Eyes: Negative for visual disturbance.  Cardiovascular: Positive for dyspnea on exertion. Negative for chest pain, claudication, leg swelling, orthopnea, palpitations and syncope.  Respiratory: Negative for hemoptysis, sleep disturbances due to breathing and wheezing.   Endocrine: Negative for cold intolerance and heat intolerance.  Hematologic/Lymphatic: Negative for bleeding problem. Does not bruise/bleed easily.  Skin: Negative for nail changes.  Musculoskeletal: Negative for muscle weakness and myalgias.  Gastrointestinal: Negative for abdominal pain, change in bowel habit, nausea and vomiting.  Neurological: Positive for dizziness. Negative for difficulty with concentration, focal weakness and headaches.  Psychiatric/Behavioral: Negative for altered mental status and suicidal ideas.   All other systems reviewed and are negative.      Objective:     Blood pressure 115/66, pulse 65, height '5\' 2"'$  (1.575 m), weight 238 lb (108 kg).  Cardiac studies:  Echocardiogram 06/06/2018: Left ventricle cavity is normal in size. Moderate concentric hypertrophy of the left ventricle. Mild decrease in global wall motion. Indeterminate diastolic filling pattern. Calculated EF 51%. Left atrial cavity is moderately dilated. Moderate aortic valve leaflet calcification. Moderate to severe aortic valve stenosis. Aortic valve mean gradient of 36 mmHg, Vmax of 3.9 m/s. Calculated aortic valve area by continuity equation is 1.0 cm, with AVAi 0.44 cm2/m2. Moderate (Grade II) aortic regurgitation. Inadequate TR jet to estimate pulmoary artery systolic pressure. Normal right atrial pressure. No significant change compared to previous study on 08/27/2017.  TEE 08/31/2017: - Left ventricle: There was mild concentric hypertrophy. Systolic function was normal. The estimated ejection fraction was in the range of 55% to 60%. - Aortic valve: Trileaflet; moderately calcified leaflets. There was moderate stenosis. There was trivial regurgitation. Mean gradient (S): 32 mm Hg. Peak gradient (S): 65 mm Hg. Valve area (VTI): 0.78 cm^2. Valve area (Vmax): 1.04 cm^2. Valve area (Vmean): 0.96 cm^2. - Mitral valve: Mildly calcified annulus.  Lexiscan myoview stress test 09/16/2017: 1. Lexiscan stress test was performed. Exercise capacity was not assessed. Stress symptoms included dyspnea, dizziness and headache. Peak blood pressure 168/90 mmHg. Stress EKG is non diagnostic for ischemia as it is a pharmacologic stress. The stress electrocardiogram demonstrated sinus tachycardia, normal resting conduction, left ventricular hypertrophy, no resting arrhythmias and normal rest repolarization. 2. The overall quality of the study is good. Left ventricular cavity is noted to be normal on the rest and stress  studies. Gated SPECT images reveal normal myocardial thickening and wall motion. The left ventricular ejection fraction was calculated or visually estimated to be 69%. SPECT images demonstrate predominantly reversible, medium sized, mild intensity perfusion defect. This likely suggests LCx/RPDA territory ischemia.  3. Intermediate risk study.  Carotid artery duplex 08/26/2017: Minimal stenosis in the bilateral internal carotid artery (1-15%). No significant plaque.  Antegrade right vertebral artery flow. Antegrade left vertebral artery flow. F/U studies if clinically indicated.  No significant change from 02/01/2015.  Right and left heart catheterization 10/04/2017: Hemodynamic data: Right heart catheterization: RA 5/3, mean 1 mmHg, RV 24/0, EDP 4 mmHg. PA 27/4, mean 50. PA saturation 64%. PW 12/16, mean 7 mmHg. Aortic saturation 98%. CO 4.67, CI 2.25, preserved.  Left heart catheterization hemodynamic data: LV 164/3, EDP 13 mmHg. Aortic pressure 105/52, mean 74 mmHg. Peak to peak pressure gradient of 27 mmHg, mean gradient 22.5 mm and valve area 0.99 cm.  Angiographic data: Normal coronary arteries, right dominant circulation.   *Physical exam not performed as this is a telephone visit*       Assessment & Recommendations:  1. Moderate to severe aortic stenosis Patient continues to have symptoms of dyspnea on exertion, dizziness, and fatigue. States that she does not feel like herself. Likely multifactoral from obesity, stress, and also her aortic valve. Unsure what happened with her previous referral order, but will place again today. Would like opinion from CV surgery if they feel TAVR is an option for her and if should be performed at this time in view of her symptoms. She has multiple questions regarding the procedure and recovery period that I will defer to CV surgery. She is concerned about being able to take care of her husband.   2. Dyspnea on exertion Continues to have  symptoms that have worsened over the last few months. Stable since last seen by me. No leg edema. As discussed likely attributed to her aortic stenosis and also her weight. Extensive discussion regarding the importance of weight loss especially in view of likely upcoming surgery in the future.  3. Dizziness No syncope. Not noted to be orthostatic on her last office visit. Suspect related to aortic stenosis.   Plan: I will plan to see her back in the office in 3 months for follow up or sooner if needed.     Jeri Lager, MSN, APRN, FNP-C [facility] [place] Office: [phone] Fax: [phone]

## 2018-08-11 ENCOUNTER — Encounter: Payer: Self-pay | Admitting: Cardiology

## 2018-10-08 ENCOUNTER — Other Ambulatory Visit: Payer: Self-pay | Admitting: Cardiology

## 2018-11-09 ENCOUNTER — Ambulatory Visit (INDEPENDENT_AMBULATORY_CARE_PROVIDER_SITE_OTHER): Payer: Medicare Other | Admitting: Cardiology

## 2018-11-09 ENCOUNTER — Encounter: Payer: Self-pay | Admitting: Cardiology

## 2018-11-09 ENCOUNTER — Other Ambulatory Visit: Payer: Self-pay

## 2018-11-09 VITALS — BP 123/71 | HR 74 | Ht 62.0 in | Wt 238.0 lb

## 2018-11-09 DIAGNOSIS — R002 Palpitations: Secondary | ICD-10-CM | POA: Diagnosis not present

## 2018-11-09 DIAGNOSIS — I350 Nonrheumatic aortic (valve) stenosis: Secondary | ICD-10-CM | POA: Diagnosis not present

## 2018-11-09 DIAGNOSIS — R0609 Other forms of dyspnea: Secondary | ICD-10-CM | POA: Diagnosis not present

## 2018-11-09 DIAGNOSIS — R0600 Dyspnea, unspecified: Secondary | ICD-10-CM

## 2018-11-09 NOTE — Progress Notes (Signed)
Primary Physician:  Reita Cliche, MD   Patient ID: Kara Lee, female    DOB: October 26, 1945, 73 y.o.   MRN: [id number]  Subjective:    Chief Complaint  Patient presents with  . Shortness of Breath   This visit type was conducted due to national recommendations for restrictions regarding the COVID-19 Pandemic (e.g. social distancing).  This format is felt to be most appropriate for this patient at this time.  All issues noted in this document were discussed and addressed.  No physical exam was performed (except for noted visual exam findings with Telehealth visits).  The patient has consented to conduct a Telehealth visit and understands insurance will be billed.   I discussed the limitations of evaluation and management by telemedicine and the availability of in person appointments. The patient expressed understanding and agreed to proceed.  Virtual Visit via Video Note is as below  I connected with Kara Lee, on 11/12/18 at 1305 by a video enabled telemedicine application and verified that I am speaking with the correct person using two identifiers.     I have discussed with her regarding the safety during COVID Pandemic and steps and precautions including social distancing with the patient.     HPI: Kara Lee  is a 73 y.o. female  with morbid obesity, diabetes mellitus, hyperlipidemia, unable to tolerate statins except has been tolerating L[place]o, history of SVT with no recurrence. Due to worsening shortness of breath, echocardiogram revealing severe aortic stenosis, she underwent TEE on 09/01/2017 which revealed moderate aortic stenosis. Lexiscan nuclear stress testing on 09/16/2017 was considered intermediate risk study, underwent right and left heart catheterization on 10/04/2017 that revealed normal coronary arteries and no evidence of pulmonary hypertension.   Patient was last seen 3 months ago for follow-up for aortic stenosis.  Patient has been having worsening episodes  of dyspnea on exertion. She is complaining of episodes of palpitations that are almost daily. Aortic stenotic murmur was felt to be slightly more prominent.  She underwent repeat echocardiogram on 06/11/2018 that showed continued moderate to severe aortic stenosis with stable peak pressure gradient.  No significant changes compared to previous echocardiogram in May 2019. Referral was placed to CV surgery to discuss possible options of TAVR given her worsening symptoms; however, she states that she was never contacted for an appointment. She was previously having dizziness that has now resolved. She does continue to notice worsening dyspnea and fatigue. Palpitations resolve with vagal maneuvers. States that she has been told to have "tachycardia".  Her husband has recently completed treatement for HER-2 positive breast cancer and is now in remission.    Past Medical History:  Diagnosis Date  . Aortic stenosis   . Arthritis   . Bicuspid aortic valve   . Bilateral carotid bruits   . Complication of anesthesia    Difficult time waking up; prefers spinal  . Concussion November 09, 2012   From a fall at home on deck  . Depression    postpartum depression after 2nd child  . Diabetes mellitus without complication ([place])   . Dry skin   . Dysrhythmia    Tachycardia  . Family history of anesthesia complication    Makes son extremely hyper  . Fatty liver   . GERD (gastroesophageal reflux disease)   . Headache(784.0)    since having a concussion  . Heart murmur   . OSA (obstructive sleep apnea)   . Osteoarthritis of right knee 07/31/2013  . Pneumonia 2015  .  PONV (postoperative nausea and vomiting)   . Rosacea   . Sensitive skin   . Shortness of breath    with exertion  . Sleep apnea    CPAP wears nightly  . Tachycardia     Past Surgical History:  Procedure Laterality Date  . ABDOMINAL HYSTERECTOMY     still has uterus  . ANKLE RECONSTRUCTION Left   . BIOPSY BREAST Bilateral   . CESAREAN  SECTION     x 2  . CHOLECYSTECTOMY    . COLONOSCOPY    . EYE SURGERY     lasik; X3 for crossed eyes; 1 eyelid surgery  . HERNIA REPAIR    . PARTIAL KNEE ARTHROPLASTY Right 07/31/2013   Procedure: RIGHT UNICOMPARTMENTAL KNEE;  Surgeon: Johnny Bridge, MD;  Location: [facility];  Service: Orthopedics;  Laterality: Right;  . RIGHT/LEFT HEART CATH AND CORONARY ANGIOGRAPHY N/A 10/04/2017   Procedure: RIGHT/LEFT HEART CATH AND CORONARY ANGIOGRAPHY;  Surgeon: Adrian Prows, MD;  Location: [facility];  Service: Cardiovascular;  Laterality: N/A;  . TEE WITHOUT CARDIOVERSION N/A 08/31/2017   Procedure: TRANSESOPHAGEAL ECHOCARDIOGRAM (TEE);  Surgeon: Adrian Prows, MD;  Location: [facility];  Service: Cardiovascular;  Laterality: N/A;  . TUBAL LIGATION      Social History   Socioeconomic History  . Marital status: Married    Spouse name: Not on file  . Number of children: 2  . Years of education: Not on file  . Highest education level: Not on file  Occupational History  . Not on file  Social Needs  . Financial resource strain: Not on file  . Food insecurity    Worry: Not on file    Inability: Not on file  . Transportation needs    Medical: Not on file    Non-medical: Not on file  Tobacco Use  . Smoking status: Never Smoker  . Smokeless tobacco: Never Used  Substance and Sexual Activity  . Alcohol use: Not Currently  . Drug use: No  . Sexual activity: Not on file  Lifestyle  . Physical activity    Days per week: Not on file    Minutes per session: Not on file  . Stress: Not on file  Relationships  . Social Herbalist on phone: Not on file    Gets together: Not on file    Attends religious service: Not on file    Active member of club or organization: Not on file    Attends meetings of clubs or organizations: Not on file    Relationship status: Not on file  . Intimate partner violence    Fear of current or ex partner: Not on file    Emotionally abused: Not on file     Physically abused: Not on file    Forced sexual activity: Not on file  Other Topics Concern  . Not on file  Social History Narrative  . Not on file    Review of Systems  Constitution: Positive for malaise/fatigue. Negative for decreased appetite, weight gain and weight loss.  Eyes: Negative for visual disturbance.  Cardiovascular: Positive for dyspnea on exertion and palpitations. Negative for chest pain, claudication, leg swelling, orthopnea and syncope.  Respiratory: Negative for hemoptysis, sleep disturbances due to breathing and wheezing.   Endocrine: Negative for cold intolerance and heat intolerance.  Hematologic/Lymphatic: Negative for bleeding problem. Does not bruise/bleed easily.  Skin: Negative for nail changes.  Musculoskeletal: Negative for muscle weakness and myalgias.  Gastrointestinal: Negative  for abdominal pain, change in bowel habit, nausea and vomiting.  Neurological: Negative for difficulty with concentration, dizziness, focal weakness and headaches.  Psychiatric/Behavioral: Negative for altered mental status and suicidal ideas.  All other systems reviewed and are negative.     Objective:  Blood pressure 123/71, pulse 74, height '5\' 2"'$  (1.575 m), weight 238 lb (108 kg). Body mass index is 43.53 kg/m.    Physical exam not performed or limited due to virtual visit.  Patient appeared to be in no distress, Neck was supple, respiration was not labored.  Please see exam details from prior visit is as below.   Physical Exam  Constitutional: She is oriented to person, place, and time. Vital signs are normal. She appears well-developed and well-nourished.  HENT:  Head: Normocephalic and atraumatic.  Neck: Normal range of motion.  Cardiovascular: Normal rate, regular rhythm and intact distal pulses.  Murmur heard.  Rumbling crescendo-decrescendo midsystolic murmur is present with a grade of 3/6 at the upper right sternal border radiating to the neck. S2 diminished  intensity Pulses:      Carotid pulses are on the right side with bruit and on the left side with bruit.      Femoral pulses are 1+ on the right side and 1+ on the left side.      Popliteal pulses are 1+ on the right side and 1+ on the left side.       Dorsalis pedis pulses are 2+ on the right side and 2+ on the left side.       Posterior tibial pulses are 0 on the right side and 0 on the left side.  Pulmonary/Chest: Effort normal and breath sounds normal. No accessory muscle usage. No respiratory distress.  Abdominal: Soft. Bowel sounds are normal.  Musculoskeletal: Normal range of motion.  Neurological: She is alert and oriented to person, place, and time.  Skin: Skin is warm, dry and intact.  Vitals reviewed.  Radiology: No results found.  Laboratory examination:    CMP Latest Ref Rng & Units 07/09/2014 08/01/2013 07/20/2013  Glucose 70 - 99 mg/dL 143(H) 112(H) 147(H)  BUN 6 - 23 mg/dL '11 10 9  '$ Creatinine 0.50 - 1.10 mg/dL 0.84 0.74 0.56  Sodium 135 - 145 mmol/L 138 137 137  Potassium 3.5 - 5.1 mmol/L 4.0 4.0 4.4  Chloride 96 - 112 mmol/L 104 101 101  CO2 19 - 32 mmol/L '24 23 20  '$ Calcium 8.4 - 10.5 mg/dL 9.6 8.3(L) 9.3  Total Protein 6.0 - 8.3 g/dL 8.0 - -  Total Bilirubin 0.3 - 1.2 mg/dL 0.4 - -  Alkaline Phos 39 - 117 U/L 72 - -  AST 0 - 37 U/L 34 - -  ALT 0 - 35 U/L 36(H) - -   CBC Latest Ref Rng & Units 10/04/2017 07/09/2014 08/01/2013  WBC 4.0 - 10.5 K/uL 9.3 9.1 11.6(H)  Hemoglobin 12.0 - 15.0 g/dL 12.8 14.1 11.3(L)  Hematocrit 36.0 - 46.0 % 40.4 43.7 35.3(L)  Platelets 150 - 400 K/uL 217 243 190   Lipid Panel  No results found for: CHOL, TRIG, HDL, CHOLHDL, VLDL, LDLCALC, LDLDIRECT HEMOGLOBIN A1C No results found for: HGBA1C, MPG TSH No results for input(s): TSH in the last 8760 hours.  PRN Meds:. There are no discontinued medications. Current Meds  Medication Sig  . acetaminophen (TYLENOL) 650 MG CR tablet Take 1,300 mg by mouth every 8 (eight) hours as needed  for pain.  Marland Kitchen amoxicillin (AMOXIL) 500 MG capsule Take 500  mg by mouth as needed. As needed for dental work  . aspirin EC 81 MG tablet Take 81 mg by mouth daily.  Marland Kitchen atenolol (TENORMIN) 25 MG tablet TAKE 1 TABLET BY MOUTH TWICE DAILY  . clonazePAM (KLONOPIN) 0.5 MG tablet Take 0.5 mg by mouth 2 (two) times daily.  Marland Kitchen esomeprazole (NEXIUM) 40 MG capsule Take 40 mg by mouth daily.   Marland Kitchen estradiol (ESTRACE) 0.1 MG/GM vaginal cream Place 1 Applicatorful vaginally as needed.   Marland Kitchen FLUoxetine (PROZAC) 40 MG capsule Take 40 mg by mouth daily.  . fluticasone (FLONASE) 50 MCG/ACT nasal spray SHAKE LIQUID AND USE 1 SPRAY IN EACH NOSTRIL DAILY  . glimepiride (AMARYL) 1 MG tablet Take 0.5 mg by mouth daily.  Marland Kitchen LIVALO 2 MG TABS TAKE 1 TABLET BY MOUTH DAILY  . metFORMIN (GLUCOPHAGE-XR) 500 MG 24 hr tablet Take 1 tablet (500 mg total) by mouth 2 (two) times daily.  Marland Kitchen Propylene Glycol (SYSTANE BALANCE) 0.6 % SOLN Place 1 drop into both eyes daily as needed (dry eyes).    Cardiac Studies:   Echocardiogram 06/06/2018: Left ventricle cavity is normal in size. Moderate concentric hypertrophy of the left ventricle. Mild decrease in global wall motion. Indeterminate diastolic filling pattern. Calculated EF 51%. Left atrial cavity is moderately dilated. Moderate aortic valve leaflet calcification. Moderate to severe aortic valve stenosis. Aortic valve mean gradient of 36 mmHg, Vmax of 3.9 m/s. Calculated aortic valve area by continuity equation is 1.0 cm, with AVAi 0.44 cm2/m2. Moderate (Grade II) aortic regurgitation. Inadequate TR jet to estimate pulmoary artery systolic pressure. Normal right atrial pressure. No significant change compared to previous study on 08/27/2017.  TEE 08/31/2017: - Left ventricle: There was mild concentric hypertrophy. Systolic function was normal. The estimated ejection fraction was in the range of 55% to 60%. - Aortic valve: Trileaflet; moderately calcified leaflets. There was  moderate stenosis. There was trivial regurgitation. Mean gradient (S): 32 mm Hg. Peak gradient (S): 65 mm Hg. Valve area (VTI): 0.78 cm^2. Valve area (Vmax): 1.04 cm^2. Valve area (Vmean): 0.96 cm^2. - Mitral valve: Mildly calcified annulus.  Lexiscan myoview stress test 09/16/2017: 1. Lexiscan stress test was performed. Exercise capacity was not assessed. Stress symptoms included dyspnea, dizziness and headache. Peak blood pressure 168/90 mmHg. Stress EKG is non diagnostic for ischemia as it is a pharmacologic stress. The stress electrocardiogram demonstrated sinus tachycardia, normal resting conduction, left ventricular hypertrophy, no resting arrhythmias and normal rest repolarization. 2. The overall quality of the study is good. Left ventricular cavity is noted to be normal on the rest and stress studies. Gated SPECT images reveal normal myocardial thickening and wall motion. The left ventricular ejection fraction was calculated or visually estimated to be 69%. SPECT images demonstrate predominantly reversible, medium sized, mild intensity perfusion defect. This likely suggests LCx/RPDA territory ischemia.  3. Intermediate risk study.  Carotid artery duplex 08/26/2017: Minimal stenosis in the bilateral internal carotid artery (1-15%). No significant plaque.  Antegrade right vertebral artery flow. Antegrade left vertebral artery flow. F/U studies if clinically indicated. No significant change from 02/01/2015.  Right and left heart catheterization 10/04/2017: Hemodynamic data: Right heart catheterization: RA 5/3, mean 1 mmHg, RV 24/0, EDP 4 mmHg. PA 27/4, mean 50. PA saturation 64%. PW 12/16, mean 7 mmHg. Aortic saturation 98%. CO 4.67, CI 2.25, preserved.  Left heart catheterization hemodynamic data: LV 164/3, EDP 13 mmHg. Aortic pressure 105/52, mean 74 mmHg. Peak to peak pressure gradient of 27 mmHg, mean gradient 22.5 mm and valve  area 0.99 cm.  Angiographic data:  Normal coronary arteries, right dominant circulation.  Assessment:     ICD-10-CM   1. Moderate to severe aortic stenosis  I35.0 PCV ECHOCARDIOGRAM COMPLETE  2. Palpitations  R00.2 PCV ECHOCARDIOGRAM COMPLETE    Holter monitor - 48 hour  3. Dyspnea on exertion  R06.09 PCV ECHOCARDIOGRAM COMPLETE  4. Morbid obesity ([place])  E66.01      Recommendations:   Since last seen by me, she is continued to have worsening shortness of breath on exertion, her main complaint today is fatigue and worsening palpitations.  She has a history of palpitations that she calls tachycardia.  Appears to resolve with vagal maneuvers, suggestive of SVT.  I will place her on 48-hour Holter monitor for further evaluation.    Referral was placed at her last office visit to CV surgery for evaluation; however, she has not heard from their office.  I continue to feel that it may be worthwhile to get an opinion from CV surgery given her worsening symptoms. Last echo was 6 months ago, will repeat this for surveillance. Once echocardiogram is complete, I will reach back out to them for scheduling.  Blood pressures remain well controlled.  No significant leg swelling, PND, or orthopnea.  Weight continues to be an issue and suspect may also be contributing to her symptoms.  Advised her to continue to be working on this with diet modifications particularly calorie restriction.  I will plan tentatively plan to see her back in 3 months for close monitoring, but may need to make adjustments depending upon her evaluation with CV surgery.  Miquel Dunn, MSN, APRN, FNP-C [facility]. [place] Office: [phone] Fax: [phone]

## 2018-11-12 ENCOUNTER — Encounter: Payer: Self-pay | Admitting: Cardiology

## 2018-11-22 ENCOUNTER — Other Ambulatory Visit: Payer: Self-pay

## 2018-11-22 ENCOUNTER — Ambulatory Visit: Payer: Medicare Other

## 2018-11-22 ENCOUNTER — Telehealth: Payer: Self-pay

## 2018-11-22 NOTE — Telephone Encounter (Signed)
Yes

## 2018-11-22 NOTE — Telephone Encounter (Signed)
Pt called wanting to know if it is ok from a heart standpoint for her to have a thoracic injection for pain.//ah

## 2018-11-23 ENCOUNTER — Other Ambulatory Visit: Payer: Self-pay | Admitting: Physical Medicine and Rehabilitation

## 2018-11-23 DIAGNOSIS — M542 Cervicalgia: Secondary | ICD-10-CM

## 2018-11-23 DIAGNOSIS — M549 Dorsalgia, unspecified: Secondary | ICD-10-CM

## 2018-11-23 NOTE — Telephone Encounter (Signed)
LMOM advising.//ah

## 2018-11-24 ENCOUNTER — Ambulatory Visit: Payer: Medicare Other

## 2018-11-24 ENCOUNTER — Ambulatory Visit (INDEPENDENT_AMBULATORY_CARE_PROVIDER_SITE_OTHER): Payer: Medicare Other

## 2018-11-24 ENCOUNTER — Other Ambulatory Visit: Payer: Self-pay

## 2018-11-24 DIAGNOSIS — R0609 Other forms of dyspnea: Secondary | ICD-10-CM | POA: Diagnosis not present

## 2018-11-24 DIAGNOSIS — R002 Palpitations: Secondary | ICD-10-CM | POA: Diagnosis not present

## 2018-11-24 DIAGNOSIS — I350 Nonrheumatic aortic (valve) stenosis: Secondary | ICD-10-CM | POA: Diagnosis not present

## 2018-11-24 DIAGNOSIS — R0600 Dyspnea, unspecified: Secondary | ICD-10-CM

## 2018-11-26 DIAGNOSIS — R002 Palpitations: Secondary | ICD-10-CM | POA: Diagnosis not present

## 2018-11-27 NOTE — Progress Notes (Signed)
Let patient know that her aortic valve has progressed to severe by echo, but fortunately her LV has remained stable. In view of her symptoms, will proceed with getting an opinion from CV surgery. I will place referral again for this

## 2018-11-27 NOTE — Progress Notes (Signed)
Awaiting a call back

## 2018-11-28 ENCOUNTER — Ambulatory Visit
Admission: RE | Admit: 2018-11-28 | Discharge: 2018-11-28 | Disposition: A | Discharge status: Home / Self Care | Payer: Medicare Other | Source: Ambulatory Visit | Attending: Physical Medicine and Rehabilitation | Admitting: Physical Medicine and Rehabilitation

## 2018-11-28 ENCOUNTER — Other Ambulatory Visit: Payer: Self-pay

## 2018-11-28 DIAGNOSIS — M549 Dorsalgia, unspecified: Secondary | ICD-10-CM

## 2018-11-28 DIAGNOSIS — M542 Cervicalgia: Secondary | ICD-10-CM

## 2018-11-28 MED ORDER — TRIAMCINOLONE ACETONIDE 40 MG/ML IJ SUSP (RADIOLOGY)
60.0000 mg | Freq: Once | INTRAMUSCULAR | Status: AC
  Administered 2018-11-28: 60 mg via EPIDURAL

## 2018-11-28 MED ORDER — IOPAMIDOL (ISOVUE-M 300) INJECTION 61%
1.0000 mL | Freq: Once | INTRAMUSCULAR | Status: AC | PRN
  Administered 2018-11-28: 1 mL via EPIDURAL

## 2018-11-28 NOTE — Discharge Instructions (Signed)

## 2018-12-15 ENCOUNTER — Telehealth: Payer: Self-pay

## 2018-12-15 NOTE — Telephone Encounter (Signed)
S/w pt advised her of above and that you would call her,.

## 2018-12-15 NOTE — Telephone Encounter (Signed)
Pt called and asked about monitor and echo results, please advise

## 2018-12-20 ENCOUNTER — Telehealth: Payer: Self-pay | Admitting: Cardiology

## 2018-12-20 NOTE — Telephone Encounter (Signed)
Discussed monitor results with the patient and she was reassured. Palpitations have improved. She has not heard from CV surgery and would like to discuss her potential options for surgery in the future. Will follow up on this.

## 2019-01-01 ENCOUNTER — Other Ambulatory Visit: Payer: Self-pay

## 2019-01-01 ENCOUNTER — Encounter: Payer: Self-pay | Admitting: Cardiovascular Disease

## 2019-01-01 ENCOUNTER — Ambulatory Visit (INDEPENDENT_AMBULATORY_CARE_PROVIDER_SITE_OTHER): Payer: Medicare Other | Admitting: Cardiovascular Disease

## 2019-01-01 VITALS — BP 104/62 | HR 70 | Ht 62.0 in | Wt 240.1 lb

## 2019-01-01 DIAGNOSIS — I350 Nonrheumatic aortic (valve) stenosis: Secondary | ICD-10-CM

## 2019-01-01 LAB — BASIC METABOLIC PANEL
BUN/Creatinine Ratio: 19 (ref 12–28)
BUN: 13 mg/dL (ref 8–27)
CO2: 18 mmol/L — ABNORMAL LOW (ref 20–29)
Calcium: 9.5 mg/dL (ref 8.7–10.3)
Chloride: 104 mmol/L (ref 96–106)
Creatinine, Ser: 0.68 mg/dL (ref 0.57–1.00)
GFR calc Af Amer: 102 mL/min/{1.73_m2} (ref >59)
GFR calc non Af Amer: 89 mL/min/{1.73_m2} (ref >59)
Glucose: 149 mg/dL — ABNORMAL HIGH (ref 65–99)
Potassium: 4.6 mmol/L (ref 3.5–5.2)
Sodium: 138 mmol/L (ref 134–144)

## 2019-01-01 NOTE — Progress Notes (Signed)
[facility]   MULTIDISCIPLINARY HEART VALVE TEAM  Date:  01/01/2019   ID:  Kara Lee, DOB November 29, 1945, MRN [id number]  PCP:  Reita Cliche, MD   Chief Complaint  Patient presents with  . Shortness of Breath     HISTORY OF PRESENT ILLNESS: Kara Lee is a 73 y.o. female who presents for evaluation of severe aortic stenosis, referred by Dr Einar Gip.   She has known of a heart murmur for at least 20 years. She has no history of rheumatic fever. She has been followed for aortic stenosis with serial echo studies and clinical exams. One year ago she underwent cardiac catheterization demonstrating normal coronary arteries and moderate aortic stenosis. TEE also suggested moderate aortic stenosis and continued follow-up was recommended. Since that time, she complains of progressive fatigue. She complains of shortness of breath, now occurring with walking up stairs or walking up a grade. No orthopnea or PND. She has occasional chest pressure with 'overexertion.' She wears CPAP for treatment of sleep apnea. She has occasional lightheadedness, but has not had frank syncope.   She has been under a lot of stress as she has been caring for her husband who has been going through chemotherapy.   The patient has had regular dental care and reports no problems. She has a few caps.  Past Medical History:  Diagnosis Date  . Aortic stenosis   . Arthritis   . Bicuspid aortic valve   . Bilateral carotid bruits   . Complication of anesthesia    Difficult time waking up; prefers spinal  . Concussion November 09, 2012   From a fall at home on deck  . Depression    postpartum depression after 2nd child  . Diabetes mellitus without complication ([place])   . Dry skin   . Dysrhythmia    Tachycardia  . Family history of anesthesia complication    Makes son extremely hyper  . Fatty liver   . GERD (gastroesophageal reflux disease)   . Headache(784.0)    since having a concussion  . Heart  murmur   . OSA (obstructive sleep apnea)   . Osteoarthritis of right knee 07/31/2013  . Pneumonia 2015  . PONV (postoperative nausea and vomiting)   . Rosacea   . Sensitive skin   . Shortness of breath    with exertion  . Sleep apnea    CPAP wears nightly  . Tachycardia     Current Outpatient Medications  Medication Sig Dispense Refill  . acetaminophen (TYLENOL) 650 MG CR tablet Take 1,300 mg by mouth every 8 (eight) hours as needed for pain.    Marland Kitchen amoxicillin (AMOXIL) 500 MG capsule Take 500 mg by mouth as needed. As needed for dental work    . aspirin EC 81 MG tablet Take 81 mg by mouth daily.    Marland Kitchen atenolol (TENORMIN) 25 MG tablet TAKE 1 TABLET BY MOUTH TWICE DAILY 180 tablet 2  . clonazePAM (KLONOPIN) 0.5 MG tablet Take 0.5 mg by mouth 2 (two) times daily.    Marland Kitchen esomeprazole (NEXIUM) 40 MG capsule Take 40 mg by mouth daily.     Marland Kitchen estradiol (ESTRACE) 0.1 MG/GM vaginal cream Place 1 Applicatorful vaginally as needed.     Marland Kitchen FLUoxetine (PROZAC) 40 MG capsule Take 40 mg by mouth daily.    . fluticasone (FLONASE) 50 MCG/ACT nasal spray SHAKE LIQUID AND USE 1 SPRAY IN EACH NOSTRIL DAILY    . glimepiride (AMARYL) 1 MG tablet Take 0.5  mg by mouth daily.    . hydrocortisone (ANUSOL-HC) 2.5 % rectal cream Place 1 application rectally as needed.     Marland Kitchen LIVALO 2 MG TABS TAKE 1 TABLET BY MOUTH DAILY 90 tablet 3  . metFORMIN (GLUCOPHAGE-XR) 500 MG 24 hr tablet Take 1 tablet (500 mg total) by mouth 2 (two) times daily.    Marland Kitchen Propylene Glycol (SYSTANE BALANCE) 0.6 % SOLN Place 1 drop into both eyes daily as needed (dry eyes).     No current facility-administered medications for this visit.     ALLERGIES:   Bee venom, Latex, Lovenox [enoxaparin sodium], Minocycline, Morphine and related, Nickel, Shellfish allergy, Statins, Sulfa antibiotics, Tape, and Glimepiride   SOCIAL HISTORY:  The patient  reports that she has never smoked. She has never used smokeless tobacco. She reports previous alcohol use.  She reports that she does not use drugs.   FAMILY HISTORY:  The patient's family history includes COPD in her mother; Cancer - Lung in her mother; Diabetes type II in her mother; Pancreatitis in her mother.   REVIEW OF SYSTEMS:  Positive for arthritis, especially back pain.   All other systems are reviewed and negative.   PHYSICAL EXAM: VS:  BP 104/62   Pulse 70   Ht 5\' 2"  (1.575 m)   Wt 240 lb 1.9 oz (108.9 kg)   SpO2 96%   BMI 43.92 kg/m  , BMI Body mass index is 43.92 kg/m. GEN: Well nourished, well developed, morbidly obese woman in no acute distress HEENT: normal Neck: No JVD. carotids 2+ with bilateral bruits Cardiac: The heart is RRR with a grade 3/6 harsh late peaking systolic murmur loudest at the RUSB. Trace bilateral pretibial edema. Pedal pulses 2+ = bilaterally  Respiratory:  clear to auscultation bilaterally GI: soft, nontender, nondistended, + BS MS: no deformity or atrophy Skin: warm and dry, no rash Neuro:  Strength and sensation are intact Psych: euthymic mood, full affect  EKG:  EKG from today reviewed and demonstrates NSR 70 bpm, LVH, nonspecific ST change  RECENT LABS: No results found for requested labs within last 8760 hours.  No results found for requested labs within last 8760 hours.   CrCl cannot be calculated (Patient's most recent lab result is older than the maximum 21 days allowed.).   Wt Readings from Last 3 Encounters:  01/01/19 240 lb 1.9 oz (108.9 kg)  11/09/18 238 lb (108 kg)  08/09/18 238 lb (108 kg)     CARDIAC STUDIES:  Echo: Personally reviewed from Dr Irven Shelling echo system. LV function is vigorous. There is severe aortic stenosis with a peak velocity of 4 meters/second and mean gradient 39 mmHg. The aortic valve is calcified and restricted.    Cardiac Cath:  Conclusion    LV end diastolic pressure is normal.   Right and left heart catheterization 10/04/2017: Hemodynamic data: Right heart catheterization: RA 5/3, mean 1 mmHg,  RV 24/0, EDP 4 mmHg.  PA 27/4, mean 50.  PA saturation 64%.  PW 12/16, mean 7 mmHg.  Aortic saturation 98%.  CO 4.67, CI 2.25, preserved.  Left heart catheterization hemodynamic data: LV 164/3, EDP 13 mmHg.  Aortic pressure 105/52, mean 74 mmHg.  Peak to peak pressure gradient of 27 mmHg, mean gradient 22.5 mm and valve area 0.99 cm.  Angiographic data: Normal coronary arteries, right dominant circulation.  Recommendation: Patient's shortness of breath is probably related to morbid obesity, deconditioning associated with moderate aortic stenosis.  Continue present medical therapy with weight loss,  may benefit from cardiopulmonary rehab.  40 mL contrast utilized.     Conclusion    LV end diastolic pressure is normal.   Right and left heart catheterization 10/04/2017: Hemodynamic data: Right heart catheterization: RA 5/3, mean 1 mmHg, RV 24/0, EDP 4 mmHg.  PA 27/4, mean 50.  PA saturation 64%.  PW 12/16, mean 7 mmHg.  Aortic saturation 98%.  CO 4.67, CI 2.25, preserved.  Left heart catheterization hemodynamic data: LV 164/3, EDP 13 mmHg.  Aortic pressure 105/52, mean 74 mmHg.  Peak to peak pressure gradient of 27 mmHg, mean gradient 22.5 mm and valve area 0.99 cm.  Angiographic data: Normal coronary arteries, right dominant circulation.  Recommendation: Patient's shortness of breath is probably related to morbid obesity, deconditioning associated with moderate aortic stenosis.  Continue present medical therapy with weight loss, may benefit from cardiopulmonary rehab.  40 mL contrast utilized.     Coronary Findings  Diagnostic Dominance: Right Left Main  Vessel was injected. Vessel is normal in caliber. Vessel is angiographically normal.  Left Anterior Descending  Vessel was injected. Vessel is normal in caliber. Vessel is angiographically normal.  Left Circumflex  Vessel was injected. Vessel is normal in caliber. Vessel is angiographically normal.  Right Coronary Artery   Vessel was injected. Vessel is normal in caliber. Vessel is angiographically normal.  Intervention  No interventions have been documented. Left Heart  Left Ventricle LV end diastolic pressure is normal.  Coronary Diagrams  Diagnostic Dominance: Right  Coronary Findings  Diagnostic Dominance: Right Left Main  Vessel was injected. Vessel is normal in caliber. Vessel is angiographically normal.  Left Anterior Descending  Vessel was injected. Vessel is normal in caliber. Vessel is angiographically normal.  Left Circumflex  Vessel was injected. Vessel is normal in caliber. Vessel is angiographically normal.  Right Coronary Artery  Vessel was injected. Vessel is normal in caliber. Vessel is angiographically normal.  Intervention  No interventions have been documented. Left Heart  Left Ventricle LV end diastolic pressure is normal.  Coronary Diagrams  Diagnostic Dominance: Right  Intervention  Implants   No implant documentation for this case.  Syngo Images  Show images for CARDIAC CATHETERIZATION  Images on Long Term Storage  Show images for Xiya, Maez to Procedure Log  Procedure Log    Hemo Data   Most Recent Value  Fick Cardiac Output 4.67 L/min  Fick Cardiac Output Index 2.25 (L/min)/BSA  Aortic Mean Gradient 22.5 mmHg  Aortic Peak Gradient 27 mmHg  Aortic Valve Area 0.99  Aortic Value Area Index 0.48 cm2/BSA  RA A Wave 5 mmHg  RA V Wave 3 mmHg  RA Mean 1 mmHg  RV Systolic Pressure 24 mmHg  RV Diastolic Pressure 0 mmHg  RV EDP 4 mmHg  PA Systolic Pressure 27 mmHg  PA Diastolic Pressure 4 mmHg  PA Mean 15 mmHg  PW A Wave 12 mmHg  PW V Wave 16 mmHg  PW Mean 7 mmHg  AO Systolic Pressure 123456 mmHg  AO Diastolic Pressure 52 mmHg  AO Mean 74 mmHg  LV Systolic Pressure 123456 mmHg  LV Diastolic Pressure 3 mmHg  LV EDP 13 mmHg  AOp Systolic Pressure A999333 mmHg  AOp Diastolic Pressure 61 mmHg  AOp Mean Pressure 89 mmHg  LVp Systolic Pressure  0000000 mmHg  LVp Diastolic Pressure 3 mmHg  LVp EDP Pressure 15 mmHg  QP/QS 1  TPVR Index 6.67 HRUI  TSVR Index 32.94 HRUI  PVR SVR Ratio 0.11  TPVR/TSVR Ratio 0.2   STS RISK CALCULATOR: Isolated AVR: isk of Mortality: 1.834% Renal Failure: 1.339% Permanent Stroke: 0.758% Prolonged Ventilation: 6.693% DSW Infection: 0.228% Reoperation: 2.466% Morbidity or Mortality: 10.894% Short Length of Stay: 31.546% Long Length of Stay: 5.872%  ASSESSMENT AND PLAN: 1.  73 yo woman with Severe, Stage D1, aortic stenosis associated with NYHA functional Class II symptoms of chronic diastolic heart failure.   I have reviewed the natural history of aortic stenosis with the patient and their family members who are present today. We have discussed the limitations of medical therapy and the poor prognosis associated with symptomatic aortic stenosis. We have reviewed potential treatment options, including palliative medical therapy, conventional surgical aortic valve replacement, and transcatheter aortic valve replacement. We discussed treatment options in the context of the patient's specific comorbid medical conditions.  We discussed specific risks and expectations of TAVR today. Potential serious risks discussed today include vascular injury, MI, stroke, heart block requiring PPM, valve embolization, cardiac injury, tamponade, arrhythmia, late valve deterioration, endocarditis, emergency cardiac surgery, and death. The patient and her husband understand that these complication occur at very low frequency, with heart block being the most common serious complication of TAVR at an incidence of 6-8%.  I have personally reviewed the patient's cardiac catheterization, TEE, and most recent echo studies as outlined above. Cardiac catheterization demonstrates an absence of obstructive CAD. TEE shows a calcified, restricted, trileaflet aortic valve. These studies were performed one year ago. Her recent echo  confirms progression of aortic stenosis with a peak velocity of 4 meters/second, mean gradient 39 mmHg, and the presence of at least mild AI. She has become increasingly symptomatic with NYHA II sx's of fatigue and shortness of breath with activity. Significant comorbid conditions include the presence of morbid obesity with BMI > 40 and Type II DM. As next steps, I have recommended a gated CTA of the heart and a CTA of the chest, abdomen, and pelvis to assess anatomic suitability for TAVR. Following her CTA studies, she will be referred for cardiac surgical consultation as part of a multidisciplinary heart team approach to her care.   Kara Lee 01/01/2019 11:00 AM     [place] [address]  [phone] (office) [phone] (fax)

## 2019-01-01 NOTE — Patient Instructions (Addendum)
Your cardiac CT will be scheduled at:  [facility] [address] [place], [place] [address] [phone]  Please arrive at the [facility] main entrance of [facility] 30-45 minutes prior to test start time. Proceed to the [facility] Radiology Department (first floor) to check-in and test prep.   On the Night Before the Test: . Be sure to Drink plenty of water. . Do not consume any caffeinated/decaffeinated beverages or chocolate 12 hours prior to your test. . Do not take any antihistamines 12 hours prior to your test.  On the Day of the Test: . Drink plenty of water. Do not drink any water within one hour of the test. . Do not eat any food 4 hours prior to the test. . DO NOT TAKE METFORMIN the morning of your scans. . You may take your other meds as directed. Bring your Atenolol with you to your scans.  . FEMALES- please wear underwire-free bra if available      After the Test: . Drink plenty of water. . After receiving IV contrast, you may experience a mild flushed feeling. This is normal. . On occasion, you may experience a mild rash up to 24 hours after the test. This is not dangerous. If this occurs, you can take Benadryl 25 mg and increase your fluid intake. . If you experience trouble breathing, this can be serious. If it is severe call 911 IMMEDIATELY. If it is mild, please call our office. Marland Kitchen HOLD METFORMIN 48 hours after test.    Please contact the cardiac imaging nurse navigator should you have any questions/concerns Marchia Bond, RN Navigator Cardiac Imaging [facility] [phone] Office  [phone] Cell

## 2019-01-01 NOTE — H&P (View-Only) (Signed)
[facility]   MULTIDISCIPLINARY HEART VALVE TEAM  Date:  01/01/2019   ID:  Kara Lee, DOB [birth year], MRN [id number]  PCP:  Reita Cliche, MD   Chief Complaint  Patient presents with  . Shortness of Breath     HISTORY OF PRESENT ILLNESS: Kara Lee is a 73 y.o. female who presents for evaluation of severe aortic stenosis, referred by Dr Einar Gip.   She has known of a heart murmur for at least 20 years. She has no history of rheumatic fever. She has been followed for aortic stenosis with serial echo studies and clinical exams. One year ago she underwent cardiac catheterization demonstrating normal coronary arteries and moderate aortic stenosis. TEE also suggested moderate aortic stenosis and continued follow-up was recommended. Since that time, she complains of progressive fatigue. She complains of shortness of breath, now occurring with walking up stairs or walking up a grade. No orthopnea or PND. She has occasional chest pressure with 'overexertion.' She wears CPAP for treatment of sleep apnea. She has occasional lightheadedness, but has not had frank syncope.   She has been under a lot of stress as she has been caring for her husband who has been going through chemotherapy.   The patient has had regular dental care and reports no problems. She has a few caps.  Past Medical History:  Diagnosis Date  . Aortic stenosis   . Arthritis   . Bicuspid aortic valve   . Bilateral carotid bruits   . Complication of anesthesia    Difficult time waking up; prefers spinal  . Concussion November 09, 2012   From a fall at home on deck  . Depression    postpartum depression after 2nd child  . Diabetes mellitus without complication ([place])   . Dry skin   . Dysrhythmia    Tachycardia  . Family history of anesthesia complication    Makes son extremely hyper  . Fatty liver   . GERD (gastroesophageal reflux disease)   . Headache(784.0)    since having a concussion  . Heart  murmur   . OSA (obstructive sleep apnea)   . Osteoarthritis of right knee 07/31/2013  . Pneumonia 2015  . PONV (postoperative nausea and vomiting)   . Rosacea   . Sensitive skin   . Shortness of breath    with exertion  . Sleep apnea    CPAP wears nightly  . Tachycardia     Current Outpatient Medications  Medication Sig Dispense Refill  . acetaminophen (TYLENOL) 650 MG CR tablet Take 1,300 mg by mouth every 8 (eight) hours as needed for pain.    Marland Kitchen amoxicillin (AMOXIL) 500 MG capsule Take 500 mg by mouth as needed. As needed for dental work    . aspirin EC 81 MG tablet Take 81 mg by mouth daily.    Marland Kitchen atenolol (TENORMIN) 25 MG tablet TAKE 1 TABLET BY MOUTH TWICE DAILY 180 tablet 2  . clonazePAM (KLONOPIN) 0.5 MG tablet Take 0.5 mg by mouth 2 (two) times daily.    Marland Kitchen esomeprazole (NEXIUM) 40 MG capsule Take 40 mg by mouth daily.     Marland Kitchen estradiol (ESTRACE) 0.1 MG/GM vaginal cream Place 1 Applicatorful vaginally as needed.     Marland Kitchen FLUoxetine (PROZAC) 40 MG capsule Take 40 mg by mouth daily.    . fluticasone (FLONASE) 50 MCG/ACT nasal spray SHAKE LIQUID AND USE 1 SPRAY IN EACH NOSTRIL DAILY    . glimepiride (AMARYL) 1 MG tablet Take 0.5  mg by mouth daily.    . hydrocortisone (ANUSOL-HC) 2.5 % rectal cream Place 1 application rectally as needed.     Marland Kitchen LIVALO 2 MG TABS TAKE 1 TABLET BY MOUTH DAILY 90 tablet 3  . metFORMIN (GLUCOPHAGE-XR) 500 MG 24 hr tablet Take 1 tablet (500 mg total) by mouth 2 (two) times daily.    Marland Kitchen Propylene Glycol (SYSTANE BALANCE) 0.6 % SOLN Place 1 drop into both eyes daily as needed (dry eyes).     No current facility-administered medications for this visit.     ALLERGIES:   Bee venom, Latex, Lovenox [enoxaparin sodium], Minocycline, Morphine and related, Nickel, Shellfish allergy, Statins, Sulfa antibiotics, Tape, and Glimepiride   SOCIAL HISTORY:  The patient  reports that she has never smoked. She has never used smokeless tobacco. She reports previous alcohol use.  She reports that she does not use drugs.   FAMILY HISTORY:  The patient's family history includes COPD in her mother; Cancer - Lung in her mother; Diabetes type II in her mother; Pancreatitis in her mother.   REVIEW OF SYSTEMS:  Positive for arthritis, especially back pain.   All other systems are reviewed and negative.   PHYSICAL EXAM: VS:  BP 104/62   Pulse 70   Ht 5\' 2"  (1.575 m)   Wt 240 lb 1.9 oz (108.9 kg)   SpO2 96%   BMI 43.92 kg/m  , BMI Body mass index is 43.92 kg/m. GEN: Well nourished, well developed, morbidly obese woman in no acute distress HEENT: normal Neck: No JVD. carotids 2+ with bilateral bruits Cardiac: The heart is RRR with a grade 3/6 harsh late peaking systolic murmur loudest at the RUSB. Trace bilateral pretibial edema. Pedal pulses 2+ = bilaterally  Respiratory:  clear to auscultation bilaterally GI: soft, nontender, nondistended, + BS MS: no deformity or atrophy Skin: warm and dry, no rash Neuro:  Strength and sensation are intact Psych: euthymic mood, full affect  EKG:  EKG from today reviewed and demonstrates NSR 70 bpm, LVH, nonspecific ST change  RECENT LABS: No results found for requested labs within last 8760 hours.  No results found for requested labs within last 8760 hours.   CrCl cannot be calculated (Patient's most recent lab result is older than the maximum 21 days allowed.).   Wt Readings from Last 3 Encounters:  01/01/19 240 lb 1.9 oz (108.9 kg)  11/09/18 238 lb (108 kg)  08/09/18 238 lb (108 kg)     CARDIAC STUDIES:  Echo: Personally reviewed from Dr Irven Shelling echo system. LV function is vigorous. There is severe aortic stenosis with a peak velocity of 4 meters/second and mean gradient 39 mmHg. The aortic valve is calcified and restricted.    Cardiac Cath:  Conclusion    LV end diastolic pressure is normal.   Right and left heart catheterization 10/04/2017: Hemodynamic data: Right heart catheterization: RA 5/3, mean 1 mmHg,  RV 24/0, EDP 4 mmHg.  PA 27/4, mean 50.  PA saturation 64%.  PW 12/16, mean 7 mmHg.  Aortic saturation 98%.  CO 4.67, CI 2.25, preserved.  Left heart catheterization hemodynamic data: LV 164/3, EDP 13 mmHg.  Aortic pressure 105/52, mean 74 mmHg.  Peak to peak pressure gradient of 27 mmHg, mean gradient 22.5 mm and valve area 0.99 cm.  Angiographic data: Normal coronary arteries, right dominant circulation.  Recommendation: Patient's shortness of breath is probably related to morbid obesity, deconditioning associated with moderate aortic stenosis.  Continue present medical therapy with weight loss,  may benefit from cardiopulmonary rehab.  40 mL contrast utilized.     Conclusion    LV end diastolic pressure is normal.   Right and left heart catheterization 10/04/2017: Hemodynamic data: Right heart catheterization: RA 5/3, mean 1 mmHg, RV 24/0, EDP 4 mmHg.  PA 27/4, mean 50.  PA saturation 64%.  PW 12/16, mean 7 mmHg.  Aortic saturation 98%.  CO 4.67, CI 2.25, preserved.  Left heart catheterization hemodynamic data: LV 164/3, EDP 13 mmHg.  Aortic pressure 105/52, mean 74 mmHg.  Peak to peak pressure gradient of 27 mmHg, mean gradient 22.5 mm and valve area 0.99 cm.  Angiographic data: Normal coronary arteries, right dominant circulation.  Recommendation: Patient's shortness of breath is probably related to morbid obesity, deconditioning associated with moderate aortic stenosis.  Continue present medical therapy with weight loss, may benefit from cardiopulmonary rehab.  40 mL contrast utilized.     Coronary Findings  Diagnostic Dominance: Right Left Main  Vessel was injected. Vessel is normal in caliber. Vessel is angiographically normal.  Left Anterior Descending  Vessel was injected. Vessel is normal in caliber. Vessel is angiographically normal.  Left Circumflex  Vessel was injected. Vessel is normal in caliber. Vessel is angiographically normal.  Right Coronary Artery   Vessel was injected. Vessel is normal in caliber. Vessel is angiographically normal.  Intervention  No interventions have been documented. Left Heart  Left Ventricle LV end diastolic pressure is normal.  Coronary Diagrams  Diagnostic Dominance: Right  Coronary Findings  Diagnostic Dominance: Right Left Main  Vessel was injected. Vessel is normal in caliber. Vessel is angiographically normal.  Left Anterior Descending  Vessel was injected. Vessel is normal in caliber. Vessel is angiographically normal.  Left Circumflex  Vessel was injected. Vessel is normal in caliber. Vessel is angiographically normal.  Right Coronary Artery  Vessel was injected. Vessel is normal in caliber. Vessel is angiographically normal.  Intervention  No interventions have been documented. Left Heart  Left Ventricle LV end diastolic pressure is normal.  Coronary Diagrams  Diagnostic Dominance: Right  Intervention  Implants   No implant documentation for this case.  Syngo Images  Show images for CARDIAC CATHETERIZATION  Images on Long Term Storage  Show images for Genine, [place] to Procedure Log  Procedure Log    Hemo Data   Most Recent Value  Fick Cardiac Output 4.67 L/min  Fick Cardiac Output Index 2.25 (L/min)/BSA  Aortic Mean Gradient 22.5 mmHg  Aortic Peak Gradient 27 mmHg  Aortic Valve Area 0.99  Aortic Value Area Index 0.48 cm2/BSA  RA A Wave 5 mmHg  RA V Wave 3 mmHg  RA Mean 1 mmHg  RV Systolic Pressure 24 mmHg  RV Diastolic Pressure 0 mmHg  RV EDP 4 mmHg  PA Systolic Pressure 27 mmHg  PA Diastolic Pressure 4 mmHg  PA Mean 15 mmHg  PW A Wave 12 mmHg  PW V Wave 16 mmHg  PW Mean 7 mmHg  AO Systolic Pressure 123456 mmHg  AO Diastolic Pressure 52 mmHg  AO Mean 74 mmHg  LV Systolic Pressure 123456 mmHg  LV Diastolic Pressure 3 mmHg  LV EDP 13 mmHg  AOp Systolic Pressure A999333 mmHg  AOp Diastolic Pressure 61 mmHg  AOp Mean Pressure 89 mmHg  LVp Systolic Pressure  0000000 mmHg  LVp Diastolic Pressure 3 mmHg  LVp EDP Pressure 15 mmHg  QP/QS 1  TPVR Index 6.67 HRUI  TSVR Index 32.94 HRUI  PVR SVR Ratio 0.11  TPVR/TSVR Ratio 0.2   STS RISK CALCULATOR: Isolated AVR: isk of Mortality: 1.834% Renal Failure: 1.339% Permanent Stroke: 0.758% Prolonged Ventilation: 6.693% DSW Infection: 0.228% Reoperation: 2.466% Morbidity or Mortality: 10.894% Short Length of Stay: 31.546% Long Length of Stay: 5.872%  ASSESSMENT AND PLAN: 1.  73 yo woman with Severe, Stage D1, aortic stenosis associated with NYHA functional Class II symptoms of chronic diastolic heart failure.   I have reviewed the natural history of aortic stenosis with the patient and their family members who are present today. We have discussed the limitations of medical therapy and the poor prognosis associated with symptomatic aortic stenosis. We have reviewed potential treatment options, including palliative medical therapy, conventional surgical aortic valve replacement, and transcatheter aortic valve replacement. We discussed treatment options in the context of the patient's specific comorbid medical conditions.  We discussed specific risks and expectations of TAVR today. Potential serious risks discussed today include vascular injury, MI, stroke, heart block requiring PPM, valve embolization, cardiac injury, tamponade, arrhythmia, late valve deterioration, endocarditis, emergency cardiac surgery, and death. The patient and her husband understand that these complication occur at very low frequency, with heart block being the most common serious complication of TAVR at an incidence of 6-8%.  I have personally reviewed the patient's cardiac catheterization, TEE, and most recent echo studies as outlined above. Cardiac catheterization demonstrates an absence of obstructive CAD. TEE shows a calcified, restricted, trileaflet aortic valve. These studies were performed one year ago. Her recent echo  confirms progression of aortic stenosis with a peak velocity of 4 meters/second, mean gradient 39 mmHg, and the presence of at least mild AI. She has become increasingly symptomatic with NYHA II sx's of fatigue and shortness of breath with activity. Significant comorbid conditions include the presence of morbid obesity with BMI > 40 and Type II DM. As next steps, I have recommended a gated CTA of the heart and a CTA of the chest, abdomen, and pelvis to assess anatomic suitability for TAVR. Following her CTA studies, she will be referred for cardiac surgical consultation as part of a multidisciplinary heart team approach to her care.   Deatra James 01/01/2019 11:00 AM     [place] [address]  [phone] (office) [phone] (fax)

## 2019-01-02 ENCOUNTER — Other Ambulatory Visit: Payer: Self-pay

## 2019-01-17 ENCOUNTER — Ambulatory Visit (HOSPITAL_COMMUNITY)
Admission: RE | Admit: 2019-01-17 | Discharge: 2019-01-17 | Disposition: A | Discharge status: Home / Self Care | Payer: Medicare Other | Source: Ambulatory Visit | Attending: Cardiovascular Disease | Admitting: Cardiovascular Disease

## 2019-01-17 ENCOUNTER — Ambulatory Visit: Payer: Medicare Other | Attending: Cardiovascular Disease | Admitting: Physical Therapy

## 2019-01-17 ENCOUNTER — Other Ambulatory Visit: Payer: Self-pay

## 2019-01-17 ENCOUNTER — Encounter: Payer: Self-pay | Admitting: Physical Therapy

## 2019-01-17 ENCOUNTER — Ambulatory Visit (HOSPITAL_BASED_OUTPATIENT_CLINIC_OR_DEPARTMENT_OTHER)
Admission: RE | Admit: 2019-01-17 | Discharge: 2019-01-17 | Disposition: A | Discharge status: Home / Self Care | Payer: Medicare Other | Source: Ambulatory Visit | Attending: Cardiovascular Disease | Admitting: Cardiovascular Disease

## 2019-01-17 DIAGNOSIS — R2689 Other abnormalities of gait and mobility: Secondary | ICD-10-CM | POA: Insufficient documentation

## 2019-01-17 DIAGNOSIS — I350 Nonrheumatic aortic (valve) stenosis: Secondary | ICD-10-CM | POA: Insufficient documentation

## 2019-01-17 MED ORDER — IOHEXOL 350 MG/ML SOLN
100.0000 mL | Freq: Once | INTRAVENOUS | Status: AC | PRN
  Administered 2019-01-17: 11:00:00 100 mL via INTRAVENOUS

## 2019-01-17 NOTE — Progress Notes (Signed)
Carotid duplex       has been completed. Preliminary results can be found under CV proc through chart review. Sury Wentworth, BS, RDMS, RVT   

## 2019-01-17 NOTE — Therapy (Signed)
[place], [place], [address] Phone: [phone]   Fax:  [phone]  Physical Therapy Evaluation  Patient Details  Name: Kara Lee MRN: [id number] Date of Birth: 01-29-1946 Referring Provider (PT): Dr Sherren Mocha    Encounter Date: 01/17/2019  PT End of Session - 01/17/19 1558    Visit Number  1    Number of Visits  1    Date for PT Re-Evaluation  01/17/19    PT Start Time  1232    PT Stop Time  1300    PT Time Calculation (min)  28 min    Activity Tolerance  Patient tolerated treatment well    Behavior During Therapy  [facility] for tasks assessed/performed       Past Medical History:  Diagnosis Date  . Aortic stenosis   . Arthritis   . Bicuspid aortic valve   . Bilateral carotid bruits   . Complication of anesthesia    Difficult time waking up; prefers spinal  . Concussion November 09, 2012   From a fall at home on deck  . Depression    postpartum depression after 2nd child  . Diabetes mellitus without complication ([place])   . Dry skin   . Dysrhythmia    Tachycardia  . Family history of anesthesia complication    Makes son extremely hyper  . Fatty liver   . GERD (gastroesophageal reflux disease)   . Headache(784.0)    since having a concussion  . Heart murmur   . OSA (obstructive sleep apnea)   . Osteoarthritis of right knee 07/31/2013  . Pneumonia 2015  . PONV (postoperative nausea and vomiting)   . Rosacea   . Sensitive skin   . Shortness of breath    with exertion  . Sleep apnea    CPAP wears nightly  . Tachycardia     Past Surgical History:  Procedure Laterality Date  . ABDOMINAL HYSTERECTOMY     still has uterus  . ANKLE RECONSTRUCTION Left   . BIOPSY BREAST Bilateral   . CESAREAN SECTION     x 2  . CHOLECYSTECTOMY    . COLONOSCOPY    . EYE SURGERY     lasik; X3 for crossed eyes; 1 eyelid surgery  . HERNIA REPAIR    . PARTIAL KNEE ARTHROPLASTY Right 07/31/2013   Procedure: RIGHT UNICOMPARTMENTAL KNEE;  Surgeon: Johnny Bridge, MD;  Location: [facility];  Service: Orthopedics;  Laterality: Right;  . RIGHT/LEFT HEART CATH AND CORONARY ANGIOGRAPHY N/A 10/04/2017   Procedure: RIGHT/LEFT HEART CATH AND CORONARY ANGIOGRAPHY;  Surgeon: Adrian Prows, MD;  Location: [facility];  Service: Cardiovascular;  Laterality: N/A;  . TEE WITHOUT CARDIOVERSION N/A 08/31/2017   Procedure: TRANSESOPHAGEAL ECHOCARDIOGRAM (TEE);  Surgeon: Adrian Prows, MD;  Location: [facility];  Service: Cardiovascular;  Laterality: N/A;  . TUBAL LIGATION      There were no vitals filed for this visit.   Subjective Assessment - 01/17/19 1235    Subjective  Patient has had an increase in shortness of breath and fatigue over the last year. Large anounts of steps and hills give her increased dyspnea.    Currently in Pain?  Yes    Pain Score  3     Pain Location  Neck    Pain Orientation  Right    Pain Descriptors / Indicators  Aching    Pain Type  Chronic pain    Pain Onset  More than a  month ago    Pain Frequency  Constant    Aggravating Factors   turning her head    Pain Relieving Factors  rest    Effect of Pain on Daily Activities  difficulty turning her head         [facility] PT Assessment - 01/17/19 0001      Assessment   Medical Diagnosis  Severe Aortic Stenosis     Referring Provider (PT)  Dr Sherren Mocha     Onset Date/Surgical Date  --   About a year ago    Hand Dominance  Right    Prior Therapy  None       Precautions   Precautions  None      Restrictions   Weight Bearing Restrictions  No      Balance Screen   Has the patient fallen in the past 6 months  Yes    How many times?  1    Has the patient had a decrease in activity level because of a fear of falling?   No    Is the patient reluctant to leave their home because of a fear of falling?   No      Home Environment   Additional Comments  14 steps insode her house       Prior Function   Level of  Independence  Independent    Vocation  Retired      Associate Professor   Overall Cognitive Status  Within Functional Limits for tasks assessed    Attention  Focused    Focused Attention  Appears intact    Memory  Appears intact    Awareness  Appears intact    Problem Solving  Appears intact      Sensation   Light Touch  Appears Intact    Additional Comments  tingling in feet and hands at times       Coordination   Gross Motor Movements are Fluid and Coordinated  Yes    Fine Motor Movements are Fluid and Coordinated  Yes      ROM / Strength   AROM / PROM / Strength  AROM;Strength      AROM   Overall AROM Comments  WNL ROM of UE /LE       Strength   Overall Strength Comments  5/5 gross UE and LE     Strength Assessment Site  Hand    Right/Left hand  Right;Left    Right Hand Grip (lbs)  40    Left Hand Grip (lbs)  40       [facility] Pre-Surgical Assessment - 01/17/19 0001    5 Meter Walk Test- trial 1  4 sec    5 Meter Walk Test- trial 2  5 sec.     5 Meter Walk Test- trial 3  5 sec.    5 meter walk test average  4.67 sec    4 Stage Balance Test tolerated for:   10 sec.    4 Stage Balance Test Position  3    comment  could not maintain single leg stance     Sit To Stand Test- trial 1  5 sec.    Comment  15    ADL/IADL Independent with:  Bathing;Dressing;Finances;Meal prep    ADL/IADL Needs Assistance with:  Valla Leaver work    6 Minute Walk- Baseline  yes    BP (mmHg)  108/63    HR (bpm)  71    02 Sat (%RA)  96 %    Modified Borg Scale for Dyspnea  10- Shortness of breath so severe you need to stop    Perceived Rate of Exertion (Borg)  9- very light    6 Minute Walk Post Test  yes    BP (mmHg)  119/63    HR (bpm)  94    02 Sat (%RA)  96 %    Modified Borg Scale for Dyspnea  0- Nothing at all    Perceived Rate of Exertion (Borg)  6-    Aerobic Endurance Distance Walked  1065    Endurance additional comments  31% disability. No seated rest breaks               Objective  measurements completed on examination: See above findings.              PT Education - 01/17/19 1237    Education Details  purpose of testing    Person(s) Educated  Patient    Methods  Explanation;Demonstration;Tactile cues;Verbal cues    Comprehension  Verbalized understanding;Returned demonstration;Verbal cues required;Tactile cues required                  Plan - 01/17/19 1559    Clinical Impression Statement  See below    Stability/Clinical Decision Making  Stable/Uncomplicated    Clinical Decision Making  Low    Rehab Potential  Poor    PT Frequency  One time visit    PT Treatment/Interventions  Patient/family education      Clinical Impression Statement: Pt is a 73 yo female presenting to OP PT for evaluation prior to possible TAVR surgery due to severe aortic stenosis. Pt reports onset of fatigue and dyspnea while ambulating approximately 12 months ago. Symptoms are limiting ability to ambulate distances and hills.. Pt presents with normal ROM and strength, fair balance and is not at high fall risk 4 stage balance test, decreased walking speed and decreased aerobic endurance per 6 minute walk test. Pt ambulated 1065 feet in 6 min . At time of rest, patient's HR was 94bpm and O2 was 96 on room air. Pt reported 1/10 shortness of breath on modified scale for dyspnea. B/P increased significantly with 6 minute walk test. Based on the Short Physical Performance Battery, patient has a frailty rating of 10 /12 with </= 5/12 considered frail.    Patient will benefit from skilled therapeutic intervention in order to improve the following deficits and impairments:     Visit Diagnosis: Other abnormalities of gait and mobility      Problem List Patient Active Problem List   Diagnosis Date Noted  . Dizziness 06/22/2018  . Dyspnea on exertion 06/22/2018  . Bilateral carotid bruits   . OSA (obstructive sleep apnea)   . Abnormal stress test 10/02/2017  . OSA on CPAP  10/02/2017  . Morbid obesity ([place]) 10/02/2017  . Moderate aortic stenosis 08/31/2017  . Osteoarthritis of right knee 07/31/2013  . Knee osteoarthritis 07/31/2013    Kara Lee PT DPT  01/17/2019, 4:05 PM  [facility] [address] [place], [place], [address] Phone: [phone]   Fax:  [phone]  Name: Kara Lee MRN: [id number] Date of Birth: 10-May-1945

## 2019-01-18 HISTORY — PX: CARDIAC VALVE REPLACEMENT: SHX585

## 2019-01-19 ENCOUNTER — Encounter: Payer: Self-pay | Admitting: Physician Assistant

## 2019-01-22 ENCOUNTER — Other Ambulatory Visit: Payer: Self-pay

## 2019-01-22 ENCOUNTER — Encounter: Payer: Self-pay | Admitting: Thoracic Surgery (Cardiothoracic Vascular Surgery)

## 2019-01-22 ENCOUNTER — Institutional Professional Consult (permissible substitution): Payer: Medicare Other | Admitting: Thoracic Surgery (Cardiothoracic Vascular Surgery)

## 2019-01-22 VITALS — BP 124/86 | HR 66 | Temp 97.3°F | Resp 20 | Ht 62.0 in | Wt 243.4 lb

## 2019-01-22 DIAGNOSIS — I350 Nonrheumatic aortic (valve) stenosis: Secondary | ICD-10-CM | POA: Diagnosis not present

## 2019-01-22 NOTE — Patient Instructions (Addendum)
Stop taking metformin 2 days before surgery  Continue taking all other medications without change through the day before surgery.  Have nothing to eat or drink after midnight the night before surgery.  On the morning of surgery take only Nexium with a sip of water.

## 2019-01-22 NOTE — Progress Notes (Signed)
[address]       [place] [address]             [phone]     CARDIOTHORACIC SURGERY CONSULTATION REPORT  Referring Provider is Sherren Mocha, MD Primary Cardiologist is Adrian Prows, MD PCP is Reita Cliche, MD  Chief Complaint  Patient presents with   Aortic Stenosis    New pt consult, review test results    HPI:  Patient is a morbidly obese 73 year old female with history of aortic stenosis, type 2 diabetes mellitus, GE reflux disease, and degenerative arthritis who has been referred for surgical consultation to discuss treatment options for management of severe symptomatic aortic stenosis.  Patient states that she has been told that she had a heart murmur for more than 20 years.  She denies any known history of rheumatic fever.  Several years ago she was referred to Dr. Rosalia Hammers who has been following her ever since.  Previous echocardiograms have documented the presence of moderate aortic stenosis.  She developed progressive symptoms of exertional shortness of breath and fatigue.  In 2019 she underwent transesophageal echocardiogram and diagnostic cardiac catheterization.  TEE confirmed the presence of moderate aortic stenosis with normal left ventricular systolic function.  She does not have any significant coronary artery disease.  Continued medical therapy was recommended.  Over the past year the patient has had further progression of symptoms of exertional shortness of breath and fatigue, as well as occasional dizzy spells without syncope.  She reports occasional tightness across her chest without clear exertional angina.  Follow-up transthoracic echocardiogram was performed demonstrating preserved left ventricular systolic function but significant progression of the severity of aortic stenosis with peak velocity across aortic valve reported 4.0 m/s corresponding to mean transvalvular gradient estimated 38 mmHg.  There was mild to moderate aortic  insufficiency.  The patient was referred to the multidisciplinary heart valve clinic and has been evaluated previously by Dr. Burt Knack.  CT angiography was performed and the patient referred for surgical consultation.  Patient is married and lives locally in [place] with her husband.  She has lived a fairly sedentary lifestyle.  She reports that she has lifestyle limiting exertional shortness of breath and generalized fatigue.  She gets short of breath with moderate level activity but she denies resting shortness of breath, PND, or orthopnea.  She reports occasional tightness across her chest that do not seem to be related to physical exertion.  She reports occasional mild dizzy spells without syncope.  She has not had significant lower extremity edema.  Her mobility is also limited by some problems with balance and generalized arthritis.  Past Medical History:  Diagnosis Date   Arthritis    Bicuspid aortic valve    Bilateral carotid bruits    Concussion November 09, 2012   From a fall at home on deck   Depression    postpartum depression after 2nd child   Diabetes mellitus ([facility])    Diabetes mellitus without complication ([facility])    Fatty liver    GERD (gastroesophageal reflux disease)    HLD (hyperlipidemia)    Morbid obesity ([facility])    OSA on CPAP    Osteoarthritis of right knee 07/31/2013   Rosacea    Severe aortic stenosis     Past Surgical History:  Procedure Laterality Date   ABDOMINAL HYSTERECTOMY     still has uterus   ANKLE RECONSTRUCTION Left    BIOPSY BREAST Bilateral  CESAREAN SECTION     x 2   CHOLECYSTECTOMY     COLONOSCOPY     EYE SURGERY     lasik; X3 for crossed eyes; 1 eyelid surgery   HERNIA REPAIR     PARTIAL KNEE ARTHROPLASTY Right 07/31/2013   Procedure: RIGHT UNICOMPARTMENTAL KNEE;  Surgeon: Johnny Bridge, MD;  Location: [facility];  Service: Orthopedics;  Laterality: Right;   RIGHT/LEFT HEART CATH AND CORONARY ANGIOGRAPHY N/A 10/04/2017    Procedure: RIGHT/LEFT HEART CATH AND CORONARY ANGIOGRAPHY;  Surgeon: Adrian Prows, MD;  Location: [facility];  Service: Cardiovascular;  Laterality: N/A;   TEE WITHOUT CARDIOVERSION N/A 08/31/2017   Procedure: TRANSESOPHAGEAL ECHOCARDIOGRAM (TEE);  Surgeon: Adrian Prows, MD;  Location: [facility];  Service: Cardiovascular;  Laterality: N/A;   TUBAL LIGATION      Family History  Problem Relation Age of Onset   Pancreatitis Mother    COPD Mother    Cancer - Lung Mother    Diabetes type II Mother     Social History   Socioeconomic History   Marital status: Married    Spouse name: Not on file   Number of children: 2   Years of education: Not on file   Highest education level: Not on file  Occupational History   Not on file  Social Needs   Financial resource strain: Not on file   Food insecurity    Worry: Not on file    Inability: Not on file   Transportation needs    Medical: Not on file    Non-medical: Not on file  Tobacco Use   Smoking status: Never Smoker   Smokeless tobacco: Never Used  Substance and Sexual Activity   Alcohol use: Not Currently   Drug use: No   Sexual activity: Not on file  Lifestyle   Physical activity    Days per week: Not on file    Minutes per session: Not on file   Stress: Not on file  Relationships   Social connections    Talks on phone: Not on file    Gets together: Not on file    Attends religious service: Not on file    Active member of club or organization: Not on file    Attends meetings of clubs or organizations: Not on file    Relationship status: Not on file   Intimate partner violence    Fear of current or ex partner: Not on file    Emotionally abused: Not on file    Physically abused: Not on file    Forced sexual activity: Not on file  Other Topics Concern   Not on file  Social History Narrative   Not on file    Current Outpatient Medications  Medication Sig Dispense Refill   acetaminophen  (TYLENOL) 650 MG CR tablet Take 1,300 mg by mouth every 8 (eight) hours as needed for pain.     amoxicillin (AMOXIL) 500 MG capsule Take 500 mg by mouth as needed. As needed for dental work     aspirin EC 81 MG tablet Take 81 mg by mouth daily.     atenolol (TENORMIN) 25 MG tablet TAKE 1 TABLET BY MOUTH TWICE DAILY 180 tablet 2   clonazePAM (KLONOPIN) 0.5 MG tablet Take 0.5 mg by mouth 2 (two) times daily.     esomeprazole (NEXIUM) 40 MG capsule Take 40 mg by mouth daily.      estradiol (ESTRACE) 0.1 MG/GM vaginal cream Place 1 Applicatorful vaginally  as needed.      FLUoxetine (PROZAC) 40 MG capsule Take 40 mg by mouth daily.     fluticasone (FLONASE) 50 MCG/ACT nasal spray SHAKE LIQUID AND USE 1 SPRAY IN EACH NOSTRIL DAILY     hydrocortisone (ANUSOL-HC) 2.5 % rectal cream Place 1 application rectally as needed.      LIVALO 2 MG TABS TAKE 1 TABLET BY MOUTH DAILY 90 tablet 3   metFORMIN (GLUCOPHAGE-XR) 500 MG 24 hr tablet Take 1 tablet (500 mg total) by mouth 2 (two) times daily.     Propylene Glycol (SYSTANE BALANCE) 0.6 % SOLN Place 1 drop into both eyes daily as needed (dry eyes).     No current facility-administered medications for this visit.     Allergies  Allergen Reactions   Bee Venom Itching and Swelling   Latex Itching and Swelling   Lovenox [Enoxaparin Sodium] Other (See Comments)   Minocycline Nausea And Vomiting   Morphine And Related Nausea And Vomiting   Nickel Other (See Comments)    Contact dermatitis    Shellfish Allergy Itching   Statins     Muscle aches   Sulfa Antibiotics Nausea And Vomiting   Tape Itching    Bandaids and adhesive tape   Glimepiride Rash      Review of Systems:   General:  Normal appetite, decreased energy, no weight gain, no weight loss, no fever  Cardiac:  no chest pain with exertion, occasional fleeting chest pain at rest, +SOB with exertion, no resting SOB, no PND, no orthopnea, no palpitations, no arrhythmia, no  atrial fibrillation, no LE edema, + dizzy spells, no syncope  Respiratory:  + exertional shortness of breath, no home oxygen, no productive cough, no dry cough, no bronchitis, no wheezing, no hemoptysis, no asthma, no pain with inspiration or cough, + sleep apnea, + CPAP at night  GI:   no difficulty swallowing, no reflux, no frequent heartburn, no hiatal hernia, no abdominal pain, + constipation, no diarrhea, no hematochezia, no hematemesis, no melena  GU:   no dysuria,  no frequency, no urinary tract infection, no hematuria, no kidney stones, no kidney disease  Vascular:  + pain suggestive of claudication, + pain in feet, no leg cramps, + varicose veins, no DVT, no non-healing foot ulcer  Neuro:   no stroke, no TIA's, no seizures, no headaches, no temporary blindness one eye,  no slurred speech, + peripheral neuropathy, no chronic pain, mild instability of gait, + memory/cognitive dysfunction  Musculoskeletal: + arthritis, + joint swelling, + myalgias, mild difficulty walking, slightly reduced mobility   Skin:   + rash, + itching, no skin infections, no pressure sores or ulcerations  Psych:   no anxiety, no depression, no nervousness, + unusual recent stress  Eyes:   + blurry vision, + floaters, no recent vision changes, + wears glasses or contacts  ENT:   + hearing loss, no loose or painful teeth, no dentures, last saw dentist 1 year ago - scheduled for 01/24/2019  Hematologic:  = easy bruising, no abnormal bleeding, no clotting disorder, no frequent epistaxis  Endocrine:  + diabetes, does check CBG's at home     Physical Exam:   BP 124/86 (BP Location: Right Arm, Patient Position: Sitting)    Pulse 66    Temp (!) 97.3 F (36.3 C) (Skin)    Resp 20    Ht 5\' 2"  (1.575 m)    Wt 243 lb 6.4 oz (110.4 kg)    SpO2 95% Comment:  RA   BMI 44.52 kg/m   General:  Morbidly obese,  well-appearing  HEENT:  Unremarkable   Neck:   no JVD, no bruits, no adenopathy   Chest:   clear to auscultation,  symmetrical breath sounds, no wheezes, no rhonchi   CV:   RRR, grade II/VI systolic murmur   Abdomen:  soft, non-tender, no masses   Extremities:  warm, well-perfused, pulses diminished but palpable, no LE edema  Rectal/GU  Deferred  Neuro:   Grossly non-focal and symmetrical throughout  Skin:   Clean and dry, no rashes, no breakdown   Diagnostic Tests:  Echocardiogram 11/24/2018:  Left ventricle cavity is normal in size. Moderate concentric hypertrophy of the left ventricle. Normal LV systolic function with EF 67%. Normal global wall motion. Diastolic function could not be assessed due to mitral annular calcification.  Left atrial cavity is mildly dilated. Probably trileaflet aortic valve with severe calcification. Severe aortic valve stenosis. Aortic valve mean gradient of 38 mmHg, Vmax of 4 m/s. Mild to moderate aortic regurgitation.  Moderate calcification of the mitral valve annulus. Mild (Grade I) mitral regurgitation. Mildly restricted mitral valve leaflets. Mild tricuspid regurgitation.  No evidence of pulmonary hypertension. Compared to previous study on 06/06/2018, there is interval increase in severity of aortic stenosis.    RIGHT/LEFT HEART CATH AND CORONARY ANGIOGRAPHY  Conclusion    LV end diastolic pressure is normal.   Right and left heart catheterization 10/04/2017: Hemodynamic data: Right heart catheterization: RA 5/3, mean 1 mmHg, RV 24/0, EDP 4 mmHg.  PA 27/4, mean 50.  PA saturation 64%.  PW 12/16, mean 7 mmHg.  Aortic saturation 98%.  CO 4.67, CI 2.25, preserved.  Left heart catheterization hemodynamic data: LV 164/3, EDP 13 mmHg.  Aortic pressure 105/52, mean 74 mmHg.  Peak to peak pressure gradient of 27 mmHg, mean gradient 22.5 mm and valve area 0.99 cm.  Angiographic data: Normal coronary arteries, right dominant circulation.  Recommendation: Patient's shortness of breath is probably related to morbid obesity, deconditioning associated with moderate  aortic stenosis.  Continue present medical therapy with weight loss, may benefit from cardiopulmonary rehab.  40 mL contrast utilized.   Indications  Dyspnea on exertion [R06.09 (ICD-10-CM)]  Nonrheumatic aortic valve stenosis [I35.0 (ICD-10-CM)]  Procedural Details  Technical Details Procedure performed: Right heart catheterization, calculation of cardiac output and cardiac index by Fick, ultrasound-guided access of the right radial artery, direct visualization of the artery and establishing presence of flow by color Doppler and image documentation in the chart, left heart catheterization including selective right and left coronary arteriogram, hemodynamic monitoring of the left ventricle and evaluation of pressure gradient across the aortic valve for aortic stenosis.  Indication: Patient with morbid obesity, hypertension, known moderate aortic stenosis, presents with worsening symptoms of dyspnea on exertion.  She had outpatient stress test which revealed normal LV systolic function however could not exclude inferolateral ischemia versus breast attenuation.  Medical therapy was recommended.  However patient persisted to have dyspnea on exertion hence brought to the cardiac catheterization lab to evaluate coronary anatomy and also to reevaluate aortic valve pressure gradients and right heart catheterization performed to calculate cardiac output and index.  Procedure performed:  Left heart catheterization including hemodynamic monitoring of the left ventricle, LV gram, selective right and left coronary arteriography. Right heart catheterization and cannulation of cardiac output and cardiac index by Fick.   Right AC vein access for right heart and Right radial access for left heart catheterization was utilized for performing  the procedure.   Technique:   A 5 French  sheath introduced into right right AC  vein for access under fluroscopic guidance.  A 5 French Swan-Ganz catheter was advanced with  balloon inflated  under fluoroscopic guidance into first the right atrium followed by the right ventricle and into the pulmonary artery to pulmonary artery wedge position. Hemodynamics were obtained in a locations.  After hemodynamics were completed, samples were taken for SaO2% measurement to be used in Fick  Calculation of cardiac output and cardiac index. The catheter was then pulled back the balloon down and then completely out of the body. Hemostasis obtained by manual pressure over the access site.  Under sterile precautions using a 6 French right radial  arterial access obtained under ultrasound guidance and image documentation on the chart, a 6 French sheath was introduced into the right radial artery. A 5 [place] Tig 4 catheter was advanced into the ascending aorta selective  right coronary artery and left coronary artery was cannulated and angiography was performed in multiple views. The catheter was pulled back Out of the body over exchange length J-wire.  Same catheter was used to perform LV hemodynamics. Catheter exchanged out of the body over J-Wire. NO immediate complications noted. Patient tolerated the procedure well. Contrast used: 50mL.   Estimated blood loss <50 mL.  During this procedure the patient was administered the following to achieve and maintain moderate conscious sedation: Versed 2 mg, Fentanyl 50 mcg,  while the patient's heart rate, blood pressure, and oxygen saturation were continuously monitored. The period of conscious sedation was 33 minutes, of which I was present face-to-face 100% of this time.  Medications (Filter: Administrations occurring from 10/04/17 1144 to 10/04/17 1309) (important)  Continuous medications are totaled by the amount administered until 10/04/17 1309.  Medication Rate/Dose/Volume Action  Date Time   midazolam (VERSED) injection (mg) 2 mg Given 10/04/17 1225   Total dose as of 10/04/17 1309        2 mg        fentaNYL (SUBLIMAZE) injection (mcg)  50 mcg Given 10/04/17 1225   Total dose as of 10/04/17 1309        50 mcg        lidocaine (PF) (XYLOCAINE) 1 % injection (mL) 2 mL Given 10/04/17 1227   Total dose as of 10/04/17 1309        2 mL        Radial Cocktail/Verapamil only (mL)  Given 10/04/17 1247   Total dose as of 10/04/17 1309        Cannot be calculated        heparin infusion 2 units/mL in 0.9 % sodium chloride (mL) 1,000 mL New Bag/Given 10/04/17 1300   Total dose as of 10/04/17 1309        Cannot be calculated        heparin injection (Units) 5,000 Units Given 10/04/17 1251   Total dose as of 10/04/17 1309        5,000 Units        iopamidol (ISOVUE-370) 76 % injection (mL) 40 mL Given 10/04/17 1300   Total dose as of 10/04/17 1309        40 mL        Sedation Time  Sedation Time Physician-1: 33 minutes 38 seconds  Contrast  Medication Name Total Dose  iopamidol (ISOVUE-370) 76 % injection 40 mL    Radiation/Fluoro  Fluoro time: 4.9 (min) DAP: 11350 (mGycm2) Cumulative Air Kerma:  200 (mGy)  Coronary Findings  Diagnostic Dominance: Right Left Main  Vessel was injected. Vessel is normal in caliber. Vessel is angiographically normal.  Left Anterior Descending  Vessel was injected. Vessel is normal in caliber. Vessel is angiographically normal.  Left Circumflex  Vessel was injected. Vessel is normal in caliber. Vessel is angiographically normal.  Right Coronary Artery  Vessel was injected. Vessel is normal in caliber. Vessel is angiographically normal.  Intervention  No interventions have been documented. Left Heart  Left Ventricle LV end diastolic pressure is normal.  Coronary Diagrams  Diagnostic Dominance: Right  Intervention  Implants   No implant documentation for this case.  Syngo Images  Show images for CARDIAC CATHETERIZATION  Images on Long Term Storage  Show images for [place], MIKKEL COFER to Procedure Log  Procedure Log    Hemo Data   Most Recent Value  Fick Cardiac  Output 4.67 L/min  Fick Cardiac Output Index 2.25 (L/min)/BSA  Aortic Mean Gradient 22.5 mmHg  Aortic Peak Gradient 27 mmHg  Aortic Valve Area 0.99  Aortic Value Area Index 0.48 cm2/BSA  RA A Wave 5 mmHg  RA V Wave 3 mmHg  RA Mean 1 mmHg  RV Systolic Pressure 24 mmHg  RV Diastolic Pressure 0 mmHg  RV EDP 4 mmHg  PA Systolic Pressure 27 mmHg  PA Diastolic Pressure 4 mmHg  PA Mean 15 mmHg  PW A Wave 12 mmHg  PW V Wave 16 mmHg  PW Mean 7 mmHg  AO Systolic Pressure 123456 mmHg  AO Diastolic Pressure 52 mmHg  AO Mean 74 mmHg  LV Systolic Pressure 123456 mmHg  LV Diastolic Pressure 3 mmHg  LV EDP 13 mmHg  AOp Systolic Pressure A999333 mmHg  AOp Diastolic Pressure 61 mmHg  AOp Mean Pressure 89 mmHg  LVp Systolic Pressure 0000000 mmHg  LVp Diastolic Pressure 3 mmHg  LVp EDP Pressure 15 mmHg  QP/QS 1  TPVR Index 6.67 HRUI  TSVR Index 32.94 HRUI  PVR SVR Ratio 0.11  TPVR/TSVR Ratio 0.2     Cardiac TAVR CT  TECHNIQUE: The patient was scanned on a Siemens Force [id number] slice scanner. A 120 kV retrospective scan was triggered in the descending thoracic aorta at 111 HU's. Gantry rotation speed was 270 msecs and collimation was .9 mm. No beta blockade or nitro were given. The 3D data set was reconstructed in 5% intervals of the R-R cycle. Systolic and diastolic phases were analyzed on a dedicated work station using MPR, MIP and VRT modes. The patient received 80 cc of contrast.  FINDINGS: Aortic Valve: Calcified tri leaflet with restricted leaflet motion  Aorta: Moderate calcific atherosclerosis with no aneurysm and normal arch vessels  Sinotubular Junction: 26 mm  Ascending Thoracic Aorta: 29 mm  Aortic Arch: 24 mm  Descending Thoracic Aorta: 24 mm  Sinus of Valsalva Measurements:  Non-coronary: 25 mm  Right - coronary: 27 mm  Left - coronary: 27.5 mm  Coronary Artery Height above Annulus:  Left Main: 11 mm above annulus  Right Coronary: 14.8 mm above  annulus  Virtual Basal Annulus Measurements:  Maximum/Minimum Diameter: 27.8 mm x 19.7 mm  Perimeter: 77 mm  Area: 425 mm2  Coronary Arteries: Sufficient height above annulus for deployment  Optimum Fluoroscopic Angle for Delivery: LAO 13 Caudal 14 degrees  IMPRESSION: 1. Tri leaflet AV with annular area of 425 mm2 suitable for a 23 mm Sapien 3 Ultra valve  2.  Coronary arteries sufficient height above annulus for  deployment  3.  Normal aortic root 2.9 cm  4. Optimum angiographic anlge for deployment LAO 14 Caudal 14 degrees  Jenkins Rouge   Electronically Signed   By: Jenkins Rouge M.D.   On: 01/17/2019 12:12   CT ANGIOGRAPHY CHEST, ABDOMEN AND PELVIS  TECHNIQUE: Multidetector CT imaging through the chest, abdomen and pelvis was performed using the standard protocol during bolus administration of intravenous contrast. Multiplanar reconstructed images and MIPs were obtained and reviewed to evaluate the vascular anatomy.  CONTRAST:  165mL OMNIPAQUE IOHEXOL 350 MG/ML SOLN  COMPARISON:  None.  FINDINGS: CTA CHEST FINDINGS  Cardiovascular: Heart size is mildly enlarged. There is no significant pericardial fluid, thickening or pericardial calcification. There is aortic atherosclerosis, as well as atherosclerosis of the great vessels of the mediastinum and the coronary arteries, including calcified atherosclerotic plaque in the left anterior descending and right coronary arteries. Severe thickening calcification of the aortic valve. Severe calcifications of the mitral annulus. Dilatation of the pulmonic trunk (3.5 cm in diameter).  Mediastinum/Lymph Nodes: No pathologically enlarged mediastinal or hilar lymph nodes. Esophagus is unremarkable in appearance. No axillary lymphadenopathy.  Lungs/Pleura: Patchy areas of ground-glass attenuation and interlobular septal thickening noted in the lungs bilaterally, which suggest a background of very  mild interstitial pulmonary edema. No confluent consolidative airspace disease. No pleural effusions. No suspicious appearing pulmonary nodules or masses are noted.  Musculoskeletal/Soft Tissues: There are no aggressive appearing lytic or blastic lesions noted in the visualized portions of the skeleton.  CTA ABDOMEN AND PELVIS FINDINGS  Hepatobiliary: No suspicious cystic or solid hepatic lesions. No intra or extrahepatic biliary ductal dilatation. Status post cholecystectomy.  Pancreas: No pancreatic mass. No pancreatic ductal dilatation. No pancreatic or peripancreatic fluid collections or inflammatory changes.  Spleen: Unremarkable.  Adrenals/Urinary Tract: Low-attenuation nonenhancing lesions in both kidneys, largest of which is in the medial aspect of the interpolar region of the right kidney measuring 2.5 cm in diameter, compatible with simple cysts. No suspicious renal lesions. Bilateral adrenal glands are normal in appearance. No hydroureteronephrosis. Urinary bladder is nearly decompressed, but otherwise unremarkable in appearance.  Stomach/Bowel: Normal appearance of the stomach. No pathologic dilatation of small bowel or colon. A few scattered colonic diverticulae are noted, particularly in the sigmoid colon, without surrounding inflammatory changes to suggest an acute diverticulitis at this time. Normal appendix.  Vascular/Lymphatic: Aortic atherosclerosis, without evidence of aneurysm or dissection in the abdominal or pelvic vasculature. Vascular findings and measurements pertinent to potential TAVR procedure, as detailed below. No lymphadenopathy noted in the abdomen or pelvis.  Reproductive: Uterus and ovaries are unremarkable in appearance.  Other: No significant volume of ascites.  No pneumoperitoneum.  Musculoskeletal: There are no aggressive appearing lytic or blastic lesions noted in the visualized portions of the skeleton.  VASCULAR  MEASUREMENTS PERTINENT TO TAVR:  AORTA:  Minimal Aortic Diameter- 14 x 15 mm  Severity of Aortic Calcification-mild  RIGHT PELVIS:  Right Common Iliac Artery -  Minimal Diameter-9.7 x 9.8 mm  Tortuosity-mild  Calcification-none  Right External Iliac Artery -  Minimal Diameter-7.3 x 7.4 mm  Tortuosity-moderate  Calcification-none  Right Common Femoral Artery -  Minimal Diameter-7.5 x 8.2 mm  Tortuosity-mild  Calcification-mild  LEFT PELVIS:  Left Common Iliac Artery -  Minimal Diameter-9.9 x 10.1 mm  Tortuosity-mild  Calcification-mild  Left External Iliac Artery -  Minimal Diameter-7.4 x 6.7 mm  Tortuosity-moderate to severe  Calcification-none  Left Common Femoral Artery -  Minimal Diameter-7.1 x 7.6 mm  Tortuosity-mild  Calcification-mild  Review of the MIP images confirms the above findings.  IMPRESSION: 1. Vascular findings and measurements pertinent to potential TAVR procedure, as detailed above. 2. Severe thickening and calcification of the aortic valve, compatible with the reported clinical history of severe aortic stenosis. 3. Mild cardiomegaly with evidence of very mild interstitial pulmonary edema in the lungs, suggesting mild congestive heart failure. 4. Dilatation of the pulmonic trunk, concerning for pulmonary arterial hypertension. 5. Aortic atherosclerosis, in addition to 2 vessel coronary artery disease. 6. Mild colonic diverticulosis without evidence of acute diverticulitis at this time. 7. Additional incidental findings, as above.   Electronically Signed   By: Vinnie Langton M.D.   On: 01/17/2019 12:05    EKG: NSR w/out acute ischemic changes and no significant AV conduction delay    Impression:  Patient has stage D severe symptomatic aortic stenosis.  She describes gradual progression of symptoms over the last several years including exertional shortness of breath and  fatigue consistent with chronic diastolic congestive heart failure, New York Heart Association functional class IIb.  I have personally reviewed the patient's most recent transthoracic echocardiogram performed at [facility] cardiovascular on November 24, 2018, diagnostic cardiac catheterization performed in 2019, and recent CT angiograms.  The patient's aortic valve appears trileaflet with moderate thickening, calcification, and restricted leaflet mobility involving all 3 leaflets.  Peak velocity across aortic valve measured as high as 4.0 m/s corresponding to mean transvalvular gradient estimated 38 mmHg and aortic valve area calculated 0.86 cm.  Previous diagnostic cardiac catheterization performed in 2019 revealed no significant coronary artery disease.  Cardiac-gated CTA of the heart reveals anatomical characteristics consistent with aortic stenosis suitable for treatment by transcatheter aortic valve replacement without any significant complicating features other than one small area of calcification in the aortic annulus.  CTA of the aorta and iliac vessels demonstrate what appears to be adequate pelvic vascular access to facilitate a transfemoral approach.  Twelve-lead EKG reveals sinus rhythm without any significant AV conduction delay.  I agree the patient would benefit from elective aortic valve replacement.  Risks associated with conventional surgery would be relatively low although increased somewhat because of the patient's morbid obesity, sleep apnea, diabetes, and significant physical deconditioning.  Transcatheter aortic valve replacement would be a reasonable alternative approach to conventional surgery.    Plan:  The patient and her husband were counseled at length regarding treatment alternatives for management of severe symptomatic aortic stenosis. Alternative approaches such as conventional aortic valve replacement, transcatheter aortic valve replacement, and continued medical therapy without  intervention were compared and contrasted at length.  The risks associated with conventional surgical aortic valve replacement were discussed in detail, as were expectations for post-operative convalescence.  Issues specific to transcatheter aortic valve replacement were discussed including questions about long term valve durability, the potential for paravalvular leak, possible increased risk of need for permanent pacemaker placement, and other technical complications related to the procedure itself.  Long-term prognosis with medical therapy was discussed. This discussion was placed in the context of the patient's own specific clinical presentation and past medical history.  All of their questions have been addressed.  The patient would like to proceed with transcatheter aortic valve replacement in the near future.  We tentatively plan to proceed on January 30, 2019.  Following the decision to proceed with transcatheter aortic valve replacement, a discussion has been held regarding what types of management strategies would be attempted intraoperatively in the event of life-threatening complications, including whether or not the  patient would be considered a candidate for the use of cardiopulmonary bypass and/or conversion to open sternotomy for attempted surgical intervention.  The patient has been advised of a variety of complications that might develop including but not limited to risks of death, stroke, paravalvular leak, aortic dissection or other major vascular complications, aortic annulus rupture, device embolization, cardiac rupture or perforation, mitral regurgitation, acute myocardial infarction, arrhythmia, heart block or bradycardia requiring permanent pacemaker placement, congestive heart failure, respiratory failure, renal failure, pneumonia, infection, other late complications related to structural valve deterioration or migration, or other complications that might ultimately cause a temporary or  permanent loss of functional independence or other long term morbidity.  The patient provides full informed consent for the procedure as described and all questions were answered.    I spent in excess of 90 minutes during the conduct of this office consultation and >50% of this time involved direct face-to-face encounter with the patient for counseling and/or coordination of their care.   Valentina Gu. Roxy Manns, MD 01/22/2019 11:24 AM

## 2019-01-25 NOTE — Pre-Procedure Instructions (Signed)
[id number] - [place], [place] - [address] [place] [address] Phone: [phone] Fax: [phone]      Your procedure is scheduled on 01-29-29  Report to [facility] Main Entrance "A" at 0530A.M., and check in at the Admitting office.  Call this number if you have problems the morning of surgery:  [phone]  Call [phone] if you have any questions prior to your surgery date Monday-Friday 8am-4pm    Remember:  Do not eat or drink after midnight the night before your surgery   Take these medicines the morning of surgery with A SIP OF WATER :NEXIUM  7 days prior to surgery STOP taking any Aspirin (unless otherwise instructed by your surgeon), Aleve, Naproxen, Ibuprofen, Motrin, Advil, Goody's, BC's, all herbal medications, fish oil, and all vitamins.   WHAT DO I DO ABOUT MY DIABETES MEDICATION?   STOP taking Metformin on 01/28/19   How to Manage Your Diabetes Before and After Surgery  Why is it important to control my blood sugar before and after surgery? . Improving blood sugar levels before and after surgery helps healing and can limit problems. . A way of improving blood sugar control is eating a healthy diet by: o  Eating less sugar and carbohydrates o  Increasing activity/exercise o  Talking with your doctor about reaching your blood sugar goals . High blood sugars (greater than 180 mg/dL) can raise your risk of infections and slow your recovery, so you will need to focus on controlling your diabetes during the weeks before surgery. . Make sure that the doctor who takes care of your diabetes knows about your planned surgery including the date and location.  How do I manage my blood sugar before surgery? . Check your blood sugar at least 4 times a day, starting 2 days before surgery, to make sure that the level is not too high or low. o Check your blood sugar the morning of your  surgery when you wake up and every 2 hours until you get to the Short Stay unit. . If your blood sugar is less than 70 mg/dL, you will need to treat for low blood sugar: o Do not take insulin. o Treat a low blood sugar (less than 70 mg/dL) with  cup of clear juice (cranberry or apple), 4 glucose tablets, OR glucose gel. o Recheck blood sugar in 15 minutes after treatment (to make sure it is greater than 70 mg/dL). If your blood sugar is not greater than 70 mg/dL on recheck, call [phone] for further instructions. . Report your blood sugar to the short stay nurse when you get to Short Stay.  . If you are admitted to the hospital after surgery: o Your blood sugar will be checked by the staff and you will probably be given insulin after surgery (instead of oral diabetes medicines) to make sure you have good blood sugar levels. o The goal for blood sugar control after surgery is 80-180 mg/dL.   The Morning of Surgery  Do not wear jewelry, make-up or nail polish.  Do not wear lotions, powders, or perfumes, or deodorant  Do not shave 48 hours prior to surgery.   Do not bring valuables to the hospital.  [facility] is not responsible for any belongings or valuables.  If you are a smoker, DO NOT Smoke 24 hours prior to surgery IF you wear a CPAP at night please bring  your mask, tubing, and machine the morning of surgery   Remember that you must have someone to transport you home after your surgery, and remain with you for 24 hours if you are discharged the same day.   Contacts, glasses, hearing aids, dentures or bridgework may not be worn into surgery.    Leave your suitcase in the car.  After surgery it may be brought to your room.  For patients admitted to the hospital, discharge time will be determined by your treatment team.  Patients discharged the day of surgery will not be allowed to drive home.    Special instructions:   [facility]- Preparing For Surgery  Before surgery,  you can play an important role. Because skin is not sterile, your skin needs to be as free of germs as possible. You can reduce the number of germs on your skin by washing with CHG (chlorahexidine gluconate) Soap before surgery.  CHG is an antiseptic cleaner which kills germs and bonds with the skin to continue killing germs even after washing.    Oral Hygiene is also important to reduce your risk of infection.  Remember - BRUSH YOUR TEETH THE MORNING OF SURGERY WITH YOUR REGULAR TOOTHPASTE  Please do not use if you have an allergy to CHG or antibacterial soaps. If your skin becomes reddened/irritated stop using the CHG.  Do not shave (including legs and underarms) for at least 48 hours prior to first CHG shower. It is OK to shave your face.  Please follow these instructions carefully.   1. Shower the NIGHT BEFORE SURGERY and the MORNING OF SURGERY with CHG Soap.   2. If you chose to wash your hair, wash your hair first as usual with your normal shampoo.  3. After you shampoo, rinse your hair and body thoroughly to remove the shampoo.  4. Use CHG as you would any other liquid soap. You can apply CHG directly to the skin and wash gently with a scrungie or a clean washcloth.   5. Apply the CHG Soap to your body ONLY FROM THE NECK DOWN.  Do not use on open wounds or open sores. Avoid contact with your eyes, ears, mouth and genitals (private parts). Wash Face and genitals (private parts)  with your normal soap.   6. Wash thoroughly, paying special attention to the area where your surgery will be performed.  7. Thoroughly rinse your body with warm water from the neck down.  8. DO NOT shower/wash with your normal soap after using and rinsing off the CHG Soap.  9. Pat yourself dry with a CLEAN TOWEL.  10. Wear CLEAN PAJAMAS to bed the night before surgery, wear comfortable clothes the morning of surgery  11. Place CLEAN SHEETS on your bed the night of your first shower and DO NOT SLEEP WITH  PETS.   Day of Surgery:  Do not apply any deodorants/lotions. Please shower the morning of surgery with the CHG soap  Please wear clean clothes to the hospital/surgery center.   Remember to brush your teeth WITH YOUR REGULAR TOOTHPASTE.   Please read over the  fact sheets that you were given.

## 2019-01-26 ENCOUNTER — Encounter (HOSPITAL_COMMUNITY): Payer: Self-pay

## 2019-01-26 ENCOUNTER — Other Ambulatory Visit (HOSPITAL_COMMUNITY)
Admission: RE | Admit: 2019-01-26 | Discharge: 2019-01-26 | Disposition: A | Discharge status: Home / Self Care | Payer: Medicare Other | Source: Ambulatory Visit | Attending: Cardiovascular Disease | Admitting: Cardiovascular Disease

## 2019-01-26 ENCOUNTER — Other Ambulatory Visit: Payer: Self-pay

## 2019-01-26 ENCOUNTER — Encounter (HOSPITAL_COMMUNITY)
Admission: RE | Admit: 2019-01-26 | Discharge: 2019-01-26 | Disposition: A | Discharge status: Home / Self Care | Payer: Medicare Other | Source: Ambulatory Visit | Attending: Cardiovascular Disease | Admitting: Cardiovascular Disease

## 2019-01-26 ENCOUNTER — Ambulatory Visit (HOSPITAL_COMMUNITY)
Admission: RE | Admit: 2019-01-26 | Discharge: 2019-01-26 | Disposition: A | Discharge status: Home / Self Care | Payer: Medicare Other | Source: Ambulatory Visit | Attending: Cardiovascular Disease | Admitting: Cardiovascular Disease

## 2019-01-26 DIAGNOSIS — Z01818 Encounter for other preprocedural examination: Secondary | ICD-10-CM | POA: Insufficient documentation

## 2019-01-26 DIAGNOSIS — Z20828 Contact with and (suspected) exposure to other viral communicable diseases: Secondary | ICD-10-CM | POA: Diagnosis not present

## 2019-01-26 DIAGNOSIS — I350 Nonrheumatic aortic (valve) stenosis: Secondary | ICD-10-CM

## 2019-01-26 HISTORY — DX: Anxiety disorder, unspecified: F41.9

## 2019-01-26 LAB — COMPREHENSIVE METABOLIC PANEL
ALT: 28 U/L (ref 0–44)
AST: 34 U/L (ref 15–41)
Albumin: 3.5 g/dL (ref 3.5–5.0)
Alkaline Phosphatase: 53 U/L (ref 38–126)
Anion gap: 8 (ref 5–15)
BUN: 11 mg/dL (ref 8–23)
CO2: 23 mmol/L (ref 22–32)
Calcium: 9.2 mg/dL (ref 8.9–10.3)
Chloride: 107 mmol/L (ref 98–111)
Creatinine, Ser: 0.67 mg/dL (ref 0.44–1.00)
GFR calc Af Amer: >60 mL/min (ref >60)
GFR calc non Af Amer: >60 mL/min (ref >60)
Glucose, Bld: 204 mg/dL — ABNORMAL HIGH (ref 70–99)
Potassium: 4.4 mmol/L (ref 3.5–5.1)
Sodium: 138 mmol/L (ref 135–145)
Total Bilirubin: 0.6 mg/dL (ref 0.3–1.2)
Total Protein: 7.3 g/dL (ref 6.5–8.1)

## 2019-01-26 LAB — URINALYSIS, ROUTINE W REFLEX MICROSCOPIC
Bacteria, UA: NONE SEEN
Bilirubin Urine: NEGATIVE
Glucose, UA: NEGATIVE mg/dL
Ketones, ur: NEGATIVE mg/dL
Leukocytes,Ua: NEGATIVE
Nitrite: NEGATIVE
Protein, ur: 30 mg/dL — AB
Specific Gravity, Urine: 1.026 (ref 1.005–1.030)
pH: 5 (ref 5.0–8.0)

## 2019-01-26 LAB — TYPE AND SCREEN
ABO/RH(D): A POS
Antibody Screen: NEGATIVE

## 2019-01-26 LAB — SURGICAL PCR SCREEN
MRSA, PCR: NEGATIVE
Staphylococcus aureus: POSITIVE — AB

## 2019-01-26 LAB — APTT: aPTT: 29 seconds (ref 24–36)

## 2019-01-26 LAB — CBC
HCT: 37.6 % (ref 36.0–46.0)
Hemoglobin: 12.2 g/dL (ref 12.0–15.0)
MCH: 30.7 pg (ref 26.0–34.0)
MCHC: 32.4 g/dL (ref 30.0–36.0)
MCV: 94.5 fL (ref 80.0–100.0)
Platelets: 164 10*3/uL (ref 150–400)
RBC: 3.98 MIL/uL (ref 3.87–5.11)
RDW: 13.1 % (ref 11.5–15.5)
WBC: 7.9 10*3/uL (ref 4.0–10.5)
nRBC: 0 % (ref 0.0–0.2)

## 2019-01-26 LAB — PROTIME-INR
INR: 1 (ref 0.8–1.2)
Prothrombin Time: 13.1 seconds (ref 11.4–15.2)

## 2019-01-26 LAB — BLOOD GAS, ARTERIAL
Acid-base deficit: 2.5 mmol/L — ABNORMAL HIGH (ref 0.0–2.0)
Bicarbonate: 20.9 mmol/L (ref 20.0–28.0)
Drawn by: 265211
FIO2: 21
O2 Saturation: 87.6 %
Patient temperature: 98.6
pCO2 arterial: 30.4 mmHg — ABNORMAL LOW (ref 32.0–48.0)
pH, Arterial: 7.451 — ABNORMAL HIGH (ref 7.350–7.450)
pO2, Arterial: 51.1 mmHg — ABNORMAL LOW (ref 83.0–108.0)

## 2019-01-26 LAB — HEMOGLOBIN A1C
Hgb A1c MFr Bld: 7.2 % — ABNORMAL HIGH (ref 4.8–5.6)
Mean Plasma Glucose: 159.94 mg/dL

## 2019-01-26 LAB — GLUCOSE, CAPILLARY: Glucose-Capillary: 241 mg/dL — ABNORMAL HIGH (ref 70–99)

## 2019-01-26 LAB — BRAIN NATRIURETIC PEPTIDE: B Natriuretic Peptide: 101.3 pg/mL — ABNORMAL HIGH (ref 0.0–100.0)

## 2019-01-26 NOTE — Progress Notes (Signed)
PCP - Dr. Reita Cliche Cardiologist - Dr. Einar Gip  Chest x-ray - 01/26/2019 EKG - 01/01/19 Stress Test - 09/16/2017 ECHO - 01/22/2019 Cardiac Cath - 10/04/2017  Sleep Study - yes, per patient "many years ago"; she is not sure where sleep study was done, thinks it was somewhere in [place] CPAP - yes, "every night', patient instructed to bring with them the DOS  Fasting Blood Sugar - 130s Checks Blood Sugar 1 time a week Per Dr. Burt Knack, stop Metformin 01/28/2019  Blood Thinner Instructions: Aspirin Instructions: per Dr. Burt Knack, continue through to the day before surgery  Anesthesia review: yes; note in chart about requiring Lidocaine with ETT  Patient denies shortness of breath, fever, cough and chest pain at PAT appointment   Patient verbalized understanding of instructions that were given to them at the PAT appointment. Patient was also instructed that they will need to review over the PAT instructions again at home before surgery.

## 2019-01-29 LAB — NOVEL CORONAVIRUS, NAA (HOSP ORDER, SEND-OUT TO REF LAB; TAT 18-24 HRS): SARS-CoV-2, NAA: NOT DETECTED

## 2019-01-29 MED ORDER — POTASSIUM CHLORIDE 2 MEQ/ML IV SOLN
80.0000 meq | INTRAVENOUS | Status: DC
  Filled 2019-01-29: qty 40

## 2019-01-29 MED ORDER — SODIUM CHLORIDE 0.9 % IV SOLN
1.5000 g | INTRAVENOUS | Status: DC
  Filled 2019-01-29: qty 1.5

## 2019-01-29 MED ORDER — MAGNESIUM SULFATE 50 % IJ SOLN
40.0000 meq | INTRAMUSCULAR | Status: DC
  Filled 2019-01-29: qty 9.85

## 2019-01-29 MED ORDER — DEXMEDETOMIDINE HCL IN NACL 400 MCG/100ML IV SOLN
0.1000 ug/kg/h | INTRAVENOUS | Status: AC
  Administered 2019-01-30: 11:00:00 1 ug/kg/h via INTRAVENOUS
  Filled 2019-01-29: qty 100

## 2019-01-29 MED ORDER — NOREPINEPHRINE 4 MG/250ML-% IV SOLN
0.0000 ug/min | INTRAVENOUS | Status: DC
  Filled 2019-01-29: qty 250

## 2019-01-29 MED ORDER — SODIUM CHLORIDE 0.9 % IV SOLN
INTRAVENOUS | Status: DC
  Filled 2019-01-29: qty 30

## 2019-01-29 MED ORDER — VANCOMYCIN HCL 10 G IV SOLR
1500.0000 mg | INTRAVENOUS | Status: AC
  Administered 2019-01-30: 10:00:00 via INTRAVENOUS
  Filled 2019-01-29: qty 1500

## 2019-01-29 NOTE — Progress Notes (Signed)
Anesthesia Chart Review: Pt brought a note written by anesthesiologist from a previous surgery regarding need for ETT lidocaine during intubation. Note on chart states "Mrs. Swearengin required ETT lidocaine 50 mg to tolerate ETT and resulted in improvement of SPO2 which had dropped secondary to bucking and coughing, after the lidocaine she improved her SPO2 and own ability to assist her respirations."  Pt did have GA at [facility] in 2015 for R unicompartmental knee replacement. Review of records does not indicate any complications with general endotracheal intubation.   Wynonia Musty [facility] Short Stay Center/Anesthesiology Phone [phone] 01/29/2019 9:56 AM

## 2019-01-29 NOTE — Anesthesia Preprocedure Evaluation (Addendum)
Anesthesia Evaluation  Patient identified by MRN, date of birth, ID band Patient awake    Reviewed: Allergy & Precautions, NPO status , Patient's Chart, lab work & pertinent test results  Airway Mallampati: II  TM Distance: >3 FB Neck ROM: Full    Dental  (+) Edentulous Upper, Edentulous Lower   Pulmonary    breath sounds clear to auscultation       Cardiovascular  Rhythm:Regular Rate:Normal + Systolic murmurs    Neuro/Psych    GI/Hepatic   Endo/Other  diabetes  Renal/GU      Musculoskeletal   Abdominal (+) + obese,   Peds  Hematology   Anesthesia Other Findings   Reproductive/Obstetrics                            Anesthesia Physical Anesthesia Plan  ASA: III  Anesthesia Plan: MAC   Post-op Pain Management:    Induction: Intravenous  PONV Risk Score and Plan: Ondansetron  Airway Management Planned: Natural Airway and Nasal Cannula  Additional Equipment:   Intra-op Plan:   Post-operative Plan:   Informed Consent: I have reviewed the patients History and Physical, chart, labs and discussed the procedure including the risks, benefits and alternatives for the proposed anesthesia with the patient or authorized representative who has indicated his/her understanding and acceptance.       Plan Discussed with: CRNA and Anesthesiologist  Anesthesia Plan Comments: (Pt brought a note written by anesthesiologist from a previous surgery regarding need for ETT lidocaine during intubation. Note on chart states "Kara Lee required ETT lidocaine 50 mg to tolerate ETT and resulted in improvement of SPO2 which had dropped secondary to bucking and coughing, after the lidocaine she improved her SPO2 and own ability to assist her respirations."  Pt did have GA at [facility] in 2015 for R unicompartmental knee replacement. Review of records does not indicate any complications with general endotracheal  intubation.  )     Anesthesia Quick Evaluation

## 2019-01-30 ENCOUNTER — Inpatient Hospital Stay (HOSPITAL_COMMUNITY): Payer: Medicare Other | Admitting: Anesthesiology

## 2019-01-30 ENCOUNTER — Inpatient Hospital Stay (HOSPITAL_COMMUNITY): Payer: Medicare Other

## 2019-01-30 ENCOUNTER — Encounter (HOSPITAL_COMMUNITY): Payer: Self-pay

## 2019-01-30 ENCOUNTER — Inpatient Hospital Stay (HOSPITAL_COMMUNITY)
Admission: RE | Admit: 2019-01-30 | Discharge: 2019-01-31 | DRG: 267 | Disposition: A | Discharge status: Home / Self Care | Payer: Medicare Other | Source: Ambulatory Visit | Attending: Thoracic Surgery (Cardiothoracic Vascular Surgery) | Admitting: Thoracic Surgery (Cardiothoracic Vascular Surgery)

## 2019-01-30 ENCOUNTER — Other Ambulatory Visit: Payer: Self-pay

## 2019-01-30 ENCOUNTER — Inpatient Hospital Stay (HOSPITAL_COMMUNITY)
Admission: RE | Admit: 2019-01-30 | Discharge: 2019-01-30 | Disposition: A | Discharge status: Home / Self Care | Payer: Medicare Other | Source: Ambulatory Visit | Attending: Cardiovascular Disease | Admitting: Cardiovascular Disease

## 2019-01-30 ENCOUNTER — Encounter (HOSPITAL_COMMUNITY)
Admission: RE | Disposition: A | Discharge status: Home / Self Care | Payer: Self-pay | Source: Ambulatory Visit | Attending: Thoracic Surgery (Cardiothoracic Vascular Surgery)

## 2019-01-30 ENCOUNTER — Other Ambulatory Visit: Payer: Self-pay | Admitting: Physician Assistant

## 2019-01-30 ENCOUNTER — Inpatient Hospital Stay (HOSPITAL_COMMUNITY): Payer: Medicare Other | Admitting: Vascular Surgery

## 2019-01-30 DIAGNOSIS — Z952 Presence of prosthetic heart valve: Secondary | ICD-10-CM

## 2019-01-30 DIAGNOSIS — E785 Hyperlipidemia, unspecified: Secondary | ICD-10-CM | POA: Diagnosis present

## 2019-01-30 DIAGNOSIS — Z79899 Other long term (current) drug therapy: Secondary | ICD-10-CM | POA: Diagnosis not present

## 2019-01-30 DIAGNOSIS — Z7982 Long term (current) use of aspirin: Secondary | ICD-10-CM

## 2019-01-30 DIAGNOSIS — Z006 Encounter for examination for normal comparison and control in clinical research program: Secondary | ICD-10-CM

## 2019-01-30 DIAGNOSIS — K760 Fatty (change of) liver, not elsewhere classified: Secondary | ICD-10-CM | POA: Diagnosis not present

## 2019-01-30 DIAGNOSIS — Z6841 Body Mass Index (BMI) 40.0 and over, adult: Secondary | ICD-10-CM

## 2019-01-30 DIAGNOSIS — Z7984 Long term (current) use of oral hypoglycemic drugs: Secondary | ICD-10-CM

## 2019-01-30 DIAGNOSIS — E119 Type 2 diabetes mellitus without complications: Secondary | ICD-10-CM

## 2019-01-30 DIAGNOSIS — I110 Hypertensive heart disease with heart failure: Secondary | ICD-10-CM | POA: Diagnosis present

## 2019-01-30 DIAGNOSIS — G4733 Obstructive sleep apnea (adult) (pediatric): Secondary | ICD-10-CM | POA: Diagnosis present

## 2019-01-30 DIAGNOSIS — Z833 Family history of diabetes mellitus: Secondary | ICD-10-CM | POA: Diagnosis not present

## 2019-01-30 DIAGNOSIS — K219 Gastro-esophageal reflux disease without esophagitis: Secondary | ICD-10-CM | POA: Diagnosis present

## 2019-01-30 DIAGNOSIS — Z9049 Acquired absence of other specified parts of digestive tract: Secondary | ICD-10-CM | POA: Diagnosis not present

## 2019-01-30 DIAGNOSIS — I350 Nonrheumatic aortic (valve) stenosis: Secondary | ICD-10-CM

## 2019-01-30 DIAGNOSIS — I5032 Chronic diastolic (congestive) heart failure: Secondary | ICD-10-CM | POA: Diagnosis not present

## 2019-01-30 DIAGNOSIS — Z9989 Dependence on other enabling machines and devices: Secondary | ICD-10-CM

## 2019-01-30 HISTORY — PX: TEE WITHOUT CARDIOVERSION: SHX5443

## 2019-01-30 HISTORY — PX: TRANSCATHETER AORTIC VALVE REPLACEMENT, TRANSFEMORAL: SHX6400

## 2019-01-30 LAB — POCT I-STAT, CHEM 8
BUN: 11 mg/dL (ref 8–23)
BUN: 14 mg/dL (ref 8–23)
BUN: 11 mg/dL (ref 8–23)
BUN: 11 mg/dL (ref 8–23)
Calcium, Ion: 1.21 mmol/L (ref 1.15–1.40)
Calcium, Ion: 1.24 mmol/L (ref 1.15–1.40)
Calcium, Ion: 1.19 mmol/L (ref 1.15–1.40)
Calcium, Ion: 1.21 mmol/L (ref 1.15–1.40)
Chloride: 107 mmol/L (ref 98–111)
Chloride: 108 mmol/L (ref 98–111)
Chloride: 108 mmol/L (ref 98–111)
Chloride: 108 mmol/L (ref 98–111)
Creatinine, Ser: 0.4 mg/dL — ABNORMAL LOW (ref 0.44–1.00)
Creatinine, Ser: 0.4 mg/dL — ABNORMAL LOW (ref 0.44–1.00)
Creatinine, Ser: 0.4 mg/dL — ABNORMAL LOW (ref 0.44–1.00)
Creatinine, Ser: 0.5 mg/dL (ref 0.44–1.00)
Glucose, Bld: 150 mg/dL — ABNORMAL HIGH (ref 70–99)
Glucose, Bld: 183 mg/dL — ABNORMAL HIGH (ref 70–99)
Glucose, Bld: 188 mg/dL — ABNORMAL HIGH (ref 70–99)
Glucose, Bld: 190 mg/dL — ABNORMAL HIGH (ref 70–99)
HCT: 34 % — ABNORMAL LOW (ref 36.0–46.0)
HCT: 35 % — ABNORMAL LOW (ref 36.0–46.0)
HCT: 33 % — ABNORMAL LOW (ref 36.0–46.0)
HCT: 36 % (ref 36.0–46.0)
Hemoglobin: 11.6 g/dL — ABNORMAL LOW (ref 12.0–15.0)
Hemoglobin: 11.9 g/dL — ABNORMAL LOW (ref 12.0–15.0)
Hemoglobin: 11.2 g/dL — ABNORMAL LOW (ref 12.0–15.0)
Hemoglobin: 12.2 g/dL (ref 12.0–15.0)
Potassium: 4.1 mmol/L (ref 3.5–5.1)
Potassium: 4.3 mmol/L (ref 3.5–5.1)
Potassium: 4 mmol/L (ref 3.5–5.1)
Potassium: 4.3 mmol/L (ref 3.5–5.1)
Sodium: 142 mmol/L (ref 135–145)
Sodium: 142 mmol/L (ref 135–145)
Sodium: 141 mmol/L (ref 135–145)
Sodium: 142 mmol/L (ref 135–145)
TCO2: 22 mmol/L (ref 22–32)
TCO2: 23 mmol/L (ref 22–32)
TCO2: 21 mmol/L — ABNORMAL LOW (ref 22–32)
TCO2: 22 mmol/L (ref 22–32)

## 2019-01-30 LAB — GLUCOSE, CAPILLARY
Glucose-Capillary: 160 mg/dL — ABNORMAL HIGH (ref 70–99)
Glucose-Capillary: 148 mg/dL — ABNORMAL HIGH (ref 70–99)
Glucose-Capillary: 98 mg/dL (ref 70–99)
Glucose-Capillary: 197 mg/dL — ABNORMAL HIGH (ref 70–99)

## 2019-01-30 LAB — POCT ACTIVATED CLOTTING TIME
Activated Clotting Time: 120 seconds
Activated Clotting Time: 257 seconds
Activated Clotting Time: 109 seconds

## 2019-01-30 SURGERY — IMPLANTATION, AORTIC VALVE, TRANSCATHETER, FEMORAL APPROACH
Anesthesia: General | Site: Chest

## 2019-01-30 MED ORDER — SODIUM CHLORIDE 0.9 % IV SOLN: 250.0000 mL | INTRAVENOUS | Status: DC | PRN

## 2019-01-30 MED ORDER — ACETAMINOPHEN 650 MG RE SUPP: 650.0000 mg | Freq: Four times a day (QID) | RECTAL | Status: DC | PRN

## 2019-01-30 MED ORDER — HEPARIN SODIUM (PORCINE) 1000 UNIT/ML IJ SOLN
INTRAMUSCULAR | Status: AC
  Filled 2019-01-30: qty 1

## 2019-01-30 MED ORDER — LORATADINE 10 MG PO TABS
10.0000 mg | ORAL_TABLET | Freq: Every day | ORAL | Status: DC
  Administered 2019-01-31: 10 mg via ORAL
  Filled 2019-01-30: qty 1

## 2019-01-30 MED ORDER — TRAMADOL HCL 50 MG PO TABS: 50.0000 mg | ORAL_TABLET | ORAL | Status: DC | PRN

## 2019-01-30 MED ORDER — HEPARIN (PORCINE) IN NACL 1000-0.9 UT/500ML-% IV SOLN
INTRAVENOUS | Status: DC | PRN
  Administered 2019-01-30 (×3): 500 mL

## 2019-01-30 MED ORDER — VANCOMYCIN HCL IN DEXTROSE 1-5 GM/200ML-% IV SOLN
1000.0000 mg | Freq: Once | INTRAVENOUS | Status: AC
  Administered 2019-01-30: 1000 mg via INTRAVENOUS
  Filled 2019-01-30: qty 200

## 2019-01-30 MED ORDER — FENTANYL CITRATE (PF) 100 MCG/2ML IJ SOLN
INTRAMUSCULAR | Status: DC | PRN
  Administered 2019-01-30: 50 ug via INTRAVENOUS

## 2019-01-30 MED ORDER — HEPARIN SODIUM (PORCINE) 1000 UNIT/ML IJ SOLN
INTRAMUSCULAR | Status: DC | PRN
  Administered 2019-01-30: 16000 [IU] via INTRAVENOUS

## 2019-01-30 MED ORDER — IOHEXOL 350 MG/ML SOLN
INTRAVENOUS | Status: DC | PRN
  Administered 2019-01-30: 40 mL

## 2019-01-30 MED ORDER — FENTANYL CITRATE (PF) 100 MCG/2ML IJ SOLN
INTRAMUSCULAR | Status: AC
  Filled 2019-01-30: qty 2

## 2019-01-30 MED ORDER — PANTOPRAZOLE SODIUM 40 MG PO TBEC
40.0000 mg | DELAYED_RELEASE_TABLET | Freq: Every day | ORAL | Status: DC
  Administered 2019-01-31: 40 mg via ORAL
  Filled 2019-01-30: qty 1

## 2019-01-30 MED ORDER — CLONAZEPAM 0.5 MG PO TABS
0.5000 mg | ORAL_TABLET | Freq: Two times a day (BID) | ORAL | Status: DC
  Administered 2019-01-30 – 2019-01-31 (×2): 0.5 mg via ORAL
  Filled 2019-01-30 (×2): qty 1

## 2019-01-30 MED ORDER — CHLORHEXIDINE GLUCONATE 4 % EX LIQD: 30.0000 mL | CUTANEOUS | Status: DC

## 2019-01-30 MED ORDER — SODIUM CHLORIDE 0.9% FLUSH
3.0000 mL | Freq: Two times a day (BID) | INTRAVENOUS | Status: DC
  Administered 2019-01-30 – 2019-01-31 (×2): 3 mL via INTRAVENOUS

## 2019-01-30 MED ORDER — CHLORHEXIDINE GLUCONATE 4 % EX LIQD: 60.0000 mL | Freq: Once | CUTANEOUS | Status: DC

## 2019-01-30 MED ORDER — CHLORHEXIDINE GLUCONATE 0.12 % MT SOLN
15.0000 mL | Freq: Once | OROMUCOSAL | Status: AC
  Administered 2019-01-30: 15 mL via OROMUCOSAL
  Filled 2019-01-30 (×2): qty 15

## 2019-01-30 MED ORDER — NITROGLYCERIN IN D5W 200-5 MCG/ML-% IV SOLN: 0.0000 ug/min | INTRAVENOUS | Status: DC

## 2019-01-30 MED ORDER — SODIUM CHLORIDE 0.9 % IV SOLN
1.5000 g | Freq: Two times a day (BID) | INTRAVENOUS | Status: DC
  Administered 2019-01-30 – 2019-01-31 (×2): 1.5 g via INTRAVENOUS
  Filled 2019-01-30 (×4): qty 1.5

## 2019-01-30 MED ORDER — MORPHINE SULFATE (PF) 2 MG/ML IV SOLN: 1.0000 mg | INTRAVENOUS | Status: DC | PRN

## 2019-01-30 MED ORDER — LIDOCAINE HCL (PF) 1 % IJ SOLN
INTRAMUSCULAR | Status: DC | PRN
  Administered 2019-01-30: 30 mL

## 2019-01-30 MED ORDER — HEPARIN (PORCINE) IN NACL 1000-0.9 UT/500ML-% IV SOLN
INTRAVENOUS | Status: AC
  Filled 2019-01-30: qty 1500

## 2019-01-30 MED ORDER — OXYCODONE HCL 5 MG PO TABS: 5.0000 mg | ORAL_TABLET | ORAL | Status: DC | PRN

## 2019-01-30 MED ORDER — LIDOCAINE HCL (PF) 1 % IJ SOLN
INTRAMUSCULAR | Status: AC
  Filled 2019-01-30: qty 30

## 2019-01-30 MED ORDER — LACTATED RINGERS IV SOLN
INTRAVENOUS | Status: DC
  Administered 2019-01-30 (×2): via INTRAVENOUS

## 2019-01-30 MED ORDER — ONDANSETRON HCL 4 MG/2ML IJ SOLN
INTRAMUSCULAR | Status: DC | PRN
  Administered 2019-01-30: 4 mg via INTRAVENOUS

## 2019-01-30 MED ORDER — FLUOXETINE HCL 20 MG PO CAPS
40.0000 mg | ORAL_CAPSULE | Freq: Every day | ORAL | Status: DC
  Administered 2019-01-30 – 2019-01-31 (×2): 40 mg via ORAL
  Filled 2019-01-30 (×2): qty 2

## 2019-01-30 MED ORDER — PROPOFOL 500 MG/50ML IV EMUL
INTRAVENOUS | Status: DC | PRN
  Administered 2019-01-30: 10 ug/kg/min via INTRAVENOUS

## 2019-01-30 MED ORDER — ONDANSETRON HCL 4 MG/2ML IJ SOLN
4.0000 mg | Freq: Four times a day (QID) | INTRAMUSCULAR | Status: DC | PRN
  Administered 2019-01-30: 4 mg via INTRAVENOUS
  Filled 2019-01-30: qty 2

## 2019-01-30 MED ORDER — LACTATED RINGERS IV SOLN: INTRAVENOUS | Status: DC | PRN

## 2019-01-30 MED ORDER — SODIUM CHLORIDE 0.9 % IV SOLN
INTRAVENOUS | Status: DC
  Administered 2019-01-30: 08:00:00 via INTRAVENOUS

## 2019-01-30 MED ORDER — ASPIRIN EC 81 MG PO TBEC
81.0000 mg | DELAYED_RELEASE_TABLET | Freq: Every day | ORAL | Status: DC
  Administered 2019-01-30: 81 mg via ORAL
  Filled 2019-01-30: qty 1

## 2019-01-30 MED ORDER — SODIUM CHLORIDE 0.9% FLUSH: 3.0000 mL | INTRAVENOUS | Status: DC | PRN

## 2019-01-30 MED ORDER — INSULIN ASPART 100 UNIT/ML ~~LOC~~ SOLN
0.0000 [IU] | Freq: Three times a day (TID) | SUBCUTANEOUS | Status: DC
  Administered 2019-01-30 – 2019-01-31 (×2): 4 [IU] via SUBCUTANEOUS
  Administered 2019-01-31: 2 [IU] via SUBCUTANEOUS

## 2019-01-30 MED ORDER — CLOPIDOGREL BISULFATE 75 MG PO TABS
75.0000 mg | ORAL_TABLET | Freq: Every day | ORAL | Status: DC
  Administered 2019-01-31: 75 mg via ORAL
  Filled 2019-01-30: qty 1

## 2019-01-30 MED ORDER — ACETAMINOPHEN 325 MG PO TABS
650.0000 mg | ORAL_TABLET | Freq: Four times a day (QID) | ORAL | Status: DC | PRN
  Administered 2019-01-30: 650 mg via ORAL
  Filled 2019-01-30: qty 2

## 2019-01-30 MED ORDER — PROPOFOL 10 MG/ML IV BOLUS
INTRAVENOUS | Status: DC | PRN
  Administered 2019-01-30: 10 mg via INTRAVENOUS
  Administered 2019-01-30: 20 mg via INTRAVENOUS
  Administered 2019-01-30: 10 mg via INTRAVENOUS

## 2019-01-30 MED ORDER — PHENYLEPHRINE HCL-NACL 20-0.9 MG/250ML-% IV SOLN
0.0000 ug/min | INTRAVENOUS | Status: DC
  Filled 2019-01-30: qty 250

## 2019-01-30 MED ORDER — DEXMEDETOMIDINE HCL 200 MCG/2ML IV SOLN
INTRAVENOUS | Status: DC | PRN
  Administered 2019-01-30: 110.5 ug via INTRAVENOUS

## 2019-01-30 MED ORDER — SODIUM CHLORIDE 0.9 % IV SOLN
INTRAVENOUS | Status: DC
  Administered 2019-01-30 (×2): via INTRAVENOUS

## 2019-01-30 MED ORDER — PRAVASTATIN SODIUM 10 MG PO TABS
20.0000 mg | ORAL_TABLET | Freq: Every day | ORAL | Status: DC
  Filled 2019-01-30: qty 2

## 2019-01-30 MED ORDER — PROTAMINE SULFATE 10 MG/ML IV SOLN
INTRAVENOUS | Status: DC | PRN
  Administered 2019-01-30: 160 mg via INTRAVENOUS

## 2019-01-30 SURGICAL SUPPLY — 35 items
BAG SNAP BAND KOVER 36X36 (MISCELLANEOUS) ×6 IMPLANT
BLANKET WARM UNDERBOD FULL ACC (MISCELLANEOUS) ×3 IMPLANT
CABLE ADAPT PACING TEMP 12FT (ADAPTER) ×3 IMPLANT
CATH 26 ULTRA DELIVERY (CATHETERS) ×3 IMPLANT
CATH DIAG 6FR PIGTAIL ANGLED (CATHETERS) ×6 IMPLANT
CATH INFINITI 6F AL1 (CATHETERS) ×3 IMPLANT
CATH S G BIP PACING (CATHETERS) ×3 IMPLANT
CLOSURE MYNX CONTROL 6F/7F (Vascular Products) ×3 IMPLANT
COVER DOME SNAP 22 D (MISCELLANEOUS) ×3 IMPLANT
CRIMPER (MISCELLANEOUS) ×3 IMPLANT
DEVICE CLOSURE PERCLS PRGLD 6F (VASCULAR PRODUCTS) ×4 IMPLANT
DEVICE INFLATION ATRION QL2530 (MISCELLANEOUS) ×3 IMPLANT
ELECT DEFIB PAD ADLT CADENCE (PAD) ×3 IMPLANT
GUIDEWIRE SAFE TJ AMPLATZ EXST (WIRE) ×3 IMPLANT
KIT HEART LEFT (KITS) ×3 IMPLANT
KIT MICROPUNCTURE NIT STIFF (SHEATH) ×3 IMPLANT
PACK CARDIAC CATHETERIZATION (CUSTOM PROCEDURE TRAY) ×3 IMPLANT
PERCLOSE PROGLIDE 6F (VASCULAR PRODUCTS) ×6
SHEATH 14X36 EDWARDS (SHEATH) ×3 IMPLANT
SHEATH BRITE TIP 7FR 35CM (SHEATH) ×3 IMPLANT
SHEATH PINNACLE 6F 10CM (SHEATH) ×3 IMPLANT
SHEATH PINNACLE 8F 10CM (SHEATH) ×3 IMPLANT
SLEEVE REPOSITIONING LENGTH 30 (MISCELLANEOUS) ×3 IMPLANT
STOPCOCK MORSE 400PSI 3WAY (MISCELLANEOUS) ×9 IMPLANT
SYR MEDRAD MARK V 150ML (SYRINGE) ×3 IMPLANT
TRANSDUCER W/STOPCOCK (MISCELLANEOUS) ×6 IMPLANT
TUBE CONN 8.8X1320 FR HP M-F (CONNECTOR) ×3 IMPLANT
TUBING ART PRESS 72  MALE/FEM (TUBING) ×2
TUBING ART PRESS 72 MALE/FEM (TUBING) ×4 IMPLANT
TUBING CIL FLEX 10 FLL-RA (TUBING) ×3 IMPLANT
VALVE 26 ULTRA SAPIEN KIT (Valve) ×3 IMPLANT
WIRE AMPLATZ SS-J .035X180CM (WIRE) ×3 IMPLANT
WIRE EMERALD 3MM-J .035X150CM (WIRE) ×3 IMPLANT
WIRE EMERALD 3MM-J .035X260CM (WIRE) ×3 IMPLANT
WIRE EMERALD ST .035X260CM (WIRE) ×3 IMPLANT

## 2019-01-30 NOTE — Transfer of Care (Signed)
Immediate Anesthesia Transfer of Care Note  Patient: Kara Lee  Procedure(s) Performed: TRANSCATHETER AORTIC VALVE REPLACEMENT, TRANSFEMORAL (N/A Chest) TRANSESOPHAGEAL ECHOCARDIOGRAM (TEE) (N/A )  Patient Location: Cath Lab  Anesthesia Type:MAC  Level of Consciousness: drowsy and patient cooperative  Airway & Oxygen Therapy: Patient Spontanous Breathing and Patient connected to face mask oxygen  Post-op Assessment: Report given to RN  Post vital signs: Reviewed and stable  Last Vitals:  Vitals Value Taken Time  BP    Temp    Pulse 67 01/30/19 1212  Resp 20 01/30/19 1212  SpO2 98 % 01/30/19 1212  Vitals shown include unvalidated device data.  Last Pain:  Vitals:   01/30/19 0807  TempSrc: Oral  PainSc:       Patients Stated Pain Goal: 0 (56/81/27 5170)  Complications: No apparent anesthesia complications

## 2019-01-30 NOTE — Anesthesia Procedure Notes (Signed)
Arterial Line Insertion Start/End10/13/2020 8:50 AM, 01/30/2019 9:10 AM Performed by: Barrington Ellison, CRNA, CRNA  Patient location: Pre-op. Preanesthetic checklist: patient identified and risks and benefits discussed Lidocaine 1% used for infiltration Right, radial was placed Catheter size: 20 G Hand hygiene performed  and maximum sterile barriers used  Allen's test indicative of satisfactory collateral circulation Attempts: 2 Procedure performed without using ultrasound guided technique. Following insertion, dressing applied and Biopatch. Post procedure assessment: normal  Patient tolerated the procedure well with no immediate complications.

## 2019-01-30 NOTE — Op Note (Signed)
[facility]   MULTIDISCIPLINARY HEART VALVE TEAM   TAVR OPERATIVE NOTE   Date of Procedure:  01/30/2019  Preoperative Diagnosis: Severe Aortic Stenosis   Postoperative Diagnosis: Same   Procedure:    Transcatheter Aortic Valve Replacement - Percutaneous Right Transfemoral Approach  Edwards Sapien 3 Ultra THV (size 26 mm, model # 9750TFX, serial # [id number])   Co-Surgeons:  Valentina Gu. Roxy Manns, MD and Sherren Mocha, MD  Anesthesiologist:  Roberts Gaudy, MD  Echocardiographer:  Ena Dawley, MD  Pre-operative Echo Findings:  Severe aortic stenosis  Normal left ventricular systolic function  Post-operative Echo Findings:  No paravalvular leak  Normal left ventricular systolic function   BRIEF CLINICAL NOTE AND INDICATIONS FOR SURGERY  Patient is a morbidly obese 73 year old female with history of aortic stenosis, type 2 diabetes mellitus, GE reflux disease, and degenerative arthritis who has been referred for surgical consultation to discuss treatment options for management of severe symptomatic aortic stenosis.  Patient states that she has been told that she had a heart murmur for more than 20 years.  She denies any known history of rheumatic fever.  Several years ago she was referred to Dr. Rosalia Hammers who has been following her ever since.  Previous echocardiograms have documented the presence of moderate aortic stenosis.  She developed progressive symptoms of exertional shortness of breath and fatigue.  In 2019 she underwent transesophageal echocardiogram and diagnostic cardiac catheterization.  TEE confirmed the presence of moderate aortic stenosis with normal left ventricular systolic function.  She does not have any significant coronary artery disease.  Continued medical therapy was recommended.  Over the past year the patient has had further progression of symptoms of exertional shortness of breath and fatigue, as well as occasional dizzy spells without  syncope.  She reports occasional tightness across her chest without clear exertional angina.  Follow-up transthoracic echocardiogram was performed demonstrating preserved left ventricular systolic function but significant progression of the severity of aortic stenosis with peak velocity across aortic valve reported 4.0 m/s corresponding to mean transvalvular gradient estimated 38 mmHg.  There was mild to moderate aortic insufficiency.  The patient was referred to the multidisciplinary heart valve clinic and has been evaluated previously by Dr. Burt Knack.  CT angiography was performed and the patient referred for surgical consultation.  During the course of the patient's preoperative work up they have been evaluated comprehensively by a multidisciplinary team of specialists coordinated through the [facility] in the [facility].  They have been demonstrated to suffer from symptomatic severe aortic stenosis as noted above. The patient has been counseled extensively as to the relative risks and benefits of all options for the treatment of severe aortic stenosis including long term medical therapy, conventional surgery for aortic valve replacement, and transcatheter aortic valve replacement.  All questions have been answered, and the patient provides full informed consent for the operation as described.   DETAILS OF THE OPERATIVE PROCEDURE  PREPARATION:    The patient is brought to the operating room on the above mentioned date and appropriate monitoring was established by the anesthesia team. The patient is placed in the supine position on the operating table.  Intravenous antibiotics are administered. The patient is monitored closely throughout the procedure under conscious sedation.  Baseline transthoracic echocardiogram was performed. The patient's chest, abdomen, both groins, and both lower extremities are prepared and draped in a sterile manner. A time out  procedure is performed.   PERIPHERAL ACCESS:  Using the modified Seldinger technique, femoral arterial and venous access was obtained with placement of 6 Fr sheaths on the left side.  A pigtail diagnostic catheter was passed through the left arterial sheath under fluoroscopic guidance into the aortic root.  A temporary transvenous pacemaker catheter was passed through the left femoral venous sheath under fluoroscopic guidance into the right ventricle.  The pacemaker was tested to ensure stable lead placement and pacemaker capture. Aortic root angiography was performed in order to determine the optimal angiographic angle for valve deployment.   TRANSFEMORAL ACCESS:   Percutaneous transfemoral access and sheath placement was performed using ultrasound guidance.  The right common femoral artery was cannulated using a micropuncture needle and appropriate location was verified using hand injection angiogram.  A pair of Abbott Perclose percutaneous closure devices were placed and a 6 French sheath replaced into the femoral artery.  The patient was heparinized systemically and ACT verified > 250 seconds.    A 14 Fr transfemoral E-sheath was introduced into the right common femoral artery after progressively dilating over an Amplatz superstiff wire. An AL-1 catheter was used to direct a straight-tip exchange length wire across the native aortic valve into the left ventricle. This was exchanged out for a pigtail catheter and position was confirmed in the LV apex. Simultaneous LV and Ao pressures were recorded.  The pigtail catheter was exchanged for an Amplatz Extra-stiff wire in the LV apex.  Echocardiography was utilized to confirm appropriate wire position and no sign of entanglement in the mitral subvalvular apparatus.   TRANSCATHETER HEART VALVE DEPLOYMENT:   An Edwards Sapien 3 Ultra transcatheter heart valve (size 26 mm, model #9750TFX, serial [id number]) was prepared and crimped per manufacturer's  guidelines, and the proper orientation of the valve is confirmed on the [employer] delivery system. The valve was advanced through the introducer sheath using normal technique until in an appropriate position in the abdominal aorta beyond the sheath tip. The balloon was then retracted and using the fine-tuning wheel was centered on the valve. The valve was then advanced across the aortic arch using appropriate flexion of the catheter. The valve was carefully positioned across the aortic valve annulus. The Commander catheter was retracted using normal technique. Once final position of the valve has been confirmed by angiographic assessment, the valve is deployed while temporarily holding ventilation and during rapid ventricular pacing to maintain systolic blood pressure < 50 mmHg and pulse pressure < 10 mmHg. The balloon inflation is held for >3 seconds after reaching full deployment volume. Once the balloon has fully deflated the balloon is retracted into the ascending aorta and valve function is assessed using echocardiography. There is felt to be no paravalvular leak and no central aortic insufficiency.  The patient's hemodynamic recovery following valve deployment is good.  The deployment balloon and guidewire are both removed.    PROCEDURE COMPLETION:   The sheath was removed and femoral artery closure performed.  Protamine was administered once femoral arterial repair was complete. The temporary pacemaker, pigtail catheters and femoral sheaths were removed with manual pressure used for hemostasis.  A Mynx femoral closure device was utilized following removal of the diagnostic sheath in the left femoral artery.  The patient tolerated the procedure well and is transported to the surgical intensive care in stable condition. There were no immediate intraoperative complications. All sponge instrument and needle counts are verified correct at completion of the operation.   No blood products were  administered during the operation.  The patient  received a total of 40 mL of intravenous contrast during the procedure.   Rexene Alberts, MD 01/30/2019 11:50 AM

## 2019-01-30 NOTE — Progress Notes (Signed)
Rt radial arterial line d/c'ed. Manual pressure held x 5 minutes. Gauze and tegaderm dressing. Palpable rt radial pulse.

## 2019-01-30 NOTE — Anesthesia Postprocedure Evaluation (Signed)
Anesthesia Post Note  Patient: Kara Lee  Procedure(s) Performed: TRANSCATHETER AORTIC VALVE REPLACEMENT, TRANSFEMORAL (N/A Chest) TRANSESOPHAGEAL ECHOCARDIOGRAM (TEE) (N/A )     Patient location during evaluation: Cath Lab Anesthesia Type: MAC Level of consciousness: awake and alert Pain management: pain level controlled Vital Signs Assessment: post-procedure vital signs reviewed and stable Respiratory status: spontaneous breathing, nonlabored ventilation, respiratory function stable and patient connected to nasal cannula oxygen Cardiovascular status: stable and blood pressure returned to baseline Postop Assessment: no apparent nausea or vomiting Anesthetic complications: no    Last Vitals:  Vitals:   01/30/19 1400 01/30/19 1430  BP: (!) 94/48 (!) 102/51  Pulse: (!) 58 (!) 57  Resp: 15 11  Temp: (!) 35.8 C   SpO2: 90% 97%    Last Pain:  Vitals:   01/30/19 1400  TempSrc: Axillary  PainSc: 0-No pain                 Kara Lee

## 2019-01-30 NOTE — Progress Notes (Signed)
Pt received from PACU. CHG bath given. Telebox 27 applied/ccmd notified. Vital stable. PT denies complaints. PT oriented to room. Call bell within reach will continue to monitor. Jerald Kief

## 2019-01-30 NOTE — Progress Notes (Signed)
  Echocardiogram 2D Echocardiogram limited for TAVR has been performed.  Darlina Sicilian M 01/30/2019, 11:50 AM

## 2019-01-30 NOTE — Op Note (Signed)
[facility]   MULTIDISCIPLINARY HEART VALVE TEAM   TAVR OPERATIVE NOTE   Date of Procedure:  01/30/2019  Preoperative Diagnosis: Severe Aortic Stenosis   Postoperative Diagnosis: Same   Procedure:    Transcatheter Aortic Valve Replacement - Percutaneous Transfemoral Approach  Edwards Sapien 3 Ultra THV (size 26 mm, model # 9750TFX, serial # [id number])   Co-Surgeons:  Valentina Gu. Roxy Manns, MD and Sherren Mocha, MD  Anesthesiologist:  Roberts Gaudy, MD  Echocardiographer:  Ena Dawley, MD  Pre-operative Echo Findings:  Severe aortic stenosis  Normal left ventricular systolic function  Post-operative Echo Findings:  No paravalvular leak  Normal/unchanged left ventricular systolic function  BRIEF CLINICAL NOTE AND INDICATIONS FOR SURGERY  Please see the complete note of Dr Roxy Manns for details.   During the course of the patient's preoperative work up they have been evaluated comprehensively by a multidisciplinary team of specialists coordinated through the [facility] in the [facility].  They have been demonstrated to suffer from symptomatic severe aortic stenosis as noted above. The patient has been counseled extensively as to the relative risks and benefits of all options for the treatment of severe aortic stenosis including long term medical therapy, conventional surgery for aortic valve replacement, and transcatheter aortic valve replacement.  The patient has been independently evaluated in formal cardiac surgical consultation by Dr Roxy Manns, who deemed the patient appropriate for TAVR. Based upon review of all of the patient's preoperative diagnostic tests they are felt to be candidate for transcatheter aortic valve replacement using the transfemoral approach as an alternative to conventional surgery.    Following the decision to proceed with transcatheter aortic valve replacement, a discussion has been held  regarding what types of management strategies would be attempted intraoperatively in the event of life-threatening complications, including whether or not the patient would be considered a candidate for the use of cardiopulmonary bypass and/or conversion to open sternotomy for attempted surgical intervention.  The patient has been advised of a variety of complications that might develop peculiar to this approach including but not limited to risks of death, stroke, paravalvular leak, aortic dissection or other major vascular complications, aortic annulus rupture, device embolization, cardiac rupture or perforation, acute myocardial infarction, arrhythmia, heart block or bradycardia requiring permanent pacemaker placement, congestive heart failure, respiratory failure, renal failure, pneumonia, infection, other late complications related to structural valve deterioration or migration, or other complications that might ultimately cause a temporary or permanent loss of functional independence or other long term morbidity.  The patient provides full informed consent for the procedure as described and all questions were answered preoperatively.  DETAILS OF THE OPERATIVE PROCEDURE  PREPARATION:   The patient is brought to the operating room on the above mentioned date and central monitoring was established by the anesthesia team including placement of a central venous catheter and radial arterial line. The patient is placed in the supine position on the operating table.  Intravenous antibiotics are administered. The patient is monitored closely throughout the procedure under conscious sedation.  Baseline transthoracic echocardiogram is performed. The patient's chest, abdomen, both groins, and both lower extremities are prepared and draped in a sterile manner. A time out procedure is performed.   PERIPHERAL ACCESS:   Using ultrasound guidance, femoral arterial and venous access is obtained with placement of 6 Fr  sheaths on the left side.  A pigtail diagnostic catheter was passed through the femoral arterial sheath under fluoroscopic guidance into the aortic root.  A temporary transvenous pacemaker catheter was passed through the femoral venous sheath under fluoroscopic guidance into the right ventricle.  The pacemaker was tested to ensure stable lead placement and pacemaker capture. Aortic root angiography was performed in order to determine the optimal angiographic angle for valve deployment.  TRANSFEMORAL ACCESS:  A micropuncture technique is used to access the right femoral artery under fluoroscopic and ultrasound guidance.  2 Perclose devices are deployed at 10' and 2' positions to 'PreClose' the femoral artery. An 8 French sheath is placed and then an Amplatz Superstiff wire is advanced through the sheath. This is changed out for a 14 French transfemoral E-Sheath after progressively dilating over the Superstiff wire.  An AL-1 catheter was used to direct a straight-tip exchange length wire across the native aortic valve into the left ventricle. This was exchanged out for a pigtail catheter and position was confirmed in the LV apex. Simultaneous LV and Ao pressures were recorded.  The pigtail catheter was exchanged for an Amplatz Extra-stiff wire in the LV apex.    BALLOON AORTIC VALVULOPLASTY:  Not performed  TRANSCATHETER HEART VALVE DEPLOYMENT:  An Edwards Sapien 3 transcatheter heart valve (size 26 mm) was prepared and crimped per manufacturer's guidelines, and the proper orientation of the valve is confirmed on the [employer] delivery system. The valve was advanced through the introducer sheath using normal technique until in an appropriate position in the abdominal aorta beyond the sheath tip. The balloon was then retracted and using the fine-tuning wheel was centered on the valve. The valve was then advanced across the aortic arch using appropriate flexion of the catheter. The valve was carefully  positioned across the aortic valve annulus. The Commander catheter was retracted using normal technique. Once final position of the valve has been confirmed by angiographic assessment, the valve is deployed while temporarily holding ventilation and during rapid ventricular pacing to maintain systolic blood pressure < 50 mmHg and pulse pressure < 10 mmHg. The balloon inflation is held for >3 seconds after reaching full deployment volume. Once the balloon has fully deflated the balloon is retracted into the ascending aorta and valve function is assessed using echocardiography. The patient's hemodynamic recovery following valve deployment is good.  The deployment balloon and guidewire are both removed. Echo demostrated acceptable post-procedural gradients, stable mitral valve function, and no aortic insufficiency.   PROCEDURE COMPLETION:  The sheath was removed and femoral artery closure is performed using the 2 previously deployed Perclose devices.  Protamine is administered once femoral arterial repair was complete. The site is clear with no evidence of bleeding or hematoma after the sutures are tightened. The temporary pacemaker and pigtail catheters are removed. Mynx closure The patient tolerated the procedure well and is transported to the surgical intensive care in stable condition. There were no immediate intraoperative complications. All sponge instrument and needle counts are verified correct at completion of the operation.   The patient received a total of 40 mL of intravenous contrast during the procedure.   Sherren Mocha, MD 01/30/2019 12:37 PM

## 2019-01-30 NOTE — Progress Notes (Signed)
  [place] VALVE TEAM  Patient doing well s/p TAVR. She is hemodynamically stable. Groin sites stable. ECG with no high grade block. Arterial line discontinued and plan to transfer to 4E. Plan for early ambulation after bedrest completed and hopeful discharge over the next 24-48 hours.   [name] PA-C  [facility]  Pager [phone]

## 2019-01-30 NOTE — Interval H&P Note (Signed)
History and Physical Interval Note:  01/30/2019 10:03 AM  Lenyx H Roes  has presented today for surgery, with the diagnosis of Severe Aortic Stenosis.  The various methods of treatment have been discussed with the patient and family. After consideration of risks, benefits and other options for treatment, the patient has consented to  Procedure(s): TRANSCATHETER AORTIC VALVE REPLACEMENT, TRANSFEMORAL (N/A) TRANSESOPHAGEAL ECHOCARDIOGRAM (TEE) (N/A) as a surgical intervention.  The patient's history has been reviewed, patient examined, no change in status, stable for surgery.  I have reviewed the patient's chart and labs.  Questions were answered to the patient's satisfaction.     Sherren Mocha

## 2019-01-31 ENCOUNTER — Inpatient Hospital Stay (HOSPITAL_COMMUNITY): Payer: Medicare Other

## 2019-01-31 DIAGNOSIS — Z952 Presence of prosthetic heart valve: Secondary | ICD-10-CM

## 2019-01-31 LAB — CBC
HCT: 33.3 % — ABNORMAL LOW (ref 36.0–46.0)
Hemoglobin: 10.8 g/dL — ABNORMAL LOW (ref 12.0–15.0)
MCH: 29.8 pg (ref 26.0–34.0)
MCHC: 32.4 g/dL (ref 30.0–36.0)
MCV: 92 fL (ref 80.0–100.0)
Platelets: 176 10*3/uL (ref 150–400)
RBC: 3.62 MIL/uL — ABNORMAL LOW (ref 3.87–5.11)
RDW: 13.1 % (ref 11.5–15.5)
WBC: 9.4 10*3/uL (ref 4.0–10.5)
nRBC: 0 % (ref 0.0–0.2)

## 2019-01-31 LAB — BASIC METABOLIC PANEL
Anion gap: 11 (ref 5–15)
BUN: 9 mg/dL (ref 8–23)
CO2: 22 mmol/L (ref 22–32)
Calcium: 8.8 mg/dL — ABNORMAL LOW (ref 8.9–10.3)
Chloride: 105 mmol/L (ref 98–111)
Creatinine, Ser: 0.81 mg/dL (ref 0.44–1.00)
GFR calc Af Amer: >60 mL/min (ref >60)
GFR calc non Af Amer: >60 mL/min (ref >60)
Glucose, Bld: 142 mg/dL — ABNORMAL HIGH (ref 70–99)
Potassium: 3.7 mmol/L (ref 3.5–5.1)
Sodium: 138 mmol/L (ref 135–145)

## 2019-01-31 LAB — GLUCOSE, CAPILLARY
Glucose-Capillary: 138 mg/dL — ABNORMAL HIGH (ref 70–99)
Glucose-Capillary: 173 mg/dL — ABNORMAL HIGH (ref 70–99)

## 2019-01-31 LAB — MAGNESIUM: Magnesium: 1.8 mg/dL (ref 1.7–2.4)

## 2019-01-31 MED ORDER — PANTOPRAZOLE SODIUM 40 MG PO TBEC: 40.0000 mg | DELAYED_RELEASE_TABLET | Freq: Every day | ORAL | 1 refills | Status: DC

## 2019-01-31 MED ORDER — CLOPIDOGREL BISULFATE 75 MG PO TABS: 75.0000 mg | ORAL_TABLET | Freq: Every day | ORAL | 1 refills | Status: DC

## 2019-01-31 NOTE — Progress Notes (Signed)
Echocardiogram 2D Echocardiogram has been performed.  Oneal Deputy Kaylub Detienne 01/31/2019, 9:57 AM

## 2019-01-31 NOTE — Plan of Care (Signed)
All goals met and adequate for discharge.

## 2019-01-31 NOTE — Progress Notes (Signed)
CARDIAC REHAB PHASE I   PRE:  Rate/Rhythm: 74 SR  BP:  Sitting: 119/53      SaO2: 97 RA  MODE:  Ambulation: 470 ft   POST:  Rate/Rhythm: 105 ST  BP:  Sitting: 135/74    SaO2: 98 RA  Pt ambulated 452ft in hallway independently with steady gait. Pt denies pain or SOB. Pt states she feels like she can take a deep breath again. Pt given heart healthy and diabetic diet. Reviewed restrictions, site care, and exercise guidelines. Will refer to CRP II [place], pt interested in Virtual Cardiac Rehab.   Pt is interested in participating in Virtual Cardiac and Pulmonary Rehab. Pt advised that Virtual Cardiac and Pulmonary Rehab is provided at no cost to the patient.  Checklist:  1. Pt has smart device  ie smartphone and/or ipad for downloading an app  Yes 2. Reliable internet/wifi service    Yes 3. Understands how to use their smartphone and navigate within an app.  Yes  Pt verbalized understanding and is in agreement.  [id number] Rufina Falco, RN BSN 01/31/2019 9:21 AM

## 2019-01-31 NOTE — Discharge Summary (Addendum)
[facility] VALVE TEAM  Discharge Summary    Patient ID: Kara Lee MRN: [id number]; DOB: February 01, 1946  Admit date: 01/30/2019 Discharge date: 01/31/2019  Primary Care Provider: Reita Cliche, MD  Primary Cardiologist: Dr. Einar Gip / Dr. Burt Knack and Dr. Roxy Manns (TAVR)  Discharge Diagnoses    Principal Problem:   S/P TAVR (transcatheter aortic valve replacement) Active Problems:   Severe aortic stenosis   OSA on CPAP   Morbid obesity (HCC)   Diabetes mellitus (HCC)   GERD (gastroesophageal reflux disease)   HLD (hyperlipidemia)   Allergies Allergies  Allergen Reactions   Bee Venom Itching and Swelling   Latex Itching and Swelling   Lovenox [Enoxaparin Sodium] Other (See Comments)    Bruising   Minocycline Nausea And Vomiting   Morphine And Related Nausea And Vomiting   Nickel Other (See Comments)    Contact dermatitis    Shellfish Allergy Itching   Statins     Muscle aches   Sulfa Antibiotics Nausea And Vomiting   Tape Itching    Bandaids and adhesive tape   Glimepiride Rash    Diagnostic Studies/Procedures    TAVR OPERATIVE NOTE   Date of Procedure:                01/30/2019  Preoperative Diagnosis:      Severe Aortic Stenosis   Postoperative Diagnosis:    Same   Procedure:        Transcatheter Aortic Valve Replacement - Percutaneous Right Transfemoral Approach             Edwards Sapien 3 Ultra THV (size 26 mm, model # 9750TFX, serial # [id number])              Co-Surgeons:                        Valentina Gu. Roxy Manns, MD and Sherren Mocha, MD  Anesthesiologist:                  Roberts Gaudy, MD  Echocardiographer:              Ena Dawley, MD  Pre-operative Echo Findings: ? Severe aortic stenosis ? Normal left ventricular systolic function  Post-operative Echo Findings: ? No paravalvular leak ? Normal left ventricular systolic function _____________   Echo 01/31/19: completed  but pending formal read at the time of discharge    History of Present Illness     Kara Lee is a 73 y.o. female with a history of OSA on CPAP, HLD, DMT2, morbid obesity (BMI >40), and severe AS who presented to [facility] on 01/30/19 for planned TAVR.   Patient states that she has been told that she had a heart murmur for more than 20 years. She denies any known history of rheumatic fever. Several years ago she was referred to Dr. Einar Gip who has been following her ever since. Previous echocardiograms have documented the presence of moderate aortic stenosis. She developed progressive symptoms of exertional shortness of breath and fatigue. In 2019 she underwent TEE and diagnostic cardiac catheterization. TEE confirmed the presence of moderate aortic stenosis with normal left ventricular systolic function.  She does not have any significant coronary artery disease. Continued medical therapy was recommended. Over the past year the patient has had further progression of symptoms of exertional shortness of breath and fatigue, as well as occasional dizzy spells without syncope. She reports occasional tightness across her chest without  clear exertional angina. Follow-up transthoracic echocardiogram was performed demonstrating preserved left ventricular systolic function but significant progression of the severity of aortic stenosis with peak velocity across aortic valve reported 4.0 m/s corresponding to mean transvalvular gradient estimated 38 mmHg. There was mild to moderate aortic insufficiency.   The patient has been evaluated by the multidisciplinary valve team and felt to have severe, symptomatic aortic stenosis and to be a suitable candidate for TAVR, which was set up for 01/30/19.    Hospital Course     Consultants: none   Severe AS: s/p successful TAVR with a 26 mm Edwards Sapien 3 Ultra THV via the TF approach on 01/30/19. Post operative echo completed but pending formal read. Groin sites are stable.  ECG with sinus, LVH and repol abnormality but no high grade heart block. Tele with sinus. Continue Asprin and started on Plavix 75 mg daily. Plan discharge home today with close outpatient follow-up next week.  HTN: BP well controlled.  Resume home regimen.  GERD: home Nexium was switched to Protonix given potential drug drug interaction with Nexium and clopidogrel.  DMT2: resume home regimen.  Hold Metformin for at least 48 hours after contrast dye exposure during TAVR.  Okay to resume on 02/02/2019  Morbid obesity: Body mass index is 44.23 kg/m. She is interested in loosing weight. She was asking questions about outpatient PT. I have recommended she participate in cardiac rehab.   _____________  Discharge Vitals Blood pressure (!) 119/53, pulse 77, temperature 98.8 F (37.1 C), temperature source Oral, resp. rate 18, height 5\' 2"  (1.575 m), weight 109.7 kg, SpO2 98 %.  Filed Weights   01/30/19 0801 01/30/19 1325 01/31/19 0445  Weight: 110.5 kg 110.5 kg 109.7 kg     GEN: Well nourished, well developed, in no acute distress, morbidly obese HEENT: normal Neck: no JVD or masses Cardiac: RRR; soft flow murmur. No rubs, or gallops,no edema  Respiratory:  clear to auscultation bilaterally, normal work of breathing GI: soft, nontender, nondistended, + BS MS: no deformity or atrophy Skin: warm and dry, no rash.  Groin sites clear without hematoma or ecchymosis  Neuro:  Alert and Oriented x 3, Strength and sensation are intact Psych: euthymic mood, full affect  Labs & Radiologic Studies    CBC Recent Labs    01/30/19 1219 01/31/19 0523  WBC  --  9.4  HGB 12.2 10.8*  HCT 36.0 33.3*  MCV  --  92.0  PLT  --  0000000   Basic Metabolic Panel Recent Labs    01/30/19 1219 01/31/19 0523  NA 141 138  K 4.0 3.7  CL 108 105  CO2  --  22  GLUCOSE 190* 142*  BUN 11 9  CREATININE 0.40* 0.81  CALCIUM  --  8.8*  MG  --  1.8   Liver Function Tests No results for input(s): AST, ALT,  ALKPHOS, BILITOT, PROT, ALBUMIN in the last 72 hours. No results for input(s): LIPASE, AMYLASE in the last 72 hours. Cardiac Enzymes No results for input(s): CKTOTAL, CKMB, CKMBINDEX, TROPONINI in the last 72 hours. BNP Invalid input(s): POCBNP D-Dimer No results for input(s): DDIMER in the last 72 hours. Hemoglobin A1C No results for input(s): HGBA1C in the last 72 hours. Fasting Lipid Panel No results for input(s): CHOL, HDL, LDLCALC, TRIG, CHOLHDL, LDLDIRECT in the last 72 hours. Thyroid Function Tests No results for input(s): TSH, T4TOTAL, T3FREE, THYROIDAB in the last 72 hours.  Invalid input(s): FREET3 _____________  Dg Chest 2 View  Result Date: 01/26/2019 CLINICAL DATA:  Bicuspid aortic valve, aortic stenosis. EXAM: CHEST - 2 VIEW COMPARISON:  CT 01/17/2019 FINDINGS: The heart size is upper limits of normal. Both lungs are clear. Degenerative changes of the thoracic spine. IMPRESSION: No active cardiopulmonary disease. Electronically Signed   By: Davina Poke M.D.   On: 01/26/2019 15:40   Ct Coronary Morph W/cta Cor W/score W/ca W/cm &/or Wo/cm  Addendum Date: 01/17/2019   ADDENDUM REPORT: 01/17/2019 12:12 CLINICAL DATA:  Aortic stenosis EXAM: Cardiac TAVR CT TECHNIQUE: The patient was scanned on a Siemens Force [id number] slice scanner. A 120 kV retrospective scan was triggered in the descending thoracic aorta at 111 HU's. Gantry rotation speed was 270 msecs and collimation was .9 mm. No beta blockade or nitro were given. The 3D data set was reconstructed in 5% intervals of the R-R cycle. Systolic and diastolic phases were analyzed on a dedicated work station using MPR, MIP and VRT modes. The patient received 80 cc of contrast. FINDINGS: Aortic Valve: Calcified tri leaflet with restricted leaflet motion Aorta: Moderate calcific atherosclerosis with no aneurysm and normal arch vessels Sinotubular Junction: 26 mm Ascending Thoracic Aorta: 29 mm Aortic Arch: 24 mm Descending Thoracic Aorta:  24 mm Sinus of Valsalva Measurements: Non-coronary: 25 mm Right - coronary: 27 mm Left - coronary: 27.5 mm Coronary Artery Height above Annulus: Left Main: 11 mm above annulus Right Coronary: 14.8 mm above annulus Virtual Basal Annulus Measurements: Maximum/Minimum Diameter: 27.8 mm x 19.7 mm Perimeter: 77 mm Area: 425 mm2 Coronary Arteries: Sufficient height above annulus for deployment Optimum Fluoroscopic Angle for Delivery: LAO 13 Caudal 14 degrees IMPRESSION: 1. Tri leaflet AV with annular area of 425 mm2 suitable for a 23 mm Sapien 3 Ultra valve 2.  Coronary arteries sufficient height above annulus for deployment 3.  Normal aortic root 2.9 cm 4. Optimum angiographic anlge for deployment LAO 14 Caudal 14 degrees [place] Electronically Signed   By: Jenkins Rouge M.D.   On: 01/17/2019 12:12   Result Date: 01/17/2019 EXAM: OVER-READ INTERPRETATION  CT CHEST The following report is an over-read performed by radiologist Dr. Vinnie Langton of [facility], [place] on 01/17/2019. This over-read does not include interpretation of cardiac or coronary anatomy or pathology. The coronary calcium score/coronary CTA interpretation by the cardiologist is attached. COMPARISON:  None. FINDINGS: Extracardiac findings will be described separately under dictation for contemporaneously obtained CTA chest, abdomen and pelvis. IMPRESSION: Please see separate dictation for contemporaneously obtained CTA chest, abdomen and pelvis dated 01/17/2019 for full description of relevant extracardiac findings. Electronically Signed: By: Vinnie Langton M.D. On: 01/17/2019 11:16   Dg Chest Port 1 View  Result Date: 01/30/2019 CLINICAL DATA:  Postop trans arterial aortic valve replacement. EXAM: PORTABLE CHEST 1 VIEW COMPARISON:  01/26/2019 FINDINGS: Heart size remains enlarged now status post TAVR. Valve projects over expected location of aortic valve. No signs of edema, consolidation or effusion. No acute bone finding.  IMPRESSION: Signs of TAVR without signs of edema or consolidation. Electronically Signed   By: Zetta Bills M.D.   On: 01/30/2019 14:58   Ct Angio Chest Aorta W &/or Wo Contrast  Result Date: 01/17/2019 CLINICAL DATA:  73 year old female with history of severe aortic stenosis. Preprocedural study prior to potential transcatheter aortic valve replacement (TAVR) procedure. EXAM: CT ANGIOGRAPHY CHEST, ABDOMEN AND PELVIS TECHNIQUE: Multidetector CT imaging through the chest, abdomen and pelvis was performed using the standard protocol during bolus administration of intravenous contrast. Multiplanar reconstructed images and MIPs  were obtained and reviewed to evaluate the vascular anatomy. CONTRAST:  173mL OMNIPAQUE IOHEXOL 350 MG/ML SOLN COMPARISON:  None. FINDINGS: CTA CHEST FINDINGS Cardiovascular: Heart size is mildly enlarged. There is no significant pericardial fluid, thickening or pericardial calcification. There is aortic atherosclerosis, as well as atherosclerosis of the great vessels of the mediastinum and the coronary arteries, including calcified atherosclerotic plaque in the left anterior descending and right coronary arteries. Severe thickening calcification of the aortic valve. Severe calcifications of the mitral annulus. Dilatation of the pulmonic trunk (3.5 cm in diameter). Mediastinum/Lymph Nodes: No pathologically enlarged mediastinal or hilar lymph nodes. Esophagus is unremarkable in appearance. No axillary lymphadenopathy. Lungs/Pleura: Patchy areas of ground-glass attenuation and interlobular septal thickening noted in the lungs bilaterally, which suggest a background of very mild interstitial pulmonary edema. No confluent consolidative airspace disease. No pleural effusions. No suspicious appearing pulmonary nodules or masses are noted. Musculoskeletal/Soft Tissues: There are no aggressive appearing lytic or blastic lesions noted in the visualized portions of the skeleton. CTA ABDOMEN AND  PELVIS FINDINGS Hepatobiliary: No suspicious cystic or solid hepatic lesions. No intra or extrahepatic biliary ductal dilatation. Status post cholecystectomy. Pancreas: No pancreatic mass. No pancreatic ductal dilatation. No pancreatic or peripancreatic fluid collections or inflammatory changes. Spleen: Unremarkable. Adrenals/Urinary Tract: Low-attenuation nonenhancing lesions in both kidneys, largest of which is in the medial aspect of the interpolar region of the right kidney measuring 2.5 cm in diameter, compatible with simple cysts. No suspicious renal lesions. Bilateral adrenal glands are normal in appearance. No hydroureteronephrosis. Urinary bladder is nearly decompressed, but otherwise unremarkable in appearance. Stomach/Bowel: Normal appearance of the stomach. No pathologic dilatation of small bowel or colon. A few scattered colonic diverticulae are noted, particularly in the sigmoid colon, without surrounding inflammatory changes to suggest an acute diverticulitis at this time. Normal appendix. Vascular/Lymphatic: Aortic atherosclerosis, without evidence of aneurysm or dissection in the abdominal or pelvic vasculature. Vascular findings and measurements pertinent to potential TAVR procedure, as detailed below. No lymphadenopathy noted in the abdomen or pelvis. Reproductive: Uterus and ovaries are unremarkable in appearance. Other: No significant volume of ascites.  No pneumoperitoneum. Musculoskeletal: There are no aggressive appearing lytic or blastic lesions noted in the visualized portions of the skeleton. VASCULAR MEASUREMENTS PERTINENT TO TAVR: AORTA: Minimal Aortic Diameter- 14 x 15 mm Severity of Aortic Calcification-mild RIGHT PELVIS: Right Common Iliac Artery - Minimal Diameter-9.7 x 9.8 mm Tortuosity-mild Calcification-none Right External Iliac Artery - Minimal Diameter-7.3 x 7.4 mm Tortuosity-moderate Calcification-none Right Common Femoral Artery - Minimal Diameter-7.5 x 8.2 mm Tortuosity-mild  Calcification-mild LEFT PELVIS: Left Common Iliac Artery - Minimal Diameter-9.9 x 10.1 mm Tortuosity-mild Calcification-mild Left External Iliac Artery - Minimal Diameter-7.4 x 6.7 mm Tortuosity-moderate to severe Calcification-none Left Common Femoral Artery - Minimal Diameter-7.1 x 7.6 mm Tortuosity-mild Calcification-mild Review of the MIP images confirms the above findings. IMPRESSION: 1. Vascular findings and measurements pertinent to potential TAVR procedure, as detailed above. 2. Severe thickening and calcification of the aortic valve, compatible with the reported clinical history of severe aortic stenosis. 3. Mild cardiomegaly with evidence of very mild interstitial pulmonary edema in the lungs, suggesting mild congestive heart failure. 4. Dilatation of the pulmonic trunk, concerning for pulmonary arterial hypertension. 5. Aortic atherosclerosis, in addition to 2 vessel coronary artery disease. 6. Mild colonic diverticulosis without evidence of acute diverticulitis at this time. 7. Additional incidental findings, as above. Electronically Signed   By: Vinnie Langton M.D.   On: 01/17/2019 12:05   Vas US Carotid  Result  Date: 01/17/2019 Carotid Arterial Duplex Study Indications:       Pre TAVR. Risk Factors:      Diabetes. Comparison Study:  no prior Performing Technologist: June Leap RDMS, RVT  Examination Guidelines: A complete evaluation includes B-mode imaging, spectral Doppler, color Doppler, and power Doppler as needed of all accessible portions of each vessel. Bilateral testing is considered an integral part of a complete examination. Limited examinations for reoccurring indications may be performed as noted.  Right Carotid Findings: +----------+--------+--------+--------+------------------+--------+             PSV cm/s EDV cm/s Stenosis Plaque Description Comments  +----------+--------+--------+--------+------------------+--------+  CCA Prox   74       18                                              +----------+--------+--------+--------+------------------+--------+  CCA Distal 61       16                                             +----------+--------+--------+--------+------------------+--------+  ICA Prox   61       19       Normal                                +----------+--------+--------+--------+------------------+--------+  ICA Distal 52       17                                             +----------+--------+--------+--------+------------------+--------+  ECA        67       7                                              +----------+--------+--------+--------+------------------+--------+ +----------+--------+-------+----------------+-------------------+             PSV cm/s EDV cms Describe         Arm Pressure (mmHG)  +----------+--------+-------+----------------+-------------------+  Subclavian 81               Multiphasic, WNL                      +----------+--------+-------+----------------+-------------------+ +---------+--------+--+--------+--+---------+  Vertebral PSV cm/s 45 EDV cm/s 13 Antegrade  +---------+--------+--+--------+--+---------+  Left Carotid Findings: +----------+--------+--------+--------+------------------+--------+             PSV cm/s EDV cm/s Stenosis Plaque Description Comments  +----------+--------+--------+--------+------------------+--------+  CCA Prox   91       14                                             +----------+--------+--------+--------+------------------+--------+  CCA Distal 69       16                                             +----------+--------+--------+--------+------------------+--------+  ICA Prox   67       15       Normal                                +----------+--------+--------+--------+------------------+--------+  ICA Distal 72       22                                             +----------+--------+--------+--------+------------------+--------+  ECA        68       12                                              +----------+--------+--------+--------+------------------+--------+ +----------+--------+--------+----------------+-------------------+             PSV cm/s EDV cm/s Describe         Arm Pressure (mmHG)  +----------+--------+--------+----------------+-------------------+  Subclavian 101               Multiphasic, WNL                      +----------+--------+--------+----------------+-------------------+ +---------+--------+--+--------+--+---------+  Vertebral PSV cm/s 69 EDV cm/s 17 Antegrade  +---------+--------+--+--------+--+---------+  Summary: Right Carotid: The extracranial vessels were near-normal with only minimal wall                thickening or plaque. Left Carotid: The extracranial vessels were near-normal with only minimal wall               thickening or plaque.  *See table(s) above for measurements and observations.  Electronically signed by Harold Barban MD on 01/17/2019 at 4:07:51 PM.    Final    Ct Angio Abdomen Pelvis  W &/or Wo Contrast  Result Date: 01/17/2019 CLINICAL DATA:  73 year old female with history of severe aortic stenosis. Preprocedural study prior to potential transcatheter aortic valve replacement (TAVR) procedure. EXAM: CT ANGIOGRAPHY CHEST, ABDOMEN AND PELVIS TECHNIQUE: Multidetector CT imaging through the chest, abdomen and pelvis was performed using the standard protocol during bolus administration of intravenous contrast. Multiplanar reconstructed images and MIPs were obtained and reviewed to evaluate the vascular anatomy. CONTRAST:  160mL OMNIPAQUE IOHEXOL 350 MG/ML SOLN COMPARISON:  None. FINDINGS: CTA CHEST FINDINGS Cardiovascular: Heart size is mildly enlarged. There is no significant pericardial fluid, thickening or pericardial calcification. There is aortic atherosclerosis, as well as atherosclerosis of the great vessels of the mediastinum and the coronary arteries, including calcified atherosclerotic plaque in the left anterior descending and right coronary arteries.  Severe thickening calcification of the aortic valve. Severe calcifications of the mitral annulus. Dilatation of the pulmonic trunk (3.5 cm in diameter). Mediastinum/Lymph Nodes: No pathologically enlarged mediastinal or hilar lymph nodes. Esophagus is unremarkable in appearance. No axillary lymphadenopathy. Lungs/Pleura: Patchy areas of ground-glass attenuation and interlobular septal thickening noted in the lungs bilaterally, which suggest a background of very mild interstitial pulmonary edema. No confluent consolidative airspace disease. No pleural effusions. No suspicious appearing pulmonary nodules or masses are noted. Musculoskeletal/Soft Tissues: There are no aggressive appearing lytic or blastic lesions noted in the visualized portions of the skeleton. CTA ABDOMEN AND PELVIS FINDINGS Hepatobiliary: No suspicious cystic or solid hepatic lesions. No intra or extrahepatic  biliary ductal dilatation. Status post cholecystectomy. Pancreas: No pancreatic mass. No pancreatic ductal dilatation. No pancreatic or peripancreatic fluid collections or inflammatory changes. Spleen: Unremarkable. Adrenals/Urinary Tract: Low-attenuation nonenhancing lesions in both kidneys, largest of which is in the medial aspect of the interpolar region of the right kidney measuring 2.5 cm in diameter, compatible with simple cysts. No suspicious renal lesions. Bilateral adrenal glands are normal in appearance. No hydroureteronephrosis. Urinary bladder is nearly decompressed, but otherwise unremarkable in appearance. Stomach/Bowel: Normal appearance of the stomach. No pathologic dilatation of small bowel or colon. A few scattered colonic diverticulae are noted, particularly in the sigmoid colon, without surrounding inflammatory changes to suggest an acute diverticulitis at this time. Normal appendix. Vascular/Lymphatic: Aortic atherosclerosis, without evidence of aneurysm or dissection in the abdominal or pelvic vasculature. Vascular  findings and measurements pertinent to potential TAVR procedure, as detailed below. No lymphadenopathy noted in the abdomen or pelvis. Reproductive: Uterus and ovaries are unremarkable in appearance. Other: No significant volume of ascites.  No pneumoperitoneum. Musculoskeletal: There are no aggressive appearing lytic or blastic lesions noted in the visualized portions of the skeleton. VASCULAR MEASUREMENTS PERTINENT TO TAVR: AORTA: Minimal Aortic Diameter- 14 x 15 mm Severity of Aortic Calcification-mild RIGHT PELVIS: Right Common Iliac Artery - Minimal Diameter-9.7 x 9.8 mm Tortuosity-mild Calcification-none Right External Iliac Artery - Minimal Diameter-7.3 x 7.4 mm Tortuosity-moderate Calcification-none Right Common Femoral Artery - Minimal Diameter-7.5 x 8.2 mm Tortuosity-mild Calcification-mild LEFT PELVIS: Left Common Iliac Artery - Minimal Diameter-9.9 x 10.1 mm Tortuosity-mild Calcification-mild Left External Iliac Artery - Minimal Diameter-7.4 x 6.7 mm Tortuosity-moderate to severe Calcification-none Left Common Femoral Artery - Minimal Diameter-7.1 x 7.6 mm Tortuosity-mild Calcification-mild Review of the MIP images confirms the above findings. IMPRESSION: 1. Vascular findings and measurements pertinent to potential TAVR procedure, as detailed above. 2. Severe thickening and calcification of the aortic valve, compatible with the reported clinical history of severe aortic stenosis. 3. Mild cardiomegaly with evidence of very mild interstitial pulmonary edema in the lungs, suggesting mild congestive heart failure. 4. Dilatation of the pulmonic trunk, concerning for pulmonary arterial hypertension. 5. Aortic atherosclerosis, in addition to 2 vessel coronary artery disease. 6. Mild colonic diverticulosis without evidence of acute diverticulitis at this time. 7. Additional incidental findings, as above. Electronically Signed   By: Vinnie Langton M.D.   On: 01/17/2019 12:05   Disposition   Pt is being  discharged home today in good condition.  Follow-up Plans & Appointments    Follow-up Information    Eileen Stanford, PA-C. Go on 02/07/2019.   Specialties: Cardiology, Radiology Why: @ 2pm, please arrive at least 10 minutes early Contact information: [place] [id number] [phone]          Discharge Instructions    Amb Referral to Cardiac Rehabilitation   Complete by: As directed    Diagnosis: Valve Replacement   Valve: Aortic Comment - TAVR   After initial evaluation and assessments completed: Virtual Based Care may be provided alone or in conjunction with Phase 2 Cardiac Rehab based on patient barriers.: Yes      Discharge Medications   Allergies as of 01/31/2019      Reactions   Bee Venom Itching, Swelling   Latex Itching, Swelling   Lovenox [enoxaparin Sodium] Other (See Comments)   Bruising   Minocycline Nausea And Vomiting   Morphine And Related Nausea And Vomiting   Nickel Other (See Comments)   Contact dermatitis    Shellfish Allergy  Itching   Statins    Muscle aches   Sulfa Antibiotics Nausea And Vomiting   Tape Itching   Bandaids and adhesive tape   Glimepiride Rash      Medication List    STOP taking these medications   esomeprazole 40 MG capsule Commonly known as: NEXIUM Replaced by: pantoprazole 40 MG tablet     TAKE these medications   acetaminophen 650 MG CR tablet Commonly known as: TYLENOL Take 1,300 mg by mouth every 8 (eight) hours as needed for pain.   amoxicillin 500 MG capsule Commonly known as: AMOXIL Take 2,000 mg by mouth See admin instructions. Take 2000 mg 1 hour prior to dental work   aspirin EC 81 MG tablet Take 81 mg by mouth at bedtime.   atenolol 25 MG tablet Commonly known as: TENORMIN TAKE 1 TABLET BY MOUTH TWICE DAILY   clonazePAM 0.5 MG tablet Commonly known as: KLONOPIN Take 0.5 mg by mouth 2 (two) times daily.   clopidogrel 75 MG tablet Commonly known as:  PLAVIX Take 1 tablet (75 mg total) by mouth daily with breakfast. Start taking on: February 01, 2019   estradiol 0.1 MG/GM vaginal cream Commonly known as: ESTRACE Place 1 Applicatorful vaginally as needed (irritation).   fexofenadine 180 MG tablet Commonly known as: ALLEGRA Take 90 mg by mouth daily as needed for allergies or rhinitis.   FLUoxetine 40 MG capsule Commonly known as: PROZAC Take 40 mg by mouth daily.   fluticasone 50 MCG/ACT nasal spray Commonly known as: FLONASE Place 1 spray into both nostrils daily as needed for allergies.   hydrocortisone 2.5 % rectal cream Commonly known as: ANUSOL-HC Place 1 application rectally daily as needed for hemorrhoids.   Livalo 2 MG Tabs Generic drug: Pitavastatin Calcium TAKE 1 TABLET BY MOUTH DAILY What changed:   how much to take  when to take this   metFORMIN 500 MG 24 hr tablet Commonly known as: GLUCOPHAGE-XR Take 1 tablet (500 mg total) by mouth 2 (two) times daily. 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  Okay to resume on 02/02/2019   pantoprazole 40 MG tablet Commonly known as: PROTONIX Take 1 tablet (40 mg total) by mouth daily. Start taking on: February 01, 2019 Replaces: esomeprazole 40 MG capsule   Systane Balance 0.6 % Soln Generic drug: Propylene Glycol Place 1 drop into both eyes daily as needed (dry eyes).   VITAMIN C PO Take 1 tablet by mouth daily as needed (immune support).         Outstanding Labs/Studies   none  Duration of Discharge Encounter   Greater than 30 minutes including physician time.  SignedAngelena Form, PA-C 01/31/2019, 10:45 AM [phone]

## 2019-01-31 NOTE — Discharge Instructions (Signed)
Given a potential drug drug interaction with Plavix (clopidogrel) and Nexium, your Nexium has been replaced with a medication called Protonix (pantoprazole).  You can resume your Nexium when you come off of Plavix in 6 months.   ACTIVITY AND EXERCISE  Daily activity and exercise are an important part of your recovery. People recover at different rates depending on their general health and type of valve procedure.  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             - For six weeks for procedures through the chest wall or neck. NOTE: You will typically see one of our providers 7-14 days after your procedure to discuss [place] the above activities.      DRIVING  Do not drive until you are seen for follow up and cleared by a provider. Generally, we ask patient to not drive for 1 week after their procedure.  If you have been told by your doctor in the past that you may not drive, you must talk with him/her before you begin driving again.   DRESSING  Groin site: you may leave the clear dressing over the site for up to one week or until it falls off.   HYGIENE  If you had a femoral (leg) procedure, you may take a shower when you return home. After the shower, pat the site dry. Do NOT use powder, oils or lotions in your groin area until the site has completely healed.  If you had a chest procedure, you may shower when you return home unless specifically instructed not to by your discharging practitioner.             - DO NOT scrub incision; pat dry with a towel.             - DO NOT apply any lotions, oils, powders to the incision.             - No tub baths / swimming for at least 2 weeks.  If you notice any fevers, chills, increased pain, swelling, bleeding or pus, please contact  your doctor.   ADDITIONAL INFORMATION  If you are going to have an upcoming dental procedure, please contact our office as you will require antibiotics ahead of time to prevent infection on your heart valve.    If you have any questions or concerns you can call the structural heart phone during normal business hours 8am-4pm. If you have an urgent need after hours or weekends please call [phone] to talk to the on call provider for general cardiology. If you have an emergency that requires immediate attention, please call 911.    After TAVR Checklist  Check  Test Description   Follow up appointment in 1-2 weeks  You will see our structural heart physician assistant, Nell Range. Your incision sites will be checked and you will be cleared to drive and resume all normal activities if you are doing well.     1 month echo and follow up  You will have an echo to check on your new heart valve and be seen back in the office by Nell Range. Many times the echo is not read by your appointment time, but Joellen Jersey will call you later that day  or the following day to report your results.   Follow up with your primary cardiologist You will need to be seen by your primary cardiologist in the following 3-6 months after your 1 month appointment in the valve clinic. Often times your Plavix or Aspirin will be discontinued during this time, but this is decided on a case by case basis.    1 year echo and follow up You will have another echo to check on your heart valve after 1 year and be seen back in the office by Nell Range. This your last structural heart visit.   Bacterial endocarditis prophylaxis  You will have to take antibiotics for the rest of your life before all dental procedures (even teeth cleanings) to protect your heart valve. Antibiotics are also required before some surgeries. Please check with your cardiologist before scheduling any surgeries. Also, please make sure to tell us if you have a  penicillin allergy as you will require an alternative antibiotic.

## 2019-02-01 ENCOUNTER — Telehealth: Payer: Self-pay

## 2019-02-01 ENCOUNTER — Telehealth (HOSPITAL_COMMUNITY): Payer: Self-pay

## 2019-02-01 LAB — ECHOCARDIOGRAM COMPLETE
Height: 62 in
Weight: 3868.8 oz

## 2019-02-01 NOTE — Telephone Encounter (Signed)
Pt insurance is active and benefits verified through [employer]. Co-pay $0.00, DED $0.00/$0.00 met, out of pocket $5,900.00/$992.53 met, co-insurance 20%. No pre-authorization required. Mae M./[employer], 02/01/2019 @ 1104AM, [id number]  Will contact patient to see if she is interested in the Cardiac Rehab Program. If interested, patient will need to complete follow up appt. Once completed, patient will be contacted for scheduling upon review by the RN Navigator.

## 2019-02-01 NOTE — Telephone Encounter (Signed)
Attempted to call patient in regards to Cardiac Rehab - LM on VM 

## 2019-02-01 NOTE — Telephone Encounter (Signed)
Patient contacted regarding discharge from [facility] on 01/31/2019.  Patient understands to follow up with provider Nell Range PA-C on 02/07/2019 at 2:00 PM at [facility] office. Patient understands discharge instructions? yes Patient understands medications and regiment? yes Patient understands to bring all medications to this visit? Yes  The pt complains of itching at her groin site due to the bandage.  She has removed this and will leave site uncovered at this time.  She is allergic to tape and bandaids and feels that this is a reaction to the dressing.  The pt denies any pain, swelling or bleeding from site.  She will contact with any additional questions or concerns.

## 2019-02-05 ENCOUNTER — Telehealth: Payer: Self-pay | Admitting: Physician Assistant

## 2019-02-05 NOTE — Telephone Encounter (Signed)
[place] [facility]VE TEAM   Patient called into our office to report a new cough since discharge. It is a dry cough that is worse with speaking. Not related to supine position. No new weight gain or LE edema. No fevers, chills, body aches or loss of smell/taste. Given new symptoms with covid 19 pandemic, will change her TOC visit to a virtual visit. Consent below.   [name] PA-C  [facility]      YOUR CARDIOLOGY TEAM HAS ARRANGED FOR AN E-VISIT FOR YOUR APPOINTMENT - PLEASE REVIEW IMPORTANT INFORMATION BELOW SEVERAL DAYS PRIOR TO YOUR APPOINTMENT  Due to the recent COVID-19 pandemic, we are transitioning in-person office visits to tele-medicine visits in an effort to decrease unnecessary exposure to our patients, their families, and staff. These visits are billed to your insurance just like a normal visit is. We also encourage you to sign up for MyChart if you have not already done so. You will need a smartphone if possible. For patients that do not have this, we can still complete the visit using a regular telephone but do prefer a smartphone to enable video when possible. You may have a family member that lives with you that can help. If possible, we also ask that you have a blood pressure cuff and scale at home to measure your blood pressure, heart rate and weight prior to your scheduled appointment. Patients with clinical needs that need an in-person evaluation and testing will still be able to come to the office if absolutely necessary. If you have any questions, feel free to call our office.    YOUR PROVIDER WILL BE USING THE FOLLOWING PLATFORM TO COMPLETE YOUR VISIT: [email or web address] All you need is a smart phone. You will receive a text message from the provider through the [email or web address] app. You will follow the prompts and it will take you to a virtual visit with your provider. Please check your blood pressure, heart rate and weight prior to your scheduled  appointment.  CONSENT FOR TELE-HEALTH VISIT - PLEASE REVIEW  I hereby voluntarily request, consent and authorize [facility] and its employed or contracted physicians, physician assistants, nurse practitioners or other licensed health care professionals (the Practitioner), to provide me with telemedicine health care services (the "Services") as deemed necessary by the treating Practitioner. I acknowledge and consent to receive the Services by the Practitioner via telemedicine. I understand that the telemedicine visit will involve communicating with the Practitioner through live audiovisual communication technology and the disclosure of certain medical information by electronic transmission. I acknowledge that I have been given the opportunity to request an in-person assessment or other available alternative prior to the telemedicine visit and am voluntarily participating in the telemedicine visit.  I understand that I have the right to withhold or withdraw my consent to the use of telemedicine in the course of my care at any time, without affecting my right to future care or treatment, and that the Practitioner or I may terminate the telemedicine visit at any time. I understand that I have the right to inspect all information obtained and/or recorded in the course of the telemedicine visit and may receive copies of available information for a reasonable fee.  I understand that some of the potential risks of receiving the Services via telemedicine include:  Marland Kitchen Delay or interruption in medical evaluation due to technological equipment failure or disruption; . Information transmitted may not be sufficient (e.g. poor resolution of images) to allow for appropriate  medical decision making by the Practitioner; and/or  . In rare instances, security protocols could fail, causing a breach of personal health information.  Furthermore, I acknowledge that it is my responsibility to provide information about my  medical history, conditions and care that is complete and accurate to the best of my ability. I acknowledge that Practitioner's advice, recommendations, and/or decision may be based on factors not within their control, such as incomplete or inaccurate data provided by me or distortions of diagnostic images or specimens that may result from electronic transmissions. I understand that the practice of medicine is not an exact science and that Practitioner makes no warranties or guarantees regarding treatment outcomes. I acknowledge that I will receive a copy of this consent concurrently upon execution via email to the email address I last provided but may also request a printed copy by calling the office of [facility].    I understand that my insurance will be billed for this visit.   I have read or had this consent read to me. . I understand the contents of this consent, which adequately explains the benefits and risks of the Services being provided via telemedicine.  . I have been provided ample opportunity to ask questions regarding this consent and the Services and have had my questions answered to my satisfaction. . I give my informed consent for the services to be provided through the use of telemedicine in my medical care  By participating in this telemedicine visit I agree to the above.

## 2019-02-06 NOTE — Progress Notes (Signed)
48-hour Holter monitor 11/24/2018: Normal sinus rhythm.  Rare PVC and PAC.  Total 50 PVCs occurred (12 couplets).  Total of 25 PACs of which 2 were in atrial couplets.  Patient had 4 minutes of sinus tachycardia with fastest heart rate being 109 bpm.  No A. fib or SVT was noted.

## 2019-02-07 ENCOUNTER — Other Ambulatory Visit: Payer: Self-pay

## 2019-02-07 ENCOUNTER — Encounter: Payer: Self-pay | Admitting: Physician Assistant

## 2019-02-07 ENCOUNTER — Ambulatory Visit: Payer: Medicare Other | Admitting: Physician Assistant

## 2019-02-07 ENCOUNTER — Telehealth (INDEPENDENT_AMBULATORY_CARE_PROVIDER_SITE_OTHER): Payer: Medicare Other | Admitting: Physician Assistant

## 2019-02-07 VITALS — BP 127/86 | HR 74 | Ht 62.0 in | Wt 238.0 lb

## 2019-02-07 DIAGNOSIS — K219 Gastro-esophageal reflux disease without esophagitis: Secondary | ICD-10-CM

## 2019-02-07 DIAGNOSIS — Z952 Presence of prosthetic heart valve: Secondary | ICD-10-CM

## 2019-02-07 DIAGNOSIS — I10 Essential (primary) hypertension: Secondary | ICD-10-CM

## 2019-02-07 NOTE — Patient Instructions (Addendum)
Medication Instructions:  Your provider discussed the importance of taking an antibiotic prior to all dental visits to prevent damage to the heart valves from infection. You were given a prescription for AMOXIL 2,000mg  to take one hour prior to any dental appointment.  This is already in your medication list. Please let us know if you need refills for dental visits!  Follow-Up: Please keep your upcoming appointments!

## 2019-02-07 NOTE — Progress Notes (Signed)
[facility]   MULTIDISCIPLINARY HEART VALVE TEAM  Virtual Visit via Video Note   This visit type was conducted due to national recommendations for restrictions regarding the COVID-19 Pandemic (e.g. social distancing) in an effort to limit this patient's exposure and mitigate transmission in our community.  Due to her co-morbid illnesses, this patient is at least at moderate risk for complications without adequate follow up.  This format is felt to be most appropriate for this patient at this time.  All issues noted in this document were discussed and addressed.  A limited physical exam was performed with this format.  Please refer to the patient's chart for her consent to telehealth for [facility].   Evaluation Performed:  Follow-up visit  Date:  02/07/2019   ID:  Kara Lee, Kara Lee Dec 02, 1945, MRN [id number]  Patient Location: Home Provider Location: Office  PCP:  Reita Cliche, MD  Cardiologist:  Dr. Einar Gip / Dr. Burt Knack and Dr. Roxy Manns (TAVR)  Chief Complaint:  [facility] s/p TAVR  History of Present Illness:    Kara Lee is a 73 y.o. female with a history of OSA on CPAP, HLD, DMT2, morbid obesity (BMI >40), and severe AS s/p TAVR who presents for follow up via telehealth.   The patient does not have symptoms concerning for COVID-19 infection (fever, chills, cough, or new shortness of breath).   Patient states that she has been told that she had a heart murmur for more than 20 years. She denies any known history of rheumatic fever. Several years ago she was referred to Dr. Einar Gip who has been following her ever since. Previous echocardiograms have documented the presence of moderate aortic stenosis. She developed progressive symptoms of exertional shortness of breath and fatigue.In 2019 she underwent TEE and diagnostic cardiac catheterization. TEE confirmed the presence of moderate aortic stenosis with normal left ventricular systolic function. She does not have any  significant coronary artery disease. Continued medical therapy was recommended. Over the past year the patient has had further progression of symptoms of exertional shortness of breath and fatigue, as well as occasional dizzy spells without syncope. She reports occasional tightness across her chest without clear exertional angina. Follow-up transthoracic echocardiogram was performed demonstrating preserved left ventricular systolic function but significant progression of the severity of aortic stenosis with peak velocity across aortic valve reported 4.0 m/s corresponding to mean transvalvular gradient estimated 38 mmHg. There was mild to moderate aortic insufficiency.   She was evaluated by the multidisciplinary valve team and underwent successful TAVR with a 26 mm Edwards Sapien 3 Ultra THV via the TF approach on 01/30/19. Post operative echo showed EF 60-65%, normally functioning TAVR with mean gradient of 21 mm Hg and no PVL. She was discharged on POD1 on aspirin and plavix.    Today she presents via telemedicine for follow up. Visit changed to virtual given new cough. No CP or SOB. No LE edema, orthopnea or PND. No dizziness or syncope. No blood in stool or urine. No palpitations. Still has a cough. Tried to get a covid test but site was closed when she got here. No fever, aches or chills. Has been feeling better in terms of her dyspnea and energy since TAVR. She has been walking everyday.    Past Medical History:  Diagnosis Date  . Anxiety   . Arthritis   . Complication of anesthesia   . Concussion November 09, 2012   From a fall at home on deck  . Depression  postpartum depression after 2nd child  . Diabetes mellitus ([place])   . Fatty liver   . GERD (gastroesophageal reflux disease)   . HLD (hyperlipidemia)   . Morbid obesity ([name])   . OSA on CPAP   . Osteoarthritis of right knee 07/31/2013  . PONV (postoperative nausea and vomiting)   . Rosacea   . S/P TAVR (transcatheter aortic valve  replacement)   . Severe aortic stenosis    Past Surgical History:  Procedure Laterality Date  . ABDOMINAL HYSTERECTOMY     still has uterus  . ANKLE RECONSTRUCTION Left   . BIOPSY BREAST Bilateral   . CESAREAN SECTION     x 2  . CHOLECYSTECTOMY    . COLONOSCOPY    . EYE SURGERY     lasik; X3 for crossed eyes; 1 eyelid surgery  . HERNIA REPAIR    . PARTIAL KNEE ARTHROPLASTY Right 07/31/2013   Procedure: RIGHT UNICOMPARTMENTAL KNEE;  Surgeon: Johnny Bridge, MD;  Location: [facility];  Service: Orthopedics;  Laterality: Right;  . RIGHT/LEFT HEART CATH AND CORONARY ANGIOGRAPHY N/A 10/04/2017   Procedure: RIGHT/LEFT HEART CATH AND CORONARY ANGIOGRAPHY;  Surgeon: Adrian Prows, MD;  Location: [place] [facility];  Service: Cardiovascular;  Laterality: N/A;  . TEE WITHOUT CARDIOVERSION N/A 08/31/2017   Procedure: TRANSESOPHAGEAL ECHOCARDIOGRAM (TEE);  Surgeon: Adrian Prows, MD;  Location: [place];  Service: Cardiovascular;  Laterality: N/A;  . TEE WITHOUT CARDIOVERSION N/A 01/30/2019   Procedure: TRANSESOPHAGEAL ECHOCARDIOGRAM (TEE);  Surgeon: Sherren Mocha, MD;  Location: [place] [facility];  Service: Open Heart Surgery;  Laterality: N/A;  . TRANSCATHETER AORTIC VALVE REPLACEMENT, TRANSFEMORAL N/A 01/30/2019   Procedure: TRANSCATHETER AORTIC VALVE REPLACEMENT, TRANSFEMORAL;  Surgeon: Sherren Mocha, MD;  Location: [place] [facility];  Service: Open Heart Surgery;  Laterality: N/A;  . TUBAL LIGATION       Current Meds  Medication Sig  . acetaminophen (TYLENOL) 650 MG CR tablet Take 1,300 mg by mouth every 8 (eight) hours as needed for pain.  Marland Kitchen amoxicillin (AMOXIL) 500 MG capsule Take 2,000 mg by mouth See admin instructions. Take 2000 mg 1 hour prior to dental work  . Ascorbic Acid (VITAMIN C PO) Take 1 tablet by mouth daily as needed (immune support).  Marland Kitchen aspirin EC 81 MG tablet Take 81 mg by mouth at bedtime.   Marland Kitchen atenolol (TENORMIN) 25 MG tablet TAKE 1 TABLET BY MOUTH TWICE DAILY (Patient  taking differently: Take 25 mg by mouth 2 (two) times daily. )  . clonazePAM (KLONOPIN) 0.5 MG tablet Take 0.5 mg by mouth 2 (two) times daily.  . clopidogrel (PLAVIX) 75 MG tablet Take 1 tablet (75 mg total) by mouth daily with breakfast.  . estradiol (ESTRACE) 0.1 MG/GM vaginal cream Place 1 Applicatorful vaginally as needed (irritation).   . fexofenadine (ALLEGRA) 180 MG tablet Take 90 mg by mouth daily as needed for allergies or rhinitis.  Marland Kitchen FLUoxetine (PROZAC) 40 MG capsule Take 40 mg by mouth daily.  . fluticasone (FLONASE) 50 MCG/ACT nasal spray Place 1 spray into both nostrils daily as needed for allergies.   . hydrocortisone (ANUSOL-HC) 2.5 % rectal cream Place 1 application rectally daily as needed for hemorrhoids.   Marland Kitchen LIVALO 2 MG TABS TAKE 1 TABLET BY MOUTH DAILY (Patient taking differently: Take 2 mg by mouth at bedtime. )  . metFORMIN (GLUCOPHAGE-XR) 500 MG 24 hr tablet Take 1 tablet (500 mg total) by mouth 2 (two) times daily.  . pantoprazole (PROTONIX) 40 MG  tablet Take 1 tablet (40 mg total) by mouth daily.  Marland Kitchen Propylene Glycol (SYSTANE BALANCE) 0.6 % SOLN Place 1 drop into both eyes daily as needed (dry eyes).     Allergies:   Bee venom, Latex, Lovenox [enoxaparin sodium], Minocycline, Morphine and related, Nickel, Shellfish allergy, Statins, Sulfa antibiotics, Tape, and Glimepiride   Social History   Tobacco Use  . Smoking status: Never Smoker  . Smokeless tobacco: Never Used  Substance Use Topics  . Alcohol use: Not Currently  . Drug use: No     Family Hx: The patient's family history includes COPD in her mother; Cancer - Lung in her mother; Diabetes type II in her mother; Pancreatitis in her mother.  ROS:   Please see the history of present illness.    All other systems reviewed and are negative.   Prior CV studies:   The following studies were reviewed today:  TAVR OPERATIVE NOTE   Date of Procedure:01/30/2019  Preoperative  Diagnosis:Severe Aortic Stenosis   Postoperative Diagnosis:Same   Procedure:   Transcatheter Aortic Valve Replacement - PercutaneousRightTransfemoral Approach Edwards Sapien 3 Ultra THV (size 64mm, model # 9750TFX, serial # [id number])  Co-Surgeons:Clarence H. Roxy Manns, MD and Sherren Mocha, MD  Anesthesiologist:David Linna Caprice, MD  Dala Dock, MD  Pre-operative Echo Findings: ? Severe aortic stenosis ? Normalleft ventricular systolic function  Post-operative Echo Findings: ? Noparavalvular leak ? Normalleft ventricular systolic function _____________   Echo 01/31/19: IMPRESSIONS  1. Left ventricular ejection fraction, by visual estimation, is 60 to 65%. The left ventricle has normal function. Normal left ventricular size. There is moderately increased left ventricular hypertrophy.  2. Elevated mean left atrial pressure.  3. Left ventricular diastolic Doppler parameters are consistent with impaired relaxation pattern of LV diastolic filling.  4. Global right ventricle has normal systolic function.The right ventricular size is normal.  5. Left atrial size was mildly dilated.  6. Right atrial size was normal.  7. Severe mitral annular calcification.  8. The mitral valve is normal in structure. No evidence of mitral valve regurgitation. No evidence of mitral stenosis.  9. The tricuspid valve is normal in structure. Tricuspid valve regurgitation is trivial. 10. Aortic valve regurgitation was not visualized by color flow Doppler. Structurally normal aortic valve, with no evidence of sclerosis or stenosis. 11. The pulmonic valve was normal in structure. Pulmonic valve regurgitation is not visualized by color flow Doppler. 12. The inferior vena cava is normal in size with greater than 50% respiratory variability, suggesting right atrial pressure of 3 mmHg. 13.  Normal LV systolic function; moderate LVH; grade 1 diastolic dysfunction; s/p TAVR with mean gradient 21 mmHg and no AI; mild LAE.  Labs/Other Tests and Data Reviewed:    EKG:  No ECG reviewed.  Recent Labs: 01/26/2019: ALT 28; B Natriuretic Peptide 101.3 01/31/2019: BUN 9; Creatinine, Ser 0.81; Hemoglobin 10.8; Magnesium 1.8; Platelets 176; Potassium 3.7; Sodium 138   Recent Lipid Panel No results found for: CHOL, TRIG, HDL, CHOLHDL, LDLCALC, LDLDIRECT  Wt Readings from Last 3 Encounters:  02/07/19 238 lb (108 kg)  01/31/19 241 lb 12.8 oz (109.7 kg)  01/26/19 243 lb 11.2 oz (110.5 kg)     Objective:    Vital Signs:  BP 127/86   Pulse 74   Ht 5\' 2"  (1.575 m)   Wt 238 lb (108 kg)   BMI 43.53 kg/m    Well nourished, well developed female in no acute distress. Obese    ASSESSMENT & PLAN:    Severe  AS s/p TAVR:groin sites are healing well per patient report. No s/s HAVB. SBE prophylaxis discussed; I have RX'd amoxicillin. Continue on aspirin and plavix. I will see her back 11/5 for one month follow up and echo.   HTN: BP well controlled. Continue current regimen   GERD: Nexium was changed to Protonix given potential drug drug interaction with Plavix  Morbid obesity: Body mass index is 43.53 kg/m. Excited to start working with virtual cardiac rehab once her cough resolves.    COVID-19 Education: The signs and symptoms of COVID-19 were discussed with the patient and how to seek care for testing (follow up with PCP or arrange E-visit).  The importance of social distancing was discussed today.  Time:   Today, I have spent 20 minutes with the patient with telehealth technology discussing the above problems.     Medication Adjustments/Labs and Tests Ordered: Current medicines are reviewed at length with the patient today.  Concerns regarding medicines are outlined above.   Tests Ordered: No orders of the defined types were placed in this encounter.   Medication  Changes: No orders of the defined types were placed in this encounter.   Disposition:  Follow up in 3 week(s)  Signed, [name], PA-C  02/07/2019 2:35 PM    [facility]

## 2019-02-08 ENCOUNTER — Encounter (HOSPITAL_COMMUNITY): Payer: Self-pay

## 2019-02-12 ENCOUNTER — Encounter (HOSPITAL_COMMUNITY): Payer: Self-pay | Admitting: Cardiovascular Disease

## 2019-02-14 ENCOUNTER — Other Ambulatory Visit: Payer: Self-pay

## 2019-02-14 DIAGNOSIS — K624 Stenosis of anus and rectum: Secondary | ICD-10-CM

## 2019-02-15 ENCOUNTER — Ambulatory Visit: Payer: Medicare Other | Admitting: Cardiology

## 2019-02-15 LAB — NOVEL CORONAVIRUS, NAA: SARS-CoV-2, NAA: NOT DETECTED

## 2019-02-21 ENCOUNTER — Telehealth (HOSPITAL_COMMUNITY): Payer: Self-pay

## 2019-02-21 NOTE — Telephone Encounter (Signed)
No response from pt regarding CR.  Closed referral.  

## 2019-02-21 NOTE — Progress Notes (Signed)
[facility]                                       Cardiology Office Note    Date:  02/22/2019   ID:  Kara Lee, DOB 1945-05-26, MRN [id number]  PCP:  Reita Cliche, MD  Cardiologist: Dr. Einar Gip / Dr. Burt Knack and Dr. Roxy Manns (TAVR)  CC: 1 month s/p TAVR  History of Present Illness:  Kara Lee is a 73 y.o. 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  Patient states that she has been told that she had a heart murmur for more than 20 years. She denies any known history of rheumatic fever. Several years ago she was referred to Dr.Ganjiwho has been following her ever since. Previous echocardiograms have documented the presence of moderate aortic stenosis. She developed progressive symptoms of exertional shortness of breath and fatigue.In 2019 she underwentTEEand diagnostic cardiac catheterization. TEE confirmed the presence of moderate aortic stenosis with normal left ventricular systolic function. She does not have any significant coronary artery disease. Continued medical therapy was recommended. Over the past year the patient has had further progression of symptoms of exertional shortness of breath and fatigue, as well as occasional dizzy spells without syncope. She reports occasional tightness across her chest without clear exertional angina. Follow-up transthoracic echocardiogram was performed demonstrating preserved left ventricular systolic function but significant progression of the severity of aortic stenosis with peak velocity across aortic valve reported 4.0 m/s corresponding to mean transvalvular gradient estimated 38 mmHg. There was mild to moderate aortic insufficiency.  She was evaluated by the multidisciplinary valve team and underwent successful TAVR with a74mm Edwards Sapien 3 UltraTHV via the TF approach on  01/30/19. Post operative echoshowed EF 60-65%, normally functioning TAVR with mean gradient of 21 mm Hg and no PVL. She was discharged on POD1 on aspirin and plavix.    She developed a cough after discharge and follow up was changed to virtual visit. She was doing well with no issues. Today she presents to clinic for follow up. She is doing quite well. Cough has improved. Covid was negative. She has had no chest pain or shortness of breath. She did have an episode last week of right sided pain that she thinks was gas. No LE edema, orthopnea or PND. She can tell a big difference since having her TAVR in terms of her breathing and exercise tolerance.   Past Medical History:  Diagnosis Date  . Anxiety   . Arthritis   . Complication of anesthesia   . Concussion November 09, 2012   From a fall at home on deck  . Depression    postpartum depression after 2nd child  . Diabetes mellitus ([place])   . Fatty liver   . GERD (gastroesophageal reflux disease)   . HLD (hyperlipidemia)   . Morbid obesity ([place])   . OSA on CPAP   . Osteoarthritis of right knee 07/31/2013  . PONV (postoperative nausea and vomiting)   . Rosacea   . S/P TAVR (transcatheter aortic valve replacement)   . Severe aortic stenosis     Past Surgical History:  Procedure Laterality Date  . ABDOMINAL HYSTERECTOMY     still has uterus  . ANKLE RECONSTRUCTION Left   .  BIOPSY BREAST Bilateral   . CESAREAN SECTION     x 2  . CHOLECYSTECTOMY    . COLONOSCOPY    . EYE SURGERY     lasik; X3 for crossed eyes; 1 eyelid surgery  . HERNIA REPAIR    . PARTIAL KNEE ARTHROPLASTY Right 07/31/2013   Procedure: RIGHT UNICOMPARTMENTAL KNEE;  Surgeon: Johnny Bridge, MD;  Location: [place];  Service: Orthopedics;  Laterality: Right;  . RIGHT/LEFT HEART CATH AND CORONARY ANGIOGRAPHY N/A 10/04/2017   Procedure: RIGHT/LEFT HEART CATH AND CORONARY ANGIOGRAPHY;  Surgeon: Adrian Prows, MD;  Location: [facility];  Service: Cardiovascular;  Laterality:  N/A;  . TEE WITHOUT CARDIOVERSION N/A 08/31/2017   Procedure: TRANSESOPHAGEAL ECHOCARDIOGRAM (TEE);  Surgeon: Adrian Prows, MD;  Location: [place];  Service: Cardiovascular;  Laterality: N/A;  . TEE WITHOUT CARDIOVERSION N/A 01/30/2019   Procedure: TRANSESOPHAGEAL ECHOCARDIOGRAM (TEE);  Surgeon: Sherren Mocha, MD;  Location: [facility];  Service: Open Heart Surgery;  Laterality: N/A;  . TRANSCATHETER AORTIC VALVE REPLACEMENT, TRANSFEMORAL N/A 01/30/2019   Procedure: TRANSCATHETER AORTIC VALVE REPLACEMENT, TRANSFEMORAL;  Surgeon: Sherren Mocha, MD;  Location: [facility];  Service: Open Heart Surgery;  Laterality: N/A;  . TUBAL LIGATION      Current Medications: Outpatient Medications Prior to Visit  Medication Sig Dispense Refill  . acetaminophen (TYLENOL) 650 MG CR tablet Take 1,300 mg by mouth every 8 (eight) hours as needed for pain.    . Ascorbic Acid (VITAMIN C PO) Take 1 tablet by mouth daily as needed (immune support).    Marland Kitchen aspirin EC 81 MG tablet Take 81 mg by mouth at bedtime.     Marland Kitchen atenolol (TENORMIN) 25 MG tablet Take 25 mg by mouth 2 (two) times daily.    . clonazePAM (KLONOPIN) 0.5 MG tablet Take 0.5 mg by mouth 2 (two) times daily.    . clopidogrel (PLAVIX) 75 MG tablet Take 1 tablet (75 mg total) by mouth daily with breakfast. 90 tablet 1  . diphenhydrAMINE (BENADRYL ALLERGY) 25 mg capsule Take 25 mg by mouth as needed.    Marland Kitchen estradiol (ESTRACE) 0.1 MG/GM vaginal cream Place 1 Applicatorful vaginally as needed (irritation).     Marland Kitchen FLUoxetine (PROZAC) 40 MG capsule Take 40 mg by mouth daily.    . fluticasone (FLONASE) 50 MCG/ACT nasal spray Place 1 spray into both nostrils daily as needed for allergies.     . hydrocortisone (ANUSOL-HC) 2.5 % rectal cream Place 1 application rectally daily as needed for hemorrhoids.     . metFORMIN (GLUCOPHAGE-XR) 500 MG 24 hr tablet Take 1 tablet (500 mg total) by mouth 2 (two) times daily.    . pantoprazole (PROTONIX) 40 MG  tablet Take 1 tablet (40 mg total) by mouth daily. 90 tablet 1  . Pitavastatin Calcium (LIVALO) 2 MG TABS Take 2 mg by mouth at bedtime.    Marland Kitchen Propylene Glycol (SYSTANE BALANCE) 0.6 % SOLN Place 1 drop into both eyes daily as needed (dry eyes).    Marland Kitchen amoxicillin (AMOXIL) 500 MG capsule Take 2,000 mg by mouth See admin instructions. Take 2000 mg 1 hour prior to dental work    . atenolol (TENORMIN) 25 MG tablet TAKE 1 TABLET BY MOUTH TWICE DAILY (Patient taking differently: Take 25 mg by mouth 2 (two) times daily. ) 180 tablet 2  . fexofenadine (ALLEGRA) 180 MG tablet Take 90 mg by mouth daily as needed for allergies or rhinitis.    Marland Kitchen LIVALO 2  MG TABS TAKE 1 TABLET BY MOUTH DAILY (Patient taking differently: Take 2 mg by mouth at bedtime. ) 90 tablet 3   No facility-administered medications prior to visit.      Allergies:   Bee venom, Latex, Lovenox [enoxaparin sodium], Minocycline, Morphine and related, Nickel, Shellfish allergy, Statins, Sulfa antibiotics, Tape, and Glimepiride   Social History   Socioeconomic History  . Marital status: Married    Spouse name: Not on file  . Number of children: 2  . Years of education: Not on file  . Highest education level: Not on file  Occupational History  . Not on file  Social Needs  . Financial resource strain: Not on file  . Food insecurity    Worry: Not on file    Inability: Not on file  . Transportation needs    Medical: Not on file    Non-medical: Not on file  Tobacco Use  . Smoking status: Never Smoker  . Smokeless tobacco: Never Used  Substance and Sexual Activity  . Alcohol use: Not Currently  . Drug use: No  . Sexual activity: Not on file  Lifestyle  . Physical activity    Days per week: Not on file    Minutes per session: Not on file  . Stress: Not on file  Relationships  . Social Herbalist on phone: Not on file    Gets together: Not on file    Attends religious service: Not on file    Active member of club or  organization: Not on file    Attends meetings of clubs or organizations: Not on file    Relationship status: Not on file  Other Topics Concern  . Not on file  Social History Narrative  . Not on file     Family History:  The patient's family history includes COPD in her mother; Cancer - Lung in her mother; Diabetes type II in her mother; Pancreatitis in her mother.     ROS:   Please see the history of present illness.    ROS All other systems reviewed and are negative.   PHYSICAL EXAM:   VS:  BP 122/64   Pulse 69   Ht 5\' 2"  (1.575 m)   Wt 240 lb 12.8 oz (109.2 kg)   SpO2 97%   BMI 44.04 kg/m    GEN: Well nourished, well developed, in no acute distress, obese HEENT: normal Neck: no JVD or masses Cardiac: RRR; soft flow murmur.No, rubs, or gallops,no edema  Respiratory:  clear to auscultation bilaterally, normal work of breathing GI: soft, nontender, nondistended, + BS MS: no deformity or atrophy Skin: warm and dry, no rash Neuro:  Alert and Oriented x 3, Strength and sensation are intact Psych: euthymic mood, full affect   Wt Readings from Last 3 Encounters:  02/22/19 240 lb 12.8 oz (109.2 kg)  02/07/19 238 lb (108 kg)  01/31/19 241 lb 12.8 oz (109.7 kg)      Studies/Labs Reviewed:   EKG:  EKG is ordered today.  The ekg ordered today demonstrates sinus with LAFB, HR 66  Recent Labs: 01/26/2019: ALT 28; B Natriuretic Peptide 101.3 01/31/2019: BUN 9; Creatinine, Ser 0.81; Hemoglobin 10.8; Magnesium 1.8; Platelets 176; Potassium 3.7; Sodium 138   Lipid Panel No results found for: CHOL, TRIG, HDL, CHOLHDL, VLDL, LDLCALC, LDLDIRECT  Additional studies/ records that were reviewed today include:  TAVR OPERATIVE NOTE   Date of Procedure:01/30/2019  Preoperative Diagnosis:Severe Aortic Stenosis   Postoperative Diagnosis:Same  Procedure:   Transcatheter Aortic Valve Replacement - PercutaneousRightTransfemoral Approach  Edwards Sapien 3 Ultra THV (size 63mm, model # [id number], serial # [id number])  Co-Surgeons:Clarence H. Roxy Manns, MD and Sherren Mocha, MD  Anesthesiologist:David Linna Caprice, MD  Dala Dock, MD  Pre-operative Echo Findings: ? Severe aortic stenosis ? Normalleft ventricular systolic function  Post-operative Echo Findings: ? Noparavalvular leak ? Normalleft ventricular systolic function  _____________   Echo10/14/20: IMPRESSIONS 1. Left ventricular ejection fraction, by visual estimation, is 60 to 65%. The left ventricle has normal function. Normal left ventricular size. There is moderately increased left ventricular hypertrophy. 2. Elevated mean left atrial pressure. 3. Left ventricular diastolic Doppler parameters are consistent with impaired relaxation pattern of LV diastolic filling. 4. Global right ventricle has normal systolic function.The right ventricular size is normal. 5. Left atrial size was mildly dilated. 6. Right atrial size was normal. 7. Severe mitral annular calcification. 8. The mitral valve is normal in structure. No evidence of mitral valve regurgitation. No evidence of mitral stenosis. 9. The tricuspid valve is normal in structure. Tricuspid valve regurgitation is trivial. 10. Aortic valve regurgitation was not visualized by color flow Doppler. Structurally normal aortic valve, with no evidence of sclerosis or stenosis. 11. The pulmonic valve was normal in structure. Pulmonic valve regurgitation is not visualized by color flow Doppler. 12. The inferior vena cava is normal in size with greater than 50% respiratory variability, suggesting right atrial pressure of 3 mmHg. 13. Normal LV systolic function; moderate LVH; grade 1 diastolic dysfunction; s/p TAVR with mean gradient 21 mmHg and no AI; mild LAE.  _________________   Echo 02/22/19 IMPRESSIONS   1. Left ventricular ejection fraction, by visual estimation, is 60 to 65%. The left ventricle has normal function. There is no left ventricular hypertrophy.  2. Left ventricular diastolic parameters are consistent with Grade II diastolic dysfunction (pseudonormalization).  3. Elevated left ventricular end-diastolic pressure.  4. Global right ventricle has normal systolic function.The right ventricular size is normal. No increase in right ventricular wall thickness.  5. Left atrial size was moderately dilated.  6. Right atrial size was mildly dilated.  7. Severe mitral annular calcification.  8. The mitral valve is normal in structure. No evidence of mitral valve regurgitation. No evidence of mitral stenosis.  9. The tricuspid valve is normal in structure. Tricuspid valve regurgitation is not demonstrated. 10. 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 Aortic valve mean gradient measures 14.0 mmHg. Aortic valve peak gradient measures 27.0 mmHg. Aortic valve area, by VTI measures 1.36 cm. 11. The pulmonic valve was normal in structure. Pulmonic valve regurgitation is not visualized. 12. The inferior vena cava is normal in size with greater than 50% respiratory variability, suggesting right atrial pressure of 3 mmHg.    ASSESSMENT & PLAN:   Severe AS s/p TAVR: echo today shows EF 60%, normally functioning TAVR with mean gradient of 14 mm Hg (improved from 21 mm Hg on POD1 echo) and no PVL. She is doing excellent with has NYHA class I symptoms. She can tell a big improvement in how she feels since TAVR. SBE prophylaxis discussed; she has amoxicill. Plavix can be discontinued after 6 months of therapy ( 07/2018).  HTN:Bp well controlled today.   GERD: Nexium was changed to Protonix given potential drug drug interaction with Plavix. She actually prefers Protonix and would like to continue on it.   Morbid obesity:Body mass index is 44.04  kg/m.  Plans on working with  virtual cardiac rehab.    Medication Adjustments/Labs and Tests Ordered: Current medicines are reviewed at length with the patient today.  Concerns regarding medicines are outlined above.  Medication changes, Labs and Tests ordered today are listed in the Patient Instructions below. Patient Instructions  Medication Instructions:  1) you may STOP PLAVIX 08/01/2019   Follow-Up: We will call you to arrange 1 year TAVR echo and follow-up visits.    Signed, [name], PA-C  02/22/2019 4:36 PM    [facility] [address], [place], [place]  [address] Phone: [phone]; Fax: [phone]

## 2019-02-22 ENCOUNTER — Ambulatory Visit: Payer: Medicare Other | Admitting: Physician Assistant

## 2019-02-22 ENCOUNTER — Ambulatory Visit (HOSPITAL_COMMUNITY): Payer: Medicare Other | Attending: Internal Medicine

## 2019-02-22 ENCOUNTER — Other Ambulatory Visit: Payer: Self-pay

## 2019-02-22 ENCOUNTER — Other Ambulatory Visit: Payer: Self-pay | Admitting: Physician Assistant

## 2019-02-22 ENCOUNTER — Encounter: Payer: Self-pay | Admitting: Physician Assistant

## 2019-02-22 VITALS — BP 122/64 | HR 69 | Ht 62.0 in | Wt 240.8 lb

## 2019-02-22 DIAGNOSIS — K219 Gastro-esophageal reflux disease without esophagitis: Secondary | ICD-10-CM

## 2019-02-22 DIAGNOSIS — Z952 Presence of prosthetic heart valve: Secondary | ICD-10-CM | POA: Diagnosis not present

## 2019-02-22 DIAGNOSIS — I10 Essential (primary) hypertension: Secondary | ICD-10-CM

## 2019-02-22 MED ORDER — AMOXICILLIN 500 MG PO CAPS: 2000.0000 mg | ORAL_CAPSULE | ORAL | 12 refills | Status: DC

## 2019-02-22 NOTE — Patient Instructions (Addendum)
Medication Instructions:  1) you may STOP PLAVIX 08/01/2019   Follow-Up: We will call you to arrange 1 year TAVR echo and follow-up visits.

## 2019-02-26 ENCOUNTER — Ambulatory Visit: Payer: Medicare Other | Admitting: Cardiology

## 2019-02-26 NOTE — Progress Notes (Deleted)
Primary Physician:  Reita Cliche, MD   Patient ID: Kara Lee, female    DOB: [birth year], 73 y.o.   MRN: [id number]  Subjective:    No chief complaint on file.  This visit type was conducted due to national recommendations for restrictions regarding the COVID-19 Pandemic (e.g. social distancing).  This format is felt to be most appropriate for this patient at this time.  All issues noted in this document were discussed and addressed.  No physical exam was performed (except for noted visual exam findings with Telehealth visits).  The patient has consented to conduct a Telehealth visit and understands insurance will be billed.   I discussed the limitations of evaluation and management by telemedicine and the availability of in person appointments. The patient expressed understanding and agreed to proceed.  Virtual Visit via Video Note is as below  I connected with Kara Lee, on 02/26/19 at 1305 by a video enabled telemedicine application and verified that I am speaking with the correct person using two identifiers.     I have discussed with her regarding the safety during COVID Pandemic and steps and precautions including social distancing with the patient.     HPI: Kara Lee  is a 73 y.o. female  with morbid obesity, diabetes mellitus, hyperlipidemia, unable to tolerate statins except has been tolerating Livalo, history of SVT with no recurrence. Due to worsening shortness of breath, echocardiogram revealing severe aortic stenosis, she underwent TEE on 09/01/2017 which revealed moderate aortic stenosis. Lexiscan nuclear stress testing on 09/16/2017 was considered intermediate risk study, underwent right and left heart catheterization on 10/04/2017 that revealed normal coronary arteries and no evidence of pulmonary hypertension.   Patient was last seen 3 months ago for follow-up for aortic stenosis.  Patient has been having worsening episodes of dyspnea on exertion. She is  complaining of episodes of palpitations that are almost daily. Aortic stenotic murmur was felt to be slightly more prominent.  She underwent repeat echocardiogram on 06/11/2018 that showed continued moderate to severe aortic stenosis with stable peak pressure gradient.  No significant changes compared to previous echocardiogram in May 2019. Referral was placed to CV surgery to discuss possible options of TAVR given her worsening symptoms; however, she states that she was never contacted for an appointment. She was previously having dizziness that has now resolved. She does continue to notice worsening dyspnea and fatigue. Palpitations resolve with vagal maneuvers. States that she has been told to have "tachycardia".  Her husband has recently completed treatement for HER-2 positive breast cancer and is now in remission.    Past Medical History:  Diagnosis Date  . Anxiety   . Arthritis   . Complication of anesthesia   . Concussion November 09, 2012   From a fall at home on deck  . Depression    postpartum depression after 2nd child  . Diabetes mellitus ([place])   . Fatty liver   . GERD (gastroesophageal reflux disease)   . HLD (hyperlipidemia)   . Morbid obesity ([place])   . OSA on CPAP   . Osteoarthritis of right knee 07/31/2013  . PONV (postoperative nausea and vomiting)   . Rosacea   . S/P TAVR (transcatheter aortic valve replacement)   . Severe aortic stenosis     Past Surgical History:  Procedure Laterality Date  . ABDOMINAL HYSTERECTOMY     still has uterus  . ANKLE RECONSTRUCTION Left   . BIOPSY BREAST Bilateral   . CESAREAN SECTION  x 2  . CHOLECYSTECTOMY    . COLONOSCOPY    . EYE SURGERY     lasik; X3 for crossed eyes; 1 eyelid surgery  . HERNIA REPAIR    . PARTIAL KNEE ARTHROPLASTY Right 07/31/2013   Procedure: RIGHT UNICOMPARTMENTAL KNEE;  Surgeon: Johnny Bridge, MD;  Location: [facility];  Service: Orthopedics;  Laterality: Right;  . RIGHT/LEFT HEART CATH AND CORONARY  ANGIOGRAPHY N/A 10/04/2017   Procedure: RIGHT/LEFT HEART CATH AND CORONARY ANGIOGRAPHY;  Surgeon: Adrian Prows, MD;  Location: [facility];  Service: Cardiovascular;  Laterality: N/A;  . TEE WITHOUT CARDIOVERSION N/A 08/31/2017   Procedure: TRANSESOPHAGEAL ECHOCARDIOGRAM (TEE);  Surgeon: Adrian Prows, MD;  Location: [facility];  Service: Cardiovascular;  Laterality: N/A;  . TEE WITHOUT CARDIOVERSION N/A 01/30/2019   Procedure: TRANSESOPHAGEAL ECHOCARDIOGRAM (TEE);  Surgeon: Sherren Mocha, MD;  Location: [facility];  Service: Open Heart Surgery;  Laterality: N/A;  . TRANSCATHETER AORTIC VALVE REPLACEMENT, TRANSFEMORAL N/A 01/30/2019   Procedure: TRANSCATHETER AORTIC VALVE REPLACEMENT, TRANSFEMORAL;  Surgeon: Sherren Mocha, MD;  Location: [facility];  Service: Open Heart Surgery;  Laterality: N/A;  . TUBAL LIGATION      Social History   Socioeconomic History  . Marital status: Married    Spouse name: Not on file  . Number of children: 2  . Years of education: Not on file  . Highest education level: Not on file  Occupational History  . Not on file  Social Needs  . Financial resource strain: Not on file  . Food insecurity    Worry: Not on file    Inability: Not on file  . Transportation needs    Medical: Not on file    Non-medical: Not on file  Tobacco Use  . Smoking status: Never Smoker  . Smokeless tobacco: Never Used  Substance and Sexual Activity  . Alcohol use: Not Currently  . Drug use: No  . Sexual activity: Not on file  Lifestyle  . Physical activity    Days per week: Not on file    Minutes per session: Not on file  . Stress: Not on file  Relationships  . Social Herbalist on phone: Not on file    Gets together: Not on file    Attends religious service: Not on file    Active member of club or organization: Not on file    Attends meetings of clubs or organizations: Not on file    Relationship status: Not on file  . Intimate partner  violence    Fear of current or ex partner: Not on file    Emotionally abused: Not on file    Physically abused: Not on file    Forced sexual activity: Not on file  Other Topics Concern  . Not on file  Social History Narrative  . Not on file    Review of Systems  Constitution: Positive for malaise/fatigue. Negative for decreased appetite, weight gain and weight loss.  Eyes: Negative for visual disturbance.  Cardiovascular: Positive for dyspnea on exertion and palpitations. Negative for chest pain, claudication, leg swelling, orthopnea and syncope.  Respiratory: Negative for hemoptysis, sleep disturbances due to breathing and wheezing.   Endocrine: Negative for cold intolerance and heat intolerance.  Hematologic/Lymphatic: Negative for bleeding problem. Does not bruise/bleed easily.  Skin: Negative for nail changes.  Musculoskeletal: Negative for muscle weakness and myalgias.  Gastrointestinal: Negative for abdominal pain, change in bowel habit, nausea and vomiting.  Neurological:  Negative for difficulty with concentration, dizziness, focal weakness and headaches.  Psychiatric/Behavioral: Negative for altered mental status and suicidal ideas.  All other systems reviewed and are negative.     Objective:  There were no vitals taken for this visit. There is no height or weight on file to calculate BMI.    Physical exam not performed or limited due to virtual visit.  Patient appeared to be in no distress, Neck was supple, respiration was not labored.  Please see exam details from prior visit is as below.   Physical Exam  Constitutional: She is oriented to person, place, and time. Vital signs are normal. She appears well-developed and well-nourished.  HENT:  Head: Normocephalic and atraumatic.  Neck: Normal range of motion.  Cardiovascular: Normal rate, regular rhythm and intact distal pulses.  Murmur heard.  Rumbling crescendo-decrescendo midsystolic murmur is present with a grade  of 3/6 at the upper right sternal border radiating to the neck. S2 diminished intensity Pulses:      Carotid pulses are on the right side with bruit and on the left side with bruit.      Femoral pulses are 1+ on the right side and 1+ on the left side.      Popliteal pulses are 1+ on the right side and 1+ on the left side.       Dorsalis pedis pulses are 2+ on the right side and 2+ on the left side.       Posterior tibial pulses are 0 on the right side and 0 on the left side.  Pulmonary/Chest: Effort normal and breath sounds normal. No accessory muscle usage. No respiratory distress.  Abdominal: Soft. Bowel sounds are normal.  Musculoskeletal: Normal range of motion.  Neurological: She is alert and oriented to person, place, and time.  Skin: Skin is warm, dry and intact.  Vitals reviewed.  Radiology: No results found.  Laboratory examination:    CMP Latest Ref Rng & Units 01/31/2019 01/30/2019 01/30/2019  Glucose 70 - 99 mg/dL 142(H) 190(H) 188(H)  BUN 8 - 23 mg/dL '9 11 11  '$ Creatinine 0.44 - 1.00 mg/dL 0.81 0.40(L) 0.50  Sodium 135 - 145 mmol/L 138 141 142  Potassium 3.5 - 5.1 mmol/L 3.7 4.0 4.3  Chloride 98 - 111 mmol/L 105 108 108  CO2 22 - 32 mmol/L 22 - -  Calcium 8.9 - 10.3 mg/dL 8.8(L) - -  Total Protein 6.5 - 8.1 g/dL - - -  Total Bilirubin 0.3 - 1.2 mg/dL - - -  Alkaline Phos 38 - 126 U/L - - -  AST 15 - 41 U/L - - -  ALT 0 - 44 U/L - - -   CBC Latest Ref Rng & Units 01/31/2019 01/30/2019 01/30/2019  WBC 4.0 - 10.5 K/uL 9.4 - -  Hemoglobin 12.0 - 15.0 g/dL 10.8(L) 12.2 11.2(L)  Hematocrit 36.0 - 46.0 % 33.3(L) 36.0 33.0(L)  Platelets 150 - 400 K/uL 176 - -   Lipid Panel  No results found for: CHOL, TRIG, HDL, CHOLHDL, VLDL, LDLCALC, LDLDIRECT HEMOGLOBIN A1C Lab Results  Component Value Date   HGBA1C 7.2 (H) 01/26/2019   MPG 159.94 01/26/2019   TSH No results for input(s): TSH in the last 8760 hours.  PRN Meds:. There are no discontinued medications. No  outpatient medications have been marked as taking for the 02/26/19 encounter (Appointment) with Miquel Dunn, NP.    Cardiac Studies:   Echocardiogram 06/06/2018: Left ventricle cavity is normal in size. Moderate concentric  hypertrophy of the left ventricle. Mild decrease in global wall motion. Indeterminate diastolic filling pattern. Calculated EF 51%. Left atrial cavity is moderately dilated. Moderate aortic valve leaflet calcification. Moderate to severe aortic valve stenosis. Aortic valve mean gradient of 36 mmHg, Vmax of 3.9 m/s. Calculated aortic valve area by continuity equation is 1.0 cm, with AVAi 0.44 cm2/m2. Moderate (Grade II) aortic regurgitation. Inadequate TR jet to estimate pulmoary artery systolic pressure. Normal right atrial pressure. No significant change compared to previous study on 08/27/2017.  TEE 08/31/2017: - Left ventricle: There was mild concentric hypertrophy. Systolic function was normal. The estimated ejection fraction was in the range of 55% to 60%. - Aortic valve: Trileaflet; moderately calcified leaflets. There was moderate stenosis. There was trivial regurgitation. Mean gradient (S): 32 mm Hg. Peak gradient (S): 65 mm Hg. Valve area (VTI): 0.78 cm^2. Valve area (Vmax): 1.04 cm^2. Valve area (Vmean): 0.96 cm^2. - Mitral valve: Mildly calcified annulus.  Lexiscan myoview stress test 09/16/2017: 1. Lexiscan stress test was performed. Exercise capacity was not assessed. Stress symptoms included dyspnea, dizziness and headache. Peak blood pressure 168/90 mmHg. Stress EKG is non diagnostic for ischemia as it is a pharmacologic stress. The stress electrocardiogram demonstrated sinus tachycardia, normal resting conduction, left ventricular hypertrophy, no resting arrhythmias and normal rest repolarization. 2. The overall quality of the study is good. Left ventricular cavity is noted to be normal on the rest and stress studies. Gated SPECT images  reveal normal myocardial thickening and wall motion. The left ventricular ejection fraction was calculated or visually estimated to be 69%. SPECT images demonstrate predominantly reversible, medium sized, mild intensity perfusion defect. This likely suggests LCx/RPDA territory ischemia.  3. Intermediate risk study.  Carotid artery duplex 08/26/2017: Minimal stenosis in the bilateral internal carotid artery (1-15%). No significant plaque.  Antegrade right vertebral artery flow. Antegrade left vertebral artery flow. F/U studies if clinically indicated. No significant change from 02/01/2015.  Right and left heart catheterization 10/04/2017: Hemodynamic data: Right heart catheterization: RA 5/3, mean 1 mmHg, RV 24/0, EDP 4 mmHg. PA 27/4, mean 50. PA saturation 64%. PW 12/16, mean 7 mmHg. Aortic saturation 98%. CO 4.67, CI 2.25, preserved.  Left heart catheterization hemodynamic data: LV 164/3, EDP 13 mmHg. Aortic pressure 105/52, mean 74 mmHg. Peak to peak pressure gradient of 27 mmHg, mean gradient 22.5 mm and valve area 0.99 cm.  Angiographic data: Normal coronary arteries, right dominant circulation.  Assessment:   No diagnosis found.   Recommendations:   Since last seen by me, she is continued to have worsening shortness of breath on exertion, her main complaint today is fatigue and worsening palpitations.  She has a history of palpitations that she calls tachycardia.  Appears to resolve with vagal maneuvers, suggestive of SVT.  I will place her on 48-hour Holter monitor for further evaluation.    Referral was placed at her last office visit to CV surgery for evaluation; however, she has not heard from their office.  I continue to feel that it may be worthwhile to get an opinion from CV surgery given her worsening symptoms. Last echo was 6 months ago, will repeat this for surveillance. Once echocardiogram is complete, I will reach back out to them for scheduling.  Blood  pressures remain well controlled.  No significant leg swelling, PND, or orthopnea.  Weight continues to be an issue and suspect may also be contributing to her symptoms.  Advised her to continue to be working on this with diet modifications particularly calorie  restriction.  I will plan tentatively plan to see her back in 3 months for close monitoring, but may need to make adjustments depending upon her evaluation with CV surgery.  Miquel Dunn, MSN, APRN, FNP-C [facility]. [place] Office: [phone] Fax: [phone]

## 2019-03-12 ENCOUNTER — Other Ambulatory Visit: Payer: Self-pay

## 2019-03-12 ENCOUNTER — Encounter: Payer: Self-pay | Admitting: Cardiology

## 2019-03-12 ENCOUNTER — Ambulatory Visit: Payer: Medicare Other | Admitting: Cardiology

## 2019-03-12 VITALS — BP 123/58 | HR 66 | Temp 97.5°F | Ht 62.0 in | Wt 239.0 lb

## 2019-03-12 DIAGNOSIS — Z952 Presence of prosthetic heart valve: Secondary | ICD-10-CM

## 2019-03-12 DIAGNOSIS — I10 Essential (primary) hypertension: Secondary | ICD-10-CM

## 2019-03-12 NOTE — Progress Notes (Signed)
Primary Physician:  Reita Cliche, MD   Patient ID: Kara Lee, female    DOB: 03-06-1946, 73 y.o.   MRN: [id number]  Subjective:    Chief Complaint  Patient presents with  . Aortic Stenosis    3 month follow up    HPI: Kara Lee  is a 73 y.o. 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 She now presents for follow up.   She has had post TAVR echo on 11/05 that showed EF 60%, normally functioning TAVR with mean gradient of 14 mm Hg (improved from 21 mm Hg on POD1 echo) and no PVL.  Since her procedure she has noticed improvement in dyspnea on exertion, energy, and dizziness.  States that she feels the best she has in long time.  She she continues to have occasional flip-flopping sensation, but not as bad as previous.  She has been walking up and down her stairs for exercise that she tolerates well.  She is debating starting cardiac rehab.  Her husband has recently completed treatement for HER-2 positive breast cancer and is now in remission.    Past Medical History:  Diagnosis Date  . Anxiety   . Arthritis   . Complication of anesthesia   . Concussion November 09, 2012   From a fall at home on deck  . Depression    postpartum depression after 2nd child  . Diabetes mellitus ([place])   . Fatty liver   . GERD (gastroesophageal reflux disease)   . HLD (hyperlipidemia)   . Morbid obesity ([place])   . OSA on CPAP   . Osteoarthritis of right knee 07/31/2013  . PONV (postoperative nausea and vomiting)   . Rosacea   . S/P TAVR (transcatheter aortic valve replacement)   . Severe aortic stenosis     Past Surgical History:  Procedure Laterality Date  . ABDOMINAL HYSTERECTOMY     still has uterus  . ANKLE RECONSTRUCTION Left   . BIOPSY BREAST Bilateral   . CESAREAN SECTION     x 2  . CHOLECYSTECTOMY    .  COLONOSCOPY    . EYE SURGERY     lasik; X3 for crossed eyes; 1 eyelid surgery  . HERNIA REPAIR    . PARTIAL KNEE ARTHROPLASTY Right 07/31/2013   Procedure: RIGHT UNICOMPARTMENTAL KNEE;  Surgeon: Johnny Bridge, MD;  Location: [facility];  Service: Orthopedics;  Laterality: Right;  . RIGHT/LEFT HEART CATH AND CORONARY ANGIOGRAPHY N/A 10/04/2017   Procedure: RIGHT/LEFT HEART CATH AND CORONARY ANGIOGRAPHY;  Surgeon: Adrian Prows, MD;  Location: [facility];  Service: Cardiovascular;  Laterality: N/A;  . TEE WITHOUT CARDIOVERSION N/A 08/31/2017   Procedure: TRANSESOPHAGEAL ECHOCARDIOGRAM (TEE);  Surgeon: Adrian Prows, MD;  Location: [facility];  Service: Cardiovascular;  Laterality: N/A;  . TEE WITHOUT CARDIOVERSION N/A 01/30/2019   Procedure: TRANSESOPHAGEAL ECHOCARDIOGRAM (TEE);  Surgeon: Sherren Mocha, MD;  Location: [facility];  Service: Open Heart Surgery;  Laterality: N/A;  . TRANSCATHETER AORTIC VALVE REPLACEMENT, TRANSFEMORAL N/A 01/30/2019   Procedure: TRANSCATHETER AORTIC VALVE REPLACEMENT, TRANSFEMORAL;  Surgeon: Sherren Mocha, MD;  Location: [facility];  Service: Open Heart Surgery;  Laterality: N/A;  . TUBAL LIGATION      Social History   Socioeconomic History  . Marital status: Married    Spouse name: Not on file  . Number of  children: 2  . Years of education: Not on file  . Highest education level: Not on file  Occupational History  . Not on file  Social Needs  . Financial resource strain: Not on file  . Food insecurity    Worry: Not on file    Inability: Not on file  . Transportation needs    Medical: Not on file    Non-medical: Not on file  Tobacco Use  . Smoking status: Never Smoker  . Smokeless tobacco: Never Used  Substance and Sexual Activity  . Alcohol use: Not Currently  . Drug use: No  . Sexual activity: Not on file  Lifestyle  . Physical activity    Days per week: Not on file    Minutes per session: Not on file  . Stress: Not on  file  Relationships  . Social Herbalist on phone: Not on file    Gets together: Not on file    Attends religious service: Not on file    Active member of club or organization: Not on file    Attends meetings of clubs or organizations: Not on file    Relationship status: Not on file  . Intimate partner violence    Fear of current or ex partner: Not on file    Emotionally abused: Not on file    Physically abused: Not on file    Forced sexual activity: Not on file  Other Topics Concern  . Not on file  Social History Narrative  . Not on file    Review of Systems  Constitution: Negative for decreased appetite, malaise/fatigue, weight gain and weight loss.  Eyes: Negative for visual disturbance.  Cardiovascular: Positive for palpitations. Negative for chest pain, claudication, dyspnea on exertion, leg swelling, orthopnea and syncope.  Respiratory: Negative for hemoptysis, sleep disturbances due to breathing and wheezing.   Endocrine: Negative for cold intolerance and heat intolerance.  Hematologic/Lymphatic: Negative for bleeding problem. Does not bruise/bleed easily.  Skin: Negative for nail changes.  Musculoskeletal: Negative for muscle weakness and myalgias.  Gastrointestinal: Negative for abdominal pain, change in bowel habit, nausea and vomiting.  Neurological: Negative for difficulty with concentration, dizziness, focal weakness and headaches.  Psychiatric/Behavioral: Negative for altered mental status and suicidal ideas.  All other systems reviewed and are negative.     Objective:  Blood pressure (!) 123/58, pulse 66, temperature (!) 97.5 F (36.4 C), height '5\' 2"'$  (1.575 m), weight 239 lb (108.4 kg), SpO2 96 %. Body mass index is 43.71 kg/m.   Physical Exam  Constitutional: She is oriented to person, place, and time. Vital signs are normal. She appears well-developed and well-nourished.  HENT:  Head: Normocephalic and atraumatic.  Neck: Normal range of motion.   Cardiovascular: Normal rate, regular rhythm and intact distal pulses.  Murmur heard.  Rumbling crescendo-decrescendo midsystolic murmur is present with a grade of 2/6 at the upper right sternal border radiating to the neck. Pulses:      Carotid pulses are on the right side with bruit and on the left side with bruit.      Femoral pulses are 1+ on the right side and 1+ on the left side.      Popliteal pulses are 1+ on the right side and 1+ on the left side.       Dorsalis pedis pulses are 2+ on the right side and 2+ on the left side.       Posterior tibial pulses are 0 on the  right side and 0 on the left side.  Pulmonary/Chest: Effort normal and breath sounds normal. No accessory muscle usage. No respiratory distress.  Abdominal: Soft. Bowel sounds are normal.  Musculoskeletal: Normal range of motion.  Neurological: She is alert and oriented to person, place, and time.  Skin: Skin is warm, dry and intact.  Vitals reviewed.  Radiology: No results found.  Laboratory examination:    CMP Latest Ref Rng & Units 01/31/2019 01/30/2019 01/30/2019  Glucose 70 - 99 mg/dL 142(H) 190(H) 188(H)  BUN 8 - 23 mg/dL '9 11 11  '$ Creatinine 0.44 - 1.00 mg/dL 0.81 0.40(L) 0.50  Sodium 135 - 145 mmol/L 138 141 142  Potassium 3.5 - 5.1 mmol/L 3.7 4.0 4.3  Chloride 98 - 111 mmol/L 105 108 108  CO2 22 - 32 mmol/L 22 - -  Calcium 8.9 - 10.3 mg/dL 8.8(L) - -  Total Protein 6.5 - 8.1 g/dL - - -  Total Bilirubin 0.3 - 1.2 mg/dL - - -  Alkaline Phos 38 - 126 U/L - - -  AST 15 - 41 U/L - - -  ALT 0 - 44 U/L - - -   CBC Latest Ref Rng & Units 01/31/2019 01/30/2019 01/30/2019  WBC 4.0 - 10.5 K/uL 9.4 - -  Hemoglobin 12.0 - 15.0 g/dL 10.8(L) 12.2 11.2(L)  Hematocrit 36.0 - 46.0 % 33.3(L) 36.0 33.0(L)  Platelets 150 - 400 K/uL 176 - -   Lipid Panel  No results found for: CHOL, TRIG, HDL, CHOLHDL, VLDL, LDLCALC, LDLDIRECT HEMOGLOBIN A1C Lab Results  Component Value Date   HGBA1C 7.2 (H) 01/26/2019   MPG  159.94 01/26/2019   TSH No results for input(s): TSH in the last 8760 hours.  PRN Meds:. There are no discontinued medications. Current Meds  Medication Sig  . acetaminophen (TYLENOL) 650 MG CR tablet Take 1,300 mg by mouth every 8 (eight) hours as needed for pain.  Marland Kitchen amoxicillin (AMOXIL) 500 MG capsule Take 4 capsules (2,000 mg total) by mouth See admin instructions. Take 2000 mg 1 hour prior to dental work  . Ascorbic Acid (VITAMIN C PO) Take 1 tablet by mouth daily as needed (immune support).  Marland Kitchen aspirin EC 81 MG tablet Take 81 mg by mouth at bedtime.   Marland Kitchen atenolol (TENORMIN) 25 MG tablet Take 25 mg by mouth 2 (two) times daily.  . clonazePAM (KLONOPIN) 0.5 MG tablet Take 0.5 mg by mouth 2 (two) times daily.  . clopidogrel (PLAVIX) 75 MG tablet Take 1 tablet (75 mg total) by mouth daily with breakfast.  . diphenhydrAMINE (BENADRYL ALLERGY) 25 mg capsule Take 25 mg by mouth as needed.  Marland Kitchen estradiol (ESTRACE) 0.1 MG/GM vaginal cream Place 1 Applicatorful vaginally as needed (irritation).   Marland Kitchen FLUoxetine (PROZAC) 40 MG capsule Take 40 mg by mouth daily.  . fluticasone (FLONASE) 50 MCG/ACT nasal spray Place 1 spray into both nostrils daily as needed for allergies.   . hydrocortisone (ANUSOL-HC) 2.5 % rectal cream Place 1 application rectally daily as needed for hemorrhoids.   . metFORMIN (GLUCOPHAGE-XR) 500 MG 24 hr tablet Take 1 tablet (500 mg total) by mouth 2 (two) times daily.  . pantoprazole (PROTONIX) 40 MG tablet Take 1 tablet (40 mg total) by mouth daily.  . Pitavastatin Calcium (LIVALO) 2 MG TABS Take 2 mg by mouth at bedtime.  Marland Kitchen Propylene Glycol (SYSTANE BALANCE) 0.6 % SOLN Place 1 drop into both eyes daily as needed (dry eyes).    Cardiac Studies:   Echo  02/22/2019: 1. Left ventricular ejection fraction, by visual estimation, is 60 to 65%. The left ventricle has normal function. There is no left ventricular hypertrophy.  2. Left ventricular diastolic parameters are consistent  with Grade II diastolic dysfunction (pseudonormalization).  3. Elevated left ventricular end-diastolic pressure.  4. Global right ventricle has normal systolic function.The right ventricular size is normal. No increase in right ventricular wall thickness.  5. Left atrial size was moderately dilated.  6. Right atrial size was mildly dilated.  7. Severe mitral annular calcification.  8. The mitral valve is normal in structure. No evidence of mitral valve regurgitation. No evidence of mitral stenosis.  9. The tricuspid valve is normal in structure. Tricuspid valve regurgitation is not demonstrated. 10. 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 Aortic valve mean gradient measures 14.0 mmHg. Aortic valve peak gradient measures 27.0 mmHg. Aortic valve area, by VTI measures 1.36 cm. 11. The pulmonic valve was normal in structure. Pulmonic valve regurgitation is not visualized. 12. The inferior vena cava is normal in size with greater than 50% respiratory variability, suggesting right atrial pressure of 3 mmHg.  TEE 08/31/2017: - Left ventricle: There was mild concentric hypertrophy. Systolic function was normal. The estimated ejection fraction was in the range of 55% to 60%. - Aortic valve: Trileaflet; moderately calcified leaflets. There was moderate stenosis. There was trivial regurgitation. Mean gradient (S): 32 mm Hg. Peak gradient (S): 65 mm Hg. Valve area (VTI): 0.78 cm^2. Valve area (Vmax): 1.04 cm^2. Valve area (Vmean): 0.96 cm^2. - Mitral valve: Mildly calcified annulus.  Lexiscan myoview stress test 09/16/2017: 1. Lexiscan stress test was performed. Exercise capacity was not assessed. Stress symptoms included dyspnea, dizziness and headache. Peak blood pressure 168/90 mmHg. Stress EKG is non diagnostic for ischemia as it is a pharmacologic stress. The stress electrocardiogram demonstrated sinus tachycardia, normal resting  conduction, left ventricular hypertrophy, no resting arrhythmias and normal rest repolarization. 2. The overall quality of the study is good. Left ventricular cavity is noted to be normal on the rest and stress studies. Gated SPECT images reveal normal myocardial thickening and wall motion. The left ventricular ejection fraction was calculated or visually estimated to be 69%. SPECT images demonstrate predominantly reversible, medium sized, mild intensity perfusion defect. This likely suggests LCx/RPDA territory ischemia.  3. Intermediate risk study.  Carotid artery duplex 08/26/2017: Minimal stenosis in the bilateral internal carotid artery (1-15%). No significant plaque.  Antegrade right vertebral artery flow. Antegrade left vertebral artery flow. F/U studies if clinically indicated. No significant change from 02/01/2015.  Right and left heart catheterization 10/04/2017: Hemodynamic data: Right heart catheterization: RA 5/3, mean 1 mmHg, RV 24/0, EDP 4 mmHg. PA 27/4, mean 50. PA saturation 64%. PW 12/16, mean 7 mmHg. Aortic saturation 98%. CO 4.67, CI 2.25, preserved.  Left heart catheterization hemodynamic data: LV 164/3, EDP 13 mmHg. Aortic pressure 105/52, mean 74 mmHg. Peak to peak pressure gradient of 27 mmHg, mean gradient 22.5 mm and valve area 0.99 cm.  Angiographic data: Normal coronary arteries, right dominant circulation.  Assessment:     ICD-10-CM   1. Essential hypertension  I10   2. S/P TAVR (transcatheter aortic valve replacement) Transcatheter Aortic Valve Replacement - Percutaneous Transfemoral Approach Edwards Sapien 3 Ultra THV (size 26 mm ) 01/30/2019  Z95.2   3. Morbid obesity ([place])  E66.01      Recommendations:   Patient is doing well post TAVR.  She has had significant improvement in her  symptoms and is feeling that she has in a long time. No evidence of heart failure. No dizziness or near syncope. She is on aspirin and Plavix which she will  continue until April 2021.  Post TAVR echocardiogram was stable.  She will have repeat echocardiogram in 1 year and follow-up with CV surgery at that time.  I have encouraged her to participate in cardiac rehab either virtually or in person.  She will need to continue to work on weight loss, which was discussed today.  Her blood pressure is well controlled, no changes were made to medications today.  We will plan to see her back in 6 months, but encouraged her to contact us sooner if needed.  Miquel Dunn, MSN, APRN, FNP-C [facility]. [place] Office: [phone] Fax: [phone]

## 2019-03-14 ENCOUNTER — Encounter (HOSPITAL_COMMUNITY)
Admission: RE | Admit: 2019-03-14 | Discharge: 2019-03-14 | Disposition: A | Discharge status: Home / Self Care | Payer: Self-pay | Source: Ambulatory Visit | Attending: Cardiology | Admitting: Cardiology

## 2019-03-14 ENCOUNTER — Other Ambulatory Visit: Payer: Self-pay

## 2019-03-14 ENCOUNTER — Telehealth (HOSPITAL_COMMUNITY): Payer: Self-pay

## 2019-03-14 NOTE — Telephone Encounter (Signed)

## 2019-03-14 NOTE — Telephone Encounter (Signed)
Called and spoke with pt in regards to Cardiac/Virtual Rehab, pt stated she would like to only do our VCR program for now due to COVID-19.  Pt is interested in participating in Virtual Cardiac and Pulmonary Rehab. Pt advised that Virtual Cardiac and Pulmonary Rehab is provided at no cost to the patient.  Checklist:  1. Pt has smart device  ie smartphone and/or ipad for downloading an app  Yes 2. Reliable internet/wifi service    Yes 3. Understands how to use their smartphone and navigate within an app.  Yes   Pt verbalized understanding and is in agreement.

## 2019-03-21 ENCOUNTER — Other Ambulatory Visit: Payer: Self-pay

## 2019-03-21 ENCOUNTER — Telehealth: Payer: Self-pay | Admitting: Physician Assistant

## 2019-03-21 ENCOUNTER — Telehealth: Payer: Self-pay | Admitting: Cardiology

## 2019-03-21 DIAGNOSIS — I10 Essential (primary) hypertension: Secondary | ICD-10-CM

## 2019-03-21 MED ORDER — LOSARTAN POTASSIUM 25 MG PO TABS: 25.0000 mg | ORAL_TABLET | Freq: Every day | ORAL | 3 refills | Status: DC

## 2019-03-21 NOTE — Telephone Encounter (Signed)
  [place] VALVE TEAM  Patient called structural heart office to report elevated BPs at home and occasional palpitations like her heart is "flip flopping." I told her I would reach out to her primary team (Dr. Einar Gip and Jeri Lager NP) to take over in management of above issues. I let her know that someone from their office would be in touch.  [name] PA-C  [facility]

## 2019-03-21 NOTE — Telephone Encounter (Signed)
Pt says her palps comes and goes she has had it for years; She is more concerned about her BP [id number] systolic and she is usually 120; when her bp is high she has no symptoms she is just concerned about it. She says its been up since she was in the hosp for the heart valve

## 2019-03-21 NOTE — Telephone Encounter (Signed)
Thanks, we will reach out to her.

## 2019-03-21 NOTE — Telephone Encounter (Signed)
Pt aware.

## 2019-03-21 NOTE — Telephone Encounter (Signed)
Have her start Losartan 25 mg daily. She will need BMP in 10 days and can schedule virtual visit with me in 4 weeks

## 2019-03-26 NOTE — Telephone Encounter (Signed)
Okay she can hold off for now and just let us know if starts to elevate again

## 2019-03-26 NOTE — Telephone Encounter (Signed)
t called and said that she has not started the losartan because her blood pressures have been good at home. 133/80, 118/70, 140/78. She does not want to add an additional med if its not necessary. She also has not gone for her labs since she isn't taking the medicine.

## 2019-04-10 ENCOUNTER — Encounter (HOSPITAL_COMMUNITY)
Admission: RE | Admit: 2019-04-10 | Discharge: 2019-04-10 | Disposition: A | Discharge status: Home / Self Care | Payer: Medicare Other | Source: Ambulatory Visit | Attending: Cardiology | Admitting: Cardiology

## 2019-04-10 ENCOUNTER — Telehealth (HOSPITAL_COMMUNITY): Payer: Self-pay | Admitting: *Deleted

## 2019-04-10 NOTE — Telephone Encounter (Signed)
Spoke with the patient interested in meeting with the dietitian in person. Will send dietary survey in the mail for patient to fill out. Patient says she is doing good with walking. Will continue to monitor on the Virtual Cardiac Rehab APP. Barnet Pall, RN,BSN 04/10/2019 9:11 AM

## 2019-04-19 ENCOUNTER — Telehealth: Payer: Medicare Other | Admitting: Cardiology

## 2019-04-19 NOTE — Telephone Encounter (Signed)
Called pt per AK to see how her readings have been. Pt running 120's-140's, bottom # normal, and is feeling ok. AK said she is fine with this as long as bp doesn't consistently start running in the 140's then if so, pt is to call us back. Otherwise ok to cancel VV today and keep regular 44mo f/u.//ah

## 2019-04-19 NOTE — Progress Notes (Deleted)
Primary Physician:  Reita Cliche, MD   Patient ID: Kara Lee, female    DOB: 06-Jun-1945, 73 y.o.   MRN: [id number]  Subjective:    No chief complaint on file.   HPI: [name]  is a 73 y.o. 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 She now presents for follow up.   She has had post TAVR echo on 11/05 that showed EF 60%, normally functioning TAVR with mean gradient of 14 mm Hg (improved from 21 mm Hg on POD1 echo) and no PVL.  Since her procedure she has noticed improvement in dyspnea on exertion, energy, and dizziness.  States that she feels the best she has in long time.  She she continues to have occasional flip-flopping sensation, but not as bad as previous.  She has been walking up and down her stairs for exercise that she tolerates well.  She is debating starting cardiac rehab.  Her husband has recently completed treatement for HER-2 positive breast cancer and is now in remission.    Past Medical History:  Diagnosis Date  . Anxiety   . Arthritis   . Complication of anesthesia   . Concussion November 09, 2012   From a fall at home on deck  . Depression    postpartum depression after 2nd child  . Diabetes mellitus ([place])   . Fatty liver   . GERD (gastroesophageal reflux disease)   . HLD (hyperlipidemia)   . Morbid obesity ([place])   . OSA on CPAP   . Osteoarthritis of right knee 07/31/2013  . PONV (postoperative nausea and vomiting)   . Rosacea   . S/P TAVR (transcatheter aortic valve replacement)   . Severe aortic stenosis     Past Surgical History:  Procedure Laterality Date  . ABDOMINAL HYSTERECTOMY     still has uterus  . ANKLE RECONSTRUCTION Left   . BIOPSY BREAST Bilateral   . CESAREAN SECTION     x 2  . CHOLECYSTECTOMY    . COLONOSCOPY    . EYE SURGERY     lasik; X3 for crossed eyes; 1  eyelid surgery  . HERNIA REPAIR    . PARTIAL KNEE ARTHROPLASTY Right 07/31/2013   Procedure: RIGHT UNICOMPARTMENTAL KNEE;  Surgeon: Johnny Bridge, MD;  Location: [facility];  Service: Orthopedics;  Laterality: Right;  . RIGHT/LEFT HEART CATH AND CORONARY ANGIOGRAPHY N/A 10/04/2017   Procedure: RIGHT/LEFT HEART CATH AND CORONARY ANGIOGRAPHY;  Surgeon: Adrian Prows, MD;  Location: [facility];  Service: Cardiovascular;  Laterality: N/A;  . TEE WITHOUT CARDIOVERSION N/A 08/31/2017   Procedure: TRANSESOPHAGEAL ECHOCARDIOGRAM (TEE);  Surgeon: Adrian Prows, MD;  Location: [facility];  Service: Cardiovascular;  Laterality: N/A;  . TEE WITHOUT CARDIOVERSION N/A 01/30/2019   Procedure: TRANSESOPHAGEAL ECHOCARDIOGRAM (TEE);  Surgeon: Sherren Mocha, MD;  Location: [facility];  Service: Open Heart Surgery;  Laterality: N/A;  . TRANSCATHETER AORTIC VALVE REPLACEMENT, TRANSFEMORAL N/A 01/30/2019   Procedure: TRANSCATHETER AORTIC VALVE REPLACEMENT, TRANSFEMORAL;  Surgeon: Sherren Mocha, MD;  Location: [facility];  Service: Open Heart Surgery;  Laterality: N/A;  . TUBAL LIGATION      Social History   Socioeconomic History  . Marital status: Married    Spouse name: Not on file  . Number of children: 2  . Years of education: Not on file  . Highest  education level: Not on file  Occupational History  . Not on file  Tobacco Use  . Smoking status: Never Smoker  . Smokeless tobacco: Never Used  Substance and Sexual Activity  . Alcohol use: Not Currently  . Drug use: No  . Sexual activity: Not on file  Other Topics Concern  . Not on file  Social History Narrative  . Not on file   Social Determinants of Health   Financial Resource Strain:   . Difficulty of Paying Living Expenses: Not on file  Food Insecurity:   . Worried About Charity fundraiser in the Last Year: Not on file  . Ran Out of Food in the Last Year: Not on file  Transportation Needs:   . Lack of Transportation  (Medical): Not on file  . Lack of Transportation (Non-Medical): Not on file  Physical Activity:   . Days of Exercise per Week: Not on file  . Minutes of Exercise per Session: Not on file  Stress:   . Feeling of Stress : Not on file  Social Connections:   . Frequency of Communication with Friends and Family: Not on file  . Frequency of Social Gatherings with Friends and Family: Not on file  . Attends Religious Services: Not on file  . Active Member of Clubs or Organizations: Not on file  . Attends Archivist Meetings: Not on file  . Marital Status: Not on file  Intimate Partner Violence:   . Fear of Current or Ex-Partner: Not on file  . Emotionally Abused: Not on file  . Physically Abused: Not on file  . Sexually Abused: Not on file    Review of Systems  Constitution: Negative for decreased appetite, malaise/fatigue, weight gain and weight loss.  Eyes: Negative for visual disturbance.  Cardiovascular: Positive for palpitations. Negative for chest pain, claudication, dyspnea on exertion, leg swelling, orthopnea and syncope.  Respiratory: Negative for hemoptysis, sleep disturbances due to breathing and wheezing.   Endocrine: Negative for cold intolerance and heat intolerance.  Hematologic/Lymphatic: Negative for bleeding problem. Does not bruise/bleed easily.  Skin: Negative for nail changes.  Musculoskeletal: Negative for muscle weakness and myalgias.  Gastrointestinal: Negative for abdominal pain, change in bowel habit, nausea and vomiting.  Neurological: Negative for difficulty with concentration, dizziness, focal weakness and headaches.  Psychiatric/Behavioral: Negative for altered mental status and suicidal ideas.  All other systems reviewed and are negative.     Objective:  There were no vitals taken for this visit. There is no height or weight on file to calculate BMI.   Physical Exam  Constitutional: She is oriented to person, place, and time. Vital signs are  normal. She appears well-developed and well-nourished.  HENT:  Head: Normocephalic and atraumatic.  Cardiovascular: Normal rate, regular rhythm and intact distal pulses.  Murmur heard.  Rumbling crescendo-decrescendo midsystolic murmur is present with a grade of 2/6 at the upper right sternal border radiating to the neck. Pulses:      Carotid pulses are on the right side with bruit and on the left side with bruit.      Femoral pulses are 1+ on the right side and 1+ on the left side.      Popliteal pulses are 1+ on the right side and 1+ on the left side.       Dorsalis pedis pulses are 2+ on the right side and 2+ on the left side.       Posterior tibial pulses are 0 on the right  side and 0 on the left side.  Pulmonary/Chest: Effort normal and breath sounds normal. No accessory muscle usage. No respiratory distress.  Abdominal: Soft. Bowel sounds are normal.  Musculoskeletal:        General: Normal range of motion.     Cervical back: Normal range of motion.  Neurological: She is alert and oriented to person, place, and time.  Skin: Skin is warm, dry and intact.  Vitals reviewed.  Radiology: No results found.  Laboratory examination:    CMP Latest Ref Rng & Units 01/31/2019 01/30/2019 01/30/2019  Glucose 70 - 99 mg/dL 142(H) 190(H) 188(H)  BUN 8 - 23 mg/dL '9 11 11  '$ Creatinine 0.44 - 1.00 mg/dL 0.81 0.40(L) 0.50  Sodium 135 - 145 mmol/L 138 141 142  Potassium 3.5 - 5.1 mmol/L 3.7 4.0 4.3  Chloride 98 - 111 mmol/L 105 108 108  CO2 22 - 32 mmol/L 22 - -  Calcium 8.9 - 10.3 mg/dL 8.8(L) - -  Total Protein 6.5 - 8.1 g/dL - - -  Total Bilirubin 0.3 - 1.2 mg/dL - - -  Alkaline Phos 38 - 126 U/L - - -  AST 15 - 41 U/L - - -  ALT 0 - 44 U/L - - -   CBC Latest Ref Rng & Units 01/31/2019 01/30/2019 01/30/2019  WBC 4.0 - 10.5 K/uL 9.4 - -  Hemoglobin 12.0 - 15.0 g/dL 10.8(L) 12.2 11.2(L)  Hematocrit 36.0 - 46.0 % 33.3(L) 36.0 33.0(L)  Platelets 150 - 400 K/uL 176 - -   Lipid Panel    No results found for: CHOL, TRIG, HDL, CHOLHDL, VLDL, LDLCALC, LDLDIRECT HEMOGLOBIN A1C Lab Results  Component Value Date   HGBA1C 7.2 (H) 01/26/2019   MPG 159.94 01/26/2019   TSH No results for input(s): TSH in the last 8760 hours.  PRN Meds:. There are no discontinued medications. No outpatient medications have been marked as taking for the 04/19/19 encounter (Appointment) with Miquel Dunn, NP.    Cardiac Studies:   Echo 02/22/2019: 1. Left ventricular ejection fraction, by visual estimation, is 60 to 65%. The left ventricle has normal function. There is no left ventricular hypertrophy.  2. Left ventricular diastolic parameters are consistent with Grade II diastolic dysfunction (pseudonormalization).  3. Elevated left ventricular end-diastolic pressure.  4. Global right ventricle has normal systolic function.The right ventricular size is normal. No increase in right ventricular wall thickness.  5. Left atrial size was moderately dilated.  6. Right atrial size was mildly dilated.  7. Severe mitral annular calcification.  8. The mitral valve is normal in structure. No evidence of mitral valve regurgitation. No evidence of mitral stenosis.  9. The tricuspid valve is normal in structure. Tricuspid valve regurgitation is not demonstrated. 10. 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 Aortic valve mean gradient measures 14.0 mmHg. Aortic valve peak gradient measures 27.0 mmHg. Aortic valve area, by VTI measures 1.36 cm. 11. The pulmonic valve was normal in structure. Pulmonic valve regurgitation is not visualized. 12. The inferior vena cava is normal in size with greater than 50% respiratory variability, suggesting right atrial pressure of 3 mmHg.  TEE 08/31/2017: - Left ventricle: There was mild concentric hypertrophy. Systolic function was normal. The estimated ejection fraction was in the range of 55% to  60%. - Aortic valve: Trileaflet; moderately calcified leaflets. There was moderate stenosis. There was trivial regurgitation. Mean gradient (S): 32 mm Hg. Peak gradient (S): 65 mm Hg.  Valve area (VTI): 0.78 cm^2. Valve area (Vmax): 1.04 cm^2. Valve area (Vmean): 0.96 cm^2. - Mitral valve: Mildly calcified annulus.  Lexiscan myoview stress test 09/16/2017: 1. Lexiscan stress test was performed. Exercise capacity was not assessed. Stress symptoms included dyspnea, dizziness and headache. Peak blood pressure 168/90 mmHg. Stress EKG is non diagnostic for ischemia as it is a pharmacologic stress. The stress electrocardiogram demonstrated sinus tachycardia, normal resting conduction, left ventricular hypertrophy, no resting arrhythmias and normal rest repolarization. 2. The overall quality of the study is good. Left ventricular cavity is noted to be normal on the rest and stress studies. Gated SPECT images reveal normal myocardial thickening and wall motion. The left ventricular ejection fraction was calculated or visually estimated to be 69%. SPECT images demonstrate predominantly reversible, medium sized, mild intensity perfusion defect. This likely suggests LCx/RPDA territory ischemia.  3. Intermediate risk study.  Carotid artery duplex 08/26/2017: Minimal stenosis in the bilateral internal carotid artery (1-15%). No significant plaque.  Antegrade right vertebral artery flow. Antegrade left vertebral artery flow. F/U studies if clinically indicated. No significant change from 02/01/2015.  Right and left heart catheterization 10/04/2017: Hemodynamic data: Right heart catheterization: RA 5/3, mean 1 mmHg, RV 24/0, EDP 4 mmHg. PA 27/4, mean 50. PA saturation 64%. PW 12/16, mean 7 mmHg. Aortic saturation 98%. CO 4.67, CI 2.25, preserved.  Left heart catheterization hemodynamic data: LV 164/3, EDP 13 mmHg. Aortic pressure 105/52, mean 74 mmHg. Peak to peak pressure gradient of 27  mmHg, mean gradient 22.5 mm and valve area 0.99 cm.  Angiographic data: Normal coronary arteries, right dominant circulation.  Assessment:   No diagnosis found.   Recommendations:   Patient is doing well post TAVR.  She has had significant improvement in her symptoms and is feeling that she has in a long time. No evidence of heart failure. No dizziness or near syncope. She is on aspirin and Plavix which she will continue until April 2021.  Post TAVR echocardiogram was stable.  She will have repeat echocardiogram in 1 year and follow-up with CV surgery at that time.  I have encouraged her to participate in cardiac rehab either virtually or in person.  She will need to continue to work on weight loss, which was discussed today.  Her blood pressure is well controlled, no changes were made to medications today.  We will plan to see her back in 6 months, but encouraged her to contact us sooner if needed.  Miquel Dunn, MSN, APRN, FNP-C [facility]. [place] Office: [phone] Fax: [phone]

## 2019-04-27 ENCOUNTER — Encounter (HOSPITAL_COMMUNITY)
Admission: RE | Admit: 2019-04-27 | Discharge: 2019-04-27 | Disposition: A | Discharge status: Home / Self Care | Payer: Self-pay | Source: Ambulatory Visit | Attending: Cardiovascular Disease | Admitting: Cardiovascular Disease

## 2019-04-27 ENCOUNTER — Telehealth (HOSPITAL_COMMUNITY): Payer: Self-pay

## 2019-04-27 ENCOUNTER — Other Ambulatory Visit: Payer: Self-pay

## 2019-04-27 NOTE — Progress Notes (Signed)
Cardiac Rehab Note: Incoming call from [name] in response to previously left message earlier today. Ms. Askelson states she is doing well and continues to walk for 1 1/4 mile 3 days a week. She states prior to her TAVR, she had extreme difficulty walking in her neighborhood secondary to shortness of breath. SOB is now completely resolved. VSS. She admits to moderate anxiety and stress secondary to Covid pandemic and recent events at the [employer]. She has stopped watching the news, participating in social media, and engaged more in her hobby of reading. This has helped her anxiety over the last few days. She acknowledges social support from her daughter and son but they are not local. She continues to leave her home only for necessities including pharmacy, groceries, and medical appointments. She practices the 3 Ws at all times. Ms. Sielaff is appreciative of the telephone follow up.  Plan: Will follow up within 2 weeks. Patient provided CR RN contact information  Norberto Wishon E. Laray Anger, BSN

## 2019-04-27 NOTE — Telephone Encounter (Signed)
Cardiac Rehab Note: Unsuccessful telephone encounter to [name] to follow up on cardiac rehab plan of care and home exercise. HIPAA compliant VM message left requesting call back to [phone].  Plan: Will await call back. Follow up in 1 week  Tanicia Wolaver E. Laray Anger, BSN

## 2019-05-16 ENCOUNTER — Other Ambulatory Visit: Payer: Self-pay

## 2019-05-16 ENCOUNTER — Encounter (HOSPITAL_COMMUNITY)
Admission: RE | Admit: 2019-05-16 | Discharge: 2019-05-16 | Disposition: A | Discharge status: Home / Self Care | Payer: Self-pay | Source: Ambulatory Visit | Attending: Cardiology | Admitting: Cardiology

## 2019-05-16 NOTE — Progress Notes (Signed)
Virtual Cardiac Rehab Note:  Successful telephone follow up to Ms. Kara Lee to follow up on compliance with Cardiac Rehab plan of care and home exercise. Ms. 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 Ms. Kara Lee states she has had this chronic pain "for years" however it has recently gotten worse. She is unsure if it is related to her quality of sleep as her CPAP causes her frequent nighttime headaches that travel down her spine. She has not seen her OSA provider in some time and is encouraged to contact to discuss ASAP. She is also encouraged to discuss her chronic pain with her orthopedic provider. She is taking her medications as prescribed and trying to adhere to a heart healthy diet. She and husband are scheduled to receive their first dose Covid-19 vaccine tomorrow and she is looking forward to that appointment. She continues to take her BP and reports SBP 120-140, DBP 70s. Encouraged Ms. Kara Lee to remain as active as her pain will allow.  Plan: Will follow up with patient in 2 weeks  Andrienne Havener E. Rollene Rotunda RN, BSN [place]. [facility] Cardiac and Pulmonary Rehab [place] Direct: [phone]

## 2019-05-31 ENCOUNTER — Ambulatory Visit: Payer: Medicare Other

## 2019-06-15 ENCOUNTER — Ambulatory Visit: Payer: Medicare Other

## 2019-06-18 ENCOUNTER — Telehealth (HOSPITAL_COMMUNITY): Payer: Self-pay

## 2019-06-18 NOTE — Telephone Encounter (Signed)
Virtual Cardiac Rehab Note:  Unsuccessful telephone encounter to follow up with Kara Lee post completion of the [facility] virtual cardiac rehab program. HIPAA compliant VM message left requesting call back to [phone].  Alfreddie Consalvo E. Rollene Rotunda RN, BSN . [facility]  Cardiac and Pulmonary Rehabilitation [place] Direct: [phone]

## 2019-06-19 ENCOUNTER — Encounter (HOSPITAL_COMMUNITY): Payer: Self-pay

## 2019-06-19 NOTE — Progress Notes (Signed)
Virtual Cardiac Rehab Note:  Ms. Kara Lee has been discharged after completion of the [facility] Cardiac Rehab program. She was enrolled from 03/14/19 until 06/11/19 and successfully logged her exercise and vitals weekly. She has been instructed to contact the cardiac rehab team if additional questions, concerns, or needs should arise related to her exercise plan post discharge. It has been a pleasure working with Ms. Girtha Rm.  Stephen Turnbaugh E. Rollene Rotunda RN, BSN [place]. [facility]  Cardiac and Pulmonary Rehabilitation [place] Direct: [phone]

## 2019-07-06 ENCOUNTER — Other Ambulatory Visit: Payer: Self-pay | Admitting: Cardiology

## 2019-08-07 ENCOUNTER — Other Ambulatory Visit: Payer: Self-pay | Admitting: Cardiology

## 2019-09-07 ENCOUNTER — Ambulatory Visit: Payer: Medicare Other | Admitting: Family Medicine

## 2019-09-10 ENCOUNTER — Ambulatory Visit: Payer: Medicare Other | Admitting: Cardiology

## 2019-09-10 ENCOUNTER — Encounter: Payer: Self-pay | Admitting: Cardiology

## 2019-09-10 ENCOUNTER — Other Ambulatory Visit: Payer: Self-pay

## 2019-09-10 VITALS — BP 132/67 | HR 88 | Temp 98.1°F | Resp 16 | Ht 62.0 in | Wt 242.0 lb

## 2019-09-10 DIAGNOSIS — E119 Type 2 diabetes mellitus without complications: Secondary | ICD-10-CM

## 2019-09-10 DIAGNOSIS — E7800 Pure hypercholesterolemia, unspecified: Secondary | ICD-10-CM

## 2019-09-10 DIAGNOSIS — Z952 Presence of prosthetic heart valve: Secondary | ICD-10-CM

## 2019-09-10 DIAGNOSIS — I10 Essential (primary) hypertension: Secondary | ICD-10-CM

## 2019-09-10 MED ORDER — LIVALO 4 MG PO TABS: 4.0000 mg | ORAL_TABLET | Freq: Every day | ORAL | 6 refills | Status: DC

## 2019-09-10 NOTE — Progress Notes (Signed)
Primary Physician/Referring:  Reita Cliche, MD  Patient ID: Kara Lee, female    DOB: Mar 26, 1946, 74 y.o.   MRN: [id number]  Chief Complaint  Patient presents with  . Follow-up    6 month-  . Transcatheter aortic valve replacement   HPI:    Kara Lee  is a 74 y.o. 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 She now presents for follow up.   Since her procedure she has noticed improvement in dyspnea on exertion, energy, and dizziness.  States that she feels the best she has in long time.  Having hard time to loose weight. Husband present.   Past Medical History:  Diagnosis Date  . Anxiety   . Arthritis   . Complication of anesthesia   . Concussion November 09, 2012   From a fall at home on deck  . Depression    postpartum depression after 2nd child  . Diabetes mellitus ([place])   . Fatty liver   . GERD (gastroesophageal reflux disease)   . HLD (hyperlipidemia)   . Morbid obesity ([place])   . OSA on CPAP   . Osteoarthritis of right knee 07/31/2013  . PONV (postoperative nausea and vomiting)   . Rosacea   . S/P TAVR (transcatheter aortic valve replacement)   . Severe aortic stenosis    Past Surgical History:  Procedure Laterality Date  . ABDOMINAL HYSTERECTOMY     still has uterus  . ANKLE RECONSTRUCTION Left   . BIOPSY BREAST Bilateral   . CESAREAN SECTION     x 2  . CHOLECYSTECTOMY    . COLONOSCOPY    . EYE SURGERY     lasik; X3 for crossed eyes; 1 eyelid surgery  . HERNIA REPAIR    . PARTIAL KNEE ARTHROPLASTY Right 07/31/2013   Procedure: RIGHT UNICOMPARTMENTAL KNEE;  Surgeon: Johnny Bridge, MD;  Location: [place];  Service: Orthopedics;  Laterality: Right;  . RIGHT/LEFT HEART CATH AND CORONARY ANGIOGRAPHY N/A 10/04/2017   Procedure: RIGHT/LEFT HEART CATH AND CORONARY ANGIOGRAPHY;  Surgeon: Adrian Prows,  MD;  Location: [facility];  Service: Cardiovascular;  Laterality: N/A;  . TEE WITHOUT CARDIOVERSION N/A 08/31/2017   Procedure: TRANSESOPHAGEAL ECHOCARDIOGRAM (TEE);  Surgeon: Adrian Prows, MD;  Location: [place];  Service: Cardiovascular;  Laterality: N/A;  . TEE WITHOUT CARDIOVERSION N/A 01/30/2019   Procedure: TRANSESOPHAGEAL ECHOCARDIOGRAM (TEE);  Surgeon: Sherren Mocha, MD;  Location: [facility];  Service: Open Heart Surgery;  Laterality: N/A;  . TRANSCATHETER AORTIC VALVE REPLACEMENT, TRANSFEMORAL N/A 01/30/2019   Procedure: TRANSCATHETER AORTIC VALVE REPLACEMENT, TRANSFEMORAL;  Surgeon: Sherren Mocha, MD;  Location: [facility];  Service: Open Heart Surgery;  Laterality: N/A;  . TUBAL LIGATION     Family History  Problem Relation Age of Onset  . Pancreatitis Mother   . COPD Mother   . Cancer - Lung Mother   . Diabetes type II Mother     Social History   Tobacco Use  . Smoking status: Never Smoker  . Smokeless tobacco: Never Used  Substance Use Topics  . Alcohol use: Not Currently   Marital Status: Married  ROS  Review of Systems  Cardiovascular: Positive for dyspnea on exertion (mild). Negative for leg swelling and syncope.  Respiratory: Negative for shortness of breath.   Gastrointestinal: Negative for melena.   Objective  There  were no vitals taken for this visit.  Vitals with BMI 03/12/2019 02/22/2019 02/07/2019  Height 5\' 2"  5\' 2"  5\' 2"   Weight 239 lbs 240 lbs 13 oz 238 lbs  BMI 43.7 [id number]  Systolic [id number]  Diastolic 58 64 86  Pulse 66 69 74     Physical Exam  Constitutional: Vital signs are normal. She appears well-developed and well-nourished. No distress.  HENT:  Head: Normocephalic and atraumatic.  Cardiovascular: Normal rate, regular rhythm and intact distal pulses. Exam reveals no gallop.  Murmur heard.  Early systolic murmur is present with a grade of 1/6 at the upper right sternal border. Pulses:      Carotid  pulses are on the right side with bruit and on the left side with bruit.      Femoral pulses are 1+ on the right side and 1+ on the left side.      Popliteal pulses are 1+ on the right side and 1+ on the left side.       Dorsalis pedis pulses are 2+ on the right side and 2+ on the left side.       Posterior tibial pulses are 0 on the right side and 0 on the left side.  No leg edema, no JVD.   Pulmonary/Chest: Effort normal and breath sounds normal. No accessory muscle usage. No respiratory distress.  Abdominal: Soft. Bowel sounds are normal.  Skin: Skin is intact.   Laboratory examination:   Recent Labs    01/01/19 1109 01/01/19 1109 01/26/19 0952 01/30/19 1045 01/30/19 1155 01/30/19 1219 01/31/19 0523  NA 138  --  138   < > 142 141 138  K 4.6   < > 4.4   < > 4.3 4.0 3.7  CL 104   < > 107   < > 108 108 105  CO2 18*  --  23  --   --   --  22  GLUCOSE 149*  --  204*   < > 188* 190* 142*  BUN 13  --  11   < > 11 11 9   CREATININE 0.68   < > 0.67   < > 0.50 0.40* 0.81  CALCIUM 9.5  --  9.2  --   --   --  8.8*  GFRNONAA 89  --  >60  --   --   --  >60  GFRAA 102  --  >60  --   --   --  >60   < > = values in this interval not displayed.   CrCl cannot be calculated (Patient's most recent lab result is older than the maximum 21 days allowed.).  CMP Latest Ref Rng & Units 01/31/2019 01/30/2019 01/30/2019  Glucose 70 - 99 mg/dL 142(H) 190(H) 188(H)  BUN 8 - 23 mg/dL 9 11 11   Creatinine 0.44 - 1.00 mg/dL 0.81 0.40(L) 0.50  Sodium 135 - 145 mmol/L 138 141 142  Potassium 3.5 - 5.1 mmol/L 3.7 4.0 4.3  Chloride 98 - 111 mmol/L 105 108 108  CO2 22 - 32 mmol/L 22 - -  Calcium 8.9 - 10.3 mg/dL 8.8(L) - -  Total Protein 6.5 - 8.1 g/dL - - -  Total Bilirubin 0.3 - 1.2 mg/dL - - -  Alkaline Phos 38 - 126 U/L - - -  AST 15 - 41 U/L - - -  ALT 0 - 44 U/L - - -   CBC Latest Ref Rng & Units 01/31/2019 01/30/2019 01/30/2019  WBC 4.0 - 10.5 K/uL 9.4 - -  Hemoglobin 12.0 - 15.0 g/dL 10.8(L) 12.2  11.2(L)  Hematocrit 36.0 - 46.0 % 33.3(L) 36.0 33.0(L)  Platelets 150 - 400 K/uL 176 - -   Lipid Panel  No results found for: CHOL, TRIG, HDL, CHOLHDL, VLDL, LDLCALC, LDLDIRECT HEMOGLOBIN A1C Lab Results  Component Value Date   HGBA1C 7.2 (H) 01/26/2019   MPG 159.94 01/26/2019   TSH No results for input(s): TSH in the last 8760 hours.  External labs:   Labs 06/12/2019:  A1c is 7.4%.  Cholesterol, total 164.000 M 09/16/2017 Triglycerides 117.000 M 09/16/2017 HDL 57.000 M 09/16/2017 LDL-C 84.000 M 09/16/2017  Medications and allergies   Allergies  Allergen Reactions  . Bee Venom Itching and Swelling  . Latex Itching and Swelling  . Lovenox [Enoxaparin Sodium] Other (See Comments)    Bruising  . Minocycline Nausea And Vomiting  . Morphine And Related Nausea And Vomiting  . Nickel Other (See Comments)    Contact dermatitis   . Shellfish Allergy Itching  . Statins     Muscle aches  . Sulfa Antibiotics Nausea And Vomiting  . Tape Itching    Bandaids and adhesive tape  . Glimepiride Rash     Current Outpatient Medications  Medication Instructions  . acetaminophen (TYLENOL) 1,300 mg, Oral, Every 8 hours PRN  . amoxicillin (AMOXIL) 2,000 mg, Oral, See admin instructions, Take 2000 mg 1 hour prior to dental work  . Ascorbic Acid (VITAMIN C PO) 1 tablet, Oral, Daily PRN  . aspirin EC 81 mg, Oral, Daily at bedtime  . atenolol (TENORMIN) 25 MG tablet TAKE 1 TABLET BY MOUTH TWICE DAILY  . clonazePAM (KLONOPIN) 0.5 mg, 2 times daily  . diphenhydrAMINE (BENADRYL ALLERGY) 25 mg, Oral, As needed  . esomeprazole (NEXIUM) 40 mg, Oral, Daily  . estradiol (ESTRACE) 0.1 MG/GM vaginal cream 1 Applicatorful, Vaginal, As needed  . FLUoxetine (PROZAC) 40 mg, Oral, Daily  . fluticasone (FLONASE) 50 MCG/ACT nasal spray 1 spray, Each Nare, Daily PRN  . hydrocortisone (ANUSOL-HC) 2.5 % rectal cream 1 application, Rectal, Daily PRN  . Livalo 4 mg, Oral, Daily  . metFORMIN (GLUCOPHAGE-XR)  500 mg, Oral, 2 times daily  . pantoprazole (PROTONIX) 40 mg, Oral, Daily  . Propylene Glycol (SYSTANE BALANCE) 0.6 % SOLN 1 drop, Both Eyes, Daily PRN   Radiology:   No results found.  Cardiac Studies:   Carotid artery duplex Sep 16, 2017: Minimal stenosis in the bilateral internal carotid artery (1-15%). No significant plaque.  Antegrade right vertebral artery flow. Antegrade left vertebral artery flow. F/U studies if clinically indicated. No significant change from 02/01/2015.  TEE 08/31/2017: - Left ventricle: There was mild concentric hypertrophy. Systolicfunction was normal. The estimated ejection fraction was in the range of 55% to 60%. - Aortic valve: Trileaflet; moderately calcified leaflets. Therewas moderate stenosis. There was trivial regurgitation. Meangradient (S): 32 mm Hg.  Peak gradient (S): 65 mm Hg. Valve area(VTI): 0.78 cm^2. Valve area (Vmax): 1.04 cm^2. Valve area(Vmean): 0.96 cm^2. - Mitral valve: Mildly calcified annulus.  Lexiscan myoview stress test 09/16/2017: 1. Lexiscan stress test was performed. Exercise capacity was not assessed. Stress symptoms included dyspnea, dizziness and headache. Peak blood pressure 168/90 mmHg. Stress EKG is non diagnostic for ischemia as it is a pharmacologic stress. The stress electrocardiogram demonstrated sinus tachycardia, normal resting conduction, left ventricular hypertrophy, no resting arrhythmias and normal rest repolarization. 2. The overall quality of the study is good. Left ventricular cavity is noted to be  normal on the rest and stress studies. Gated SPECT images reveal normal myocardial thickening and wall motion. The left ventricular ejection fraction was calculated or visually estimated to be 69%. SPECT images demonstrate predominantly reversible, medium sized, mild intensity perfusion defect. This likely suggests LCx/RPDA territory ischemia.  3. Intermediate risk study.  Right and left heart  catheterization 10/04/2017: Hemodynamic data: Right heart catheterization: RA 5/3, mean 1 mmHg, RV 24/0, EDP 4 mmHg. PA 27/4, mean 50. PA saturation 64%. PW 12/16, mean 7 mmHg. Aortic saturation 98%. CO 4.67, CI 2.25, preserved.  Left heart catheterization hemodynamic data: LV 164/3, EDP 13 mmHg. Aortic pressure 105/52, mean 74 mmHg. Peak to peak pressure gradient of 27 mmHg, mean gradient 22.5 mm and valve area 0.99 cm.  Angiographic data: Normal coronary arteries, right dominant circulation.  Echocardiogram 02/22/2019: 1. Left ventricular ejection fraction, by visual estimation, is 60 to 65%. The left ventricle has normal function. There is no left ventricular hypertrophy. 2. Left ventricular diastolic parameters are consistent with Grade II diastolic dysfunction (pseudonormalization). 3. Elevated left ventricular end-diastolic pressure. 4. Global right ventricle has normal systolic function.The right ventricular size is normal. No increase in right ventricular wall thickness. 5. Left atrial size was moderately dilated. 6. Right atrial size was mildly dilated. 7. Severe mitral annular calcification. 8. The mitral valve is normal in structure. No evidence of mitral valve regurgitation. No evidence of mitral stenosis. 9. The tricuspid valve is normal in structure. Tricuspid valve regurgitation is not demonstrated. 10. 19mm Edwards Sapien bioprosthetic, stented aortic valve (TAVR) valve is present in the aortic position. Aortic valve mean gradient measures 14.0 mmHg. Aortic valve peak gradient measures 27.0 mmHg. Aortic valve area, by VTI measures 1.36 cm. 11. The pulmonic valve was normal in structure. Pulmonic valve regurgitation is not visualized. 12. The inferior vena cava is normal in size with greater than 50% respiratory variability, suggesting right atrial pressure of 3 mmHg.  EKG  EKG 09/10/2019: Normal sinus rhythm at rate of 63 bpm, left axis deviation, left  intrafascicular block.  IVCD.  LVH.  No significant change from 02/22/2019.  Assessment     ICD-10-CM   1. S/P TAVR (transcatheter aortic valve replacement)  Z95.2 EKG 12-Lead  2. Essential hypertension  I10   3. Pure hypercholesterolemia  E78.00 Pitavastatin Calcium (LIVALO) 4 MG TABS    Lipid Panel With LDL/HDL Ratio  4. Type 2 diabetes mellitus without complication, without long-term current use of insulin (HCC)  E11.9 Hgb A1c w/o eAG     Meds ordered this encounter  Medications  . Pitavastatin Calcium (LIVALO) 4 MG TABS    Sig: Take 1 tablet (4 mg total) by mouth daily.    Dispense:  30 tablet    Refill:  6    Medications Discontinued During This Encounter  Medication Reason  . clopidogrel (PLAVIX) 75 MG tablet Completed Course  . LIVALO 2 MG TABS Change in therapy    Recommendations:   MALAKAI BEATON  is a 74 y.o. 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 She now presents for follow up.   She is presently doing well, she has not had any recurrence of dyspnea, no clinical evidence of heart failure today, EKG is essentially unchanged and no heart block and does show LVH.  I discussed with her extensively regarding lifestyle modification risk factor modification although 73 years  of age.  Morbid obesity, calorie restriction discussed in detail.  She is tolerating Livalo, I refilled the prescription, in view of cost, will change 2 mg Livalo to 4 mg to be taken as directed, may be she can take 1/2 tablet daily and try for taking 4 mg other days. Obtain lipid profile testing, will also add A1c to her profile.  Unless problems, I will see her back in 6 months.  Adrian Prows, MD, [facility] 09/10/2019, 4:14 PM [facility] [place] Pager: [phone] Office: [phone]

## 2019-09-20 LAB — LIPID PANEL WITH LDL/HDL RATIO
Cholesterol, Total: 176 mg/dL (ref 100–199)
HDL: 49 mg/dL (ref >39)
LDL Chol Calc (NIH): 100 mg/dL — ABNORMAL HIGH (ref 0–99)
LDL/HDL Ratio: 2 ratio (ref 0.0–3.2)
Triglycerides: 155 mg/dL — ABNORMAL HIGH (ref 0–149)
VLDL Cholesterol Cal: 27 mg/dL (ref 5–40)

## 2019-09-20 LAB — HGB A1C W/O EAG: Hgb A1c MFr Bld: 7.3 % — ABNORMAL HIGH (ref 4.8–5.6)

## 2019-09-20 NOTE — Progress Notes (Signed)
Lipids borderline high, no known vascular disease. COntinue Livalo and can increase to 4 mg twice weekly from 2 mg daily. CC: PCP

## 2019-10-04 ENCOUNTER — Other Ambulatory Visit: Payer: Self-pay | Admitting: Cardiology

## 2019-10-24 ENCOUNTER — Other Ambulatory Visit: Payer: Self-pay

## 2019-10-24 ENCOUNTER — Ambulatory Visit: Payer: Medicare Other | Admitting: Family Medicine

## 2019-10-24 ENCOUNTER — Encounter: Payer: Self-pay | Admitting: Family Medicine

## 2019-10-24 VITALS — BP 114/64 | HR 66 | Temp 98.5°F | Ht 62.0 in | Wt 233.1 lb

## 2019-10-24 DIAGNOSIS — E669 Obesity, unspecified: Secondary | ICD-10-CM | POA: Diagnosis not present

## 2019-10-24 DIAGNOSIS — R809 Proteinuria, unspecified: Secondary | ICD-10-CM | POA: Diagnosis not present

## 2019-10-24 DIAGNOSIS — E1169 Type 2 diabetes mellitus with other specified complication: Secondary | ICD-10-CM | POA: Diagnosis not present

## 2019-10-24 DIAGNOSIS — F411 Generalized anxiety disorder: Secondary | ICD-10-CM

## 2019-10-24 MED ORDER — LOSARTAN POTASSIUM 25 MG PO TABS: 25.0000 mg | ORAL_TABLET | Freq: Every day | ORAL | 2 refills | Status: DC

## 2019-10-24 NOTE — Progress Notes (Signed)
Chief Complaint  Patient presents with  . New Patient (Initial Visit)       New Patient Visit SUBJECTIVE: HPI: Kara Lee is an 74 y.o.female who is being seen for establishing care.  The patient was previously seen at [facility], pcp retired.   DM II Diet is fair. Will sometimes walk. Last A1c was 7.1.  She is taking Metformin XR 500 mg twice daily and Januvia 100 mg daily.  She has been found to have proteinuria and albumin creatinine ratio greater than 30 for the past 4 years.  She is not on an ACE inhibitor or angiotensin receptor blocker.  She believes her primary care provider was going to start her on this prior to retiring.  She is up-to-date with her foot exam, eye exam, and she believes she is up-to-date with her vaccinations.  Her last pneumococcal vaccination was in 2012 before she was 63.  GAD Pt w hx of GAD taking Prozac 40 mg/d. No AE's, reports compliance. She is also on Klonopin 0.25 mg bid. She does not feel she needs it anymore, but her sister is currently hospice, so would like to wait until this matter has passed before trying to wean off.   Past Medical History:  Diagnosis Date  . Anxiety   . Arthritis   . Complication of anesthesia   . Concussion November 09, 2012   From a fall at home on deck  . Depression    postpartum depression after 2nd child  . Diabetes mellitus ([place])   . Fatty liver   . GERD (gastroesophageal reflux disease)   . HLD (hyperlipidemia)   . Morbid obesity ([place])   . OSA on CPAP   . Osteoarthritis of right knee 07/31/2013  . PONV (postoperative nausea and vomiting)   . Rosacea   . S/P TAVR (transcatheter aortic valve replacement)   . Severe aortic stenosis    Past Surgical History:  Procedure Laterality Date  . ABDOMINAL HYSTERECTOMY     still has uterus  . ANKLE RECONSTRUCTION Left   . BIOPSY BREAST Bilateral   . CESAREAN SECTION     x 2  . CHOLECYSTECTOMY    . COLONOSCOPY    . EYE SURGERY     lasik; X3 for crossed eyes; 1 eyelid  surgery  . HERNIA REPAIR    . PARTIAL KNEE ARTHROPLASTY Right 07/31/2013   Procedure: RIGHT UNICOMPARTMENTAL KNEE;  Surgeon: Johnny Bridge, MD;  Location: [facility];  Service: Orthopedics;  Laterality: Right;  . RIGHT/LEFT HEART CATH AND CORONARY ANGIOGRAPHY N/A 10/04/2017   Procedure: RIGHT/LEFT HEART CATH AND CORONARY ANGIOGRAPHY;  Surgeon: Adrian Prows, MD;  Location: [place] [facility];  Service: Cardiovascular;  Laterality: N/A;  . TEE WITHOUT CARDIOVERSION N/A 08/31/2017   Procedure: TRANSESOPHAGEAL ECHOCARDIOGRAM (TEE);  Surgeon: Adrian Prows, MD;  Location: [facility];  Service: Cardiovascular;  Laterality: N/A;  . TEE WITHOUT CARDIOVERSION N/A 01/30/2019   Procedure: TRANSESOPHAGEAL ECHOCARDIOGRAM (TEE);  Surgeon: Sherren Mocha, MD;  Location: [facility];  Service: Open Heart Surgery;  Laterality: N/A;  . TRANSCATHETER AORTIC VALVE REPLACEMENT, TRANSFEMORAL N/A 01/30/2019   Procedure: TRANSCATHETER AORTIC VALVE REPLACEMENT, TRANSFEMORAL;  Surgeon: Sherren Mocha, MD;  Location: [place] [facility];  Service: Open Heart Surgery;  Laterality: N/A;  . TUBAL LIGATION     Family History  Problem Relation Age of Onset  . Pancreatitis Mother   . COPD Mother   . Cancer - Lung Mother   . Diabetes type II Mother  Allergies  Allergen Reactions  . Bee Venom Itching and Swelling  . Latex Itching and Swelling  . Lovenox [Enoxaparin Sodium] Other (See Comments)    Bruising  . Minocycline Nausea And Vomiting  . Morphine And Related Nausea And Vomiting  . Nickel Other (See Comments)    Contact dermatitis   . Shellfish Allergy Itching  . Statins     Muscle aches  . Sulfa Antibiotics Nausea And Vomiting  . Tape Itching    Bandaids and adhesive tape  . Glimepiride Rash    Current Outpatient Medications:  .  acetaminophen (TYLENOL) 650 MG CR tablet, Take 1,300 mg by mouth every 8 (eight) hours as needed for pain., Disp: , Rfl:  .  amoxicillin (AMOXIL) 500 MG capsule, Take 4  capsules (2,000 mg total) by mouth See admin instructions. Take 2000 mg 1 hour prior to dental work, Disp: 12 capsule, Rfl: 12 .  aspirin EC 81 MG tablet, Take 81 mg by mouth at bedtime. , Disp: , Rfl:  .  atenolol (TENORMIN) 25 MG tablet, TAKE 1 TABLET BY MOUTH TWICE DAILY, Disp: 180 tablet, Rfl: 2 .  clonazePAM (KLONOPIN) 0.5 MG tablet, Take 1/2 by mouth twice daily, Disp: , Rfl:  .  diphenhydrAMINE (BENADRYL ALLERGY) 25 mg capsule, Take 25 mg by mouth as needed., Disp: , Rfl:  .  esomeprazole (NEXIUM) 40 MG capsule, Take 40 mg by mouth daily., Disp: , Rfl:  .  estradiol (ESTRACE) 0.1 MG/GM vaginal cream, Place 1 Applicatorful vaginally as needed (irritation). , Disp: , Rfl:  .  FLUoxetine (PROZAC) 40 MG capsule, Take 40 mg by mouth daily., Disp: , Rfl:  .  fluticasone (FLONASE) 50 MCG/ACT nasal spray, Place 1 spray into both nostrils daily as needed for allergies. , Disp: , Rfl:  .  hydrocortisone (ANUSOL-HC) 2.5 % rectal cream, Place 1 application rectally daily as needed for hemorrhoids. , Disp: , Rfl:  .  metFORMIN (GLUCOPHAGE-XR) 500 MG 24 hr tablet, Take 1 tablet (500 mg total) by mouth 2 (two) times daily., Disp: , Rfl:  .  methocarbamol (ROBAXIN) 500 MG tablet, Take 500 mg by mouth every 6 (six) hours as needed for muscle spasms., Disp: , Rfl:  .  Pitavastatin Calcium (LIVALO) 4 MG TABS, Take 1 tablet (4 mg total) by mouth daily., Disp: 30 tablet, Rfl: 6 .  Propylene Glycol (SYSTANE BALANCE) 0.6 % SOLN, Place 1 drop into both eyes daily as needed (dry eyes)., Disp: , Rfl:  .  sitaGLIPtin (JANUVIA) 100 MG tablet, Take 100 mg by mouth daily., Disp: , Rfl:  .  losartan (COZAAR) 25 MG tablet, Take 1 tablet (25 mg total) by mouth daily., Disp: 30 tablet, Rfl: 2  OBJECTIVE: BP 114/64 (BP Location: Left Arm, Patient Position: Sitting, Cuff Size: Normal)   Pulse 66   Temp 98.5 F (36.9 C) (Oral)   Ht 5\' 2"  (1.575 m)   Wt 233 lb 2 oz (105.7 kg)   SpO2 97%   BMI 42.64 kg/m  General:  well  developed, well nourished, in no apparent distress Throat/Pharynx:  lips and gingiva without lesion; tongue and uvula midline; non-inflamed pharynx; no exudates or postnasal drainage Lungs:  clear to auscultation, breath sounds equal bilaterally, no respiratory distress Cardio:  regular rate and rhythm, no bruits Neuro:  gait normal Psych: well oriented with normal range of affect and appropriate judgment/insight  ASSESSMENT/PLAN: Proteinuria, unspecified type - Plan: losartan (COZAAR) 25 MG tablet  Diabetes mellitus type 2 in obese (HCC)  GAD (generalized anxiety disorder)  1-add low-dose of Cozaar 2-continue care, she will need pneumococcal vaccine at next visit 3-okay to continue Prozac and Klonopin as needed.  We will try to wean down in the future. Patient should return 4 months. The patient voiced understanding and agreement to the plan.   [place], DO 10/24/19  3:21 PM

## 2019-10-24 NOTE — Patient Instructions (Addendum)
Call your eye doctor.   Keep the diet clean and stay active.  Let us know if you need anything.

## 2019-10-29 ENCOUNTER — Telehealth: Payer: Self-pay | Admitting: Family Medicine

## 2019-10-29 NOTE — Telephone Encounter (Signed)
I didn't know she was on insulin?

## 2019-10-29 NOTE — Telephone Encounter (Signed)
Caller : Kara Lee  Call Back # [phone] Call Back # [phone]  Patient is taking 60mg  of Steroids and her sugar is elevated. Patient wants to know if she should take more insulin.   Please Advise

## 2019-10-29 NOTE — Telephone Encounter (Signed)
To Clarify--she is not on insulin she meant on down the road if needed she could/would take it. She is just on metformin and Januvia? She apologized as has been a bad week, her sister has passed away. Also wanted to know is it ok to take Collagen Peptide powder?

## 2019-10-29 NOTE — Telephone Encounter (Signed)
That's fine to take. If her sugars are still high, might need to come in to discuss what to do while on pred. TY.

## 2019-10-29 NOTE — Telephone Encounter (Signed)
Called the patient and she was seen by her rheumatologist and he put her on prednisone 60 mg daily.  Advise is should increase insulin.. ....she will be on this 4 to 4.5 weeks. PM--184 AM--148 PM--216 AM-131 PM--255 AM--154

## 2019-10-29 NOTE — Telephone Encounter (Signed)
Called informed the patient of PCP instructions. She verbalized understanding. 

## 2019-10-31 ENCOUNTER — Ambulatory Visit: Payer: Medicare Other | Attending: Internal Medicine

## 2019-10-31 ENCOUNTER — Ambulatory Visit: Payer: Medicare Other

## 2019-10-31 DIAGNOSIS — Z20822 Contact with and (suspected) exposure to covid-19: Secondary | ICD-10-CM

## 2019-11-01 ENCOUNTER — Telehealth: Payer: Self-pay | Admitting: Family Medicine

## 2019-11-01 LAB — NOVEL CORONAVIRUS, NAA: SARS-CoV-2, NAA: NOT DETECTED

## 2019-11-01 LAB — SARS-COV-2, NAA 2 DAY TAT

## 2019-11-01 NOTE — Telephone Encounter (Signed)
Patient states husband diagnosed with Covid on yesterday July 14,2021. Patients states she is now having coughing symptoms, went to [facility] yesterday and her Covid test is negative.  Patient would like to know what she can do to help husband and herself get better.   Please Advise

## 2019-11-02 NOTE — Telephone Encounter (Signed)
Called informed the patient of PCP instructions. She agreed to do so. Will get another Covid test on Monday.

## 2019-11-02 NOTE — Telephone Encounter (Signed)
Supportive care (fluids/rest), ER if symptoms get severe. If they want an appt, OK to sched w someone today. Ty.

## 2019-11-05 ENCOUNTER — Other Ambulatory Visit: Payer: Medicare Other

## 2019-11-05 ENCOUNTER — Telehealth: Payer: Self-pay | Admitting: Family Medicine

## 2019-11-05 ENCOUNTER — Other Ambulatory Visit: Payer: Self-pay | Admitting: Physician Assistant

## 2019-11-05 DIAGNOSIS — E119 Type 2 diabetes mellitus without complications: Secondary | ICD-10-CM

## 2019-11-05 DIAGNOSIS — Z952 Presence of prosthetic heart valve: Secondary | ICD-10-CM

## 2019-11-05 DIAGNOSIS — U071 COVID-19: Secondary | ICD-10-CM

## 2019-11-05 DIAGNOSIS — I10 Essential (primary) hypertension: Secondary | ICD-10-CM

## 2019-11-05 MED ORDER — SODIUM CHLORIDE 0.9 % IV SOLN
Freq: Once | INTRAVENOUS | Status: AC
  Filled 2019-11-05: qty 5

## 2019-11-05 NOTE — Telephone Encounter (Signed)
Reported Positive Covid + ( Last Week ) Saturday   Patient states that she would antibody testing.   Please advise

## 2019-11-05 NOTE — Progress Notes (Signed)
I connected by phone with Kara Lee on 11/05/2019 at 3:34 PM to discuss the potential use of a new treatment for mild to moderate COVID-19 viral infection in non-hospitalized patients.  This patient is a 74 y.o. female that meets the FDA criteria for Emergency Use Authorization of COVID monoclonal antibody casirivimab/imdevimab.  Has a (+) direct SARS-CoV-2 viral test result  Has mild or moderate COVID-19   Is NOT hospitalized due to COVID-19  Is within 10 days of symptom onset  Has at least one of the high risk factor(s) for progression to severe COVID-19 and/or hospitalization as defined in EUA.  Specific high risk criteria : Older age (>/= 72 yo), chronic prednisone, HTN, CVD, DMT2, obesity   I have spoken and communicated the following to the patient or parent/caregiver regarding COVID monoclonal antibody treatment:  1. FDA has authorized the emergency use for the treatment of mild to moderate COVID-19 in adults and pediatric patients with positive results of direct SARS-CoV-2 viral testing who are 64 years of age and older weighing at least 40 kg, and who are at high risk for progressing to severe COVID-19 and/or hospitalization.  2. The significant known and potential risks and benefits of COVID monoclonal antibody, and the extent to which such potential risks and benefits are unknown.  3. Information on available alternative treatments and the risks and benefits of those alternatives, including clinical trials.  4. Patients treated with COVID monoclonal antibody should continue to self-isolate and use infection control measures (e.g., wear mask, isolate, social distance, avoid sharing personal items, clean and disinfect "high touch" surfaces, and frequent handwashing) according to CDC guidelines.   5. The patient or parent/caregiver has the option to accept or refuse COVID monoclonal antibody treatment.  After reviewing this information with the patient, The patient agreed to  proceed with receiving casirivimab\imdevimab infusion and will be provided a copy of the Fact sheet prior to receiving the infusion.   Sx onset 7/13. Set up for mab infusion tomorrow 7/20 @ 10:30am.   [name] 11/05/2019 3:34 PM

## 2019-11-05 NOTE — Telephone Encounter (Signed)
Patient was confused and wanted to know if she could have infusion. I spoke to Kara Lee and she stated that the infusion for covid is fine

## 2019-11-05 NOTE — Telephone Encounter (Signed)
We don't have a good antibody test at this time so I would recommend not getting it. The FDA recommends against that test as well. We can discuss in appt if she really wants it though.

## 2019-11-05 NOTE — Telephone Encounter (Signed)
Called left message to call back 

## 2019-11-05 NOTE — Telephone Encounter (Signed)
Called informed of PCP response. She agreed that if she has more questions will just schedule a OV

## 2019-11-06 ENCOUNTER — Ambulatory Visit (HOSPITAL_COMMUNITY)
Admission: RE | Admit: 2019-11-06 | Discharge: 2019-11-06 | Disposition: A | Discharge status: Home / Self Care | Payer: Medicare Other | Source: Ambulatory Visit | Attending: Pulmonary Disease | Admitting: Pulmonary Disease

## 2019-11-06 DIAGNOSIS — U071 COVID-19: Secondary | ICD-10-CM | POA: Diagnosis present

## 2019-11-06 DIAGNOSIS — I10 Essential (primary) hypertension: Secondary | ICD-10-CM | POA: Diagnosis not present

## 2019-11-06 DIAGNOSIS — Z23 Encounter for immunization: Secondary | ICD-10-CM | POA: Diagnosis not present

## 2019-11-06 DIAGNOSIS — E119 Type 2 diabetes mellitus without complications: Secondary | ICD-10-CM | POA: Insufficient documentation

## 2019-11-06 DIAGNOSIS — Z952 Presence of prosthetic heart valve: Secondary | ICD-10-CM | POA: Diagnosis not present

## 2019-11-06 MED ORDER — ALBUTEROL SULFATE HFA 108 (90 BASE) MCG/ACT IN AERS: 2.0000 | INHALATION_SPRAY | Freq: Once | RESPIRATORY_TRACT | Status: DC | PRN

## 2019-11-06 MED ORDER — DIPHENHYDRAMINE HCL 50 MG/ML IJ SOLN: 50.0000 mg | Freq: Once | INTRAMUSCULAR | Status: DC | PRN

## 2019-11-06 MED ORDER — METHYLPREDNISOLONE SODIUM SUCC 125 MG IJ SOLR: 125.0000 mg | Freq: Once | INTRAMUSCULAR | Status: DC | PRN

## 2019-11-06 MED ORDER — SODIUM CHLORIDE 0.9 % IV SOLN: INTRAVENOUS | Status: DC | PRN

## 2019-11-06 MED ORDER — FAMOTIDINE IN NACL 20-0.9 MG/50ML-% IV SOLN: 20.0000 mg | Freq: Once | INTRAVENOUS | Status: DC | PRN

## 2019-11-06 MED ORDER — EPINEPHRINE 0.3 MG/0.3ML IJ SOAJ: 0.3000 mg | Freq: Once | INTRAMUSCULAR | Status: DC | PRN

## 2019-11-06 NOTE — Progress Notes (Signed)
  Diagnosis: COVID-19  Physician:Dr Joya Gaskins  Procedure: [facility] Infusion [facility] Med: casirivimab\imdevimab infusion - Provided patient with casirivimab\imdevimab fact sheet for patients, parents and caregivers prior to infusion.  Complications: No immediate complications noted.  Discharge: Discharged home   Dewaine Oats 11/06/2019

## 2019-11-06 NOTE — Discharge Instructions (Signed)

## 2019-11-07 ENCOUNTER — Telehealth: Payer: Self-pay | Admitting: Family Medicine

## 2019-11-07 NOTE — Telephone Encounter (Signed)
Patient states that her blood sugar has been okay in the morning, but spikes at night. She wants to know what she should do. Please call her at [phone] and advise.  Thank you

## 2019-11-07 NOTE — Telephone Encounter (Signed)
If she is on 70/30, I would take 10 units in the mornings while on prednisone as her evening readings appear the highest. We can follow up from there.

## 2019-11-07 NOTE — Telephone Encounter (Signed)
Called the patient informed of PCP instructions. She verbalized understanding. 

## 2019-11-07 NOTE — Telephone Encounter (Signed)
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 Sunday--11/04/19-am 122 PM 211 Monday--11/05/19  PM 285 Tuesday --- 11/06/19  AM--138 and PM 345 Wednesday --- 11/07/19/-AM 123 Last night she took Novolin N-- 10 units  Of insulin and needs advisement of what to do due to taking the prednisone. She has insulin due to in the past having to get cortisone injections in her back so she just has it on hand.

## 2019-11-16 ENCOUNTER — Telehealth: Payer: Self-pay | Admitting: Family Medicine

## 2019-11-16 NOTE — Telephone Encounter (Signed)
Patient informed of PCP response °

## 2019-11-16 NOTE — Telephone Encounter (Signed)
That is no longer recommended. Ty.

## 2019-11-16 NOTE — Telephone Encounter (Signed)
New message:   Pt states her and her spouse had covid but pt wants to know if they need to take another test to see if they are cleared from the virus. Please advise.

## 2019-11-27 ENCOUNTER — Telehealth: Payer: Self-pay | Admitting: Family Medicine

## 2019-11-27 ENCOUNTER — Ambulatory Visit: Payer: Self-pay | Admitting: *Deleted

## 2019-11-27 NOTE — Telephone Encounter (Signed)
Caller: Lavonda  Call Back # [phone]  Patient states that she was exposed to [place] on Saturday and would to know if her and her spouse should get tested. Patient states her and spouse just got over Covid .  Please Advise

## 2019-11-27 NOTE — Telephone Encounter (Signed)
Called informed the patient of PCP instructions. She verbalized understanding. 

## 2019-11-27 NOTE — Telephone Encounter (Signed)
Summary: Clinical Advice   Patient was recently exposed to someone who has tested positive for covid and patient wants to know if she should quarantine and when she should be tested. Please advise      COVID vaccine in February. Patient was diagnosed with COVID 7/17 and treated at infusion clinic. Patient states she had visitors last Saturday- and was informed that they tested + COVID. Patient has been told not to test- to quarentine for 14 days and call for symptoms.  Patient has procedures planned for next week. Patient advised that normally that is the correct advised- but we always state to patients that there are circumstances that may require retesting- jobs, school, surgery/procedure. Patient is aware that some people- not all- will retest as positive long after the virus has run it's course. She is aware and she will contact her provider for instructions. Reason for Disposition . [1] Completed COVID-19 vaccine series (fully vaccinated) AND [2]  COVID-19 exposure AND [3] no symptoms  Answer Assessment - Initial Assessment Questions 1. COVID-19 CLOSE CONTACT: "Who is the person with the confirmed or suspected COVID-19 infection that you were exposed to?"     friend 2. PLACE of CONTACT: "Where were you when you were exposed to COVID-19?" (e.g., home, school, medical waiting room; which city?)    home 3. TYPE of CONTACT: "How much contact was there?" (e.g., sitting next to, live in same house, work in same office, same building)     Visit in the home 4. DURATION of CONTACT: "How long were you in contact with the COVID-19 patient?" (e.g., a few seconds, passed by person, a few minutes, 15 minutes or longer, live with the patient)     Over 15 minutes 5. MASK: "Were you wearing a mask?" "Was the other person wearing a mask?" Note: wearing a mask reduces the risk of an otherwise close contact.     no 6. DATE of CONTACT: "When did you have contact with a COVID-19 patient?" (e.g., how many days  ago)     Last Saturday-8/7 7. COMMUNITY SPREAD: "Are there lots of cases of COVID-19 (community spread) where you live?" (See public health department website, if unsure)       yes 8. SYMPTOMS: "Do you have any symptoms?" (e.g., fever, cough, breathing difficulty, loss of taste or smell)     no 9. PREGNANCY OR POSTPARTUM: "Is there any chance you are pregnant?" "When was your last menstrual period?" "Did you deliver in the last 2 weeks?"     n/a 10. HIGH RISK: "Do you have any heart or lung problems?" "Do you have a weak immune system?" (e.g., heart failure, COPD, asthma, HIV positive, chemotherapy, renal failure, diabetes mellitus, sickle cell anemia, obesity)       Yes-age, heart disease, diabetes 11. TRAVEL: "Have you traveled out of the country recently?" If Yes, ask: "When and where?" Also ask about out-of-state travel, since the CDC has identified some high-risk cities for community spread in the [place]. Note: Travel becomes less relevant if there is widespread community transmission where the patient lives.       no  Protocols used: CORONAVIRUS (COVID-19) VACCINE QUESTIONS AND REACTIONS-A-AH, CORONAVIRUS (COVID-19) EXPOSURE-A-AH

## 2019-11-27 NOTE — Telephone Encounter (Signed)
If symptoms had resolved, I think getting tested 5 days post exposure is fine. That being said, the odds of being reinfected this soon after are quite low. Ty.

## 2019-11-28 ENCOUNTER — Ambulatory Visit: Payer: Medicare Other | Admitting: Neurology

## 2019-12-11 ENCOUNTER — Ambulatory Visit: Payer: Medicare Other

## 2019-12-12 ENCOUNTER — Telehealth: Payer: Self-pay | Admitting: Family Medicine

## 2019-12-12 NOTE — Telephone Encounter (Signed)
Kara Lee is taking new patients still I think.

## 2019-12-12 NOTE — Telephone Encounter (Signed)
Pt would like for you to give her a call about seeing if Dr. Nani Ravens will accept her brother-in-law as a new patient. I explained he is not taking new patients at this time and she wants a call back to see if he can recommend a D.O. in the area.

## 2019-12-12 NOTE — Telephone Encounter (Signed)
Called informed of PCP response/name of MD and location.

## 2019-12-17 NOTE — Telephone Encounter (Signed)
Called to inform PCP ok to see her brother in law as new patient. PCP stated wait until after 01/17/20. Will give scheduler his information and they will call him to schedule.

## 2019-12-18 ENCOUNTER — Other Ambulatory Visit: Payer: Self-pay | Admitting: Cardiology

## 2019-12-18 DIAGNOSIS — I359 Nonrheumatic aortic valve disorder, unspecified: Secondary | ICD-10-CM

## 2019-12-18 DIAGNOSIS — Z952 Presence of prosthetic heart valve: Secondary | ICD-10-CM

## 2019-12-25 ENCOUNTER — Ambulatory Visit: Payer: Self-pay

## 2020-01-15 ENCOUNTER — Other Ambulatory Visit: Payer: Self-pay | Admitting: Otolaryngology

## 2020-01-16 ENCOUNTER — Encounter (HOSPITAL_BASED_OUTPATIENT_CLINIC_OR_DEPARTMENT_OTHER): Payer: Self-pay | Admitting: Otolaryngology

## 2020-01-16 ENCOUNTER — Other Ambulatory Visit: Payer: Self-pay

## 2020-01-17 ENCOUNTER — Encounter (HOSPITAL_BASED_OUTPATIENT_CLINIC_OR_DEPARTMENT_OTHER)
Admission: RE | Admit: 2020-01-17 | Discharge: 2020-01-17 | Disposition: A | Discharge status: Home / Self Care | Payer: Medicare Other | Source: Ambulatory Visit | Attending: Otolaryngology | Admitting: Otolaryngology

## 2020-01-17 ENCOUNTER — Encounter (HOSPITAL_COMMUNITY)
Admission: RE | Admit: 2020-01-17 | Discharge: 2020-01-17 | Disposition: A | Discharge status: Home / Self Care | Payer: Medicare Other | Source: Ambulatory Visit | Attending: Otolaryngology | Admitting: Otolaryngology

## 2020-01-17 DIAGNOSIS — Z8249 Family history of ischemic heart disease and other diseases of the circulatory system: Secondary | ICD-10-CM | POA: Diagnosis not present

## 2020-01-17 DIAGNOSIS — F32A Depression, unspecified: Secondary | ICD-10-CM | POA: Diagnosis not present

## 2020-01-17 DIAGNOSIS — I509 Heart failure, unspecified: Secondary | ICD-10-CM | POA: Diagnosis not present

## 2020-01-17 DIAGNOSIS — I110 Hypertensive heart disease with heart failure: Secondary | ICD-10-CM | POA: Diagnosis not present

## 2020-01-17 DIAGNOSIS — G4489 Other headache syndrome: Secondary | ICD-10-CM | POA: Diagnosis not present

## 2020-01-17 DIAGNOSIS — Z833 Family history of diabetes mellitus: Secondary | ICD-10-CM | POA: Diagnosis not present

## 2020-01-17 DIAGNOSIS — Z9049 Acquired absence of other specified parts of digestive tract: Secondary | ICD-10-CM | POA: Diagnosis not present

## 2020-01-17 DIAGNOSIS — M1990 Unspecified osteoarthritis, unspecified site: Secondary | ICD-10-CM | POA: Diagnosis not present

## 2020-01-17 DIAGNOSIS — Z6841 Body Mass Index (BMI) 40.0 and over, adult: Secondary | ICD-10-CM | POA: Diagnosis not present

## 2020-01-17 DIAGNOSIS — K219 Gastro-esophageal reflux disease without esophagitis: Secondary | ICD-10-CM | POA: Diagnosis not present

## 2020-01-17 DIAGNOSIS — Z20822 Contact with and (suspected) exposure to covid-19: Secondary | ICD-10-CM | POA: Insufficient documentation

## 2020-01-17 DIAGNOSIS — Z79899 Other long term (current) drug therapy: Secondary | ICD-10-CM | POA: Diagnosis not present

## 2020-01-17 DIAGNOSIS — Z01812 Encounter for preprocedural laboratory examination: Secondary | ICD-10-CM | POA: Diagnosis not present

## 2020-01-17 DIAGNOSIS — G4730 Sleep apnea, unspecified: Secondary | ICD-10-CM | POA: Diagnosis not present

## 2020-01-17 DIAGNOSIS — F419 Anxiety disorder, unspecified: Secondary | ICD-10-CM | POA: Diagnosis not present

## 2020-01-17 LAB — BASIC METABOLIC PANEL
Anion gap: 12 (ref 5–15)
BUN: 14 mg/dL (ref 8–23)
CO2: 22 mmol/L (ref 22–32)
Calcium: 9.4 mg/dL (ref 8.9–10.3)
Chloride: 102 mmol/L (ref 98–111)
Creatinine, Ser: 0.78 mg/dL (ref 0.44–1.00)
GFR calc Af Amer: >60 mL/min (ref >60)
GFR calc non Af Amer: >60 mL/min (ref >60)
Glucose, Bld: 203 mg/dL — ABNORMAL HIGH (ref 70–99)
Potassium: 4.6 mmol/L (ref 3.5–5.1)
Sodium: 136 mmol/L (ref 135–145)

## 2020-01-17 LAB — SARS CORONAVIRUS 2 (TAT 6-24 HRS): SARS Coronavirus 2: NEGATIVE

## 2020-01-18 ENCOUNTER — Other Ambulatory Visit: Payer: Self-pay | Admitting: Family Medicine

## 2020-01-18 DIAGNOSIS — R809 Proteinuria, unspecified: Secondary | ICD-10-CM

## 2020-01-21 ENCOUNTER — Encounter (HOSPITAL_BASED_OUTPATIENT_CLINIC_OR_DEPARTMENT_OTHER)
Admission: RE | Disposition: A | Discharge status: Home / Self Care | Payer: Self-pay | Source: Ambulatory Visit | Attending: Otolaryngology

## 2020-01-21 ENCOUNTER — Ambulatory Visit (HOSPITAL_BASED_OUTPATIENT_CLINIC_OR_DEPARTMENT_OTHER): Payer: Medicare Other | Admitting: Anesthesiology

## 2020-01-21 ENCOUNTER — Other Ambulatory Visit: Payer: Self-pay

## 2020-01-21 ENCOUNTER — Ambulatory Visit (HOSPITAL_BASED_OUTPATIENT_CLINIC_OR_DEPARTMENT_OTHER)
Admission: RE | Admit: 2020-01-21 | Discharge: 2020-01-21 | Disposition: A | Discharge status: Home / Self Care | Payer: Medicare Other | Source: Ambulatory Visit | Attending: Otolaryngology | Admitting: Otolaryngology

## 2020-01-21 ENCOUNTER — Encounter (HOSPITAL_BASED_OUTPATIENT_CLINIC_OR_DEPARTMENT_OTHER): Payer: Self-pay | Admitting: Otolaryngology

## 2020-01-21 DIAGNOSIS — F419 Anxiety disorder, unspecified: Secondary | ICD-10-CM | POA: Insufficient documentation

## 2020-01-21 DIAGNOSIS — F32A Depression, unspecified: Secondary | ICD-10-CM | POA: Insufficient documentation

## 2020-01-21 DIAGNOSIS — Z9049 Acquired absence of other specified parts of digestive tract: Secondary | ICD-10-CM | POA: Insufficient documentation

## 2020-01-21 DIAGNOSIS — K219 Gastro-esophageal reflux disease without esophagitis: Secondary | ICD-10-CM | POA: Insufficient documentation

## 2020-01-21 DIAGNOSIS — M1990 Unspecified osteoarthritis, unspecified site: Secondary | ICD-10-CM | POA: Insufficient documentation

## 2020-01-21 DIAGNOSIS — G4730 Sleep apnea, unspecified: Secondary | ICD-10-CM | POA: Insufficient documentation

## 2020-01-21 DIAGNOSIS — Z833 Family history of diabetes mellitus: Secondary | ICD-10-CM | POA: Insufficient documentation

## 2020-01-21 DIAGNOSIS — G4489 Other headache syndrome: Secondary | ICD-10-CM | POA: Insufficient documentation

## 2020-01-21 DIAGNOSIS — Z79899 Other long term (current) drug therapy: Secondary | ICD-10-CM | POA: Insufficient documentation

## 2020-01-21 DIAGNOSIS — I509 Heart failure, unspecified: Secondary | ICD-10-CM | POA: Insufficient documentation

## 2020-01-21 DIAGNOSIS — I110 Hypertensive heart disease with heart failure: Secondary | ICD-10-CM | POA: Insufficient documentation

## 2020-01-21 DIAGNOSIS — Z6841 Body Mass Index (BMI) 40.0 and over, adult: Secondary | ICD-10-CM | POA: Insufficient documentation

## 2020-01-21 DIAGNOSIS — Z8249 Family history of ischemic heart disease and other diseases of the circulatory system: Secondary | ICD-10-CM | POA: Insufficient documentation

## 2020-01-21 HISTORY — DX: Essential (primary) hypertension: I10

## 2020-01-21 HISTORY — PX: ARTERY BIOPSY: SHX891

## 2020-01-21 LAB — GLUCOSE, CAPILLARY
Glucose-Capillary: 110 mg/dL — ABNORMAL HIGH (ref 70–99)
Glucose-Capillary: 206 mg/dL — ABNORMAL HIGH (ref 70–99)

## 2020-01-21 SURGERY — BIOPSY TEMPORAL ARTERY
Anesthesia: General | Site: Face | Laterality: Left

## 2020-01-21 MED ORDER — LIDOCAINE 2% (20 MG/ML) 5 ML SYRINGE
INTRAMUSCULAR | Status: DC | PRN
  Administered 2020-01-21: 60 mg via INTRAVENOUS

## 2020-01-21 MED ORDER — FENTANYL CITRATE (PF) 100 MCG/2ML IJ SOLN
INTRAMUSCULAR | Status: AC
  Filled 2020-01-21: qty 2

## 2020-01-21 MED ORDER — LACTATED RINGERS IV SOLN: INTRAVENOUS | Status: DC

## 2020-01-21 MED ORDER — DEXAMETHASONE SODIUM PHOSPHATE 10 MG/ML IJ SOLN
INTRAMUSCULAR | Status: AC
  Filled 2020-01-21: qty 1

## 2020-01-21 MED ORDER — PROPOFOL 10 MG/ML IV BOLUS
INTRAVENOUS | Status: DC | PRN
  Administered 2020-01-21: 150 mg via INTRAVENOUS

## 2020-01-21 MED ORDER — EPHEDRINE SULFATE-NACL 50-0.9 MG/10ML-% IV SOSY
PREFILLED_SYRINGE | INTRAVENOUS | Status: DC | PRN
  Administered 2020-01-21 (×2): 10 mg via INTRAVENOUS

## 2020-01-21 MED ORDER — DEXAMETHASONE SODIUM PHOSPHATE 4 MG/ML IJ SOLN
INTRAMUSCULAR | Status: DC | PRN
  Administered 2020-01-21: 6 mg via INTRAVENOUS

## 2020-01-21 MED ORDER — LIDOCAINE 2% (20 MG/ML) 5 ML SYRINGE
INTRAMUSCULAR | Status: AC
  Filled 2020-01-21: qty 5

## 2020-01-21 MED ORDER — BACITRACIN ZINC 500 UNIT/GM EX OINT
TOPICAL_OINTMENT | CUTANEOUS | Status: AC
  Filled 2020-01-21: qty 0.9

## 2020-01-21 MED ORDER — FENTANYL CITRATE (PF) 100 MCG/2ML IJ SOLN
INTRAMUSCULAR | Status: DC | PRN
  Administered 2020-01-21 (×2): 50 ug via INTRAVENOUS

## 2020-01-21 MED ORDER — ONDANSETRON HCL 4 MG/2ML IJ SOLN: 4.0000 mg | Freq: Once | INTRAMUSCULAR | Status: DC | PRN

## 2020-01-21 MED ORDER — ONDANSETRON HCL 4 MG/2ML IJ SOLN
INTRAMUSCULAR | Status: DC | PRN
  Administered 2020-01-21: 4 mg via INTRAVENOUS

## 2020-01-21 MED ORDER — CEFAZOLIN SODIUM 1 G IJ SOLR
INTRAMUSCULAR | Status: AC
  Filled 2020-01-21: qty 20

## 2020-01-21 MED ORDER — FENTANYL CITRATE (PF) 100 MCG/2ML IJ SOLN: 25.0000 ug | INTRAMUSCULAR | Status: DC | PRN

## 2020-01-21 MED ORDER — CEFAZOLIN SODIUM-DEXTROSE 2-3 GM-%(50ML) IV SOLR
INTRAVENOUS | Status: DC | PRN
  Administered 2020-01-21: 2 g via INTRAVENOUS

## 2020-01-21 MED ORDER — LIDOCAINE-EPINEPHRINE 1 %-1:100000 IJ SOLN
INTRAMUSCULAR | Status: DC | PRN
  Administered 2020-01-21: 1 mL

## 2020-01-21 MED ORDER — ONDANSETRON HCL 4 MG/2ML IJ SOLN
INTRAMUSCULAR | Status: AC
  Filled 2020-01-21: qty 2

## 2020-01-21 SURGICAL SUPPLY — 46 items
ADH SKN CLS APL DERMABOND .7 (GAUZE/BANDAGES/DRESSINGS) ×1
BLADE CLIPPER SURG (BLADE) ×2 IMPLANT
BLADE SURG 15 STRL LF DISP TIS (BLADE) ×1 IMPLANT
BLADE SURG 15 STRL SS (BLADE) ×2
CANISTER SUCT 1200ML W/VALVE (MISCELLANEOUS) IMPLANT
CORD BIPOLAR FORCEPS 12FT (ELECTRODE) ×2 IMPLANT
COVER BACK TABLE 60X90IN (DRAPES) IMPLANT
COVER MAYO STAND STRL (DRAPES) ×2 IMPLANT
COVER PROBE W GEL 5X96 (DRAPES) ×2 IMPLANT
COVER WAND RF STERILE (DRAPES) IMPLANT
DERMABOND ADVANCED (GAUZE/BANDAGES/DRESSINGS) ×1
DERMABOND ADVANCED .7 DNX12 (GAUZE/BANDAGES/DRESSINGS) ×1 IMPLANT
DRAPE SURG 17X23 STRL (DRAPES) IMPLANT
DRAPE U-SHAPE 76X120 STRL (DRAPES) ×2 IMPLANT
ELECT COATED BLADE 2.86 ST (ELECTRODE) ×2 IMPLANT
ELECT NEEDLE BLADE 2-5/6 (NEEDLE) ×2 IMPLANT
ELECT REM PT RETURN 9FT ADLT (ELECTROSURGICAL) ×2
ELECTRODE REM PT RTRN 9FT ADLT (ELECTROSURGICAL) ×1 IMPLANT
GAUZE SPONGE 4X4 12PLY STRL LF (GAUZE/BANDAGES/DRESSINGS) IMPLANT
GLOVE BIO SURGEON STRL SZ7.5 (GLOVE) ×2 IMPLANT
GLOVE BIOGEL PI IND STRL 7.0 (GLOVE) ×1 IMPLANT
GLOVE BIOGEL PI INDICATOR 7.0 (GLOVE) ×1
GLOVE SURG SS PI 7.0 STRL IVOR (GLOVE) ×2 IMPLANT
GLOVE SURG SS PI 7.5 STRL IVOR (GLOVE) ×2 IMPLANT
GOWN STRL REUS W/ TWL LRG LVL3 (GOWN DISPOSABLE) ×2 IMPLANT
GOWN STRL REUS W/TWL LRG LVL3 (GOWN DISPOSABLE) ×4
NEEDLE HYPO 25X1 1.5 SAFETY (NEEDLE) IMPLANT
NEEDLE PRECISIONGLIDE 27X1.5 (NEEDLE) ×2 IMPLANT
NS IRRIG 1000ML POUR BTL (IV SOLUTION) ×2 IMPLANT
PACK BASIN DAY SURGERY FS (CUSTOM PROCEDURE TRAY) ×2 IMPLANT
PENCIL SMOKE EVACUATOR (MISCELLANEOUS) ×2 IMPLANT
SHEET MEDIUM DRAPE 40X70 STRL (DRAPES) ×2 IMPLANT
SPONGE GAUZE 2X2 8PLY STRL LF (GAUZE/BANDAGES/DRESSINGS) IMPLANT
SUCTION FRAZIER HANDLE 10FR (MISCELLANEOUS)
SUCTION TUBE FRAZIER 10FR DISP (MISCELLANEOUS) IMPLANT
SUT PLAIN GUT FAST 5-0 (SUTURE) ×2 IMPLANT
SUT SILK 3 0 TIES 17X18 (SUTURE) ×2
SUT SILK 3-0 18XBRD TIE BLK (SUTURE) ×1 IMPLANT
SUT SILK 4 0 TIES 17X18 (SUTURE) IMPLANT
SUT VIC AB 4-0 P-3 18XBRD (SUTURE) ×1 IMPLANT
SUT VIC AB 4-0 P3 18 (SUTURE) ×2
SWABSTICK POVIDONE IODINE SNGL (MISCELLANEOUS) IMPLANT
SYR CONTROL 10ML LL (SYRINGE) ×2 IMPLANT
TOWEL GREEN STERILE FF (TOWEL DISPOSABLE) ×2 IMPLANT
TRAY DSU PREP LF (CUSTOM PROCEDURE TRAY) ×2 IMPLANT
TUBE CONNECTING 20X1/4 (TUBING) IMPLANT

## 2020-01-21 NOTE — Anesthesia Postprocedure Evaluation (Signed)
Anesthesia Post Note  Patient: Kara Lee  Procedure(s) Performed: BIOPSY TEMPORAL ARTERY (Left Face)     Patient location during evaluation: PACU Anesthesia Type: General Level of consciousness: sedated and patient cooperative Pain management: pain level controlled Vital Signs Assessment: post-procedure vital signs reviewed and stable Respiratory status: spontaneous breathing Cardiovascular status: stable Anesthetic complications: no   No complications documented.  Last Vitals:  Vitals:   01/21/20 1015 01/21/20 1034  BP: 130/66 121/63  Pulse: 71 85  Resp: 13 18  Temp:  37.1 C  SpO2: 97% 98%    Last Pain:  Vitals:   01/21/20 1034  TempSrc:   PainSc: 0-No pain                 Nolon Nations

## 2020-01-21 NOTE — Anesthesia Procedure Notes (Signed)
Procedure Name: LMA Insertion Date/Time: 01/21/2020 8:47 AM Performed by: Lieutenant Diego, CRNA Pre-anesthesia Checklist: Patient identified, Emergency Drugs available, Suction available and Patient being monitored Patient Re-evaluated:Patient Re-evaluated prior to induction Oxygen Delivery Method: Circle system utilized Preoxygenation: Pre-oxygenation with 100% oxygen Induction Type: IV induction Ventilation: Mask ventilation without difficulty LMA: LMA inserted LMA Size: 4.0 Number of attempts: 1 Placement Confirmation: positive ETCO2 and breath sounds checked- equal and bilateral Tube secured with: Tape Dental Injury: Teeth and Oropharynx as per pre-operative assessment

## 2020-01-21 NOTE — Transfer of Care (Signed)
Immediate Anesthesia Transfer of Care Note  Patient: Kara Lee  Procedure(s) Performed: BIOPSY TEMPORAL ARTERY (Left Face)  Patient Location: PACU  Anesthesia Type:General  Level of Consciousness: awake  Airway & Oxygen Therapy: Patient Spontanous Breathing and Patient connected to face mask oxygen  Post-op Assessment: Report given to RN and Post -op Vital signs reviewed and stable  Post vital signs: Reviewed and stable  Last Vitals:  Vitals Value Taken Time  BP 126/67 01/21/20 0945  Temp    Pulse 85 01/21/20 0946  Resp 19 01/21/20 0946  SpO2 95 % 01/21/20 0946  Vitals shown include unvalidated device data.  Last Pain:  Vitals:   01/21/20 0743  TempSrc: Oral  PainSc: 2       Patients Stated Pain Goal: 5 (37/95/58 3167)  Complications: No complications documented.

## 2020-01-21 NOTE — Op Note (Signed)
DATE OF PROCEDURE: 01/21/2020  OPERATIVE REPORT   SURGEON: Leta Baptist, MD   PREOPERATIVE DIAGNOSIS: Left temporal headaches, possible temporal arteritis  POSTOPERATIVE DIAGNOSIS: Left temporal headaches, possible temporal arteritis  PROCEDURES PERFORMED: 1. Left temporal artery biopsy.  ANESTHESIA: General laryngeal mask anesthesia.  COMPLICATIONS: None.  ESTIMATED BLOOD LOSS: Minimal.  INDICATION FOR PROCEDURE:  LORIANA SAMAD is a 74 y.o. female with a history of chronic left temporal headaches. The patient recently was noted to have an elevated SED rate, concerning for possible temporal arteritis. She has been on high dose prednisone for 7 weeks with some improvement in her headaches. Based on the above findings, the decision was made for the patient to undergo the above-stated procedure. The risks, benefits, alternatives, and details of the procedures were discussed with the patient. Questions were invited and answered. Informed consent was obtained.  DESCRIPTION OF PROCEDURE: The patient was taken to the operating room and placed supine on the operating table. General laryngeal mask anesthesia was induced by the anesthesiologist.  The patient was positioned and prepped and draped in the standard fashion for left temporal artery biopsy.  The location of the left temporal artery was identified using the Doppler device.  1% lidocaine with 1-100,000 epinephrine was infiltrated at the planned site of incision.  A vertical incision was made over the left temporal artery.  The dissection was carried down to the level of the artery.  An approximately 1 cm segment of the temporal artery was resected and sent to the pathology department for permanent histologic identification.  The surgical site was copiously irrigated.  Hemostasis was achieved with the Bovie electrocautery device.  The care of the patient was turned over to the anesthesiologist. The patient was awakened from  anesthesia without difficulty. She was extubated and transferred to the recovery room in good condition.  OPERATIVE FINDINGS: A segment of the left temporal artery was identified and resected.  SPECIMEN: Left temporal artery.  FOLLOWUP CARE: The patient will be discharged home once she is awake and alert.   Brigida Scotti Wooi Rashena Dowling. MD

## 2020-01-21 NOTE — H&P (Signed)
Cc: Temporal headaches  HPI: The patient is a 74 y/o female who presents today for evaluation of chronic headaches. The patient has noted headaches for many years. The headaches seem to be localized over the left temporal area. The patient recently was noted to have an elevated SED rate concerning for possible temporal arteritis. She has been on high dose prednisone for 7 weeks with some improvement in her headaches. The patient has not noted any change in her vision except when her glucose is elevated. The patient has an appointment with neurology pending. The patient was referred for temporal artery biopsy for definitive diagnosis.   The patient's review of systems (constitutional, eyes, ENT, cardiovascular, respiratory, GI, musculoskeletal, skin, neurologic, psychiatric, endocrine, hematologic, allergic) is noted in the ROS questionnaire.  It is reviewed with the patient.   Family health history: Diabetes, heart disease, problems with anesthesia, hearing loss.  Major events: Cataracts extraction, hysterectomy, right knee replacement, gallbladder removed, C-section, heart valve for aortic stenois.  Ongoing medical problems: Cataracts, irregular pulse, reflux, headaches, depression, diabetes, hay fever.  Social history: The patient is married. She denies the use of tobacco, alcohol or illegal drugs.   Exam: General: Communicates without difficulty, well nourished, no acute distress. Head: Normocephalic, no evidence injury, no tenderness, facial buttresses intact without stepoff. Eyes: PERRL, EOMI. No scleral icterus, conjunctivae clear. Neuro: CN II exam reveals vision grossly intact.  No nystagmus at any point of gaze. Ears: Auricles well formed without lesions.  Ear canals are intact without mass or lesion.  No erythema or edema is appreciated.  The TMs are intact without fluid. Nose: External evaluation reveals normal support and skin without lesions.  Dorsum is intact.  Anterior rhinoscopy reveals  healthy pink mucosa over anterior aspect of inferior turbinates and intact septum.  No purulence noted. Oral:  Oral cavity and oropharynx are intact, symmetric, without erythema or edema.  Mucosa is moist without lesions. Neck: Full range of motion without pain.  There is no significant lymphadenopathy.  No masses palpable.  Thyroid bed within normal limits to palpation.  Parotid glands and submandibular glands equal bilaterally without mass.  Trachea is midline. Neuro:  CN 2-12 grossly intact. Gait normal. Vestibular: No nystagmus at any point of gaze. The cerebellar examination is unremarkable.   Assessment History of left temporal headaches with elevated SED rate, concerning for temporal arteritis. The patient's ENT exam is normal today.   Plan  1. The physical exam findings are reviewed with the patient.  2. Plan for left temporal artery biopsy. The risks, benefits, alternatives, and details of the procedure are reviewed with the patient. Questions are invited and answered. 3. The patient is interested in proceeding with the procedure.  We will schedule the procedure in accordance with the family schedule.

## 2020-01-21 NOTE — Discharge Instructions (Signed)
  Post Anesthesia Home Care Instructions  Activity: Get plenty of rest for the remainder of the day. A responsible individual must stay with you for 24 hours following the procedure.  For the next 24 hours, DO NOT: -Drive a car -Paediatric nurse -Drink alcoholic beverages -Take any medication unless instructed by your physician -Make any legal decisions or sign important papers.  Meals: Start with liquid foods such as gelatin or soup. Progress to regular foods as tolerated. Avoid greasy, spicy, heavy foods. If nausea and/or vomiting occur, drink only clear liquids until the nausea and/or vomiting subsides. Call your physician if vomiting continues.  Special Instructions/Symptoms: Your throat may feel dry or sore from the anesthesia or the breathing tube placed in your throat during surgery. If this causes discomfort, gargle with warm salt water. The discomfort should disappear within 24 hours.  If you had a scopolamine patch placed behind your ear for the management of post- operative nausea and/or vomiting:  1. The medication in the patch is effective for 72 hours, after which it should be removed.  Wrap patch in a tissue and discard in the trash. Wash hands thoroughly with soap and water. 2. You may remove the patch earlier than 72 hours if you experience unpleasant side effects which may include dry mouth, dizziness or visual disturbances. 3. Avoid touching the patch. Wash your hands with soap and water after contact with the patch.    -----------------  The patient may resume all her previous activities and diet.  No postop activity restriction.  She may use Tylenol/ibuprofen as needed for pain.  The patient may follow-up in my office as needed.

## 2020-01-21 NOTE — Anesthesia Preprocedure Evaluation (Addendum)
Anesthesia Evaluation  Patient identified by MRN, date of birth, ID band Patient awake    Reviewed: Allergy & Precautions, NPO status , Patient's Chart, lab work & pertinent test results  History of Anesthesia Complications (+) PONV and history of anesthetic complications  Airway Mallampati: II  TM Distance: <3 FB Neck ROM: Full    Dental  (+) Dental Advisory Given, Teeth Intact   Pulmonary sleep apnea ,    Pulmonary exam normal breath sounds clear to auscultation       Cardiovascular hypertension, Pt. on medications and Pt. on home beta blockers + Valvular Problems/Murmurs AS  Rhythm:Regular Rate:Normal  Echo 02/2019 1. Left ventricular ejection fraction, by visual estimation, is 60 to 65%. The left ventricle has normal function. There is no left ventricular hypertrophy.  2. Left ventricular diastolic parameters are consistent with Grade II diastolic dysfunction (pseudonormalization).  3. Elevated left ventricular end-diastolic pressure.  4. Global right ventricle has normal systolic function.The right ventricular size is normal. No increase in right ventricular wall thickness.  5. Left atrial size was moderately dilated.  6. Right atrial size was mildly dilated.  7. Severe mitral annular calcification.  8. The mitral valve is normal in structure. No evidence of mitral valve regurgitation. No evidence of mitral stenosis.  9. The tricuspid valve is normal in structure. Tricuspid valve regurgitation is not demonstrated.  10. 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 Aortic valve mean gradient measures 14.0 mmHg. Aortic valve peak gradient measures 27.0 mmHg. Aortic valve area, by VTI measures 1.36 cm.  11. The pulmonic valve was normal in structure. Pulmonic valve regurgitation is not visualized.  12. The inferior vena cava is normal in size with greater than 50%  respiratory variability, suggesting right atrial pressure of 3 mmHg.    Neuro/Psych PSYCHIATRIC DISORDERS Anxiety Depression negative neurological ROS     GI/Hepatic Neg liver ROS, GERD  ,  Endo/Other  diabetesMorbid obesity  Renal/GU negative Renal ROS     Musculoskeletal  (+) Arthritis ,   Abdominal   Peds  Hematology negative hematology ROS (+)   Anesthesia Other Findings   Reproductive/Obstetrics                            Anesthesia Physical Anesthesia Plan  ASA: III  Anesthesia Plan: MAC   Post-op Pain Management:    Induction: Intravenous  PONV Risk Score and Plan: 3 and Ondansetron, Dexamethasone, Treatment may vary due to age or medical condition, Propofol infusion and TIVA  Airway Management Planned: LMA  Additional Equipment: None  Intra-op Plan:   Post-operative Plan:   Informed Consent: I have reviewed the patients History and Physical, chart, labs and discussed the procedure including the risks, benefits and alternatives for the proposed anesthesia with the patient or authorized representative who has indicated his/her understanding and acceptance.     Dental advisory given  Plan Discussed with: CRNA  Anesthesia Plan Comments:       Anesthesia Quick Evaluation

## 2020-01-22 ENCOUNTER — Ambulatory Visit: Payer: Self-pay | Attending: Internal Medicine

## 2020-01-22 ENCOUNTER — Encounter (HOSPITAL_BASED_OUTPATIENT_CLINIC_OR_DEPARTMENT_OTHER): Payer: Self-pay | Admitting: Otolaryngology

## 2020-01-22 LAB — SURGICAL PATHOLOGY

## 2020-01-23 ENCOUNTER — Ambulatory Visit: Payer: Medicare Other | Admitting: Neurology

## 2020-01-28 ENCOUNTER — Encounter: Payer: Self-pay | Admitting: Family Medicine

## 2020-01-28 ENCOUNTER — Ambulatory Visit: Payer: Medicare Other | Admitting: Family Medicine

## 2020-01-28 ENCOUNTER — Other Ambulatory Visit: Payer: Self-pay

## 2020-01-28 VITALS — BP 112/72 | HR 71 | Temp 98.6°F | Ht 62.0 in | Wt 237.5 lb

## 2020-01-28 DIAGNOSIS — Z7952 Long term (current) use of systemic steroids: Secondary | ICD-10-CM

## 2020-01-28 DIAGNOSIS — E2839 Other primary ovarian failure: Secondary | ICD-10-CM

## 2020-01-28 DIAGNOSIS — R700 Elevated erythrocyte sedimentation rate: Secondary | ICD-10-CM | POA: Diagnosis not present

## 2020-01-28 MED ORDER — PREDNISONE 10 MG PO TABS: ORAL_TABLET | ORAL | 0 refills | Status: AC

## 2020-01-28 NOTE — Progress Notes (Signed)
Chief Complaint  Patient presents with  . Follow-up    medication/needs a referral    Subjective: Patient is a 74 y.o. female here for follow-up.  She is here with her husband Kara Lee.  She is requesting referral to a new rheumatologist.  She has no known autoimmune disease.  She was referred to her current one for an elevated sedimentation rate and concern for temporal arteritis.  Fortunately, the biopsy was negative.  She is in the process of being weaned off of her high-dose prednisone, 60 mg daily.  Starting on 8/31- 40 mg/d of prednisone.  Starting 10/6- 30 mg/d, which she is currently taking.  She had a bone density scan.  She was told by someone that she was osteoporotic, but was told by another staff member that it was mild.  She is not taking any calcium or vitamin D.  Past Medical History:  Diagnosis Date  . Anxiety   . Complication of anesthesia   . Concussion November 09, 2012   From a fall at home on deck  . Depression    postpartum depression after 2nd child  . Diabetes mellitus ([place])   . Fatty liver   . GERD (gastroesophageal reflux disease)   . HLD (hyperlipidemia)   . Hypertension   . Morbid obesity ([place])   . OSA on CPAP    uses CPAP nightly  . Osteoarthritis of right knee 07/31/2013  . PONV (postoperative nausea and vomiting)   . Rosacea   . Severe aortic stenosis    Objective: BP 112/72 (BP Location: Left Arm, Patient Position: Sitting, Cuff Size: Large)   Pulse 71   Temp 98.6 F (37 C) (Oral)   Ht 5\' 2"  (1.575 m)   Wt 237 lb 8 oz (107.7 kg)   SpO2 96%   BMI 43.44 kg/m  General: Awake, appears stated age Lungs:  No accessory muscle use Psych: Age appropriate judgment and insight, normal affect and mood  Assessment and Plan: Elevated sed rate  Estrogen deficiency  Long term (current) use of systemic steroids  I will decrease her prednisone in 10 mg increments every 30 days until she is on 10 mg tabs.  After that, we will have her take 5 mg daily for 30  days.  I need to see her results for bone density scan.  We had her sign a records release form today.  After the negative temporal artery biopsy, I am less concerned with an elevated sed rate which was reportedly normal at the last draw. I did recommend she take 1200 mg of calcium daily and at least 1000 units of vitamin D3 daily. Follow-up as originally scheduled. The patient voiced understanding and agreement to the plan.  [place], DO 01/28/20  4:10 PM

## 2020-01-28 NOTE — Patient Instructions (Addendum)
I don't think you need to see a rheumatologist at this time.    Take 1200 mg of calcium daily and 1000 units of Vit D3 daily.   We will be in touch regarding my recommendations for your bone density scan.  Let us know if you need anything.

## 2020-02-03 ENCOUNTER — Encounter (HOSPITAL_BASED_OUTPATIENT_CLINIC_OR_DEPARTMENT_OTHER): Payer: Self-pay | Admitting: Emergency Medicine

## 2020-02-03 ENCOUNTER — Other Ambulatory Visit: Payer: Self-pay

## 2020-02-03 ENCOUNTER — Emergency Department (HOSPITAL_BASED_OUTPATIENT_CLINIC_OR_DEPARTMENT_OTHER)
Admission: EM | Admit: 2020-02-03 | Discharge: 2020-02-03 | Disposition: A | Discharge status: Home / Self Care | Payer: Medicare Other | Attending: Emergency Medicine | Admitting: Emergency Medicine

## 2020-02-03 ENCOUNTER — Emergency Department (HOSPITAL_BASED_OUTPATIENT_CLINIC_OR_DEPARTMENT_OTHER): Payer: Medicare Other

## 2020-02-03 DIAGNOSIS — Z79899 Other long term (current) drug therapy: Secondary | ICD-10-CM | POA: Insufficient documentation

## 2020-02-03 DIAGNOSIS — Z7984 Long term (current) use of oral hypoglycemic drugs: Secondary | ICD-10-CM | POA: Diagnosis not present

## 2020-02-03 DIAGNOSIS — R072 Precordial pain: Secondary | ICD-10-CM | POA: Insufficient documentation

## 2020-02-03 DIAGNOSIS — Z7982 Long term (current) use of aspirin: Secondary | ICD-10-CM | POA: Diagnosis not present

## 2020-02-03 DIAGNOSIS — E1169 Type 2 diabetes mellitus with other specified complication: Secondary | ICD-10-CM | POA: Insufficient documentation

## 2020-02-03 DIAGNOSIS — R079 Chest pain, unspecified: Secondary | ICD-10-CM

## 2020-02-03 DIAGNOSIS — I10 Essential (primary) hypertension: Secondary | ICD-10-CM | POA: Insufficient documentation

## 2020-02-03 DIAGNOSIS — Z91040 Latex allergy status: Secondary | ICD-10-CM | POA: Diagnosis not present

## 2020-02-03 DIAGNOSIS — E785 Hyperlipidemia, unspecified: Secondary | ICD-10-CM | POA: Insufficient documentation

## 2020-02-03 DIAGNOSIS — Z96651 Presence of right artificial knee joint: Secondary | ICD-10-CM | POA: Diagnosis not present

## 2020-02-03 DIAGNOSIS — Z794 Long term (current) use of insulin: Secondary | ICD-10-CM | POA: Insufficient documentation

## 2020-02-03 LAB — BASIC METABOLIC PANEL
Anion gap: 10 (ref 5–15)
BUN: 13 mg/dL (ref 8–23)
CO2: 23 mmol/L (ref 22–32)
Calcium: 9.2 mg/dL (ref 8.9–10.3)
Chloride: 104 mmol/L (ref 98–111)
Creatinine, Ser: 0.69 mg/dL (ref 0.44–1.00)
GFR, Estimated: >60 mL/min (ref >60)
Glucose, Bld: 132 mg/dL — ABNORMAL HIGH (ref 70–99)
Potassium: 3.8 mmol/L (ref 3.5–5.1)
Sodium: 137 mmol/L (ref 135–145)

## 2020-02-03 LAB — CBC
HCT: 40.6 % (ref 36.0–46.0)
Hemoglobin: 13.2 g/dL (ref 12.0–15.0)
MCH: 30.7 pg (ref 26.0–34.0)
MCHC: 32.5 g/dL (ref 30.0–36.0)
MCV: 94.4 fL (ref 80.0–100.0)
Platelets: 173 10*3/uL (ref 150–400)
RBC: 4.3 MIL/uL (ref 3.87–5.11)
RDW: 13.5 % (ref 11.5–15.5)
WBC: 13.8 10*3/uL — ABNORMAL HIGH (ref 4.0–10.5)
nRBC: 0 % (ref 0.0–0.2)

## 2020-02-03 LAB — TROPONIN I (HIGH SENSITIVITY)
Troponin I (High Sensitivity): 5 ng/L (ref <18)
Troponin I (High Sensitivity): 4 ng/L (ref <18)

## 2020-02-03 NOTE — ED Triage Notes (Signed)
Pt reports last night with left sided chest pain, arm pain and jaw pain.  Pt had biopsy done for temporal arthritis recently.

## 2020-02-03 NOTE — ED Provider Notes (Signed)
[facility] Provider Note   CSN: [id number] Arrival date & time: 02/03/20  1020     History Chief Complaint  Patient presents with  . Chest Pain    Kara Lee is a 74 y.o. female.  The history is provided by the patient. No language interpreter was used.  Chest Pain Pain location:  Substernal area and epigastric Pain quality: sharp and stabbing   Pain radiates to:  Does not radiate Pain severity:  Severe Onset quality:  Sudden Duration:  1 hour Progression:  Resolved Relieved by:  Nothing Worsened by:  Nothing Pt reports she awoke with pain in her chest.  Pt reports cpap was pinching her face.  Pt reports pain felt lie reflux.  Pt resolved and has not returned today.  Pt reports she has been on high does steroids for possible temporal arteritis. Biopsy negative.     Past Medical History:  Diagnosis Date  . Anxiety   . Complication of anesthesia   . Concussion November 09, 2012   From a fall at home on deck  . Depression    postpartum depression after 2nd child  . Diabetes mellitus ([place])   . Fatty liver   . GERD (gastroesophageal reflux disease)   . HLD (hyperlipidemia)   . Hypertension   . Morbid obesity ([place])   . OSA on CPAP    uses CPAP nightly  . Osteoarthritis of right knee 07/31/2013  . PONV (postoperative nausea and vomiting)   . Rosacea   . Severe aortic stenosis     Patient Active Problem List   Diagnosis Date Noted  . Diabetes mellitus type 2 in obese ([place]) 10/24/2019  . Proteinuria 10/24/2019  . S/P TAVR (transcatheter aortic valve replacement) Transcatheter Aortic Valve Replacement - Percutaneous Transfemoral Approach Edwards Sapien 3 Ultra THV (size 26 mm ) 01/30/2019 01/30/2019  . Diabetes mellitus ([place])   . GERD (gastroesophageal reflux disease)   . HLD (hyperlipidemia)   . OSA on CPAP 10/02/2017  . Morbid obesity ([employer]) 10/02/2017  . Severe aortic stenosis   . Osteoarthritis of right knee 07/31/2013     Past Surgical History:  Procedure Laterality Date  . ABDOMINAL HYSTERECTOMY     still has uterus  . ANKLE RECONSTRUCTION Left   . ARTERY BIOPSY Left 01/21/2020   Procedure: BIOPSY TEMPORAL ARTERY;  Surgeon: Leta Baptist, MD;  Location: [facility];  Service: ENT;  Laterality: Left;  . BIOPSY BREAST Bilateral   . 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 CESAREAN SECTION     x 2  . CHOLECYSTECTOMY    . COLONOSCOPY    . EYE SURGERY     lasik; X3 for crossed eyes; 1 eyelid surgery  . HERNIA REPAIR    . PARTIAL KNEE ARTHROPLASTY Right 07/31/2013   Procedure: RIGHT UNICOMPARTMENTAL KNEE;  Surgeon: Johnny Bridge, MD;  Location: [facility];  Service: Orthopedics;  Laterality: Right;  . RIGHT/LEFT HEART CATH AND CORONARY ANGIOGRAPHY N/A 10/04/2017   Procedure: RIGHT/LEFT HEART CATH AND CORONARY ANGIOGRAPHY;  Surgeon: Adrian Prows, MD;  Location: [facility];  Service: Cardiovascular;  Laterality: N/A;  . TEE WITHOUT CARDIOVERSION N/A 08/31/2017   Procedure: TRANSESOPHAGEAL ECHOCARDIOGRAM (TEE);  Surgeon: Adrian Prows, MD;  Location: [facility];  Service: Cardiovascular;  Laterality: N/A;  . TEE WITHOUT CARDIOVERSION N/A 01/30/2019   Procedure: TRANSESOPHAGEAL ECHOCARDIOGRAM (TEE);  Surgeon: Sherren Mocha, MD;  Location: [facility];  Service: Open Heart  Surgery;  Laterality: N/A;  . TRANSCATHETER AORTIC VALVE REPLACEMENT, TRANSFEMORAL N/A 01/30/2019   Procedure: TRANSCATHETER AORTIC VALVE REPLACEMENT, TRANSFEMORAL;  Surgeon: Sherren Mocha, MD;  Location: [facility];  Service: Open Heart Surgery;  Laterality: N/A;  . TUBAL LIGATION       OB History   No obstetric history on file.     Family History  Problem Relation Age of Onset  . Pancreatitis Mother   . COPD Mother   . Cancer - Lung Mother   . Diabetes type II Mother   . Breast cancer Maternal Grandmother   . Cataracts Maternal Grandfather     Social History   Tobacco Use  . Smoking status:  Never Smoker  . Smokeless tobacco: Never Used  Vaping Use  . Vaping Use: Never used  Substance Use Topics  . Alcohol use: Not Currently  . Drug use: No    Home Medications Prior to Admission medications   Medication Sig Start Date End Date Taking? Authorizing Provider  acetaminophen (TYLENOL) 650 MG CR tablet Take 1,300 mg by mouth every 8 (eight) hours as needed for pain.    [provider]  aspirin EC 81 MG tablet Take 81 mg by mouth at bedtime.     [provider]  atenolol (TENORMIN) 25 MG tablet TAKE 1 TABLET BY MOUTH TWICE DAILY 10/05/19   Adrian Prows, MD  clonazePAM (KLONOPIN) 0.5 MG tablet Take 1/2 by mouth twice daily    [provider]  diphenhydrAMINE (BENADRYL ALLERGY) 25 mg capsule Take 25 mg by mouth as needed.    [provider]  esomeprazole (NEXIUM) 40 MG capsule Take 40 mg by mouth daily. 08/20/19   [provider]  estradiol (ESTRACE) 0.1 MG/GM vaginal cream Place 1 Applicatorful vaginally as needed (irritation).  12/16/16   [provider]  FLUoxetine (PROZAC) 40 MG capsule Take 40 mg by mouth daily. 11/14/17   [provider]  fluticasone (FLONASE) 50 MCG/ACT nasal spray Place 1 spray into both nostrils daily as needed for allergies.  09/05/18   [provider]  hydrocortisone (ANUSOL-HC) 2.5 % rectal cream Place 1 application rectally daily as needed for hemorrhoids.     [provider]  insulin glargine (LANTUS) 100 UNIT/ML injection Inject 10 Units into the skin daily. Sliding scale whil on prednisone    [provider]  losartan (COZAAR) 25 MG tablet TAKE 1 TABLET(25 MG) BY MOUTH DAILY 01/18/20   Wendling, Crosby Oyster, DO  metFORMIN (GLUCOPHAGE-XR) 500 MG 24 hr tablet Take 1 tablet (500 mg total) by mouth 2 (two) times daily. 10/06/17   Adrian Prows, MD  methocarbamol (ROBAXIN) 500 MG tablet Take 500 mg by mouth every 6 (six) hours as needed for muscle spasms.    [provider]   Pitavastatin Calcium (LIVALO) 4 MG TABS Take 1 tablet (4 mg total) by mouth daily. Patient taking differently: Take 4 mg by mouth. TAKES 1 TAB 2X/WEEK 09/10/19   Adrian Prows, MD  predniSONE (DELTASONE) 10 MG tablet Take 3 tablets (30 mg total) by mouth daily with breakfast for 25 days, THEN 2 tablets (20 mg total) daily with breakfast for 30 days, THEN 1 tablet (10 mg total) daily with breakfast. 01/28/20 04/22/20  Wendling, Crosby Oyster, DO  Propylene Glycol (SYSTANE BALANCE) 0.6 % SOLN Place 1 drop into both eyes daily as needed (dry eyes).    [provider]  sitaGLIPtin (JANUVIA) 100 MG tablet Take 100 mg by mouth daily.  [provider]    Allergies    Bee venom, Latex, Lovenox [enoxaparin sodium], Minocycline, Morphine and related, Nickel, Shellfish allergy, Statins, Sulfa antibiotics, Tape, and Glimepiride  Review of Systems   Review of Systems  Cardiovascular: Positive for chest pain.  All other systems reviewed and are negative.   Physical Exam Updated Vital Signs BP (!) 117/53 (BP Location: Right Arm)   Pulse 73   Temp 98.9 F (37.2 C) (Oral)   Resp 18   Ht 5\' 2"  (1.575 m)   Wt 107.7 kg   SpO2 95%   BMI 43.44 kg/m   Physical Exam Vitals and nursing note reviewed.  Constitutional:      Appearance: She is well-developed.  HENT:     Head: Normocephalic.  Cardiovascular:     Rate and Rhythm: Normal rate and regular rhythm.     Heart sounds: Normal heart sounds.  Pulmonary:     Effort: Pulmonary effort is normal.  Abdominal:     General: There is no distension.     Palpations: Abdomen is soft.  Musculoskeletal:        General: Normal range of motion.     Cervical back: Normal range of motion.  Skin:    General: Skin is warm.  Neurological:     General: No focal deficit present.     Mental Status: She is alert and oriented to person, place, and time.  Psychiatric:        Mood and Affect: Mood normal.     ED Results / Procedures /  Treatments   Labs (all labs ordered are listed, but only abnormal results are displayed) Labs Reviewed  BASIC METABOLIC PANEL - Abnormal; Notable for the following components:      Result Value   Glucose, Bld 132 (*)    All other components within normal limits  CBC - Abnormal; Notable for the following components:   WBC 13.8 (*)    All other components within normal limits  TROPONIN I (HIGH SENSITIVITY)  TROPONIN I (HIGH SENSITIVITY)    EKG EKG Interpretation  Date/Time:  Sunday February 03 2020 10:35:48 EDT Ventricular Rate:  72 PR Interval:  148 QRS Duration: 116 QT Interval:  388 QTC Calculation: 424 R Axis:   -42 Text Interpretation: Normal sinus rhythm Left axis deviation Left ventricular hypertrophy with QRS widening ( R in aVL , Cornell product , Romhilt-Estes ) Abnormal ECG When compared to prior, similar appearaence. No STEMI Confirmed by Antony Blackbird [phone]) on 02/03/2020 2:56:58 PM   Radiology DG Chest 2 View  Result Date: 02/03/2020 CLINICAL DATA:  Chest pain EXAM: CHEST - 2 VIEW COMPARISON:  01/30/2019 FINDINGS: Lungs are clear.  No pleural effusion or pneumothorax. The heart is normal in size.  Status post aortic valve replacement. Degenerative changes of the visualized thoracolumbar spine. Cholecystectomy clips. IMPRESSION: Normal chest radiographs. Electronically Signed   By: Julian Hy M.D.   On: 02/03/2020 11:35    Procedures Procedures (including critical care time)  Medications Ordered in ED Medications - No data to display  ED Course  I have reviewed the triage vital signs and the nursing notes.  Pertinent labs & imaging results that were available during my care of the patient were reviewed by me and considered in my medical decision making (see chart for details).    MDM Rules/Calculators/A&P  MDM:  EKG uncahnged no acute abnormality, troponin negative x 2.  Dt. tegler in to see.  I suspect symptoms from reflux.   Pt advised to follow up with MD for recheck this week.  Return if any problems.  Final Clinical Impression(s) / ED Diagnoses Final diagnoses:  Nonspecific chest pain    Rx / DC Orders ED Discharge Orders    None    An After Visit Summary was printed and given to the patient.    Fransico Meadow, [place] 02/03/20 1931    Tegeler, Gwenyth Allegra, MD 02/04/20 [phone]

## 2020-02-03 NOTE — Discharge Instructions (Addendum)
Call your Physician tomorrow to schedule to be seen.  Return if any problems.

## 2020-02-04 NOTE — Telephone Encounter (Signed)
From pt

## 2020-02-11 ENCOUNTER — Other Ambulatory Visit (HOSPITAL_BASED_OUTPATIENT_CLINIC_OR_DEPARTMENT_OTHER): Payer: Self-pay | Admitting: Internal Medicine

## 2020-02-11 ENCOUNTER — Other Ambulatory Visit: Payer: Self-pay

## 2020-02-11 ENCOUNTER — Encounter: Payer: Self-pay | Admitting: Family Medicine

## 2020-02-11 ENCOUNTER — Ambulatory Visit: Payer: Medicare Other | Attending: Internal Medicine

## 2020-02-11 ENCOUNTER — Ambulatory Visit: Payer: Medicare Other | Admitting: Family Medicine

## 2020-02-11 VITALS — BP 108/62 | HR 89 | Temp 98.4°F | Ht 62.0 in | Wt 239.5 lb

## 2020-02-11 DIAGNOSIS — F321 Major depressive disorder, single episode, moderate: Secondary | ICD-10-CM | POA: Diagnosis not present

## 2020-02-11 DIAGNOSIS — K219 Gastro-esophageal reflux disease without esophagitis: Secondary | ICD-10-CM

## 2020-02-11 DIAGNOSIS — F411 Generalized anxiety disorder: Secondary | ICD-10-CM

## 2020-02-11 DIAGNOSIS — Z23 Encounter for immunization: Secondary | ICD-10-CM

## 2020-02-11 MED ORDER — CLONAZEPAM 0.5 MG PO TABS: 0.2500 mg | ORAL_TABLET | Freq: Two times a day (BID) | ORAL | Status: DC | PRN

## 2020-02-11 MED ORDER — ESCITALOPRAM OXALATE 20 MG PO TABS: 20.0000 mg | ORAL_TABLET | Freq: Every day | ORAL | 2 refills | Status: DC

## 2020-02-11 NOTE — Progress Notes (Signed)
Chief Complaint  Patient presents with  . Hospitalization Follow-up    Subjective: Patient is a 74 y.o. female here for ED f/u.  She is here with her spouse.  On 8/17, the patient was experiencing upper abdominal and lower chest pain.  She woke up out of her sleep with this.  She describes an upper abdominal/lower chest pain that radiated all the way around to her back.  She did have some spicy spaghetti 1 day prior that flared up her reflux.  She takes Nexium 40 mg daily.  She does not get chest pain or excessive shortness of breath with exertion.  She does have a cardiologist.  She is under more stress related to her family's health and the recent death of her sister.  She had an unremarkable work-up in the hospital.  The patient has a history of depression and anxiety.  She currently takes Prozac 40 mg daily.  She also takes Klonopin 0.25-0.5 mg twice daily as needed.  She is not having any adverse effects from the medication and is compliant.  She does not remember being on anything else specifically, but does note that something she was on in the past made her feel very tired.  She does not want to be on anything like that.  She does not follow with a counselor or psychologist.  No homicidal or suicidal ideation.  No self medication.  Past Medical History:  Diagnosis Date  . Anxiety   . Complication of anesthesia   . Concussion November 09, 2012   From a fall at home on deck  . Depression    postpartum depression after 2nd child  . Diabetes mellitus ([place])   . Fatty liver   . GERD (gastroesophageal reflux disease)   . HLD (hyperlipidemia)   . Hypertension   . Morbid obesity ([place])   . OSA on CPAP    uses CPAP nightly  . Osteoarthritis of right knee 07/31/2013  . PONV (postoperative nausea and vomiting)   . Rosacea   . Severe aortic stenosis     Objective: BP 108/62 (BP Location: Left Arm, Patient Position: Sitting, Cuff Size: Large)   Pulse 89   Temp 98.4 F (36.9 C) (Oral)   Ht  5\' 2"  (1.575 m)   Wt 239 lb 8 oz (108.6 kg)   SpO2 94%   BMI 43.81 kg/m  General: Awake, appears stated age HEENT: MMM, EOMi Heart: RRR Abdomen: Bowel sounds present, soft, mildly distended, nontender, no masses or organomegaly Lungs: CTAB, no rales, wheezes or rhonchi. No accessory muscle use Psych: Age appropriate judgment and insight, normal affect and mood  Assessment and Plan: Depression, major, single episode, moderate (HCC)  GAD (generalized anxiety disorder)  Gastroesophageal reflux disease, unspecified whether esophagitis present  1/2.  Status is uncontrolled.  Change Prozac to Lexapro 20 mg daily.  Continue Klonopin as needed.  Counseling information provided.  Anxiety coping techniques written down as well.  Counseled on exercise.  I will see her in 1 month to follow-up. 3.  We discussed changing her Nexium, but since she has not had any issues, will hold off on making this change for now. The patient voiced understanding and agreement to the plan.  [place], DO 02/11/20  11:53 AM

## 2020-02-11 NOTE — Patient Instructions (Signed)
Please consider counseling. Contact [phone] to schedule an appointment or inquire about cost/insurance coverage.  Aim to do some physical exertion for 150 minutes per week. This is typically divided into 5 days per week, 30 minutes per day. The activity should be enough to get your heart rate up. Anything is better than nothing if you have time constraints.  Coping skills Choose 5 that work for you: Take a deep breath Count to 20 Read a book Do a puzzle Meditate Bake Sing Knit Garden Pray Go outside Call a friend Listen to music Take a walk Color Send a note Take a bath Watch a movie Be alone in a quiet place Pet an animal Visit a friend Journal Exercise Stretch   

## 2020-02-11 NOTE — Progress Notes (Signed)
   Covid-19 Vaccination Clinic  Name:  Kara Lee    MRN: [id number] DOB: November 06, 1945  02/11/2020  Kara Lee was observed post Covid-19 immunization for 15 minutes without incident. She was provided with Vaccine Information Sheet and instruction to access the V-Safe system. Vaccinated by Toney Sang.  Kara Lee was instructed to call 911 with any severe reactions post vaccine: Marland Kitchen Difficulty breathing  . Swelling of face and throat  . A fast heartbeat  . A bad rash all over body  . Dizziness and weakness

## 2020-02-12 ENCOUNTER — Other Ambulatory Visit: Payer: Self-pay | Admitting: Family Medicine

## 2020-02-14 ENCOUNTER — Ambulatory Visit: Payer: Medicare Other

## 2020-02-15 MED FILL — PFIZER-BIONTECH COVID-19 VA: 30 | 1 days supply | Qty: 0 | Fill #0

## 2020-02-19 LAB — HM DIABETES EYE EXAM

## 2020-02-20 ENCOUNTER — Encounter: Payer: Self-pay | Admitting: Family Medicine

## 2020-02-20 ENCOUNTER — Telehealth (INDEPENDENT_AMBULATORY_CARE_PROVIDER_SITE_OTHER): Payer: Medicare Other | Admitting: Family Medicine

## 2020-02-20 ENCOUNTER — Other Ambulatory Visit: Payer: Self-pay

## 2020-02-20 VITALS — BP 123/71 | HR 86 | Temp 99.6°F | Ht 63.0 in | Wt 237.0 lb

## 2020-02-20 DIAGNOSIS — J0110 Acute frontal sinusitis, unspecified: Secondary | ICD-10-CM | POA: Diagnosis not present

## 2020-02-20 DIAGNOSIS — J208 Acute bronchitis due to other specified organisms: Secondary | ICD-10-CM

## 2020-02-20 DIAGNOSIS — B9689 Other specified bacterial agents as the cause of diseases classified elsewhere: Secondary | ICD-10-CM

## 2020-02-20 MED ORDER — AMOXICILLIN-POT CLAVULANATE 875-125 MG PO TABS: 1.0000 | ORAL_TABLET | Freq: Two times a day (BID) | ORAL | 0 refills | Status: AC

## 2020-02-20 NOTE — Progress Notes (Signed)
Chief Complaint  Patient presents with  . Cough  . Sore Throat    Kara Lee here for URI complaints. Due to COVID-19 pandemic, we are interacting via web portal for an electronic face-to-face visit. I verified patient's ID using 2 identifiers. Patient agreed to proceed with visit via this method. Patient is at home, I am at office. Patient and I are present for visit.   Duration: 3 days  Associated symptoms: sinus congestion, sinus pain, rhinorrhea, ear pain, sore throat, myalgia and low grade fevers (99.6), chills; O2 slightly lower in low 90's  Denies: itchy watery eyes, ear drainage, wheezing, shortness of breath and GI s/s's Treatment to date: Tylenol Sick contacts: No  Past Medical History:  Diagnosis Date  . Anxiety   . Complication of anesthesia   . Concussion November 09, 2012   From a fall at home on deck  . Depression    postpartum depression after 2nd child  . Diabetes mellitus ([place])   . Fatty liver   . GERD (gastroesophageal reflux disease)   . HLD (hyperlipidemia)   . Hypertension   . Morbid obesity ([place])   . OSA on CPAP    uses CPAP nightly  . Osteoarthritis of right knee 07/31/2013  . PONV (postoperative nausea and vomiting)   . Rosacea   . Severe aortic stenosis     BP 123/71 (BP Location: Left Arm, Patient Position: Sitting, Cuff Size: Normal)   Pulse 86   Temp 99.6 F (37.6 C) (Oral)   Ht 5\' 3"  (1.6 m)   Wt 237 lb (107.5 kg)   SpO2 93%   BMI 41.98 kg/m  No conversational dyspnea Age appropriate judgment and insight Nml affect and mood  Acute bacterial bronchitis - Plan: amoxicillin-clavulanate (AUGMENTIN) 875-125 MG tablet  Acute frontal sinusitis, recurrence not specified - Plan: amoxicillin-clavulanate (AUGMENTIN) 875-125 MG tablet  Given low grade fevers and O2 sats, will tx for bacterial bronchitic, cover for bacterial sinusitis with Augmentin bid for 7 d.  Continue to push fluids, practice good hand hygiene, cover mouth when coughing. F/u  prn. If starting to experience fevers, shaking, or shortness of breath, seek immediate care. Pt voiced understanding and agreement to the plan.  [place], DO 02/20/20 11:49 AM

## 2020-02-22 ENCOUNTER — Other Ambulatory Visit: Payer: Medicare Other

## 2020-02-25 ENCOUNTER — Ambulatory Visit: Payer: Medicare Other | Admitting: Family Medicine

## 2020-02-29 ENCOUNTER — Other Ambulatory Visit: Payer: Self-pay

## 2020-02-29 ENCOUNTER — Ambulatory Visit: Payer: Medicare Other

## 2020-02-29 DIAGNOSIS — Z952 Presence of prosthetic heart valve: Secondary | ICD-10-CM

## 2020-02-29 DIAGNOSIS — I359 Nonrheumatic aortic valve disorder, unspecified: Secondary | ICD-10-CM

## 2020-03-03 ENCOUNTER — Telehealth: Payer: Self-pay | Admitting: *Deleted

## 2020-03-03 ENCOUNTER — Encounter: Payer: Self-pay | Admitting: Family Medicine

## 2020-03-03 ENCOUNTER — Ambulatory Visit: Payer: Medicare Other | Admitting: Family Medicine

## 2020-03-03 ENCOUNTER — Other Ambulatory Visit: Payer: Self-pay

## 2020-03-03 VITALS — BP 108/60 | HR 73 | Temp 98.3°F | Resp 12 | Ht 62.0 in | Wt 238.0 lb

## 2020-03-03 DIAGNOSIS — I10 Essential (primary) hypertension: Secondary | ICD-10-CM | POA: Diagnosis not present

## 2020-03-03 DIAGNOSIS — F321 Major depressive disorder, single episode, moderate: Secondary | ICD-10-CM

## 2020-03-03 DIAGNOSIS — F411 Generalized anxiety disorder: Secondary | ICD-10-CM | POA: Diagnosis not present

## 2020-03-03 NOTE — Patient Instructions (Signed)
If you do not hear anything about your referral in the next 1-2 weeks, call our office and ask for an update.  Please consider counseling. Contact [phone] to schedule an appointment or inquire about cost/insurance coverage.  Coping skills Choose 5 that work for you:  Take a deep breath  Count to 20  Read a book  Do a puzzle  Meditate  Bake  Sing  Knit  Garden  Pray  Go outside  Call a friend  Listen to music  Take a walk  Color  Send a note  Take a bath  Watch a movie  Be alone in a quiet place  Pet an animal  Visit a friend  Journal  Exercise  Stretch   Let us know if you need anything.

## 2020-03-03 NOTE — Telephone Encounter (Signed)
Left message on machine that her and her husband Josph Macho had forgot to get Pneumonia vaccines (Pneumovax) today.  Advised to call back to make nurse visit or get at next visit.  Also left message about paperwork (physician statement).  We have a duplicate copy of one sheet and Dr. Nani Ravens just wanted to make sure that it was just for patient and not for both her and her husband.

## 2020-03-03 NOTE — Progress Notes (Signed)
Chief Complaint  Patient presents with  . Diabetes    Subjective Kara Lee presents for f/u anxiety/depression.  Pt is currently being treated with Prozac 40 mg/d.  Reports doing OK since treatment. No thoughts of harming self or others. No self-medication with alcohol, prescription drugs or illicit drugs. Failed Lexapro.  Pt is not following with a counselor/psychologist.  Hypertension Patient presents for hypertension follow up. She does monitor home blood pressures. Blood pressures ranging on average from 110's/70's. She is compliant with medications- atenolol 25 mg bid, losartan 25 mg/d. Patient has these side effects of medication: none She is adhering to a healthy diet overall. Exercise: active at home  Past Medical History:  Diagnosis Date  . Anxiety   . Complication of anesthesia   . Concussion November 09, 2012   From a fall at home on deck  . Depression    postpartum depression after 2nd child  . Diabetes mellitus ([place])   . Fatty liver   . GERD (gastroesophageal reflux disease)   . HLD (hyperlipidemia)   . Hypertension   . Morbid obesity ([place])   . OSA on CPAP    uses CPAP nightly  . Osteoarthritis of right knee 07/31/2013  . PONV (postoperative nausea and vomiting)   . Rosacea   . Severe aortic stenosis    Allergies as of 03/03/2020      Reactions   Bee Venom Itching, Swelling   Latex Itching, Swelling   Lovenox [enoxaparin Sodium] Other (See Comments)   Bruising   Minocycline Nausea And Vomiting   Morphine And Related Nausea And Vomiting   Nickel Other (See Comments)   Contact dermatitis    Shellfish Allergy Itching   Statins    Muscle aches   Sulfa Antibiotics Nausea And Vomiting   Tape Itching   Bandaids and adhesive tape   Glimepiride Rash      Medication List       Accurate as of March 03, 2020  4:42 PM. If you have any questions, ask your nurse or doctor.        acetaminophen 650 MG CR tablet Commonly known as: TYLENOL Take  1,300 mg by mouth every 8 (eight) hours as needed for pain.   aspirin EC 81 MG tablet Take 81 mg by mouth at bedtime.   atenolol 25 MG tablet Commonly known as: TENORMIN TAKE 1 TABLET BY MOUTH TWICE DAILY   Benadryl Allergy 25 mg capsule Generic drug: diphenhydrAMINE Take 25 mg by mouth as needed.   clonazePAM 0.5 MG tablet Commonly known as: KLONOPIN Take 0.5-1 tablets (0.25-0.5 mg total) by mouth 2 (two) times daily as needed for anxiety. Take 1/2 by mouth twice daily   esomeprazole 40 MG capsule Commonly known as: NEXIUM Take 40 mg by mouth daily.   estradiol 0.1 MG/GM vaginal cream Commonly known as: ESTRACE Place 1 Applicatorful vaginally as needed (irritation).   FLUoxetine 40 MG capsule Commonly known as: PROZAC Take 40 mg by mouth daily.   fluticasone 50 MCG/ACT nasal spray Commonly known as: FLONASE Place 1 spray into both nostrils daily as needed for allergies.   hydrocortisone 2.5 % rectal cream Commonly known as: ANUSOL-HC Place 1 application rectally daily as needed for hemorrhoids.   insulin glargine 100 UNIT/ML injection Commonly known as: LANTUS Inject 10 Units into the skin daily. Sliding scale whil on prednisone   Livalo 4 MG Tabs Generic drug: Pitavastatin Calcium Take 1 tablet (4 mg total) by mouth daily. What changed:  when to take this  additional instructions   losartan 25 MG tablet Commonly known as: COZAAR TAKE 1 TABLET(25 MG) BY MOUTH DAILY   metFORMIN 500 MG 24 hr tablet Commonly known as: GLUCOPHAGE-XR Take 1 tablet (500 mg total) by mouth 2 (two) times daily.   predniSONE 10 MG tablet Commonly known as: DELTASONE Take 3 tablets (30 mg total) by mouth daily with breakfast for 25 days, THEN 2 tablets (20 mg total) daily with breakfast for 30 days, THEN 1 tablet (10 mg total) daily with breakfast. Start taking on: January 28, 2020   sitaGLIPtin 100 MG tablet Commonly known as: JANUVIA Take 100 mg by mouth daily.   Systane  Balance 0.6 % Soln Generic drug: Propylene Glycol Place 1 drop into both eyes daily as needed (dry eyes).       Exam BP 108/60 (BP Location: Right Arm, Cuff Size: Large)   Pulse 73   Temp 98.3 F (36.8 C) (Oral)   Resp 12   Ht 5\' 2"  (1.575 m)   Wt 238 lb (108 kg)   SpO2 94%   BMI 43.53 kg/m  General:  well developed, well nourished, in no apparent distress Heart: RRR, no LE edema Lungs:  CTAB. No respiratory distress Psych: well oriented with normal range of affect and age-appropriate judgement/insight, alert and oriented x4.  Assessment and Plan  Depression, major, single episode, moderate ([place]) - Plan: Ambulatory referral to Psychiatry  GAD (generalized anxiety disorder) - Plan: Ambulatory referral to Psychiatry  Essential hypertension  1/2- she has hesitancy to make a med change but her depression and affect are worsening. We will continue the Prozac 40 mg/d and Klonopin prn and refer to psych.  3. Cont losartan 25 mg/d and atenolol 25 mg bid.  F/u in 6 mo. The patient voiced understanding and agreement to the plan.  [place], DO 03/03/20 4:42 PM

## 2020-03-04 NOTE — Telephone Encounter (Signed)
Patient stated that form was only for her and that that she will come in for a nurse visit only.  She will pick up form at nurse visit on 11/23.

## 2020-03-10 ENCOUNTER — Other Ambulatory Visit: Payer: Self-pay

## 2020-03-10 ENCOUNTER — Encounter: Payer: Self-pay | Admitting: Cardiology

## 2020-03-10 ENCOUNTER — Ambulatory Visit: Payer: Medicare Other | Admitting: Cardiology

## 2020-03-10 VITALS — BP 123/59 | HR 74 | Resp 16 | Ht 63.0 in | Wt 239.0 lb

## 2020-03-10 DIAGNOSIS — I6523 Occlusion and stenosis of bilateral carotid arteries: Secondary | ICD-10-CM

## 2020-03-10 DIAGNOSIS — E7800 Pure hypercholesterolemia, unspecified: Secondary | ICD-10-CM

## 2020-03-10 DIAGNOSIS — I10 Essential (primary) hypertension: Secondary | ICD-10-CM

## 2020-03-10 DIAGNOSIS — Z952 Presence of prosthetic heart valve: Secondary | ICD-10-CM

## 2020-03-10 DIAGNOSIS — Z789 Other specified health status: Secondary | ICD-10-CM

## 2020-03-10 MED ORDER — LIVALO 4 MG PO TABS: 4.0000 mg | ORAL_TABLET | Freq: Every day | ORAL | 6 refills | Status: DC

## 2020-03-10 NOTE — Progress Notes (Signed)
Primary Physician/Referring:  Shelda Pal, DO  Patient ID: Kara Lee, female    DOB: [birth year], 74 y.o.   MRN: [id number]  Chief Complaint  Patient presents with  . Follow-up    1 year  . TAVR   HPI:    Kara Lee  is a 74 y.o. 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 She now presents for follow up.   Unfortunately over the past 6 months, she was diagnosed with possible trigeminal neuralgia and was started on high-dose steroids and had gained significant amount of weight.  She also contracted COVID-19 although she was vaccinated.  She is now on reduced dose of steroids, she was negative for biopsy with regard to trigeminal neuralgia, presently doing well and essentially remains asymptomatic.  She has not had any dyspnea on exertion, no leg edema, no PND or orthopnea.  Past Medical History:  Diagnosis Date  . Anxiety   . Complication of anesthesia   . Concussion November 09, 2012   From a fall at home on deck  . Depression    postpartum depression after 2nd child  . Diabetes mellitus ([place])   . Fatty liver   . GERD (gastroesophageal reflux disease)   . HLD (hyperlipidemia)   . Hypertension   . Morbid obesity ([place])   . OSA on CPAP    uses CPAP nightly  . Osteoarthritis of right knee 07/31/2013  . PONV (postoperative nausea and vomiting)   . Rosacea   . Severe aortic stenosis    Past Surgical History:  Procedure Laterality Date  . ABDOMINAL HYSTERECTOMY     still has uterus  . ANKLE RECONSTRUCTION Left   . ARTERY BIOPSY Left 01/21/2020   Procedure: BIOPSY TEMPORAL ARTERY;  Surgeon: Leta Baptist, MD;  Location: [facility];  Service: ENT;  Laterality: Left;  . BIOPSY BREAST Bilateral   . 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 CESAREAN SECTION     x 2  . CHOLECYSTECTOMY    .  COLONOSCOPY    . EYE SURGERY     lasik; X3 for crossed eyes; 1 eyelid surgery  . HERNIA REPAIR    . PARTIAL KNEE ARTHROPLASTY Right 07/31/2013   Procedure: RIGHT UNICOMPARTMENTAL KNEE;  Surgeon: Johnny Bridge, MD;  Location: [facility];  Service: Orthopedics;  Laterality: Right;  . RIGHT/LEFT HEART CATH AND CORONARY ANGIOGRAPHY N/A 10/04/2017   Procedure: RIGHT/LEFT HEART CATH AND CORONARY ANGIOGRAPHY;  Surgeon: Adrian Prows, MD;  Location: [facility];  Service: Cardiovascular;  Laterality: N/A;  . TEE WITHOUT CARDIOVERSION N/A 08/31/2017   Procedure: TRANSESOPHAGEAL ECHOCARDIOGRAM (TEE);  Surgeon: Adrian Prows, MD;  Location: [facility];  Service: Cardiovascular;  Laterality: N/A;  . TEE WITHOUT CARDIOVERSION N/A 01/30/2019   Procedure: TRANSESOPHAGEAL ECHOCARDIOGRAM (TEE);  Surgeon: Sherren Mocha, MD;  Location: [facility];  Service: Open Heart Surgery;  Laterality: N/A;  . TRANSCATHETER AORTIC VALVE REPLACEMENT, TRANSFEMORAL N/A 01/30/2019   Procedure: TRANSCATHETER AORTIC VALVE REPLACEMENT, TRANSFEMORAL;  Surgeon: Sherren Mocha, MD;  Location: [facility];  Service: Open Heart Surgery;  Laterality: N/A;  . TUBAL LIGATION     Family History  Problem Relation Age of Onset  . Pancreatitis Mother   . COPD Mother   . Cancer - Lung Mother   . Diabetes type II Mother   .  Breast cancer Maternal Grandmother   . Cataracts Maternal Grandfather     Social History   Tobacco Use  . Smoking status: Never Smoker  . Smokeless tobacco: Never Used  Substance Use Topics  . Alcohol use: Not Currently   Marital Status: Married   ROS  Review of Systems  Constitutional: Positive for weight gain (was on high dose steroids).  Cardiovascular: Negative for dyspnea on exertion, leg swelling and syncope.  Gastrointestinal: Negative for melena.   Objective  Blood pressure (!) 123/59, pulse 74, resp. rate 16, height 5\' 3"  (1.6 m), weight 239 lb (108.4 kg), SpO2 94 %.  Vitals with  BMI 03/10/2020 03/03/2020 02/20/2020  Height 5\' 3"  5\' 2"  5\' 3"   Weight 239 lbs 238 lbs 237 lbs  BMI 42.35 65.99 35.70  Systolic 177 939 030  Diastolic 59 60 71  Pulse 74 73 86     Physical Exam Constitutional:      General: She is not in acute distress.    Appearance: She is well-developed. She is obese.  HENT:     Head: Normocephalic and atraumatic.  Cardiovascular:     Rate and Rhythm: Normal rate and regular rhythm.     Pulses: Intact distal pulses.          Carotid pulses are on the right side with bruit and on the left side with bruit.      Femoral pulses are 1+ on the right side and 1+ on the left side.      Popliteal pulses are 1+ on the right side and 1+ on the left side.       Dorsalis pedis pulses are 2+ on the right side and 2+ on the left side.       Posterior tibial pulses are 0 on the right side and 0 on the left side.     Heart sounds: Murmur heard.  Early systolic murmur is present with a grade of 1/6 at the upper right sternal border.  No gallop.      Comments: No leg edema, no JVD.  Pulmonary:     Effort: Pulmonary effort is normal. No accessory muscle usage or respiratory distress.     Breath sounds: Normal breath sounds.  Abdominal:     General: Bowel sounds are normal.     Palpations: Abdomen is soft.    Laboratory examination:   Recent Labs    01/17/20 1100 02/03/20 1051  NA 136 137  K 4.6 3.8  CL 102 104  CO2 22 23  GLUCOSE 203* 132*  BUN 14 13  CREATININE 0.78 0.69  CALCIUM 9.4 9.2  GFRNONAA >60 >60  GFRAA >60  --    CrCl cannot be calculated (Patient's most recent lab result is older than the maximum 21 days allowed.).  CMP Latest Ref Rng & Units 02/03/2020 01/17/2020 01/31/2019  Glucose 70 - 99 mg/dL 132(H) 203(H) 142(H)  BUN 8 - 23 mg/dL 13 14 9   Creatinine 0.44 - 1.00 mg/dL 0.69 0.78 0.81  Sodium 135 - 145 mmol/L 137 136 138  Potassium 3.5 - 5.1 mmol/L 3.8 4.6 3.7  Chloride 98 - 111 mmol/L 104 102 105  CO2 22 - 32 mmol/L 23 22 22     Calcium 8.9 - 10.3 mg/dL 9.2 9.4 8.8(L)  Total Protein 6.5 - 8.1 g/dL - - -  Total Bilirubin 0.3 - 1.2 mg/dL - - -  Alkaline Phos 38 - 126 U/L - - -  AST 15 - 41 U/L - - -  ALT 0 - 44 U/L - - -   CBC Latest Ref Rng & Units 02/03/2020 01/31/2019 01/30/2019  WBC 4.0 - 10.5 K/uL 13.8(H) 9.4 -  Hemoglobin 12.0 - 15.0 g/dL 13.2 10.8(L) 12.2  Hematocrit 36 - 46 % 40.6 33.3(L) 36.0  Platelets 150 - 400 K/uL 173 176 -   Lipid Panel     Component Value Date/Time   CHOL 176 09/19/2019 1047   TRIG 155 (H) 09/19/2019 1047   HDL 49 09/19/2019 1047   LDLCALC 100 (H) 09/19/2019 1047   HEMOGLOBIN A1C Lab Results  Component Value Date   HGBA1C 7.3 (H) 09/19/2019   MPG 159.94 01/26/2019   TSH No results for input(s): TSH in the last 8760 hours.  External labs:   Labs 06/12/2019:  A1c is 7.4%.  Cholesterol, total 164.000 M 09/16/2017 Triglycerides 117.000 M 09/16/2017 HDL 57.000 M 09/16/2017 LDL-C 84.000 M 09/16/2017  Medications and allergies   Allergies  Allergen Reactions  . Bee Venom Itching and Swelling  . Latex Itching and Swelling  . Lovenox [Enoxaparin Sodium] Other (See Comments)    Bruising  . Minocycline Nausea And Vomiting  . Morphine And Related Nausea And Vomiting  . Nickel Other (See Comments)    Contact dermatitis   . Shellfish Allergy Itching  . Statins     Muscle aches  . Sulfa Antibiotics Nausea And Vomiting  . Tape Itching    Bandaids and adhesive tape  . Glimepiride Rash    Current Outpatient Medications on File Prior to Visit  Medication Sig Dispense Refill  . acetaminophen (TYLENOL) 650 MG CR tablet Take 1,300 mg by mouth every 8 (eight) hours as needed for pain.    Marland Kitchen aspirin EC 81 MG tablet Take 81 mg by mouth at bedtime.     Marland Kitchen atenolol (TENORMIN) 25 MG tablet TAKE 1 TABLET BY MOUTH TWICE DAILY 180 tablet 2  . clonazePAM (KLONOPIN) 0.5 MG tablet Take 0.5-1 tablets (0.25-0.5 mg total) by mouth 2 (two) times daily as needed for anxiety. Take 1/2 by  mouth twice daily 30 tablet   . diphenhydrAMINE (BENADRYL ALLERGY) 25 mg capsule Take 25 mg by mouth as needed.    Marland Kitchen esomeprazole (NEXIUM) 40 MG capsule Take 40 mg by mouth daily.    Marland Kitchen estradiol (ESTRACE) 0.1 MG/GM vaginal cream Place 1 Applicatorful vaginally as needed (irritation).     Marland Kitchen FLUoxetine (PROZAC) 40 MG capsule Take 40 mg by mouth daily.    . fluticasone (FLONASE) 50 MCG/ACT nasal spray Place 1 spray into both nostrils daily as needed for allergies.     . hydrocortisone (ANUSOL-HC) 2.5 % rectal cream Place 1 application rectally daily as needed for hemorrhoids.     . insulin glargine (LANTUS) 100 UNIT/ML injection Inject 10 Units into the skin daily. Sliding scale whil on prednisone    . losartan (COZAAR) 25 MG tablet TAKE 1 TABLET(25 MG) BY MOUTH DAILY 90 tablet 2  . metFORMIN (GLUCOPHAGE-XR) 500 MG 24 hr tablet Take 1 tablet (500 mg total) by mouth 2 (two) times daily.    . predniSONE (DELTASONE) 10 MG tablet Take 3 tablets (30 mg total) by mouth daily with breakfast for 25 days, THEN 2 tablets (20 mg total) daily with breakfast for 30 days, THEN 1 tablet (10 mg total) daily with breakfast. 165 tablet 0  . Propylene Glycol (SYSTANE BALANCE) 0.6 % SOLN Place 1 drop into both eyes daily as needed (dry eyes).    . sitaGLIPtin (JANUVIA) 100 MG  tablet Take 100 mg by mouth daily.     No current facility-administered medications on file prior to visit.    Radiology:   No results found.  Cardiac Studies:   Carotid artery duplex 2017/09/04: Minimal stenosis in the bilateral internal carotid artery (1-15%). No significant plaque.  Antegrade right vertebral artery flow. Antegrade left vertebral artery flow. F/U studies if clinically indicated. No significant change from 02/01/2015.  TEE 08/31/2017: - Left ventricle: There was mild concentric hypertrophy. Systolicfunction was normal. The estimated ejection fraction was in the range of 55% to 60%. - Aortic valve: Trileaflet;  moderately calcified leaflets. Therewas moderate stenosis. There was trivial regurgitation. Meangradient (S): 32 mm Hg.  Peak gradient (S): 65 mm Hg. Valve area(VTI): 0.78 cm^2. Valve area (Vmax): 1.04 cm^2. Valve area(Vmean): 0.96 cm^2. - Mitral valve: Mildly calcified annulus.  Lexiscan myoview stress test 09/16/2017: 1. Lexiscan stress test was performed. Exercise capacity was not assessed. Stress symptoms included dyspnea, dizziness and headache. Peak blood pressure 168/90 mmHg. Stress EKG is non diagnostic for ischemia as it is a pharmacologic stress. The stress electrocardiogram demonstrated sinus tachycardia, normal resting conduction, left ventricular hypertrophy, no resting arrhythmias and normal rest repolarization. 2. The overall quality of the study is good. Left ventricular cavity is noted to be normal on the rest and stress studies. Gated SPECT images reveal normal myocardial thickening and wall motion. The left ventricular ejection fraction was calculated or visually estimated to be 69%. SPECT images demonstrate predominantly reversible, medium sized, mild intensity perfusion defect. This likely suggests LCx/RPDA territory ischemia.  3. Intermediate risk study.  Right and left heart catheterization 10/04/2017: Hemodynamic data: Right heart catheterization: RA 5/3, mean 1 mmHg, RV 24/0, EDP 4 mmHg. PA 27/4, mean 50. PA saturation 64%. PW 12/16, mean 7 mmHg. Aortic saturation 98%. CO 4.67, CI 2.25, preserved.  Left heart catheterization hemodynamic data: LV 164/3, EDP 13 mmHg. Aortic pressure 105/52, mean 74 mmHg. Peak to peak pressure gradient of 27 mmHg, mean gradient 22.5 mm and valve area 0.99 cm.  Angiographic data: Normal coronary arteries, right dominant circulation.  Echocardiogram 02/29/2020:  Left ventricle cavity is normal in size. Moderate concentric hypertrophy of the left ventricle. Normal LV systolic function with EF 55-60%. Normal global wall motion.  Diastolic function could not be assessed due to mitral annular calcification.  Left atrial cavity is moderately dilated at 4.4 cm. Bioprosthetic aortic valve (TAVR) with trace aortic stenosis. No aortic valve regurgitation. Normal aortic valve leaflet mobility. Peak velocity  2.45m/s, Peak Gradient 22.8 mmHg, Mean Gradient 11.2 mmHg, AVA 1.96 cm. Moderate calcification of the mitral valve annulus. No evidence of mitral stenosis. Mild (Grade I) mitral regurgitation. Mildly restricted mitral valve leaflets. Mild tricuspid regurgitation. No evidence of pulmonary hypertension. Compared to 11/24/2018, TAVR (66mm Edwards Sapien bioprosthetic) is new.  EKG  EKG 03/10/2020: Normal sinus rhythm at rate of 70 bpm, left axis deviation, left anterior fascicular block. LVH by voltage criteria.  No significant change from EKG 09/10/2019   Assessment     ICD-10-CM   1. 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 Essential hypertension  I10 PCV ECHOCARDIOGRAM COMPLETE  3. Pure hypercholesterolemia  E78.00 Pitavastatin Calcium (LIVALO) 4 MG TABS    Lipid Panel With LDL/HDL Ratio  4. Carotid atherosclerosis, bilateral  I65.23 Pitavastatin Calcium (LIVALO) 4 MG TABS  5. Statin intolerance  Z78.9 Pitavastatin Calcium (LIVALO) 4 MG TABS     Meds ordered  this encounter  Medications  . Pitavastatin Calcium (LIVALO) 4 MG TABS    Sig: Take 1 tablet (4 mg total) by mouth daily.    Dispense:  30 tablet    Refill:  6    Medications Discontinued During This Encounter  Medication Reason  . Pitavastatin Calcium (LIVALO) 4 MG TABS Reorder    Recommendations:   Kara Lee  is a 74 y.o. 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 She now presents for annual follow up.   Although  she has had high-dose steroid treatment for possible trigeminal neuralgia, biopsy was negative she is now on tapered dose, she is losing weight.  She denies any dyspnea, I reviewed her echocardiogram, post TAVR echo looks very stable from last year and aortic valve is functioning normally.  She now has NYHA class I-II dyspnea improved from Class III to IV dyspnea prior to TAVR.  There is no clinical evidence of heart failure, no leg edema.  I reviewed her lipids, since she is diabetic and has mild carotid atherosclerosis, would prefer to have LDL closer to 70.  She is presently taking only 4 mg twice weekly of Livalo, will change to 2 mg daily and 4 mg on Tuesdays and Thursdays and will repeat lipids in 2 months, if LDL is at goal we will continue present dose otherwise we will gradually increase her statin dose to the maximum tolerated dose.  She has not been able to tolerate any other statins in the past.  I will see her back in 1 year with repeat echocardiogram.   Adrian Prows, MD, [facility] 03/10/2020, 3:23 PM Office: [phone] Pager: [phone]

## 2020-03-11 ENCOUNTER — Ambulatory Visit: Payer: Medicare Other

## 2020-03-12 ENCOUNTER — Ambulatory Visit: Payer: Medicare Other | Admitting: Family Medicine

## 2020-03-17 ENCOUNTER — Ambulatory Visit (INDEPENDENT_AMBULATORY_CARE_PROVIDER_SITE_OTHER): Payer: Medicare Other

## 2020-03-17 ENCOUNTER — Other Ambulatory Visit: Payer: Self-pay

## 2020-03-17 DIAGNOSIS — Z23 Encounter for immunization: Secondary | ICD-10-CM

## 2020-03-19 ENCOUNTER — Ambulatory Visit: Payer: Medicare Other | Admitting: Neurology

## 2020-03-20 ENCOUNTER — Other Ambulatory Visit: Payer: Self-pay

## 2020-03-20 DIAGNOSIS — Z789 Other specified health status: Secondary | ICD-10-CM

## 2020-03-20 DIAGNOSIS — I6523 Occlusion and stenosis of bilateral carotid arteries: Secondary | ICD-10-CM

## 2020-03-20 DIAGNOSIS — E7800 Pure hypercholesterolemia, unspecified: Secondary | ICD-10-CM

## 2020-03-20 MED ORDER — LIVALO 4 MG PO TABS: 4.0000 mg | ORAL_TABLET | Freq: Every day | ORAL | 3 refills | Status: DC

## 2020-03-21 ENCOUNTER — Other Ambulatory Visit: Payer: Self-pay

## 2020-03-21 DIAGNOSIS — I6523 Occlusion and stenosis of bilateral carotid arteries: Secondary | ICD-10-CM

## 2020-03-21 DIAGNOSIS — E7800 Pure hypercholesterolemia, unspecified: Secondary | ICD-10-CM

## 2020-03-21 DIAGNOSIS — Z789 Other specified health status: Secondary | ICD-10-CM

## 2020-03-21 MED ORDER — LIVALO 4 MG PO TABS: 4.0000 mg | ORAL_TABLET | Freq: Every day | ORAL | 3 refills | Status: DC

## 2020-03-25 ENCOUNTER — Ambulatory Visit: Payer: Medicare Other

## 2020-03-26 ENCOUNTER — Other Ambulatory Visit: Payer: Self-pay

## 2020-03-26 ENCOUNTER — Encounter: Payer: Self-pay | Admitting: Family Medicine

## 2020-03-26 ENCOUNTER — Telehealth: Payer: Medicare Other | Admitting: Family Medicine

## 2020-03-26 VITALS — BP 108/68 | HR 105 | Temp 98.1°F | Ht 62.0 in | Wt 241.0 lb

## 2020-03-26 DIAGNOSIS — J069 Acute upper respiratory infection, unspecified: Secondary | ICD-10-CM | POA: Diagnosis not present

## 2020-03-26 LAB — POCT RAPID STREP A (OFFICE): Rapid Strep A Screen: NEGATIVE

## 2020-03-26 MED ORDER — AZELASTINE HCL 0.1 % NA SOLN: 2.0000 | Freq: Two times a day (BID) | NASAL | 0 refills | Status: DC

## 2020-03-26 MED ORDER — PROMETHAZINE-DM 6.25-15 MG/5ML PO SYRP: 5.0000 mL | ORAL_SOLUTION | Freq: Four times a day (QID) | ORAL | 0 refills | Status: DC | PRN

## 2020-03-26 NOTE — Progress Notes (Signed)
Chief Complaint  Patient presents with  . Sore Throat  . Cough  . Generalized Body Aches    Kara Lee here for URI complaints.  Duration: 4 days  Associated symptoms: sinus congestion, sinus pain, rhinorrhea, ear pain, sore throat, myalgia and diarrhea, cough, nausea Denies: itchy watery eyes, ear fullness, ear drainage, wheezing, shortness of breath and fevers Treatment to date: Benadryl, Tylenol Sick contacts: No  Past Medical History:  Diagnosis Date  . Anxiety   . Complication of anesthesia   . Concussion November 09, 2012   From a fall at home on deck  . Depression    postpartum depression after 2nd child  . Diabetes mellitus ([place])   . Fatty liver   . GERD (gastroesophageal reflux disease)   . HLD (hyperlipidemia)   . Hypertension   . Morbid obesity ([place])   . OSA on CPAP    uses CPAP nightly  . Osteoarthritis of right knee 07/31/2013  . PONV (postoperative nausea and vomiting)   . Rosacea   . Severe aortic stenosis     BP 108/68 (BP Location: Left Arm, Patient Position: Sitting, Cuff Size: Large)   Pulse (!) 105   Temp 98.1 F (36.7 C) (Oral)   Ht 5\' 2"  (1.575 m)   Wt 241 lb (109.3 kg)   SpO2 95%   BMI 44.08 kg/m  General: Awake, alert, appears stated age HEENT: AT, [place], ears patent b/l and TM's neg, nares patent w/o discharge, pharynx slightly erythematous but without exudates, MMM Neck: No masses or asymmetry Heart: RRR Lungs: CTAB, no accessory muscle use Psych: Age appropriate judgment and insight, normal mood and affect  Viral URI with cough - Plan: promethazine-dextromethorphan (PROMETHAZINE-DM) 6.25-15 MG/5ML syrup, azelastine (ASTELIN) 0.1 % nasal spray, POCT rapid strep A  Rapid strep is negative.  0/4 Cent[facility] criteria.  Supportive care.  Warned about drowsiness associated with the start, she will take at night at first and if it does not make her drowsy will take during the day as well.  Continue Tylenol. Continue to push fluids, practice good hand  hygiene, cover mouth when coughing. F/u prn. If starting to experience fevers, shaking, or shortness of breath, seek immediate care. Pt voiced understanding and agreement to the plan.  [place], DO 03/26/20 4:34 PM

## 2020-03-26 NOTE — Patient Instructions (Signed)
It might not be a bad idea to get a covid test. Consider going to the pharmacy.   OK to take Tylenol 1000 mg (2 extra strength tabs) or 975 mg (3 regular strength tabs) every 6 hours as needed.  Continue to push fluids, practice good hand hygiene, and cover your mouth if you cough.  If you start having fevers, shaking or shortness of breath, seek immediate care.  Consider throat lozenges, salt water gargles and an air humidifier for symptomatic care.   Let us know if you need anything.

## 2020-03-28 ENCOUNTER — Telehealth: Payer: Medicare Other | Admitting: Family Medicine

## 2020-04-08 DIAGNOSIS — F411 Generalized anxiety disorder: Secondary | ICD-10-CM

## 2020-04-08 DIAGNOSIS — F321 Major depressive disorder, single episode, moderate: Secondary | ICD-10-CM

## 2020-04-08 MED ORDER — CLONAZEPAM 0.5 MG PO TABS: 0.2500 mg | ORAL_TABLET | Freq: Two times a day (BID) | ORAL | 1 refills | Status: DC | PRN

## 2020-04-16 NOTE — Progress Notes (Signed)
[facility] [address], [place], [place] [address] [phone]  Date:  04/17/2020   Name:  Kara Lee   DOB:  [birth year]   MRN:  [id number]  PCP:  Shelda Pal, DO    Chief Complaint: Abdominal Pain (All over abdominal pain, diarrhea since Christmas, no fever/)   History of Present Illness:  Kara Lee is a 74 y.o. very pleasant female patient who presents with the following:  Pt here today with abdominal pain Pt of Dr Nani Ravens History of DM under ok control, HTN (pt states she does not have HTN), s/p tavr, hyperlipidemia, obesity   Lab Results  Component Value Date   HGBA1C 7.3 (H) 09/19/2019   The pt notes that she seemed to have pulled her back around the Christmas holiday She has had 2 C/S, ?inguinal hernia repair with mesh and hysterectomy so she does have some lower abd scar tissue  The day after Christmas she notes some pain in her lower back Then developed diarrhea for about 3 days  Her back and upper belly feel better now- however she is now having some lower abd pain which has bene present for about 3 days  The pain is present more on the RLQ and suprapubic  No urinary sx noted No vomiting Mild nausea  No fever noted  She notes a decreased appetite over the last week No unusual foods   No history of pancreatitis or diverticulitis  She is s/p cholecystectomy    Patient Active Problem List   Diagnosis Date Noted  . Essential hypertension 03/03/2020  . GAD (generalized anxiety disorder) 03/03/2020  . Depression, major, single episode, moderate ([place]) 02/11/2020  . Diabetes mellitus type 2 in obese ([place]) 10/24/2019  . Proteinuria 10/24/2019  . S/P TAVR (transcatheter aortic valve replacement) Transcatheter Aortic Valve Replacement - Percutaneous Transfemoral Approach Edwards Sapien 3 Ultra THV (size 26 mm ) 01/30/2019 01/30/2019  . Diabetes mellitus ([place])   . GERD  (gastroesophageal reflux disease)   . HLD (hyperlipidemia)   . OSA on CPAP 10/02/2017  . Morbid obesity ([place]) 10/02/2017  . Severe aortic stenosis   . Osteoarthritis of right knee 07/31/2013    Past Medical History:  Diagnosis Date  . Anxiety   . Complication of anesthesia   . Concussion November 09, 2012   From a fall at home on deck  . Depression    postpartum depression after 2nd child  . Diabetes mellitus ([place])   . Fatty liver   . GERD (gastroesophageal reflux disease)   . HLD (hyperlipidemia)   . Hypertension   . Morbid obesity ([place])   . OSA on CPAP    uses CPAP nightly  . Osteoarthritis of right knee 07/31/2013  . PONV (postoperative nausea and vomiting)   . Rosacea   . Severe aortic stenosis     Past Surgical History:  Procedure Laterality Date  . ABDOMINAL HYSTERECTOMY     still has uterus  . ANKLE RECONSTRUCTION Left   . ARTERY BIOPSY Left 01/21/2020   Procedure: BIOPSY TEMPORAL ARTERY;  Surgeon: Leta Baptist, MD;  Location: [facility];  Service: ENT;  Laterality: Left;  . BIOPSY BREAST Bilateral   . 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 CESAREAN SECTION     x 2  . CHOLECYSTECTOMY    . COLONOSCOPY    . EYE SURGERY     lasik; X3  for crossed eyes; 1 eyelid surgery  . HERNIA REPAIR    . PARTIAL KNEE ARTHROPLASTY Right 07/31/2013   Procedure: RIGHT UNICOMPARTMENTAL KNEE;  Surgeon: Johnny Bridge, MD;  Location: [facility];  Service: Orthopedics;  Laterality: Right;  . RIGHT/LEFT HEART CATH AND CORONARY ANGIOGRAPHY N/A 10/04/2017   Procedure: RIGHT/LEFT HEART CATH AND CORONARY ANGIOGRAPHY;  Surgeon: Adrian Prows, MD;  Location: [facility];  Service: Cardiovascular;  Laterality: N/A;  . TEE WITHOUT CARDIOVERSION N/A 08/31/2017   Procedure: TRANSESOPHAGEAL ECHOCARDIOGRAM (TEE);  Surgeon: Adrian Prows, MD;  Location: [facility];  Service: Cardiovascular;  Laterality: N/A;  . TEE WITHOUT CARDIOVERSION N/A 01/30/2019   Procedure: TRANSESOPHAGEAL  ECHOCARDIOGRAM (TEE);  Surgeon: Sherren Mocha, MD;  Location: [facility];  Service: Open Heart Surgery;  Laterality: N/A;  . TRANSCATHETER AORTIC VALVE REPLACEMENT, TRANSFEMORAL N/A 01/30/2019   Procedure: TRANSCATHETER AORTIC VALVE REPLACEMENT, TRANSFEMORAL;  Surgeon: Sherren Mocha, MD;  Location: [facility];  Service: Open Heart Surgery;  Laterality: N/A;  . TUBAL LIGATION      Social History   Tobacco Use  . Smoking status: Never Smoker  . Smokeless tobacco: Never Used  Vaping Use  . Vaping Use: Never used  Substance Use Topics  . Alcohol use: Not Currently  . Drug use: No    Family History  Problem Relation Age of Onset  . Pancreatitis Mother   . COPD Mother   . Cancer - Lung Mother   . Diabetes type II Mother   . Breast cancer Maternal Grandmother   . Cataracts Maternal Grandfather     Allergies  Allergen Reactions  . Bee Venom Itching and Swelling  . Latex Itching and Swelling  . Lovenox [Enoxaparin Sodium] Other (See Comments)    Bruising  . Minocycline Nausea And Vomiting  . Morphine And Related Nausea And Vomiting  . Nickel Other (See Comments)    Contact dermatitis   . Shellfish Allergy Itching  . Statins     Muscle aches  . Sulfa Antibiotics Nausea And Vomiting  . Tape Itching    Bandaids and adhesive tape  . Glimepiride Rash    Medication list has been reviewed and updated.  Current Outpatient Medications on File Prior to Visit  Medication Sig Dispense Refill  . acetaminophen (TYLENOL) 650 MG CR tablet Take 1,300 mg by mouth every 8 (eight) hours as needed for pain.    Marland Kitchen aspirin EC 81 MG tablet Take 81 mg by mouth at bedtime.     Marland Kitchen atenolol (TENORMIN) 25 MG tablet TAKE 1 TABLET BY MOUTH TWICE DAILY 180 tablet 2  . azelastine (ASTELIN) 0.1 % nasal spray Place 2 sprays into both nostrils 2 (two) times daily. Use in each nostril as directed 30 mL 0  . clonazePAM (KLONOPIN) 0.5 MG tablet Take 0.5-1 tablets (0.25-0.5 mg total) by mouth  2 (two) times daily as needed for anxiety. Take 1/2 by mouth twice daily 90 tablet 1  . diphenhydrAMINE (BENADRYL) 25 mg capsule Take 25 mg by mouth as needed.    Marland Kitchen esomeprazole (NEXIUM) 40 MG capsule Take 40 mg by mouth daily.    Marland Kitchen estradiol (ESTRACE) 0.1 MG/GM vaginal cream Place 1 Applicatorful vaginally as needed (irritation).     Marland Kitchen FLUoxetine (PROZAC) 40 MG capsule Take 40 mg by mouth daily.    . fluticasone (FLONASE) 50 MCG/ACT nasal spray Place 1 spray into both nostrils daily as needed for allergies.     . hydrocortisone (ANUSOL-HC) 2.5 %  rectal cream Place 1 application rectally daily as needed for hemorrhoids.     . insulin glargine (LANTUS) 100 UNIT/ML injection Inject 10 Units into the skin daily. Sliding scale whil on prednisone    . losartan (COZAAR) 25 MG tablet TAKE 1 TABLET(25 MG) BY MOUTH DAILY 90 tablet 2  . metFORMIN (GLUCOPHAGE-XR) 500 MG 24 hr tablet Take 1 tablet (500 mg total) by mouth 2 (two) times daily.    . Pitavastatin Calcium (LIVALO) 4 MG TABS Take 1 tablet (4 mg total) by mouth daily. 90 tablet 3  . predniSONE (DELTASONE) 10 MG tablet Take 3 tablets (30 mg total) by mouth daily with breakfast for 25 days, THEN 2 tablets (20 mg total) daily with breakfast for 30 days, THEN 1 tablet (10 mg total) daily with breakfast. 165 tablet 0  . promethazine-dextromethorphan (PROMETHAZINE-DM) 6.25-15 MG/5ML syrup Take 5 mLs by mouth 4 (four) times daily as needed. 118 mL 0  . Propylene Glycol 0.6 % SOLN Place 1 drop into both eyes daily as needed (dry eyes).    . sitaGLIPtin (JANUVIA) 100 MG tablet Take 100 mg by mouth daily.     No current facility-administered medications on file prior to visit.    Review of Systems:  As per HPI- otherwise negative.   Physical Examination: Vitals:   04/17/20 1316  BP: 122/72  Pulse: 82  Resp: 17  Temp: 98.6 F (37 C)  SpO2: 97%   Vitals:   04/17/20 1316  Weight: 238 lb (108 kg)  Height: 5\' 2"  (1.575 m)   Body mass index is  43.53 kg/m. Ideal Body Weight: Weight in (lb) to have BMI = 25: 136.4  GEN: no acute distress.  Obese, looks well  HEENT: Atraumatic, Normocephalic.  Ears and Nose: No external deformity. CV: RRR, No M/G/R. No JVD. No thrill. No extra heart sounds. PULM: CTA B, no wheezes, crackles, rhonchi. No retractions. No resp. distress. No accessory muscle use. ABD: pt has significant tenderness in the RLQ and suprapubic abdomen.  She notes mild tenderness over the rest of her abdomen.  Mild guarding over lower abd only Normal BS  EXTR: No c/c/e PSYCH: Normally interactive. Conversant.   Results for orders placed or performed in visit on 04/17/20  POCT urinalysis dipstick  Result Value Ref Range   Color, UA yellow yellow   Clarity, UA cloudy (A) clear   Glucose, UA =250 (A) negative mg/dL   Bilirubin, UA negative negative   Ketones, POC UA negative negative mg/dL   Spec Grav, UA 1.015 1.010 - 1.025   Blood, UA negative negative   pH, UA 6.0 5.0 - 8.0   Protein Ur, POC =30 (A) negative mg/dL   Urobilinogen, UA 0.2 0.2 or 1.0 E.U./dL   Nitrite, UA Negative Negative   Leukocytes, UA Negative Negative    Assessment and Plan: Pain, abdominal, RLQ  Suprapubic pain - Plan: Urine Culture, POCT urinalysis dipstick, CBC, Comprehensive metabolic panel  Pt here today with RLQ/ suprapubic pain  Concern for possible appendicitis I am not able to order CT scan without first getting renal function labs.  Explained to pt that I cannot be sure we will be able to get an outpt CT done for her today We plan to have her seen in the ER.  If she ends up leaving the ER (she is concerned about waiting esp with covid) she will have labs drawn and I can attempt to get an outpt CT tomorrow.  Explained to pt that  this may be impossible due to holiday.  This visit occurred during the SARS-CoV-2 public health emergency.  Safety protocols were in place, including screening questions prior to the visit, additional usage  of staff PPE, and extensive cleaning of exam room while observing appropriate contact time as indicated for disinfecting solutions.    Signed Abbe Amsterdam, MD

## 2020-04-17 ENCOUNTER — Emergency Department (HOSPITAL_BASED_OUTPATIENT_CLINIC_OR_DEPARTMENT_OTHER)
Admission: EM | Admit: 2020-04-17 | Discharge: 2020-04-17 | Disposition: A | Discharge status: Home / Self Care | Payer: Medicare Other | Attending: Emergency Medicine | Admitting: Emergency Medicine

## 2020-04-17 ENCOUNTER — Other Ambulatory Visit: Payer: Self-pay

## 2020-04-17 ENCOUNTER — Emergency Department (HOSPITAL_BASED_OUTPATIENT_CLINIC_OR_DEPARTMENT_OTHER): Payer: Medicare Other

## 2020-04-17 ENCOUNTER — Ambulatory Visit: Payer: Medicare Other | Admitting: Family Medicine

## 2020-04-17 ENCOUNTER — Encounter (HOSPITAL_BASED_OUTPATIENT_CLINIC_OR_DEPARTMENT_OTHER): Payer: Self-pay | Admitting: *Deleted

## 2020-04-17 VITALS — BP 122/72 | HR 82 | Temp 98.6°F | Resp 17 | Ht 62.0 in | Wt 238.0 lb

## 2020-04-17 DIAGNOSIS — Z96651 Presence of right artificial knee joint: Secondary | ICD-10-CM | POA: Insufficient documentation

## 2020-04-17 DIAGNOSIS — K5792 Diverticulitis of intestine, part unspecified, without perforation or abscess without bleeding: Secondary | ICD-10-CM | POA: Diagnosis not present

## 2020-04-17 DIAGNOSIS — R1031 Right lower quadrant pain: Secondary | ICD-10-CM

## 2020-04-17 DIAGNOSIS — Z7982 Long term (current) use of aspirin: Secondary | ICD-10-CM | POA: Insufficient documentation

## 2020-04-17 DIAGNOSIS — I10 Essential (primary) hypertension: Secondary | ICD-10-CM | POA: Insufficient documentation

## 2020-04-17 DIAGNOSIS — R1032 Left lower quadrant pain: Secondary | ICD-10-CM | POA: Diagnosis present

## 2020-04-17 DIAGNOSIS — E1169 Type 2 diabetes mellitus with other specified complication: Secondary | ICD-10-CM | POA: Diagnosis not present

## 2020-04-17 DIAGNOSIS — Z7984 Long term (current) use of oral hypoglycemic drugs: Secondary | ICD-10-CM | POA: Diagnosis not present

## 2020-04-17 DIAGNOSIS — R102 Pelvic and perineal pain: Secondary | ICD-10-CM | POA: Diagnosis not present

## 2020-04-17 DIAGNOSIS — Z794 Long term (current) use of insulin: Secondary | ICD-10-CM | POA: Insufficient documentation

## 2020-04-17 DIAGNOSIS — Z79899 Other long term (current) drug therapy: Secondary | ICD-10-CM | POA: Diagnosis not present

## 2020-04-17 DIAGNOSIS — R1030 Lower abdominal pain, unspecified: Secondary | ICD-10-CM

## 2020-04-17 DIAGNOSIS — Z91040 Latex allergy status: Secondary | ICD-10-CM | POA: Insufficient documentation

## 2020-04-17 LAB — POCT URINALYSIS DIP (MANUAL ENTRY)
Bilirubin, UA: NEGATIVE
Blood, UA: NEGATIVE
Glucose, UA: 250 mg/dL — AB
Ketones, POC UA: NEGATIVE mg/dL
Leukocytes, UA: NEGATIVE
Nitrite, UA: NEGATIVE
Protein Ur, POC: 30 mg/dL — AB
Spec Grav, UA: 1.015 (ref 1.010–1.025)
Urobilinogen, UA: 0.2 E.U./dL
pH, UA: 6 (ref 5.0–8.0)

## 2020-04-17 LAB — COMPREHENSIVE METABOLIC PANEL
ALT: 28 U/L (ref 0–44)
AST: 37 U/L (ref 15–41)
Albumin: 3.8 g/dL (ref 3.5–5.0)
Alkaline Phosphatase: 47 U/L (ref 38–126)
Anion gap: 11 (ref 5–15)
BUN: 12 mg/dL (ref 8–23)
CO2: 21 mmol/L — ABNORMAL LOW (ref 22–32)
Calcium: 9.1 mg/dL (ref 8.9–10.3)
Chloride: 98 mmol/L (ref 98–111)
Creatinine, Ser: 0.56 mg/dL (ref 0.44–1.00)
GFR, Estimated: >60 mL/min (ref >60)
Glucose, Bld: 255 mg/dL — ABNORMAL HIGH (ref 70–99)
Potassium: 4.4 mmol/L (ref 3.5–5.1)
Sodium: 130 mmol/L — ABNORMAL LOW (ref 135–145)
Total Bilirubin: 0.5 mg/dL (ref 0.3–1.2)
Total Protein: 7.3 g/dL (ref 6.5–8.1)

## 2020-04-17 LAB — CBC WITH DIFFERENTIAL/PLATELET
Abs Immature Granulocytes: 0.07 10*3/uL (ref 0.00–0.07)
Basophils Absolute: 0.1 10*3/uL (ref 0.0–0.1)
Basophils Relative: 0 %
Eosinophils Absolute: 0.1 10*3/uL (ref 0.0–0.5)
Eosinophils Relative: 0 %
HCT: 38.2 % (ref 36.0–46.0)
Hemoglobin: 12.3 g/dL (ref 12.0–15.0)
Immature Granulocytes: 1 %
Lymphocytes Relative: 11 %
Lymphs Abs: 1.5 10*3/uL (ref 0.7–4.0)
MCH: 29.5 pg (ref 26.0–34.0)
MCHC: 32.2 g/dL (ref 30.0–36.0)
MCV: 91.6 fL (ref 80.0–100.0)
Monocytes Absolute: 0.7 10*3/uL (ref 0.1–1.0)
Monocytes Relative: 5 %
Neutro Abs: 11.3 10*3/uL — ABNORMAL HIGH (ref 1.7–7.7)
Neutrophils Relative %: 83 %
Platelets: 188 10*3/uL (ref 150–400)
RBC: 4.17 MIL/uL (ref 3.87–5.11)
RDW: 13 % (ref 11.5–15.5)
WBC: 13.7 10*3/uL — ABNORMAL HIGH (ref 4.0–10.5)
nRBC: 0 % (ref 0.0–0.2)

## 2020-04-17 LAB — URINALYSIS, ROUTINE W REFLEX MICROSCOPIC
Bilirubin Urine: NEGATIVE
Glucose, UA: NEGATIVE mg/dL
Ketones, ur: NEGATIVE mg/dL
Leukocytes,Ua: NEGATIVE
Nitrite: NEGATIVE
Protein, ur: NEGATIVE mg/dL
Specific Gravity, Urine: 1.01 (ref 1.005–1.030)
pH: 6.5 (ref 5.0–8.0)

## 2020-04-17 LAB — LIPASE, BLOOD: Lipase: 52 U/L — ABNORMAL HIGH (ref 11–51)

## 2020-04-17 LAB — URINALYSIS, MICROSCOPIC (REFLEX)

## 2020-04-17 MED ORDER — CIPROFLOXACIN HCL 500 MG PO TABS
500.0000 mg | ORAL_TABLET | Freq: Once | ORAL | Status: AC
  Administered 2020-04-17: 500 mg via ORAL
  Filled 2020-04-17: qty 1

## 2020-04-17 MED ORDER — IOHEXOL 300 MG/ML  SOLN
100.0000 mL | Freq: Once | INTRAMUSCULAR | Status: AC | PRN
  Administered 2020-04-17: 100 mL via INTRAVENOUS

## 2020-04-17 MED ORDER — CIPROFLOXACIN HCL 500 MG PO TABS: 500.0000 mg | ORAL_TABLET | Freq: Two times a day (BID) | ORAL | 0 refills | Status: DC

## 2020-04-17 MED ORDER — METRONIDAZOLE 500 MG PO TABS: 500.0000 mg | ORAL_TABLET | Freq: Three times a day (TID) | ORAL | 0 refills | Status: DC

## 2020-04-17 MED ORDER — TRAMADOL HCL 50 MG PO TABS: 50.0000 mg | ORAL_TABLET | Freq: Four times a day (QID) | ORAL | 0 refills | Status: DC | PRN

## 2020-04-17 MED ORDER — METRONIDAZOLE 500 MG PO TABS
500.0000 mg | ORAL_TABLET | Freq: Once | ORAL | Status: AC
  Administered 2020-04-17: 500 mg via ORAL
  Filled 2020-04-17: qty 1

## 2020-04-17 MED ORDER — ONDANSETRON HCL 4 MG PO TABS: 4.0000 mg | ORAL_TABLET | Freq: Three times a day (TID) | ORAL | 0 refills | Status: DC | PRN

## 2020-04-17 NOTE — Patient Instructions (Signed)
Please proceed to the ER on the ground floor for further evaluation of your abdominal pain- you may have appendicitis as you mentioned and you likely do need a CT scan  I hope that you feel much better soon

## 2020-04-17 NOTE — ED Notes (Signed)
Pt states she here for CT scan, pt going back to MD office to get order instead of being seen in ed

## 2020-04-17 NOTE — ED Notes (Signed)
Pt states abdominal pain started on Christmas, states did not over eat that day or since, states has multiple surgerys, births and mesh implant. State comes and goes started as upper but goes down to lower abdomen.

## 2020-04-17 NOTE — Discharge Instructions (Addendum)
Your history and exam and work-up today are consistent and revealed acute diverticulitis without any complications such as perforation or abscess.  We will give you the prescription for antibiotics to be taken for the next 10 days.  Please take them.  Please use the pain medicine and nausea medicine to help with your symptoms and rest and stay hydrated.  If any symptoms change or worsen, please return to the nearest emergency department.

## 2020-04-17 NOTE — ED Provider Notes (Signed)
[facility] EMERGENCY DEPARTMENT Provider Note   CSN: [id number] Arrival date & time: 04/17/20  1406     History Chief Complaint  Patient presents with  . Abdominal Pain    Kara Lee is a 74 y.o. female.  The history is provided by the patient and medical records. No language interpreter was used.  Abdominal Pain Pain location:  LLQ, RLQ and suprapubic Pain quality: aching, cramping and sharp   Pain radiates to:  Does not radiate Pain severity:  Moderate Onset quality:  Gradual Duration:  4 days Timing:  Constant Progression:  Waxing and waning Chronicity:  New Context: not trauma   Relieved by:  Nothing Worsened by:  Nothing Ineffective treatments:  None tried Associated symptoms: nausea   Associated symptoms: no chest pain, no chills, no constipation, no cough, no diarrhea, no dysuria, no fatigue, no fever, no shortness of breath and no vomiting   Risk factors: obesity        Past Medical History:  Diagnosis Date  . Anxiety   . Complication of anesthesia   . Concussion November 09, 2012   From a fall at home on deck  . Depression    postpartum depression after 2nd child  . Diabetes mellitus (HCC)   . Fatty liver   . GERD (gastroesophageal reflux disease)   . HLD (hyperlipidemia)   . Hypertension   . Morbid obesity (HCC)   . OSA on CPAP    uses CPAP nightly  . Osteoarthritis of right knee 07/31/2013  . PONV (postoperative nausea and vomiting)   . Rosacea   . Severe aortic stenosis     Patient Active Problem List   Diagnosis Date Noted  . Essential hypertension 03/03/2020  . GAD (generalized anxiety disorder) 03/03/2020  . Depression, major, single episode, moderate (HCC) 02/11/2020  . Diabetes mellitus type 2 in obese (HCC) 10/24/2019  . Proteinuria 10/24/2019  . S/P TAVR (transcatheter aortic valve replacement) Transcatheter Aortic Valve Replacement - Percutaneous Transfemoral Approach Edwards Sapien 3 Ultra THV (size 26 mm ) 01/30/2019  01/30/2019  . Diabetes mellitus (HCC)   . GERD (gastroesophageal reflux disease)   . HLD (hyperlipidemia)   . OSA on CPAP 10/02/2017  . Morbid obesity (HCC) 10/02/2017  . Severe aortic stenosis   . Osteoarthritis of right knee 07/31/2013    Past Surgical History:  Procedure Laterality Date  . ABDOMINAL HYSTERECTOMY     still has uterus  . ANKLE RECONSTRUCTION Left   . ARTERY BIOPSY Left 01/21/2020   Procedure: BIOPSY TEMPORAL ARTERY;  Surgeon: Newman Pieseoh, Su, MD;  Location: [facility];  Service: ENT;  Laterality: Left;  . BIOPSY BREAST Bilateral   . 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 CESAREAN SECTION     x 2  . CHOLECYSTECTOMY    . COLONOSCOPY    . EYE SURGERY     lasik; X3 for crossed eyes; 1 eyelid surgery  . HERNIA REPAIR    . PARTIAL KNEE ARTHROPLASTY Right 07/31/2013   Procedure: RIGHT UNICOMPARTMENTAL KNEE;  Surgeon: Eulas PostJoshua P Landau, MD;  Location: [facility] OR;  Service: Orthopedics;  Laterality: Right;  . RIGHT/LEFT HEART CATH AND CORONARY ANGIOGRAPHY N/A 10/04/2017   Procedure: RIGHT/LEFT HEART CATH AND CORONARY ANGIOGRAPHY;  Surgeon: Yates DecampGanji, Jay, MD;  Location: [facility] INVASIVE CV LAB;  Service: Cardiovascular;  Laterality: N/A;  . TEE WITHOUT CARDIOVERSION N/A 08/31/2017   Procedure: TRANSESOPHAGEAL ECHOCARDIOGRAM (TEE);  Surgeon: Yates DecampGanji, Jay, MD;  [facility];  Service: Cardiovascular;  Laterality: N/A;  . TEE WITHOUT CARDIOVERSION N/A 01/30/2019   Procedure: TRANSESOPHAGEAL ECHOCARDIOGRAM (TEE);  Surgeon: Tonny Bollman, MD;  Location: [facility] INVASIVE CV LAB;  Service: Open Heart Surgery;  Laterality: N/A;  . TRANSCATHETER AORTIC VALVE REPLACEMENT, TRANSFEMORAL N/A 01/30/2019   Procedure: TRANSCATHETER AORTIC VALVE REPLACEMENT, TRANSFEMORAL;  Surgeon: Tonny Bollman, MD;  Location: [facility] INVASIVE CV LAB;  Service: Open Heart Surgery;  Laterality: N/A;  . TUBAL LIGATION       OB History   No obstetric history on file.     Family History  Problem  Relation Age of Onset  . Pancreatitis Mother   . COPD Mother   . Cancer - Lung Mother   . Diabetes type II Mother   . Breast cancer Maternal Grandmother   . Cataracts Maternal Grandfather     Social History   Tobacco Use  . Smoking status: Never Smoker  . Smokeless tobacco: Never Used  Vaping Use  . Vaping Use: Never used  Substance Use Topics  . Alcohol use: Not Currently  . Drug use: No    Home Medications Prior to Admission medications   Medication Sig Start Date End Date Taking? Authorizing Provider  methocarbamol (ROBAXIN) 500 MG tablet Take 500 mg by mouth 4 (four) times daily.   Yes [provider]  acetaminophen (TYLENOL) 650 MG CR tablet Take 1,300 mg by mouth every 8 (eight) hours as needed for pain.    [provider]  aspirin EC 81 MG tablet Take 81 mg by mouth at bedtime.     [provider]  atenolol (TENORMIN) 25 MG tablet TAKE 1 TABLET BY MOUTH TWICE DAILY 10/05/19   Yates Decamp, MD  azelastine (ASTELIN) 0.1 % nasal spray Place 2 sprays into both nostrils 2 (two) times daily. Use in each nostril as directed 03/26/20   Sharlene Dory, DO  clonazePAM (KLONOPIN) 0.5 MG tablet Take 0.5-1 tablets (0.25-0.5 mg total) by mouth 2 (two) times daily as needed for anxiety. Take 1/2 by mouth twice daily 04/08/20   Sharlene Dory, DO  diphenhydrAMINE (BENADRYL) 25 mg capsule Take 25 mg by mouth as needed.    [provider]  esomeprazole (NEXIUM) 40 MG capsule Take 40 mg by mouth daily. 08/20/19   [provider]  estradiol (ESTRACE) 0.1 MG/GM vaginal cream Place 1 Applicatorful vaginally as needed (irritation).  12/16/16   [provider]  FLUoxetine (PROZAC) 40 MG capsule Take 40 mg by mouth daily.    [provider]  fluticasone (FLONASE) 50 MCG/ACT nasal spray Place 1 spray into both nostrils daily as needed for allergies.  09/05/18   [provider]  hydrocortisone (ANUSOL-HC) 2.5 % rectal  cream Place 1 application rectally daily as needed for hemorrhoids.     [provider]  insulin glargine (LANTUS) 100 UNIT/ML injection Inject 10 Units into the skin daily. Sliding scale whil on prednisone    [provider]  losartan (COZAAR) 25 MG tablet TAKE 1 TABLET(25 MG) BY MOUTH DAILY 01/18/20   Wendling, Jilda Roche, DO  metFORMIN (GLUCOPHAGE-XR) 500 MG 24 hr tablet Take 1 tablet (500 mg total) by mouth 2 (two) times daily. 10/06/17   Yates Decamp, MD  Pitavastatin Calcium (LIVALO) 4 MG TABS Take 1 tablet (4 mg total) by mouth daily. 03/21/20   Yates Decamp, MD  predniSONE (DELTASONE) 10 MG tablet Take 3 tablets (30 mg total) by mouth daily with breakfast for 25 days, THEN 2 tablets (20  mg total) daily with breakfast for 30 days, THEN 1 tablet (10 mg total) daily with breakfast. 01/28/20 04/22/20  Shelda Pal, DO  promethazine-dextromethorphan (PROMETHAZINE-DM) 6.25-15 MG/5ML syrup Take 5 mLs by mouth 4 (four) times daily as needed. 03/26/20   Shelda Pal, DO  Propylene Glycol 0.6 % SOLN Place 1 drop into both eyes daily as needed (dry eyes).    [provider]  sitaGLIPtin (JANUVIA) 100 MG tablet Take 100 mg by mouth daily.    [provider]    Allergies    Bee venom, Latex, Lovenox [enoxaparin sodium], Minocycline, Morphine and related, Nickel, Shellfish allergy, Statins, Sulfa antibiotics, Tape, and Glimepiride  Review of Systems   Review of Systems  Constitutional: Negative for chills, fatigue and fever.  HENT: Negative for congestion.   Eyes: Negative for visual disturbance.  Respiratory: Negative for cough, chest tightness, shortness of breath and wheezing.   Cardiovascular: Negative for chest pain, palpitations and leg swelling.  Gastrointestinal: Positive for abdominal pain and nausea. Negative for constipation, diarrhea and vomiting.  Genitourinary: Negative for dysuria, flank pain and frequency.  Musculoskeletal: Negative  for back pain, neck pain and neck stiffness.  Neurological: Negative for light-headedness and headaches.  Psychiatric/Behavioral: Negative for agitation.  All other systems reviewed and are negative.   Physical Exam Updated Vital Signs BP (!) 110/58 (BP Location: Left Arm)   Pulse 74   Temp 98.6 F (37 C) (Oral)   Resp 18   Ht 5\' 2"  (1.575 m)   Wt 108 kg   SpO2 99%   BMI 43.53 kg/m   Physical Exam Vitals and nursing note reviewed.  Constitutional:      General: She is not in acute distress.    Appearance: She is well-developed and well-nourished. She is not ill-appearing, toxic-appearing or diaphoretic.  HENT:     Head: Normocephalic and atraumatic.     Right Ear: External ear normal.     Left Ear: External ear normal.     Nose: Nose normal.     Mouth/Throat:     Mouth: Oropharynx is clear and moist. Mucous membranes are moist.     Pharynx: No oropharyngeal exudate.  Eyes:     Extraocular Movements: EOM normal.     Conjunctiva/sclera: Conjunctivae normal.     Pupils: Pupils are equal, round, and reactive to light.  Cardiovascular:     Rate and Rhythm: Normal rate.  Pulmonary:     Effort: No respiratory distress.     Breath sounds: No stridor.  Abdominal:     General: Abdomen is flat. Bowel sounds are normal. There is no distension.     Tenderness: There is abdominal tenderness in the right lower quadrant, suprapubic area and left lower quadrant. There is no right CVA tenderness, left CVA tenderness, guarding or rebound.    Musculoskeletal:     Cervical back: Normal range of motion and neck supple.  Skin:    General: Skin is warm.     Capillary Refill: Capillary refill takes less than 2 seconds.     Findings: No erythema or rash.  Neurological:     General: No focal deficit present.     Mental Status: She is alert and oriented to person, place, and time.     Motor: No abnormal muscle tone.     Coordination: Coordination normal.     Deep Tendon Reflexes:  Reflexes are normal and symmetric.  Psychiatric:        Mood and Affect: Mood  normal.     ED Results / Procedures / Treatments   Labs (all labs ordered are listed, but only abnormal results are displayed) Labs Reviewed  CBC WITH DIFFERENTIAL/PLATELET - Abnormal; Notable for the following components:      Result Value   WBC 13.7 (*)    Neutro Abs 11.3 (*)    All other components within normal limits  COMPREHENSIVE METABOLIC PANEL - Abnormal; Notable for the following components:   Sodium 130 (*)    CO2 21 (*)    Glucose, Bld 255 (*)    All other components within normal limits  LIPASE, BLOOD - Abnormal; Notable for the following components:   Lipase 52 (*)    All other components within normal limits  URINALYSIS, ROUTINE W REFLEX MICROSCOPIC - Abnormal; Notable for the following components:   Hgb urine dipstick TRACE (*)    All other components within normal limits  URINALYSIS, MICROSCOPIC (REFLEX) - Abnormal; Notable for the following components:   Bacteria, UA RARE (*)    All other components within normal limits    EKG None  Radiology CT ABDOMEN PELVIS W CONTRAST  Result Date: 04/17/2020 CLINICAL DATA:  Lower abdominal pain.  Appendicitis suspected. EXAM: CT ABDOMEN AND PELVIS WITH CONTRAST TECHNIQUE: Multidetector CT imaging of the abdomen and pelvis was performed using the standard protocol following bolus administration of intravenous contrast. CONTRAST:  OMNIPAQUE IOHEXOL 300 MG/ML  SOLN COMPARISON:  CT angiography 10/17/2018, abdominopelvic CT 10/11/2018 FINDINGS: Lower chest: Prior TAVR. Mitral annulus calcifications. Upper normal heart size. Occasional basilar atelectasis without confluent consolidation or pleural effusion. Hepatobiliary: Prominent size liver spanning 17.9 cm cranial caudal. Borderline hepatic steatosis. No focal hepatic abnormality. Post cholecystectomy. Common bile duct measures 9 mm, not enlarged for post cholecystectomy state, however there is  a 5 mm stone in the mid distal common bile duct, series 2, image 28, series 5, image 45. Pancreas: No ductal dilatation or inflammation. Occasional calcifications in the pancreatic head and uncinate process. Spleen: Normal in size without focal abnormality. Adrenals/Urinary Tract: Normal adrenal glands. No hydronephrosis or perinephric edema. Homogeneous renal enhancement with symmetric excretion on delayed phase imaging. Simple 2.6 cm cyst in the medial right mid kidney with additional tiny cortical hypodensities are too small to accurately characterize. Urinary bladder is physiologically distended without wall thickening. Stomach/Bowel: There is no inflamed diverticulum at the junction of the descending and sigmoid colon, series 2, image 60, with adjacent fat stranding and pericolonic edema. No perforation, extraluminal air or focal fluid collection. Additional scattered noninflamed colonic diverticula in the sigmoid colon. Moderate volume of colonic stool. Normal diminutive appendix, series 5, image 44, better visualized on prior exam. No appendicitis. Decompressed small bowel without obstruction or wall thickening. The stomach is nondistended. Normal positioning of the duodenum and ligament of Treitz. Vascular/Lymphatic: Mild aortic atherosclerosis. Duplicated left IVC. Patent portal vein. No enlarged lymph nodes in the abdomen or pelvis. Reproductive: The uterus is present and unremarkable. Neither ovary is visualized. No adnexal mass. Other: No free air, ascites or focal fluid collection. Tiny fat containing umbilical hernia. A small bowel loop approaches the umbilical hernia but does not enter. Musculoskeletal: Degenerative disc disease and facet hypertrophy most prominent in the lower lumbar spine. There are no acute or suspicious osseous abnormalities. IMPRESSION: 1. Uncomplicated acute diverticulitis at the junction of the descending and sigmoid colon. No perforation or abscess. 2. Post cholecystectomy.  Choledocholithiasis with 5 mm stone in the mid distal common bile duct, however no biliary ductal  dilatation. Aortic Atherosclerosis (ICD10-I70.0). Electronically Signed   By: Keith Rake M.D.   On: 04/17/2020 16:39    Procedures Procedures (including critical care time)  Medications Ordered in ED Medications  ciprofloxacin (CIPRO) tablet 500 mg (has no administration in time range)  metroNIDAZOLE (FLAGYL) tablet 500 mg (has no administration in time range)  iohexol (OMNIPAQUE) 300 MG/ML solution 100 mL (100 mLs Intravenous Contrast Given 04/17/20 1608)    ED Course  I have reviewed the triage vital signs and the nursing notes.  Pertinent labs & imaging results that were available during my care of the patient were reviewed by me and considered in my medical decision making (see chart for details).    MDM Rules/Calculators/A&P                          Kara Lee is a 73 y.o. female with a past medical history significant for diabetes, GERD, hyperlipidemia, hypertension, anxiety, aortic stenosis status post TAVR, and fatty liver who presents with abdominal pain.  Patient reports that for the last few days, she has had pain in her lower abdomen.  She saw her PCP and was evaluated and sent here for evaluation and rule out of appendicitis.  Patient reports the pain is moderate going across her lower abdomen and occasionally has some nausea.  She reports no constipation or diarrhea but does report she is had some looser stools occasionally.  It was nonbloody.  She denies any fevers or chills.  Denies any chest pain, shortness of breath, palpitations, cough, or URI symptoms.  She denies other complaints.  On exam, abdomen is tender in the lower abdomen.  Bowel sounds were appreciated.  Lungs were clear and chest was nontender.  Patient is otherwise well-appearing.  No CVA tenderness or flank tenderness.  Patient had work-up including labs and a CT scan in the emergency department.   Patient was found to have acute diverticulitis with no evidence of perforation or abscess.  She also was found to have a common bile duct stone however, patient has already had her gallbladder removed and she is not having upper abdominal pain.  Her LFTs were not elevated and her lipase was 52 and not significantly elevated.  I do not suspect this is causing her discomfort and I suspect the acute diverticulitis is the cause of her lower abdominal pain however, we did encourage her to follow-up with her PCP for further monitoring of the stone in the biliary tract.  Patient will be given antibiotics here in the emergency department and will be given first for antibiotics, pain medicine, nausea medicine.  She will follow-up with her PCP and understood return precautions and follow-up instructions.  Patient had no other questions or concerns and was discharged in good condition.   Final Clinical Impression(s) / ED Diagnoses Final diagnoses:  Diverticulitis  Lower abdominal pain    Rx / DC Orders ED Discharge Orders         Ordered    ciprofloxacin (CIPRO) 500 MG tablet  Every 12 hours        04/17/20 2025    metroNIDAZOLE (FLAGYL) 500 MG tablet  3 times daily        04/17/20 2025    traMADol (ULTRAM) 50 MG tablet  Every 6 hours PRN        04/17/20 2025    ondansetron (ZOFRAN) 4 MG tablet  Every 8 hours PRN  04/17/20 2025          Clinical Impression: 1. Diverticulitis   2. Lower abdominal pain     Disposition: Discharge  Condition: Good  I have discussed the results, Dx and Tx plan with the pt(& family if present). He/she/they expressed understanding and agree(s) with the plan. Discharge instructions discussed at great length. Strict return precautions discussed and pt &/or family have verbalized understanding of the instructions. No further questions at time of discharge.    New Prescriptions   CIPROFLOXACIN (CIPRO) 500 MG TABLET    Take 1 tablet (500 mg total) by mouth  every 12 (twelve) hours for 10 days.   METRONIDAZOLE (FLAGYL) 500 MG TABLET    Take 1 tablet (500 mg total) by mouth 3 (three) times daily for 10 days.   ONDANSETRON (ZOFRAN) 4 MG TABLET    Take 1 tablet (4 mg total) by mouth every 8 (eight) hours as needed for nausea or vomiting.   TRAMADOL (ULTRAM) 50 MG TABLET    Take 1 tablet (50 mg total) by mouth every 6 (six) hours as needed.    Follow Up: Shelda Pal, DO [address] [place] [address] [phone]     [place] EMERGENCY DEPARTMENT [address] [id number] [place] [phone]       Tyreka Henneke, Gwenyth Allegra, MD 04/17/20 2033

## 2020-04-17 NOTE — ED Triage Notes (Signed)
Sent here by PMD for eval for appendicitis

## 2020-04-19 ENCOUNTER — Encounter (HOSPITAL_BASED_OUTPATIENT_CLINIC_OR_DEPARTMENT_OTHER): Payer: Self-pay | Admitting: Emergency Medicine

## 2020-04-19 ENCOUNTER — Emergency Department (HOSPITAL_BASED_OUTPATIENT_CLINIC_OR_DEPARTMENT_OTHER)
Admission: EM | Admit: 2020-04-19 | Discharge: 2020-04-19 | Disposition: A | Discharge status: Home / Self Care | Payer: Medicare Other | Attending: Emergency Medicine | Admitting: Emergency Medicine

## 2020-04-19 DIAGNOSIS — Z7984 Long term (current) use of oral hypoglycemic drugs: Secondary | ICD-10-CM | POA: Insufficient documentation

## 2020-04-19 DIAGNOSIS — R220 Localized swelling, mass and lump, head: Secondary | ICD-10-CM | POA: Insufficient documentation

## 2020-04-19 DIAGNOSIS — E119 Type 2 diabetes mellitus without complications: Secondary | ICD-10-CM | POA: Insufficient documentation

## 2020-04-19 DIAGNOSIS — T368X5A Adverse effect of other systemic antibiotics, initial encounter: Secondary | ICD-10-CM | POA: Insufficient documentation

## 2020-04-19 DIAGNOSIS — Z954 Presence of other heart-valve replacement: Secondary | ICD-10-CM | POA: Diagnosis not present

## 2020-04-19 DIAGNOSIS — Z96651 Presence of right artificial knee joint: Secondary | ICD-10-CM | POA: Insufficient documentation

## 2020-04-19 DIAGNOSIS — I10 Essential (primary) hypertension: Secondary | ICD-10-CM | POA: Diagnosis not present

## 2020-04-19 DIAGNOSIS — Z794 Long term (current) use of insulin: Secondary | ICD-10-CM | POA: Diagnosis not present

## 2020-04-19 DIAGNOSIS — Z91040 Latex allergy status: Secondary | ICD-10-CM | POA: Diagnosis not present

## 2020-04-19 DIAGNOSIS — T7840XA Allergy, unspecified, initial encounter: Secondary | ICD-10-CM

## 2020-04-19 DIAGNOSIS — Z7982 Long term (current) use of aspirin: Secondary | ICD-10-CM | POA: Insufficient documentation

## 2020-04-19 MED ORDER — AMOXICILLIN-POT CLAVULANATE 875-125 MG PO TABS: 1.0000 | ORAL_TABLET | Freq: Two times a day (BID) | ORAL | 0 refills | Status: DC

## 2020-04-19 NOTE — Discharge Instructions (Signed)
Benadryl or Zyrtec may help if there is more swelling.  Watch for worsening abdominal pain.  Watch for any trouble breathing or swelling in your mouth.  Follow-up with Dr. Carmelia Roller as needed.

## 2020-04-19 NOTE — ED Triage Notes (Signed)
Pt diagnosed with diverticulitis. Has been taking prescribed abx and woke up yesterday with facial swelling. Denies LOC.

## 2020-04-19 NOTE — ED Provider Notes (Signed)
[facility] EMERGENCY DEPARTMENT Provider Note   CSN: [id number] Arrival date & time: 04/19/20  1109     History Chief Complaint  Patient presents with  . Allergic Reaction    Kara Lee is a 75 y.o. female.  HPI Patient presents with facial swelling and some redness.  Began earlier today.  Recently started on Cipro and Flagyl for diverticulitis.  States her abdomen is feeling fine.  States she may have had Cipro years ago but does not think she is ever had Flagyl.  Had a weight of around 5-1/2 hours before she could be seen by me in the ER.  States in that time the swelling is improved.  States she is on a low-dose of steroids at home that is being decreased.  No chest pain no trouble breathing.  No swelling of her arms.  Has some seasonal allergies but otherwise has not really had allergies.    Past Medical History:  Diagnosis Date  . Anxiety   . Complication of anesthesia   . Concussion November 09, 2012   From a fall at home on deck  . Depression    postpartum depression after 2nd child  . Diabetes mellitus (HCC)   . Fatty liver   . GERD (gastroesophageal reflux disease)   . HLD (hyperlipidemia)   . Hypertension   . Morbid obesity (HCC)   . OSA on CPAP    uses CPAP nightly  . Osteoarthritis of right knee 07/31/2013  . PONV (postoperative nausea and vomiting)   . Rosacea   . Severe aortic stenosis     Patient Active Problem List   Diagnosis Date Noted  . Essential hypertension 03/03/2020  . GAD (generalized anxiety disorder) 03/03/2020  . Depression, major, single episode, moderate (HCC) 02/11/2020  . Diabetes mellitus type 2 in obese (HCC) 10/24/2019  . Proteinuria 10/24/2019  . S/P TAVR (transcatheter aortic valve replacement) Transcatheter Aortic Valve Replacement - Percutaneous Transfemoral Approach Edwards Sapien 3 Ultra THV (size 26 mm ) 01/30/2019 01/30/2019  . Diabetes mellitus (HCC)   . GERD (gastroesophageal reflux disease)   . HLD  (hyperlipidemia)   . OSA on CPAP 10/02/2017  . Morbid obesity (HCC) 10/02/2017  . Severe aortic stenosis   . Osteoarthritis of right knee 07/31/2013    Past Surgical History:  Procedure Laterality Date  . ABDOMINAL HYSTERECTOMY     still has uterus  . ANKLE RECONSTRUCTION Left   . ARTERY BIOPSY Left 01/21/2020   Procedure: BIOPSY TEMPORAL ARTERY;  Surgeon: Newman Pies, MD;  Location: [facility];  Service: ENT;  Laterality: Left;  . BIOPSY BREAST Bilateral   . 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 CESAREAN SECTION     x 2  . CHOLECYSTECTOMY    . COLONOSCOPY    . EYE SURGERY     lasik; X3 for crossed eyes; 1 eyelid surgery  . HERNIA REPAIR    . PARTIAL KNEE ARTHROPLASTY Right 07/31/2013   Procedure: RIGHT UNICOMPARTMENTAL KNEE;  Surgeon: Eulas Post, MD;  Location: [facility] OR;  Service: Orthopedics;  Laterality: Right;  . RIGHT/LEFT HEART CATH AND CORONARY ANGIOGRAPHY N/A 10/04/2017   Procedure: RIGHT/LEFT HEART CATH AND CORONARY ANGIOGRAPHY;  Surgeon: Yates Decamp, MD;  Location: [facility] INVASIVE CV LAB;  Service: Cardiovascular;  Laterality: N/A;  . TEE WITHOUT CARDIOVERSION N/A 08/31/2017   Procedure: TRANSESOPHAGEAL ECHOCARDIOGRAM (TEE);  Surgeon: Yates Decamp, MD;  Location: [facility] ENDOSCOPY;  Service: Cardiovascular;  Laterality: N/A;  .  TEE WITHOUT CARDIOVERSION N/A 01/30/2019   Procedure: TRANSESOPHAGEAL ECHOCARDIOGRAM (TEE);  Surgeon: Tonny Bollman, MD;  Location: [facility] INVASIVE CV LAB;  Service: Open Heart Surgery;  Laterality: N/A;  . TRANSCATHETER AORTIC VALVE REPLACEMENT, TRANSFEMORAL N/A 01/30/2019   Procedure: TRANSCATHETER AORTIC VALVE REPLACEMENT, TRANSFEMORAL;  Surgeon: Tonny Bollman, MD;  Location: [facility] INVASIVE CV LAB;  Service: Open Heart Surgery;  Laterality: N/A;  . TUBAL LIGATION       OB History   No obstetric history on file.     Family History  Problem Relation Age of Onset  . Pancreatitis Mother   . COPD Mother   . Cancer - Lung Mother   .  Diabetes type II Mother   . Breast cancer Maternal Grandmother   . Cataracts Maternal Grandfather     Social History   Tobacco Use  . Smoking status: Never Smoker  . Smokeless tobacco: Never Used  Vaping Use  . Vaping Use: Never used  Substance Use Topics  . Alcohol use: Not Currently  . Drug use: No    Home Medications Prior to Admission medications   Medication Sig Start Date End Date Taking? Authorizing Provider  amoxicillin-clavulanate (AUGMENTIN) 875-125 MG tablet Take 1 tablet by mouth every 12 (twelve) hours. 04/19/20  Yes Benjiman Core, MD  acetaminophen (TYLENOL) 650 MG CR tablet Take 1,300 mg by mouth every 8 (eight) hours as needed for pain.    [provider]  aspirin EC 81 MG tablet Take 81 mg by mouth at bedtime.     [provider]  atenolol (TENORMIN) 25 MG tablet TAKE 1 TABLET BY MOUTH TWICE DAILY 10/05/19   Yates Decamp, MD  azelastine (ASTELIN) 0.1 % nasal spray Place 2 sprays into both nostrils 2 (two) times daily. Use in each nostril as directed 03/26/20   Sharlene Dory, DO  clonazePAM (KLONOPIN) 0.5 MG tablet Take 0.5-1 tablets (0.25-0.5 mg total) by mouth 2 (two) times daily as needed for anxiety. Take 1/2 by mouth twice daily 04/08/20   Sharlene Dory, DO  diphenhydrAMINE (BENADRYL) 25 mg capsule Take 25 mg by mouth as needed.    [provider]  esomeprazole (NEXIUM) 40 MG capsule Take 40 mg by mouth daily. 08/20/19   [provider]  estradiol (ESTRACE) 0.1 MG/GM vaginal cream Place 1 Applicatorful vaginally as needed (irritation).  12/16/16   [provider]  FLUoxetine (PROZAC) 40 MG capsule Take 40 mg by mouth daily.    [provider]  fluticasone (FLONASE) 50 MCG/ACT nasal spray Place 1 spray into both nostrils daily as needed for allergies.  09/05/18   [provider]  hydrocortisone (ANUSOL-HC) 2.5 % rectal cream Place 1 application rectally daily as needed for hemorrhoids.      [provider]  insulin glargine (LANTUS) 100 UNIT/ML injection Inject 10 Units into the skin daily. Sliding scale whil on prednisone    [provider]  losartan (COZAAR) 25 MG tablet TAKE 1 TABLET(25 MG) BY MOUTH DAILY 01/18/20   Wendling, Jilda Roche, DO  metFORMIN (GLUCOPHAGE-XR) 500 MG 24 hr tablet Take 1 tablet (500 mg total) by mouth 2 (two) times daily. 10/06/17   Yates Decamp, MD  methocarbamol (ROBAXIN) 500 MG tablet Take 500 mg by mouth 4 (four) times daily.    [provider]  ondansetron (ZOFRAN) 4 MG tablet Take 1 tablet (4 mg total) by mouth every 8 (eight) hours as needed for nausea or vomiting. 04/17/20   Tegeler, Canary Brim, MD  Pitavastatin Calcium (LIVALO) 4 MG TABS Take 1 tablet (4 mg total) by mouth daily. 03/21/20   Adrian Prows, MD  predniSONE (DELTASONE) 10 MG tablet Take 3 tablets (30 mg total) by mouth daily with breakfast for 25 days, THEN 2 tablets (20 mg total) daily with breakfast for 30 days, THEN 1 tablet (10 mg total) daily with breakfast. 01/28/20 04/22/20  Shelda Pal, DO  promethazine-dextromethorphan (PROMETHAZINE-DM) 6.25-15 MG/5ML syrup Take 5 mLs by mouth 4 (four) times daily as needed. 03/26/20   Shelda Pal, DO  Propylene Glycol 0.6 % SOLN Place 1 drop into both eyes daily as needed (dry eyes).    [provider]  sitaGLIPtin (JANUVIA) 100 MG tablet Take 100 mg by mouth daily.    [provider]  traMADol (ULTRAM) 50 MG tablet Take 1 tablet (50 mg total) by mouth every 6 (six) hours as needed. 04/17/20   Tegeler, Gwenyth Allegra, MD    Allergies    Bee venom, Latex, Lovenox [enoxaparin sodium], Minocycline, Morphine and related, Nickel, Shellfish allergy, Statins, Sulfa antibiotics, Tape, and Glimepiride  Review of Systems   Review of Systems  Constitutional: Negative for appetite change.  HENT: Negative for congestion.   Respiratory: Negative for shortness of breath.   Cardiovascular:  Negative for chest pain.  Gastrointestinal: Positive for abdominal pain.  Genitourinary: Negative for flank pain.  Musculoskeletal: Negative for back pain.  Neurological: Negative for weakness.    Physical Exam Updated Vital Signs BP 112/66 (BP Location: Left Arm)   Pulse 70   Temp 98.3 F (36.8 C) (Oral)   Resp 18   Ht 5\' 2"  (1.575 m)   Wt 108 kg   SpO2 96%   BMI 43.53 kg/m   Physical Exam Vitals and nursing note reviewed.  HENT:     Head:     Comments: Mild erythema and swelling of lower face.  No stridor.  Per patient this is decreased from earlier today.  No induration.    Mouth/Throat:     Pharynx: No oropharyngeal exudate or posterior oropharyngeal erythema.     Comments: No intraoral edema. Eyes:     General: No scleral icterus.    Pupils: Pupils are equal, round, and reactive to light.  Cardiovascular:     Rate and Rhythm: Normal rate.  Pulmonary:     Breath sounds: Normal breath sounds. No wheezing or rhonchi.  Musculoskeletal:     Cervical back: Neck supple.  Neurological:     Mental Status: She is alert.     ED Results / Procedures / Treatments   Labs (all labs ordered are listed, but only abnormal results are displayed) Labs Reviewed - No data to display  EKG None  Radiology No results found.  Procedures Procedures (including critical care time)  Medications Ordered in ED Medications - No data to display  ED Course  I have reviewed the triage vital signs and the nursing notes.  Pertinent labs & imaging results that were available during my care of the patient were reviewed by me and considered in my medical decision making (see chart for details).    MDM Rules/Calculators/A&P                          Patient with mild swelling of face.  No other edema.  No posterior pharyngeal involvement.  Potentially could be reaction to the Cipro or Flagyl.  Has not taken either the Zofran or the tramadol.  We will stop both Cipro and Flagyl and  switch to Augmentin.  Will return for worsening symptoms.  Is already on a steroid taper.  Benadryl as needed.  Discharge home. Final Clinical Impression(s) / ED Diagnoses Final diagnoses:  Allergic reaction, initial encounter    Rx / DC Orders ED Discharge Orders         Ordered    amoxicillin-clavulanate (AUGMENTIN) 875-125 MG tablet  Every 12 hours        04/19/20 1653           Davonna Belling, MD 04/19/20 1720

## 2020-04-19 NOTE — ED Notes (Signed)
ED Provider at bedside. 

## 2020-04-21 ENCOUNTER — Telehealth: Payer: Self-pay

## 2020-04-21 NOTE — Telephone Encounter (Signed)
Nurse Assessment Nurse: Michell Heinrich, RN, Lanora Manis Date/Time (Eastern Time): 04/19/2020 9:59:39 AM Confirm and document reason for call. If symptomatic, describe symptoms. ---Caller states that she was recently started on antibiotics for diverticulitis. States that she is having facial swelling and red. States that her lips feel funny. Does the patient have any new or worsening symptoms? ---Yes Will a triage be completed? ---Yes Related visit to physician within the last 2 weeks? ---Yes Does the PT have any chronic conditions? (i.e. diabetes, asthma, this includes High risk factors for pregnancy, etc.) ---Yes List chronic conditions. ---diabetic heart valve replacement Is this a behavioral health or substance abuse call? ---No Guidelines Guideline Title Affirmed Question Affirmed Notes Nurse Date/Time Lamount Cohen Time) Face Swelling Patient sounds very sick or weak to the triager Michell Heinrich, RN, Lanora Manis 04/19/2020 10:01:55 AM Disp. Time Lamount Cohen Time) Disposition Final User 04/19/2020 10:09:55 AM Go to ED Now Yes Cantrell, RN, Lanora Manis Disposition Overriden: Go to ED Now (or PCP triage) Override Reason: Specify reason. (Please document in 'advice recommended' section) Caller Disagree/Comply Comply PLEASE NOTE: All timestamps contained within this report are represented as [place] Standard Time. CONFIDENTIALTY NOTICE: This fax transmission is intended only for the addressee. It contains information that is legally privileged, confidential or otherwise protected from use or disclosure. If you are not the intended recipient, you are strictly prohibited from reviewing, disclosing, copying using or disseminating any of this information or taking any action in reliance on or regarding this information. If you have received this fax in error, please notify us immediately by telephone so that we can arrange for its return to [place]. Phone: [phone], Toll-Free: [phone], Fax: [phone] Page: 2 of  2 Call Id: [id number] Caller Understands Yes PreDisposition InappropriateToAsk Care Advice Given Per Guideline GO TO ED NOW: * You need to be seen in the Emergency Department. * Leave now. Drive carefully. CALL EMS 911 IF: * Any difficulty breathing or swallowing * Any tongue swelling or swelling inside your mouth. CARE ADVICE given per Face Swelling (Adult) guideline. Comments User: Patrica Duel, RN Date/Time Lamount Cohen Time): 04/19/2020 10:09:48 AM Caller states that her voice is hoarse. Recently started on antibiotics. Upgraded. Advised caller if she got to weak to stand or felt like passing out to call 911. Verbalizes understanding. User: Patrica Duel, RN Date/Time Lamount Cohen Time): 04/19/2020 10:10:58 AM Advised caller not to take antibiotics until seen. Verbalizes understanding. Referrals GO TO FACILITY UNDECIDED  Pt seen in ED.

## 2020-04-22 NOTE — Addendum Note (Signed)
Addended by: Rosita Kea on: 04/22/2020 08:23 AM   Modules accepted: Orders

## 2020-05-12 ENCOUNTER — Encounter: Payer: Self-pay | Admitting: Family Medicine

## 2020-05-12 ENCOUNTER — Ambulatory Visit: Payer: Medicare Other | Admitting: Family Medicine

## 2020-05-12 ENCOUNTER — Other Ambulatory Visit: Payer: Self-pay

## 2020-05-12 ENCOUNTER — Telehealth: Payer: Self-pay

## 2020-05-12 VITALS — BP 118/68 | HR 82 | Temp 98.0°F | Ht 62.0 in | Wt 235.2 lb

## 2020-05-12 DIAGNOSIS — M26629 Arthralgia of temporomandibular joint, unspecified side: Secondary | ICD-10-CM | POA: Diagnosis not present

## 2020-05-12 DIAGNOSIS — M19042 Primary osteoarthritis, left hand: Secondary | ICD-10-CM | POA: Diagnosis not present

## 2020-05-12 DIAGNOSIS — S46812A Strain of other muscles, fascia and tendons at shoulder and upper arm level, left arm, initial encounter: Secondary | ICD-10-CM

## 2020-05-12 DIAGNOSIS — M19041 Primary osteoarthritis, right hand: Secondary | ICD-10-CM | POA: Diagnosis not present

## 2020-05-12 MED ORDER — MELOXICAM 7.5 MG PO TABS
7.5000 mg | ORAL_TABLET | Freq: Every day | ORAL | 0 refills | Status: DC
Start: 2020-05-12 — End: 2020-08-19

## 2020-05-12 MED ORDER — CYCLOBENZAPRINE HCL 10 MG PO TABS: 5.0000 mg | ORAL_TABLET | Freq: Three times a day (TID) | ORAL | 0 refills | Status: DC | PRN

## 2020-05-12 NOTE — Progress Notes (Signed)
CC: Hand pains  Subjective: Patient is a 75 y.o. female here for arthritis.  Started having b/l hand pain, jaw pain, shoulder/neck pain over the past 1 week. No inj or change in activity. Denies any redness, swelling or bruising. Tried muscle relaxer, Tylenol, oxycontin from prior surgery. Nothing helped. No neuro s/s's.   Past Medical History:  Diagnosis Date  . Anxiety   . Complication of anesthesia   . Concussion November 09, 2012   From a fall at home on deck  . Depression    postpartum depression after 2nd child  . Diabetes mellitus ([place])   . Fatty liver   . GERD (gastroesophageal reflux disease)   . HLD (hyperlipidemia)   . Hypertension   . Morbid obesity ([place])   . OSA on CPAP    uses CPAP nightly  . Osteoarthritis of right knee 07/31/2013  . PONV (postoperative nausea and vomiting)   . Rosacea   . Severe aortic stenosis     Objective: BP 118/68 (BP Location: Left Arm, Patient Position: Sitting, Cuff Size: Large)   Pulse 82   Temp 98 F (36.7 C) (Oral)   Ht 5\' 2"  (1.575 m)   Wt 235 lb 4 oz (106.7 kg)   SpO2 97%   BMI 43.03 kg/m  General: Awake, appears stated age MSK: +TTP over L TMJ and L trap; normal ROM on L shoulder, no ttp over shoulder; no ttp over MTP, PIP, DIP jts b/l, no effusion or erythema, nml ROM of fingers b/l No ttp over temporalis b/l.  Lungs: No accessory muscle use Psych: Age appropriate judgment and insight, normal affect and mood  Assessment and Plan: Osteoarthritis of both hands, unspecified osteoarthritis type - Plan: meloxicam (MOBIC) 7.5 MG tablet  TMJ syndrome - Plan: meloxicam (MOBIC) 7.5 MG tablet, cyclobenzaprine (FLEXERIL) 10 MG tablet  Strain of left trapezius muscle, initial encounter - Plan: meloxicam (MOBIC) 7.5 MG tablet, cyclobenzaprine (FLEXERIL) 10 MG tablet  Start low dose Mobic and Flexeril. She does well w Flexeril without drowsiness.  Warnings about it verbalized and written down. Heat, ice, Tylenol, stretches/exercises.  Low risk for GCA again.  The patient voiced understanding and agreement to the plan.  [place], DO 05/12/20  1:41 PM

## 2020-05-12 NOTE — Patient Instructions (Signed)
OK to take Tylenol 1000 mg (2 extra strength tabs) or 975 mg (3 regular strength tabs) every 6 hours as needed.  Ice/cold pack over area for 10-15 min twice daily.  Heat (pad or rice pillow in microwave) over affected area, 10-15 minutes twice daily.   Take Flexeril (cyclobenzaprine) 1-2 hours before planned bedtime. If it makes you drowsy, do not take during the day. You can try half a tab the following night.  Let us know if you need anything.  Trapezius stretches/exercises Do exercises exactly as told by your health care provider and adjust them as directed. It is normal to feel mild stretching, pulling, tightness, or discomfort as you do these exercises, but you should stop right away if you feel sudden pain or your pain gets worse.  Stretching and range of motion exercises These exercises warm up your muscles and joints and improve the movement and flexibility of your shoulder. These exercises can also help to relieve pain, numbness, and tingling. If you are unable to do any of the following for any reason, do not further attempt to do it.   Exercise A: Flexion, standing    1. Stand and hold a broomstick, a cane, or a similar object. Place your hands a little more than shoulder-width apart on the object. Your left / right hand should be palm-up, and your other hand should be palm-down. 2. Push the stick to raise your left / right arm out to your side and then over your head. Use your other hand to help move the stick. Stop when you feel a stretch in your shoulder, or when you reach the angle that is recommended by your health care provider. ? Avoid shrugging your shoulder while you raise your arm. Keep your shoulder blade tucked down toward your spine. 3. Hold for 30 seconds. 4. Slowly return to the starting position. Repeat 2 times. Complete this exercise 3 times per week.  Exercise B: Abduction, supine    1. Lie on your back and hold a broomstick, a cane, or a similar object.  Place your hands a little more than shoulder-width apart on the object. Your left / right hand should be palm-up, and your other hand should be palm-down. 2. Push the stick to raise your left / right arm out to your side and then over your head. Use your other hand to help move the stick. Stop when you feel a stretch in your shoulder, or when you reach the angle that is recommended by your health care provider. ? Avoid shrugging your shoulder while you raise your arm. Keep your shoulder blade tucked down toward your spine. 3. Hold for 30 seconds. 4. Slowly return to the starting position. Repeat 2 times. Complete this exercise 3 times per week.  Exercise C: Flexion, active-assisted    1. Lie on your back. You may bend your knees for comfort. 2. Hold a broomstick, a cane, or a similar object. Place your hands about shoulder-width apart on the object. Your palms should face toward your feet. 3. Raise the stick and move your arms over your head and behind your head, toward the floor. Use your healthy arm to help your left / right arm move farther. Stop when you feel a gentle stretch in your shoulder, or when you reach the angle where your health care provider tells you to stop. 4. Hold for 30 seconds. 5. Slowly return to the starting position. Repeat 2 times. Complete this exercise 3 times per week.  Exercise D: External rotation and abduction    1. Stand in a door frame with one of your feet slightly in front of the other. This is called a staggered stance. 2. Choose one of the following positions as told by your health care provider: ? Place your hands and forearms on the door frame above your head. ? Place your hands and forearms on the door frame at the height of your head. ? Place your hands on the door frame at the height of your elbows. 3. Slowly move your weight onto your front foot until you feel a stretch across your chest and in the front of your shoulders. Keep your head and chest  upright and keep your abdominal muscles tight. 4. Hold for 30 seconds. 5. To release the stretch, shift your weight to your back foot. Repeat 2 times. Complete this stretch 3 times per week.  Strengthening exercises These exercises build strength and endurance in your shoulder. Endurance is the ability to use your muscles for a long time, even after your muscles get tired. Exercise E: Scapular depression and adduction  1. Sit on a stable chair. Support your arms in front of you with pillows, armrests, or a tabletop. Keep your elbows in line with the sides of your body. 2. Gently move your shoulder blades down toward your middle back. Relax the muscles on the tops of your shoulders and in the back of your neck. 3. Hold for 3 seconds. 4. Slowly release the tension and relax your muscles completely before doing this exercise again. Repeat for a total of 10 repetitions. 5. After you have practiced this exercise, try doing the exercise without the arm support. Then, try the exercise while standing instead of sitting. Repeat 2 times. Complete this exercise 3 times per week.  Exercise F: Shoulder abduction, isometric    1. Stand or sit about 4-6 inches (10-15 cm) from a wall with your left / right side facing the wall. 2. Bend your left / right elbow and gently press your elbow against the wall. 3. Increase the pressure slowly until you are pressing as hard as you can without shrugging your shoulder. 4. Hold for 3 seconds. 5. Slowly release the tension and relax your muscles completely. Repeat for a total of 10 repetitions. Repeat 2 times. Complete this exercise 3 times per week.  Exercise G: Shoulder flexion, isometric    1. Stand or sit about 4-6 inches (10-15 cm) away from a wall with your left / right side facing the wall. 2. Keep your left / right elbow straight and gently press the top of your fist against the wall. Increase the pressure slowly until you are pressing as hard as you can  without shrugging your shoulder. 3. Hold for 10-15 seconds. 4. Slowly release the tension and relax your muscles completely. Repeat for a total of 10 repetitions. Repeat 2 times. Complete this exercise 3 times per week.  Exercise H: Internal rotation    1. Sit in a stable chair without armrests, or stand. Secure an exercise band at your left / right side, at elbow height. 2. Place a soft object, such as a folded towel or a small pillow, under your left / right upper arm so your elbow is a few inches (about 8 cm) away from your side. 3. Hold the end of the exercise band so the band stretches. 4. Keeping your elbow pressed against the soft object under your arm, move your forearm across your body toward  your abdomen. Keep your body steady so the movement is only coming from your shoulder. 5. Hold for 3 seconds. 6. Slowly return to the starting position. Repeat for a total of 10 repetitions. Repeat 2 times. Complete this exercise 3 times per week.  Exercise I: External rotation    1. Sit in a stable chair without armrests, or stand. 2. Secure an exercise band at your left / right side, at elbow height. 3. Place a soft object, such as a folded towel or a small pillow, under your left / right upper arm so your elbow is a few inches (about 8 cm) away from your side. 4. Hold the end of the exercise band so the band stretches. 5. Keeping your elbow pressed against the soft object under your arm, move your forearm out, away from your abdomen. Keep your body steady so the movement is only coming from your shoulder. 6. Hold for 3 seconds. 7. Slowly return to the starting position. Repeat for a total of 10 repetitions. Repeat 2 times. Complete this exercise 3 times per week. Exercise J: Shoulder extension  1. Sit in a stable chair without armrests, or stand. Secure an exercise band to a stable object in front of you so the band is at shoulder height. 2. Hold one end of the exercise band in each  hand. Your palms should face each other. 3. Straighten your elbows and lift your hands up to shoulder height. 4. Step back, away from the secured end of the exercise band, until the band stretches. 5. Squeeze your shoulder blades together and pull your hands down to the sides of your thighs. Stop when your hands are straight down by your sides. Do not let your hands go behind your body. 6. Hold for 3 seconds. 7. Slowly return to the starting position. Repeat for a total of 10 repetitions. Repeat 2 times. Complete this exercise 3 times per week.  Exercise K: Shoulder extension, prone    1. Lie on your abdomen on a firm surface so your left / right arm hangs over the edge. 2. Hold a 5 lb weight in your hand so your palm faces in toward your body. Your arm should be straight. 3. Squeeze your shoulder blade down toward the middle of your back. 4. Slowly raise your arm behind you, up to the height of the surface that you are lying on. Keep your arm straight. 5. Hold for 3 seconds. 6. Slowly return to the starting position and relax your muscles. Repeat for a total of 10 repetitions. Repeat 2 times. Complete this exercise 3 times per week.   Exercise L: Horizontal abduction, prone  1. Lie on your abdomen on a firm surface so your left / right arm hangs over the edge. 2. Hold a 5 lb weight in your hand so your palm faces toward your feet. Your arm should be straight. 3. Squeeze your shoulder blade down toward the middle of your back. 4. Bend your elbow so your hand moves up, until your elbow is bent to an "L" shape (90 degrees). With your elbow bent, slowly move your forearm forward and up. Raise your hand up to the height of the surface that you are lying on. ? Your upper arm should not move, and your elbow should stay bent. ? At the top of the movement, your palm should face the floor. 5. Hold for 3 seconds. 6. Slowly return to the starting position and relax your muscles. Repeat for a total  of 10 repetitions. Repeat 2 times. Complete this exercise 3 times per week.  Exercise M: Horizontal abduction, standing  1. Sit on a stable chair, or stand. 2. Secure an exercise band to a stable object in front of you so the band is at shoulder height. 3. Hold one end of the exercise band in each hand. 4. Straighten your elbows and lift your hands straight in front of you, up to shoulder height. Your palms should face down, toward the floor. 5. Step back, away from the secured end of the exercise band, until the band stretches. 6. Move your arms out to your sides, and keep your arms straight. 7. Hold for 3 seconds. 8. Slowly return to the starting position. Repeat for a total of 10 repetitions. Repeat 2 times. Complete this exercise 3 times per week.  Exercise N: Scapular retraction and elevation  1. Sit on a stable chair, or stand. 2. Secure an exercise band to a stable object in front of you so the band is at shoulder height. 3. Hold one end of the exercise band in each hand. Your palms should face each other. 4. Sit in a stable chair without armrests, or stand. 5. Step back, away from the secured end of the exercise band, until the band stretches. 6. Squeeze your shoulder blades together and lift your hands over your head. Keep your elbows straight. 7. Hold for 3 seconds. 8. Slowly return to the starting position. Repeat for a total of 10 repetitions. Repeat 2 times. Complete this exercise 3 times per week.  This information is not intended to replace advice given to you by your health care provider. Make sure you discuss any questions you have with your health care provider. Document Released: 04/05/2005 Document Revised: 12/11/2015 Document Reviewed: 02/20/2015 Elsevier Interactive Patient Education  2017 [employer].

## 2020-05-12 NOTE — Telephone Encounter (Signed)
PA initiated via Covermymeds; KEY: [id number]. Awaiting determination.

## 2020-05-13 NOTE — Telephone Encounter (Signed)
PA approved. Effective from 05/12/2020 through 05/12/2021.

## 2020-05-14 ENCOUNTER — Telehealth: Payer: Self-pay | Admitting: Student

## 2020-05-14 DIAGNOSIS — E7800 Pure hypercholesterolemia, unspecified: Secondary | ICD-10-CM

## 2020-05-14 NOTE — Telephone Encounter (Signed)
Lipid profile testing order sent.

## 2020-05-15 ENCOUNTER — Telehealth: Payer: Self-pay | Admitting: Student

## 2020-05-15 DIAGNOSIS — Z789 Other specified health status: Secondary | ICD-10-CM

## 2020-05-15 DIAGNOSIS — E7800 Pure hypercholesterolemia, unspecified: Secondary | ICD-10-CM

## 2020-05-15 DIAGNOSIS — I6523 Occlusion and stenosis of bilateral carotid arteries: Secondary | ICD-10-CM

## 2020-05-15 LAB — LIPID PANEL WITH LDL/HDL RATIO
Cholesterol, Total: 164 mg/dL (ref 100–199)
HDL: 48 mg/dL (ref >39)
LDL Chol Calc (NIH): 86 mg/dL (ref 0–99)
LDL/HDL Ratio: 1.8 ratio (ref 0.0–3.2)
Triglycerides: 178 mg/dL — ABNORMAL HIGH (ref 0–149)
VLDL Cholesterol Cal: 30 mg/dL (ref 5–40)

## 2020-05-15 MED ORDER — LIVALO 4 MG PO TABS: ORAL_TABLET | ORAL | 3 refills | Status: DC

## 2020-05-15 NOTE — Telephone Encounter (Signed)
Patient's LDL still >70, advised her to increase Livalo to 4 mg four days per week and 2 mg all other days for the next 4 weeks. She will then call the office to report if she is tolerating the increased dose, will consider increasing to 4 mg daily at that time with repeat lipid panel.

## 2020-05-15 NOTE — Telephone Encounter (Signed)
Spoke to patient she is aware of results and verbalized understanding to increase Livalo dose. See telephone encounter for details.

## 2020-05-20 ENCOUNTER — Encounter: Payer: Self-pay | Admitting: Family Medicine

## 2020-05-20 ENCOUNTER — Other Ambulatory Visit: Payer: Self-pay

## 2020-05-20 ENCOUNTER — Ambulatory Visit: Payer: Medicare Other | Admitting: Family Medicine

## 2020-05-20 VITALS — BP 122/78 | HR 73 | Temp 98.0°F | Ht 62.0 in | Wt 235.2 lb

## 2020-05-20 DIAGNOSIS — S46812D Strain of other muscles, fascia and tendons at shoulder and upper arm level, left arm, subsequent encounter: Secondary | ICD-10-CM | POA: Diagnosis not present

## 2020-05-20 DIAGNOSIS — M26629 Arthralgia of temporomandibular joint, unspecified side: Secondary | ICD-10-CM | POA: Diagnosis not present

## 2020-05-20 NOTE — Progress Notes (Signed)
Chief Complaint  Patient presents with  . Jaw Pain    Neck and shoulder pain     Subjective: Patient is a 75 y.o. female here for f/u jaw/shoulder pain.  She is here with her husband.  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  She is given home stretches and exercises for the trapezius but forgot about them.  She is not seeing physical therapy.  She does have a dentist who just retired and is wondering if she needs to find a new one for the TMJ issue.  Again, no recent injury or change in activity or other issue.  She is having some radiating pain towards her sinus area but denies any allergy symptoms or congestion/rhinorrhea.  She is having some tingling down her left upper extremity as well.  She is not dropping things.  Past Medical History:  Diagnosis Date  . Anxiety   . Complication of anesthesia   . Concussion November 09, 2012   From a fall at home on deck  . Depression    postpartum depression after 2nd child  . Diabetes mellitus ([place])   . Fatty liver   . GERD (gastroesophageal reflux disease)   . HLD (hyperlipidemia)   . Hypertension   . Morbid obesity ([place])   . OSA on CPAP    uses CPAP nightly  . Osteoarthritis of right knee 07/31/2013  . PONV (postoperative nausea and vomiting)   . Rosacea   . Severe aortic stenosis     Objective: BP 122/78 (BP Location: Left Arm, Patient Position: Sitting, Cuff Size: Normal)   Pulse 73   Temp 98 F (36.7 C) (Oral)   Ht 5\' 2"  (1.575 m)   Wt 235 lb 4 oz (106.7 kg)   SpO2 97%   BMI 43.03 kg/m  General: Awake, appears stated age MSK: + TTP over the left trapezius region; there is also tenderness to palpation over the left TMJ without jaw deviation or clicking/popping.   Neuro: Negative Spurling's. 5/5 strength throughout the upper extremities.  Biceps reflex 2/4 bilaterally without clonus.  No cerebellar signs. Lungs: No accessory muscle use Psych: Age appropriate judgment and insight,  normal affect and mood  Assessment and Plan: TMJ syndrome  Strain of left trapezius muscle, subsequent encounter  1. Will f/u w dentist. PT if no better. Avoid chewy foods.  2. Needs to do the stretches/exercises. Heat. Ice. PT if no better. The patient voiced understanding and agreement to the plan.  [place], DO 05/20/20  2:48 PM

## 2020-05-20 NOTE — Patient Instructions (Addendum)
Make an appointment with your dentist.   Heat (pad or rice pillow in microwave) over affected area, 10-15 minutes twice daily.   Ice/cold pack over area for 10-15 min twice daily.  OK to take Tylenol 1000 mg (2 extra strength tabs) or 975 mg (3 regular strength tabs) every 6 hours as needed.  Send me a message in 3 weeks if no better and we will set you up with physical therapy.   Let us know if you need anything.

## 2020-07-05 ENCOUNTER — Other Ambulatory Visit: Payer: Self-pay | Admitting: Cardiology

## 2020-07-15 ENCOUNTER — Ambulatory Visit (INDEPENDENT_AMBULATORY_CARE_PROVIDER_SITE_OTHER): Payer: Medicare Other | Admitting: Family Medicine

## 2020-07-15 ENCOUNTER — Encounter: Payer: Self-pay | Admitting: Family Medicine

## 2020-07-15 ENCOUNTER — Other Ambulatory Visit: Payer: Self-pay

## 2020-07-15 VITALS — BP 112/78 | HR 83 | Temp 98.2°F | Ht 62.0 in | Wt 228.0 lb

## 2020-07-15 DIAGNOSIS — L659 Nonscarring hair loss, unspecified: Secondary | ICD-10-CM | POA: Diagnosis not present

## 2020-07-15 DIAGNOSIS — F321 Major depressive disorder, single episode, moderate: Secondary | ICD-10-CM

## 2020-07-15 DIAGNOSIS — M79641 Pain in right hand: Secondary | ICD-10-CM

## 2020-07-15 DIAGNOSIS — E1169 Type 2 diabetes mellitus with other specified complication: Secondary | ICD-10-CM | POA: Diagnosis not present

## 2020-07-15 DIAGNOSIS — E669 Obesity, unspecified: Secondary | ICD-10-CM

## 2020-07-15 DIAGNOSIS — M79642 Pain in left hand: Secondary | ICD-10-CM

## 2020-07-15 LAB — CBC
HCT: 37.2 % (ref 36.0–46.0)
Hemoglobin: 12.2 g/dL (ref 12.0–15.0)
MCHC: 32.7 g/dL (ref 30.0–36.0)
MCV: 88.1 fl (ref 78.0–100.0)
Platelets: 229 10*3/uL (ref 150.0–400.0)
RBC: 4.22 Mil/uL (ref 3.87–5.11)
RDW: 14.1 % (ref 11.5–15.5)
WBC: 14.7 10*3/uL — ABNORMAL HIGH (ref 4.0–10.5)

## 2020-07-15 LAB — SEDIMENTATION RATE: Sed Rate: 88 mm/hr — ABNORMAL HIGH (ref 0–30)

## 2020-07-15 LAB — IBC + FERRITIN
Ferritin: 117.5 ng/mL (ref 10.0–291.0)
Iron: 57 ug/dL (ref 42–145)
Saturation Ratios: 16.8 % — ABNORMAL LOW (ref 20.0–50.0)
Transferrin: 242 mg/dL (ref 212.0–360.0)

## 2020-07-15 LAB — VITAMIN D 25 HYDROXY (VIT D DEFICIENCY, FRACTURES): VITD: 27.06 ng/mL — ABNORMAL LOW (ref 30.00–100.00)

## 2020-07-15 NOTE — Progress Notes (Signed)
Chief Complaint  Patient presents with  . wants to do lab work for RA    Subjective: Patient is a 75 y.o. female here for hand pain.  The patient has a history of bilateral hand pain and chronic cervical and thoracic pain.  She sees a pain specialist who yesterday said she should consider being tested for rheumatoid arthritis.  She sometimes get a dry mouth and dry eyes.  She has a history of rosacea.  She has never been tested for autoimmune disease before.  Of note, she also mentions her hair has been thinning on the top of her head.  Diet and exercise has been normal.  No blood in her stool or urine.  Thyroid levels were normal with her endocrinologist around a month ago.  Past Medical History:  Diagnosis Date  . Anxiety   . Complication of anesthesia   . Concussion November 09, 2012   From a fall at home on deck  . Depression    postpartum depression after 2nd child  . Diabetes mellitus ([place])   . Fatty liver   . GERD (gastroesophageal reflux disease)   . HLD (hyperlipidemia)   . Hypertension   . Morbid obesity ([place])   . OSA on CPAP    uses CPAP nightly  . Osteoarthritis of right knee 07/31/2013  . PONV (postoperative nausea and vomiting)   . Rosacea   . Severe aortic stenosis     Objective: BP 112/78 (BP Location: Left Arm, Patient Position: Sitting, Cuff Size: Normal)   Pulse 83   Temp 98.2 F (36.8 C) (Oral)   Ht 5\' 2"  (1.575 m)   Wt 228 lb (103.4 kg)   SpO2 98%   BMI 41.70 kg/m  General: Awake, appears stated age HEENT: MMM, appropriate pooling of saliva on the floor of the mouth MSK: There is no tenderness to palpation over the joints of her hands, I do not appreciate any significant effusion of the hands bilaterally Skin: No rashes noted Heart: RRR Lungs: CTAB, no rales, wheezes or rhonchi. No accessory muscle use Psych: Age appropriate judgment and insight, normal affect and mood  Assessment and Plan: Bilateral hand pain - Plan: Sedimentation rate, ANA,IFA RA  Diag Pnl w/rflx Tit/Patn, Rheumatoid Factor, Anti-Smith antibody, Anti-DNA antibody, double-stranded, Sjogrens syndrome-A extractable nuclear antibody, Sjogrens syndrome-B extractable nuclear antibody, Aldolase, VITAMIN D 25 Hydroxy (Vit-D Deficiency, Fractures)  Hair thinning - Plan: CBC, IBC + Ferritin, CANCELED: TSH  Depression, major, single episode, moderate (HCC), Chronic  Diabetes mellitus type 2 in obese [facility]), Chronic  Morbid obesity ([place]), Chronic  Check above labs.  Add blood count and iron levels for the hair thinning.  Consider taking vitamin B10. The patient voiced understanding and agreement to the plan.  [place], DO 07/15/20  1:30 PM

## 2020-07-15 NOTE — Patient Instructions (Addendum)
Give me about a week to get the results of your labs back. These aren't the most common orders we place.    Consider taking biotin for the hair.   Let us know if you need anything.

## 2020-07-16 LAB — ANTI-SMITH ANTIBODY: ENA SM Ab Ser-aCnc: <1 AI

## 2020-07-16 LAB — ALDOLASE: Aldolase: 6.8 U/L (ref <=8.1)

## 2020-07-17 LAB — ANA,IFA RA DIAG PNL W/RFLX TIT/PATN
Anti Nuclear Antibody (ANA): NEGATIVE
Cyclic Citrullin Peptide Ab: <16 UNITS
Rheumatoid fact SerPl-aCnc: <14 IU/mL (ref <14)

## 2020-07-17 LAB — ANTI-DNA ANTIBODY, DOUBLE-STRANDED: ds DNA Ab: <1 IU/mL

## 2020-07-17 LAB — SJOGRENS SYNDROME-A EXTRACTABLE NUCLEAR ANTIBODY: SSA (Ro) (ENA) Antibody, IgG: <1 AI

## 2020-07-17 LAB — SJOGRENS SYNDROME-B EXTRACTABLE NUCLEAR ANTIBODY: SSB (La) (ENA) Antibody, IgG: <1 AI

## 2020-07-18 DIAGNOSIS — L659 Nonscarring hair loss, unspecified: Secondary | ICD-10-CM

## 2020-07-18 DIAGNOSIS — F321 Major depressive disorder, single episode, moderate: Secondary | ICD-10-CM

## 2020-07-18 NOTE — Telephone Encounter (Signed)
Patient would like call back, in reference to lab results

## 2020-07-23 ENCOUNTER — Other Ambulatory Visit: Payer: Self-pay | Admitting: Family Medicine

## 2020-07-23 ENCOUNTER — Telehealth: Payer: Self-pay | Admitting: Family Medicine

## 2020-07-23 DIAGNOSIS — R7989 Other specified abnormal findings of blood chemistry: Secondary | ICD-10-CM

## 2020-07-23 NOTE — Telephone Encounter (Signed)
See result notes. 

## 2020-07-23 NOTE — Telephone Encounter (Signed)
Patient returning your call for lab

## 2020-07-24 ENCOUNTER — Other Ambulatory Visit: Payer: Self-pay | Admitting: Family Medicine

## 2020-08-08 NOTE — Addendum Note (Signed)
Addended by: Jeronimo Greaves on: 08/08/2020 09:59 AM   Modules accepted: Orders

## 2020-08-08 NOTE — Telephone Encounter (Signed)
Is it okay to drop orders for thyroid ? If so which ones

## 2020-08-13 ENCOUNTER — Other Ambulatory Visit: Payer: Self-pay | Admitting: Family Medicine

## 2020-08-13 MED ORDER — FLUOXETINE HCL 40 MG PO CAPS: 40.0000 mg | ORAL_CAPSULE | Freq: Every day | ORAL | 1 refills | Status: DC

## 2020-08-19 ENCOUNTER — Ambulatory Visit (INDEPENDENT_AMBULATORY_CARE_PROVIDER_SITE_OTHER): Payer: Medicare Other | Admitting: Family Medicine

## 2020-08-19 ENCOUNTER — Encounter: Payer: Self-pay | Admitting: Family Medicine

## 2020-08-19 ENCOUNTER — Other Ambulatory Visit: Payer: Self-pay

## 2020-08-19 VITALS — BP 120/70 | HR 73 | Temp 98.8°F | Ht 62.0 in | Wt 220.5 lb

## 2020-08-19 DIAGNOSIS — K2900 Acute gastritis without bleeding: Secondary | ICD-10-CM | POA: Diagnosis not present

## 2020-08-19 NOTE — Progress Notes (Signed)
Chief Complaint  Patient presents with  . Abdominal Pain     Subjective Kara Lee is a 75 y.o. female who presents with abd pain.  Symptoms began 2 d ago Patient was experiencing N/C and diffuse abd pain.  Patient denies vomiting, diarrhea, fever, sweats, arthralgias, URI symptoms and bleeding Tx to date: None Sick contacts: none known  Past Medical History:  Diagnosis Date  . Anxiety   . Complication of anesthesia   . Concussion November 09, 2012   From a fall at home on deck  . Depression    postpartum depression after 2nd child  . Diabetes mellitus ([place])   . Fatty liver   . GERD (gastroesophageal reflux disease)   . HLD (hyperlipidemia)   . Hypertension   . Morbid obesity ([place])   . OSA on CPAP    uses CPAP nightly  . Osteoarthritis of right knee 07/31/2013  . PONV (postoperative nausea and vomiting)   . Rosacea   . Severe aortic stenosis    Past Surgical History:  Procedure Laterality Date  . ABDOMINAL HYSTERECTOMY     still has uterus  . ANKLE RECONSTRUCTION Left   . ARTERY BIOPSY Left 01/21/2020   Procedure: BIOPSY TEMPORAL ARTERY;  Surgeon: Leta Baptist, MD;  Location: [facility];  Service: ENT;  Laterality: Left;  . BIOPSY BREAST Bilateral   . 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 CESAREAN SECTION     x 2  . CHOLECYSTECTOMY    . COLONOSCOPY    . EYE SURGERY     lasik; X3 for crossed eyes; 1 eyelid surgery  . HERNIA REPAIR    . PARTIAL KNEE ARTHROPLASTY Right 07/31/2013   Procedure: RIGHT UNICOMPARTMENTAL KNEE;  Surgeon: Johnny Bridge, MD;  Location: [facility];  Service: Orthopedics;  Laterality: Right;  . RIGHT/LEFT HEART CATH AND CORONARY ANGIOGRAPHY N/A 10/04/2017   Procedure: RIGHT/LEFT HEART CATH AND CORONARY ANGIOGRAPHY;  Surgeon: Adrian Prows, MD;  Location: [facility];  Service: Cardiovascular;  Laterality: N/A;  . TEE WITHOUT CARDIOVERSION N/A 08/31/2017   Procedure: TRANSESOPHAGEAL ECHOCARDIOGRAM (TEE);  Surgeon: Adrian Prows,  MD;  Location: [facility];  Service: Cardiovascular;  Laterality: N/A;  . TEE WITHOUT CARDIOVERSION N/A 01/30/2019   Procedure: TRANSESOPHAGEAL ECHOCARDIOGRAM (TEE);  Surgeon: Sherren Mocha, MD;  Location: [place];  Service: Open Heart Surgery;  Laterality: N/A;  . TRANSCATHETER AORTIC VALVE REPLACEMENT, TRANSFEMORAL N/A 01/30/2019   Procedure: TRANSCATHETER AORTIC VALVE REPLACEMENT, TRANSFEMORAL;  Surgeon: Sherren Mocha, MD;  Location: [place] [facility];  Service: Open Heart Surgery;  Laterality: N/A;  . TUBAL LIGATION      Allergies  Allergen Reactions  . Bee Venom Itching and Swelling  . Latex Itching and Swelling  . Lovenox [Enoxaparin Sodium] Other (See Comments)    Bruising  . Minocycline Nausea And Vomiting  . Morphine And Related Nausea And Vomiting  . Nickel Other (See Comments)    Contact dermatitis   . Shellfish Allergy Itching  . Statins     Muscle aches  . Sulfa Antibiotics Nausea And Vomiting  . Tape Itching    Bandaids and adhesive tape  . Cipro [Ciprofloxacin Hcl] Rash  . Glimepiride Rash    Exam BP 120/70 (BP Location: Left Arm, Patient Position: Sitting, Cuff Size: Normal)   Pulse 73   Temp 98.8 F (37.1 C) (Oral)   Ht 5\' 2"  (1.575 m)   Wt 220 lb 8 oz (100 kg)  SpO2 98%   BMI 40.33 kg/m  General:  well developed, well hydrated, in no apparent distress Skin:  warm, no pallor or diaphoresis, no rashes Throat/Pharynx:  lips and gingiva without lesion; tongue and uvula midline; non-inflamed pharynx; no exudates or postnasal drainage Neck: neck supple without adenopathy, thyromegaly, or masses Lungs:  clear to auscultation, breath sounds equal bilaterally, no respiratory distress, no wheezes Cardio:  regular rate and rhythm without murmurs Abdomen:  abdomen soft, nontender; bowel sounds normal; no masses or organomegaly Extremities:  no clubbing, cyanosis, or edema Psych: Appropriate judgement/insight  Assessment and Plan  Acute  gastritis without hemorrhage, unspecified gastritis type  I think she's getting better. Needs to drink more water. Filled out form for trip insurance to the best of my ability and knowledge.  F/u if symptoms fail to improve, sooner if worsening. The patient voiced understanding and agreement to the plan.  [place], DO 08/19/20  3:54 PM

## 2020-08-19 NOTE — Patient Instructions (Signed)
Try to drink 55-60 oz of water daily outside of exercise. Do your best to stay hydrated.  Send me a message if anything changes.  Let us know if you need anything.

## 2020-08-22 ENCOUNTER — Other Ambulatory Visit: Payer: Medicare Other

## 2020-08-29 ENCOUNTER — Other Ambulatory Visit (INDEPENDENT_AMBULATORY_CARE_PROVIDER_SITE_OTHER): Payer: Medicare Other

## 2020-08-29 ENCOUNTER — Other Ambulatory Visit: Payer: Self-pay

## 2020-08-29 DIAGNOSIS — F321 Major depressive disorder, single episode, moderate: Secondary | ICD-10-CM | POA: Diagnosis not present

## 2020-08-29 DIAGNOSIS — L659 Nonscarring hair loss, unspecified: Secondary | ICD-10-CM | POA: Diagnosis not present

## 2020-08-29 DIAGNOSIS — R7989 Other specified abnormal findings of blood chemistry: Secondary | ICD-10-CM

## 2020-08-29 LAB — CBC WITH DIFFERENTIAL/PLATELET
Basophils Absolute: 0.1 10*3/uL (ref 0.0–0.1)
Basophils Relative: 0.6 % (ref 0.0–3.0)
Eosinophils Absolute: 0.3 10*3/uL (ref 0.0–0.7)
Eosinophils Relative: 3.7 % (ref 0.0–5.0)
HCT: 37.5 % (ref 36.0–46.0)
Hemoglobin: 12.3 g/dL (ref 12.0–15.0)
Lymphocytes Relative: 28.6 % (ref 12.0–46.0)
Lymphs Abs: 2.5 10*3/uL (ref 0.7–4.0)
MCHC: 32.9 g/dL (ref 30.0–36.0)
MCV: 88.9 fl (ref 78.0–100.0)
Monocytes Absolute: 0.6 10*3/uL (ref 0.1–1.0)
Monocytes Relative: 7.4 % (ref 3.0–12.0)
Neutro Abs: 5.2 10*3/uL (ref 1.4–7.7)
Neutrophils Relative %: 59.7 % (ref 43.0–77.0)
Platelets: 203 10*3/uL (ref 150.0–400.0)
RBC: 4.22 Mil/uL (ref 3.87–5.11)
RDW: 13.8 % (ref 11.5–15.5)
WBC: 8.6 10*3/uL (ref 4.0–10.5)

## 2020-08-29 LAB — TSH: TSH: 1.39 u[IU]/mL (ref 0.35–4.50)

## 2020-08-29 LAB — T4, FREE: Free T4: 0.73 ng/dL (ref 0.60–1.60)

## 2020-09-01 LAB — PATHOLOGIST SMEAR REVIEW

## 2020-09-05 ENCOUNTER — Other Ambulatory Visit: Payer: Self-pay

## 2020-09-05 DIAGNOSIS — Z789 Other specified health status: Secondary | ICD-10-CM

## 2020-09-05 DIAGNOSIS — E7800 Pure hypercholesterolemia, unspecified: Secondary | ICD-10-CM

## 2020-09-05 DIAGNOSIS — I6523 Occlusion and stenosis of bilateral carotid arteries: Secondary | ICD-10-CM

## 2020-09-05 MED ORDER — LIVALO 4 MG PO TABS: ORAL_TABLET | ORAL | 3 refills | Status: DC

## 2020-09-17 ENCOUNTER — Ambulatory Visit: Payer: Medicare Other | Attending: Internal Medicine

## 2020-09-17 ENCOUNTER — Encounter: Payer: Self-pay | Admitting: Family Medicine

## 2020-09-17 ENCOUNTER — Ambulatory Visit (INDEPENDENT_AMBULATORY_CARE_PROVIDER_SITE_OTHER): Payer: Medicare Other | Admitting: Family Medicine

## 2020-09-17 ENCOUNTER — Other Ambulatory Visit: Payer: Self-pay

## 2020-09-17 VITALS — BP 118/70 | HR 66 | Temp 98.1°F | Ht 62.0 in | Wt 220.4 lb

## 2020-09-17 DIAGNOSIS — E1169 Type 2 diabetes mellitus with other specified complication: Secondary | ICD-10-CM | POA: Diagnosis not present

## 2020-09-17 DIAGNOSIS — Z23 Encounter for immunization: Secondary | ICD-10-CM

## 2020-09-17 DIAGNOSIS — Z0001 Encounter for general adult medical examination with abnormal findings: Secondary | ICD-10-CM | POA: Diagnosis not present

## 2020-09-17 DIAGNOSIS — E669 Obesity, unspecified: Secondary | ICD-10-CM | POA: Diagnosis not present

## 2020-09-17 DIAGNOSIS — R5381 Other malaise: Secondary | ICD-10-CM | POA: Diagnosis not present

## 2020-09-17 DIAGNOSIS — R809 Proteinuria, unspecified: Secondary | ICD-10-CM | POA: Diagnosis not present

## 2020-09-17 DIAGNOSIS — Z1159 Encounter for screening for other viral diseases: Secondary | ICD-10-CM

## 2020-09-17 LAB — COMPREHENSIVE METABOLIC PANEL
ALT: 19 U/L (ref 0–35)
AST: 21 U/L (ref 0–37)
Albumin: 4.2 g/dL (ref 3.5–5.2)
Alkaline Phosphatase: 69 U/L (ref 39–117)
BUN: 20 mg/dL (ref 6–23)
CO2: 24 mEq/L (ref 19–32)
Calcium: 9.5 mg/dL (ref 8.4–10.5)
Chloride: 103 mEq/L (ref 96–112)
Creatinine, Ser: 0.67 mg/dL (ref 0.40–1.20)
GFR: 87.24 mL/min (ref >60.00)
Glucose, Bld: 105 mg/dL — ABNORMAL HIGH (ref 70–99)
Potassium: 4.4 mEq/L (ref 3.5–5.1)
Sodium: 136 mEq/L (ref 135–145)
Total Bilirubin: 0.5 mg/dL (ref 0.2–1.2)
Total Protein: 6.8 g/dL (ref 6.0–8.3)

## 2020-09-17 LAB — LIPID PANEL
Cholesterol: 164 mg/dL (ref 0–200)
HDL: 62.5 mg/dL (ref >39.00)
LDL Cholesterol: 80 mg/dL (ref 0–99)
NonHDL: 101.53
Total CHOL/HDL Ratio: 3
Triglycerides: 110 mg/dL (ref 0.0–149.0)
VLDL: 22 mg/dL (ref 0.0–40.0)

## 2020-09-17 LAB — HEMOGLOBIN A1C: Hgb A1c MFr Bld: 7.4 % — ABNORMAL HIGH (ref 4.6–6.5)

## 2020-09-17 MED ORDER — LOSARTAN POTASSIUM 25 MG PO TABS: ORAL_TABLET | ORAL | 2 refills | Status: DC

## 2020-09-17 NOTE — Progress Notes (Signed)
Chief Complaint  Patient presents with  . Annual Exam     Well Woman Kara Lee is here for a complete physical.   Her last physical was >1 year ago.  Current diet: in general, diet is fair. Current exercise: not much. Weight is steadily decreased and she denies daytime fatigue. Seatbelt? Yes  Pt reports her functionality and strength have progressively been getting worse. She has chronic back/neck pain. No new neuro s/s's. No recent inj or change in activity. Exercise is minimal. Wondering if PT would help.   Health Maintenance Colonoscopy- Yes Shingrix- No DEXA- Yes Mammogram- Yes Tetanus- Yes Pneumonia- Yes Hep C screen- No  Past Medical History:  Diagnosis Date  . Anxiety   . Complication of anesthesia   . Concussion November 09, 2012   From a fall at home on deck  . Depression    postpartum depression after 2nd child  . Diabetes mellitus ([place])   . Fatty liver   . GERD (gastroesophageal reflux disease)   . HLD (hyperlipidemia)   . Hypertension   . Morbid obesity ([place])   . OSA on CPAP    uses CPAP nightly  . Osteoarthritis of right knee 07/31/2013  . PONV (postoperative nausea and vomiting)   . Rosacea   . Severe aortic stenosis      Past Surgical History:  Procedure Laterality Date  . ABDOMINAL HYSTERECTOMY     still has uterus  . ANKLE RECONSTRUCTION Left   . ARTERY BIOPSY Left 01/21/2020   Procedure: BIOPSY TEMPORAL ARTERY;  Surgeon: Leta Baptist, MD;  Location: [facility];  Service: ENT;  Laterality: Left;  . BIOPSY BREAST Bilateral   . 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 CESAREAN SECTION     x 2  . CHOLECYSTECTOMY    . COLONOSCOPY    . EYE SURGERY     lasik; X3 for crossed eyes; 1 eyelid surgery  . HERNIA REPAIR    . PARTIAL KNEE ARTHROPLASTY Right 07/31/2013   Procedure: RIGHT UNICOMPARTMENTAL KNEE;  Surgeon: Johnny Bridge, MD;  Location: [facility];  Service: Orthopedics;  Laterality: Right;  . RIGHT/LEFT HEART CATH AND CORONARY  ANGIOGRAPHY N/A 10/04/2017   Procedure: RIGHT/LEFT HEART CATH AND CORONARY ANGIOGRAPHY;  Surgeon: Adrian Prows, MD;  Location: [facility];  Service: Cardiovascular;  Laterality: N/A;  . TEE WITHOUT CARDIOVERSION N/A 08/31/2017   Procedure: TRANSESOPHAGEAL ECHOCARDIOGRAM (TEE);  Surgeon: Adrian Prows, MD;  Location: [facility];  Service: Cardiovascular;  Laterality: N/A;  . TEE WITHOUT CARDIOVERSION N/A 01/30/2019   Procedure: TRANSESOPHAGEAL ECHOCARDIOGRAM (TEE);  Surgeon: Sherren Mocha, MD;  Location: [facility];  Service: Open Heart Surgery;  Laterality: N/A;  . TRANSCATHETER AORTIC VALVE REPLACEMENT, TRANSFEMORAL N/A 01/30/2019   Procedure: TRANSCATHETER AORTIC VALVE REPLACEMENT, TRANSFEMORAL;  Surgeon: Sherren Mocha, MD;  Location: [facility];  Service: Open Heart Surgery;  Laterality: N/A;  . TUBAL LIGATION      Medications  Current Outpatient Medications on File Prior to Visit  Medication Sig Dispense Refill  . acetaminophen (TYLENOL) 650 MG CR tablet Take 1,300 mg by mouth every 8 (eight) hours as needed for pain.    Marland Kitchen aspirin EC 81 MG tablet Take 81 mg by mouth at bedtime.     Marland Kitchen atenolol (TENORMIN) 25 MG tablet TAKE 1 TABLET BY MOUTH TWICE DAILY 180 tablet 2  . clonazePAM (KLONOPIN) 0.5 MG tablet Take 0.5-1 tablets (0.25-0.5 mg total) by mouth 2 (two)  times daily as needed for anxiety. Take 1/2 by mouth twice daily 90 tablet 1  . COVID-19 mRNA vaccine, Pfizer, 30 MCG/0.3ML injection INJECT AS DIRECTED .3 mL 0  . diphenhydrAMINE (BENADRYL) 25 mg capsule Take 25 mg by mouth as needed.    Marland Kitchen esomeprazole (NEXIUM) 40 MG capsule Take 40 mg by mouth daily.    Marland Kitchen estradiol (ESTRACE) 0.1 MG/GM vaginal cream Place 1 Applicatorful vaginally as needed (irritation).     Marland Kitchen FLUoxetine (PROZAC) 40 MG capsule Take 1 capsule (40 mg total) by mouth daily. 90 capsule 1  . fluticasone (FLONASE) 50 MCG/ACT nasal spray Place 1 spray into both nostrils daily as needed for allergies.      . hydrocortisone (ANUSOL-HC) 2.5 % rectal cream Place 1 application rectally daily as needed for hemorrhoids.     . insulin glargine (LANTUS) 100 UNIT/ML injection Inject 10 Units into the skin daily. Sliding scale whil on prednisone    . losartan (COZAAR) 25 MG tablet TAKE 1 TABLET(25 MG) BY MOUTH DAILY 90 tablet 2  . metFORMIN (GLUCOPHAGE-XR) 500 MG 24 hr tablet TAKE 2 TABLETS(1000 MG) BY MOUTH DAILY 180 tablet 2  . methocarbamol (ROBAXIN) 500 MG tablet Take 500 mg by mouth 4 (four) times daily.    . Pitavastatin Calcium (LIVALO) 4 MG TABS Take 4mg  (1 tablet) four days per week, take 2mg  (1/2 tablet) all other days. 90 tablet 3  . Propylene Glycol 0.6 % SOLN Place 1 drop into both eyes daily as needed (dry eyes).    . sitaGLIPtin (JANUVIA) 100 MG tablet Take 100 mg by mouth daily.     Allergies Allergies  Allergen Reactions  . Bee Venom Itching and Swelling  . Latex Itching and Swelling  . Lovenox [Enoxaparin Sodium] Other (See Comments)    Bruising  . Minocycline Nausea And Vomiting  . Morphine And Related Nausea And Vomiting  . Nickel Other (See Comments)    Contact dermatitis   . Shellfish Allergy Itching  . Statins     Muscle aches  . Sulfa Antibiotics Nausea And Vomiting  . Tape Itching    Bandaids and adhesive tape  . Cipro [Ciprofloxacin Hcl] Rash  . Glimepiride Rash    Review of Systems: Constitutional:  no fevers Eye:  no recent significant change in vision Ears:  No recent changes in hearing Nose/Mouth/Throat:  no complaints of nasal congestion, no sore throat Cardiovascular: no chest pain Respiratory:  No shortness of breath Gastrointestinal:  No change in bowel habits GU:  Female: negative for dysuria Integumentary:  no abnormal skin lesions reported Neurologic:  no headaches Endocrine:  denies unexplained weight changes  Exam BP 118/70 (BP Location: Left Arm, Patient Position: Sitting, Cuff Size: Normal)   Pulse 66   Temp 98.1 F (36.7 C) (Oral)   Ht  5\' 2"  (1.575 m)   Wt 220 lb 6 oz (100 kg)   SpO2 98%   BMI 40.31 kg/m  General:  well developed, well nourished, in no apparent distress Skin:  no significant moles, warts, or growths Head:  no masses, lesions, or tenderness Eyes:  pupils equal and round, sclera anicteric without injection Ears:  canals without lesions, TMs shiny without retraction, no obvious effusion, no erythema Nose:  nares patent, septum midline, mucosa normal, and no drainage or sinus tenderness Throat/Pharynx:  lips and gingiva without lesion; tongue and uvula midline; non-inflamed pharynx; no exudates or postnasal drainage Neck: neck supple without adenopathy, thyromegaly, or masses Lungs:  clear to auscultation,  breath sounds equal bilaterally, no respiratory distress Cardio:  regular rate and rhythm, no bruits or LE edema Abdomen:  abdomen soft, nontender; bowel sounds normal; no masses or organomegaly Genital: Deferred Neuro:  gait normal; deep tendon reflexes normal and symmetric Psych: well oriented with normal range of affect and appropriate judgment/insight  Assessment and Plan  Encounter for well adult exam with abnormal findings  Physical deconditioning - Plan: Ambulatory referral to Physical Therapy  Diabetes mellitus type 2 in obese ([place]) - Plan: Comprehensive metabolic panel, Microalbumin / creatinine urine ratio, Hemoglobin A1c, Lipid panel  Encounter for hepatitis C screening test for low risk patient - Plan: Hepatitis C antibody  Proteinuria, unspecified type - Plan: losartan (COZAAR) 25 MG tablet   Well 75 y.o. female. Counseled on diet and exercise. Other orders as above. For physical deconditioning, rec PT and yoga at home. Cont w specialist for her back. Injections have been helpful.  Follow up in 6 mo for DM visit. The patient voiced understanding and agreement to the plan.  [place], DO 09/17/20 10:05 AM

## 2020-09-17 NOTE — Progress Notes (Signed)
   Covid-19 Vaccination Clinic  Name:  TIFFONY KITE    MRN: [id number] DOB: April 17, 1946  09/17/2020  Ms. Mordan was observed post Covid-19 immunization for 15 minutes without incident. She was provided with Vaccine Information Sheet and instruction to access the V-Safe system.   Ms. Daloia was instructed to call 911 with any severe reactions post vaccine: Marland Kitchen Difficulty breathing  . Swelling of face and throat  . A fast heartbeat  . A bad rash all over body  . Dizziness and weakness   Immunizations Administered    Name Date Dose VIS Date Route   PFIZER Comrnaty(Gray TOP) Covid-19 Vaccine 09/17/2020 10:15 AM 0.3 mL 03/27/2020 Intramuscular   Manufacturer: [employer]   Lot: [id number]   NDC: [id number]

## 2020-09-17 NOTE — Patient Instructions (Addendum)
Give [place] 2-3 business days to get the results of your labs back.   Keep the diet clean and stay active.  I recommend getting the covid booster and Shingrix vaccine series.   If you do not hear anything about your referral in the next 1-2 weeks, call our office and ask for an update.  Let us know if you need anything.

## 2020-09-18 ENCOUNTER — Other Ambulatory Visit: Payer: Self-pay

## 2020-09-18 DIAGNOSIS — Z789 Other specified health status: Secondary | ICD-10-CM

## 2020-09-18 DIAGNOSIS — I6523 Occlusion and stenosis of bilateral carotid arteries: Secondary | ICD-10-CM

## 2020-09-18 DIAGNOSIS — E7800 Pure hypercholesterolemia, unspecified: Secondary | ICD-10-CM

## 2020-09-18 LAB — HEPATITIS C ANTIBODY
Hepatitis C Ab: NONREACTIVE
SIGNAL TO CUT-OFF: 0 (ref <1.00)

## 2020-09-18 MED ORDER — ESOMEPRAZOLE MAGNESIUM 40 MG PO CPDR: 40.0000 mg | DELAYED_RELEASE_CAPSULE | Freq: Every day | ORAL | 2 refills | Status: DC

## 2020-09-18 MED ORDER — LIVALO 4 MG PO TABS: ORAL_TABLET | ORAL | 1 refills | Status: DC

## 2020-09-18 NOTE — Telephone Encounter (Signed)
Yes that is fine, you can send it as she is taking it.

## 2020-09-19 LAB — MICROALBUMIN / CREATININE URINE RATIO
Creatinine,U: 120.7 mg/dL
Microalb Creat Ratio: 5.5 mg/g (ref 0.0–30.0)
Microalb, Ur: 6.6 mg/dL — ABNORMAL HIGH (ref 0.0–1.9)

## 2020-09-22 ENCOUNTER — Other Ambulatory Visit (HOSPITAL_BASED_OUTPATIENT_CLINIC_OR_DEPARTMENT_OTHER): Payer: Self-pay

## 2020-09-22 MED ORDER — PFIZER-BIONT COVID-19 VAC-TRIS 30 MCG/0.3ML IM SUSP
INTRAMUSCULAR | 0 refills | Status: DC
  Filled 2020-09-22: qty 0.3, 1d supply, fill #0

## 2020-09-23 ENCOUNTER — Other Ambulatory Visit (HOSPITAL_BASED_OUTPATIENT_CLINIC_OR_DEPARTMENT_OTHER): Payer: Self-pay

## 2020-09-26 MED ORDER — SITAGLIPTIN PHOSPHATE 100 MG PO TABS: 100.0000 mg | ORAL_TABLET | Freq: Every day | ORAL | 2 refills | Status: DC

## 2020-09-30 ENCOUNTER — Other Ambulatory Visit: Payer: Self-pay | Admitting: Family Medicine

## 2020-09-30 DIAGNOSIS — R5381 Other malaise: Secondary | ICD-10-CM

## 2020-09-30 NOTE — Progress Notes (Signed)
Referral rehab

## 2020-10-22 ENCOUNTER — Encounter: Payer: Self-pay | Admitting: Family Medicine

## 2020-10-22 ENCOUNTER — Ambulatory Visit (INDEPENDENT_AMBULATORY_CARE_PROVIDER_SITE_OTHER): Payer: Medicare Other | Admitting: Family Medicine

## 2020-10-22 ENCOUNTER — Other Ambulatory Visit: Payer: Self-pay

## 2020-10-22 VITALS — BP 120/72 | HR 85 | Temp 98.6°F | Ht 62.0 in | Wt 227.1 lb

## 2020-10-22 DIAGNOSIS — L304 Erythema intertrigo: Secondary | ICD-10-CM

## 2020-10-22 DIAGNOSIS — L299 Pruritus, unspecified: Secondary | ICD-10-CM | POA: Diagnosis not present

## 2020-10-22 MED ORDER — CLOTRIMAZOLE-BETAMETHASONE 1-0.05 % EX CREA: TOPICAL_CREAM | CUTANEOUS | 0 refills | Status: DC

## 2020-10-22 MED ORDER — TRIAMCINOLONE ACETONIDE 0.1 % EX CREA: TOPICAL_CREAM | CUTANEOUS | 0 refills | Status: DC

## 2020-10-22 NOTE — Progress Notes (Signed)
Chief Complaint  Patient presents with   Rash    Kara Lee is a 75 y.o. female here for a skin complaint.  Duration: 1 week Location: back, L armpit Pruritic? Yes Painful? No Drainage? No New soaps/lotions/topicals/detergents? No Sick contacts? No Other associated symptoms: felt raw under armpit Therapies tried thus far: TAO  Past Medical History:  Diagnosis Date   Anxiety    Complication of anesthesia    Concussion November 09, 2012   From a fall at home on deck   Depression    postpartum depression after 2nd child   Diabetes mellitus ([place])    Fatty liver    GERD (gastroesophageal reflux disease)    HLD (hyperlipidemia)    Hypertension    Morbid obesity ([facility])    OSA on CPAP    uses CPAP nightly   Osteoarthritis of right knee 07/31/2013   PONV (postoperative nausea and vomiting)    Rosacea    Severe aortic stenosis     BP 120/72   Pulse 85   Temp 98.6 F (37 C) (Oral)   Ht 5\' 2"  (1.575 m)   Wt 227 lb 2 oz (103 kg)   SpO2 95%   BMI 41.54 kg/m  Gen: awake, alert, appearing stated age Lungs: No accessory muscle use Skin: No lesions on back, evidence of scratching. Under L lateral fold of adipose, there is a purple patch following the fold and overlying scaling. No drainage, erythema, TTP, fluctuance, excoriation Psych: Age appropriate judgment and insight  Pruritic dermatitis - Plan: triamcinolone cream (KENALOG) 0.1 %  Intertrigo - Plan: clotrimazole-betamethasone (LOTRISONE) cream  Orders as above. Avoid scented products, try not to itch, cold can help. Cont Allegra.  F/u prn. The patient voiced understanding and agreement to the plan.  [place], DO 10/22/20 3:31 PM

## 2020-10-22 NOTE — Patient Instructions (Addendum)
Try not to itch. Cold can help. Avoid scented products.   Foods that may reduce pain: 1) Ginger 2) Blueberries 3) Salmon 4) Pumpkin seeds 5) dark chocolate 6) turmeric 7) tart cherries 8) virgin olive oil 9) chilli peppers 10) mint 11) red wine  Let us know if you need anything.

## 2020-10-31 ENCOUNTER — Other Ambulatory Visit: Payer: Self-pay | Admitting: Family Medicine

## 2020-10-31 DIAGNOSIS — F411 Generalized anxiety disorder: Secondary | ICD-10-CM

## 2020-10-31 DIAGNOSIS — F321 Major depressive disorder, single episode, moderate: Secondary | ICD-10-CM

## 2020-10-31 NOTE — Telephone Encounter (Signed)
From pt

## 2020-11-03 NOTE — Telephone Encounter (Signed)
Last OV--10/22/2020 Last RF---04/08/20  #90 with 1 refill

## 2020-11-25 ENCOUNTER — Encounter: Payer: Self-pay | Admitting: Family Medicine

## 2020-11-25 ENCOUNTER — Other Ambulatory Visit: Payer: Self-pay

## 2020-11-25 ENCOUNTER — Ambulatory Visit (INDEPENDENT_AMBULATORY_CARE_PROVIDER_SITE_OTHER): Payer: Medicare Other | Admitting: Family Medicine

## 2020-11-25 VITALS — BP 120/70 | HR 72 | Temp 98.5°F | Ht 62.0 in | Wt 226.0 lb

## 2020-11-25 DIAGNOSIS — L509 Urticaria, unspecified: Secondary | ICD-10-CM | POA: Diagnosis not present

## 2020-11-25 DIAGNOSIS — F321 Major depressive disorder, single episode, moderate: Secondary | ICD-10-CM | POA: Diagnosis not present

## 2020-11-25 DIAGNOSIS — M797 Fibromyalgia: Secondary | ICD-10-CM

## 2020-11-25 MED ORDER — MONTELUKAST SODIUM 10 MG PO TABS: 10.0000 mg | ORAL_TABLET | Freq: Every day | ORAL | 3 refills | Status: DC

## 2020-11-25 MED ORDER — BUPROPION HCL ER (XL) 150 MG PO TB24: 150.0000 mg | ORAL_TABLET | Freq: Every day | ORAL | 1 refills | Status: DC

## 2020-11-25 NOTE — Patient Instructions (Addendum)
YouTube tai chi or yoga.   Aim to do some physical exertion for 150 minutes per week. This is typically divided into 5 days per week, 30 minutes per day. The activity should be enough to get your heart rate up. Anything is better than nothing if you have time constraints.  Please consider counseling. Contact [phone] to schedule an appointment or inquire about cost/insurance coverage.  Consider an antihistamine twice daily.   [email or web address]  Let us know if you need anything. 

## 2020-11-25 NOTE — Progress Notes (Signed)
Chief Complaint  Patient presents with   Urticaria   Depression    Discuss fibromyalgia     Subjective: Patient is a 74 y.o. female here for chronic pain.  Depression Feels hopeless lately in addition to depression. Her spouse is stuck in a routine that does not involve her much. She takes Prozac 40 mg/d. No AE's and reports compliance. She tries to stay walking but has not been lately. No HI. She has SI intermittently. She does have access to deadly weapons. She had tried to harm herself when her son was born due to post partum depression. She has no plan or desire.  She is not following with a counselor or psychologist.  No self-medication.  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  She always has a baseline pain, particularly with palpation, that affects both sides of her bodies from neck to legs.  There is been no specific injury or change in activity that is triggered this.  She had a 1 week flare last week.  Things have improved since then.  She has not had a negative work-up for autoimmune issues.  She is not having any bruising, redness, or swelling.  Urticaria Told to take Zyrtec 3-4 times daily and hydroxyzine. Can't take the latter she feels it affects her heart rate.  She has been taking Claritin 10 mg daily.  When she has a hive outbreak, she is wondering what she is able to take.  Past Medical History:  Diagnosis Date   Anxiety    Complication of anesthesia    Concussion November 09, 2012   From a fall at home on deck   Depression    postpartum depression after 2nd child   Diabetes mellitus ([facility])    Fatty liver    GERD (gastroesophageal reflux disease)    HLD (hyperlipidemia)    Hypertension    Morbid obesity ([facility])    OSA on CPAP    uses CPAP nightly   Osteoarthritis of right knee 07/31/2013   PONV (postoperative nausea and vomiting)    Rosacea    Severe aortic stenosis     Objective: BP 120/70   Pulse  72   Temp 98.5 F (36.9 C) (Oral)   Ht 5' 2" (1.575 m)   Wt 226 lb (102.5 kg)   SpO2 99%   BMI 41.34 kg/m  General: Awake, appears stated age Heart: RRR, no lower extremity edema Lungs: CTAB, no rales, wheezes or rhonchi. No accessory muscle use MSK: The patient has numerous tender points bilaterally over her trapezius, erector spinae muscle group, and paraspinal musculature, lateral arm and forearm musculature, lateral thighs and leg muscles bilaterally. Neuro: No cerebellar signs, gait is cautious Psych: Age appropriate judgment and insight, normal affect and mood  Assessment and Plan: Depression, major, single episode, moderate ([facility]) - Plan: buPROPion (WELLBUTRIN XL) 150 MG 24 hr tablet  Fibromyalgia  Urticaria - Plan: montelukast (SINGULAIR) 10 MG tablet  I think there is a situational component with her depression.  Continue Prozac 40 mg daily.  Add bupropion 150 mg daily.  Counseling information provided.  Counseled on exercise.  I would like to see her in 1 month to recheck this.  I also advised marital counseling and having a frank conversation with her husband regarding spending more quality time together.  She does not believe he will respond well to these suggestions. She likely has fibromyalgia based off of negative work-up thus far   and exam.  I think treating #1 will help with this as well.  Continue Prozac and Wellbutrin as above.  Recommended using YouTube for tai chi and/or yoga.  Fibromyalgia educational link was provided in her paperwork as well. Add Singulair, consider Pepcid in addition to Claritin during flares.  I would be okay with her using Zyrtec twice daily but I have never seen it prescribed more often than that, though I am not a specialist. The patient voiced understanding and agreement to the plan.  I spent 45 minutes with the patient discussing the above plan and reviewing her chart on the same day of the visit.  [place], DO 11/25/20  4:27  PM

## 2020-12-23 ENCOUNTER — Other Ambulatory Visit: Payer: Self-pay | Admitting: Family Medicine

## 2020-12-29 ENCOUNTER — Other Ambulatory Visit: Payer: Self-pay

## 2020-12-29 ENCOUNTER — Encounter: Payer: Self-pay | Admitting: Family Medicine

## 2020-12-29 ENCOUNTER — Ambulatory Visit (INDEPENDENT_AMBULATORY_CARE_PROVIDER_SITE_OTHER): Payer: Medicare Other | Admitting: Family Medicine

## 2020-12-29 VITALS — BP 108/64 | HR 67 | Temp 98.0°F | Ht 62.0 in | Wt 226.0 lb

## 2020-12-29 DIAGNOSIS — F411 Generalized anxiety disorder: Secondary | ICD-10-CM | POA: Diagnosis not present

## 2020-12-29 DIAGNOSIS — F321 Major depressive disorder, single episode, moderate: Secondary | ICD-10-CM | POA: Diagnosis not present

## 2020-12-29 MED ORDER — MIRTAZAPINE 7.5 MG PO TABS: 7.5000 mg | ORAL_TABLET | Freq: Every day | ORAL | 2 refills | Status: DC

## 2020-12-29 NOTE — Progress Notes (Signed)
Chief Complaint  Patient presents with   Follow-up    Subjective [place] presents for f/u anxiety/depression.  Pt is currently being treated with Prozac 40 mg/d.  Reports the same since treatment as she could not tolerate the Wellbutrin XL 150 mg/d.  It caused her insomnia.  No thoughts of harming self or others. No self-medication with alcohol, prescription drugs or illicit drugs. Pt is not following with a counselor/psychologist.  Past Medical History:  Diagnosis Date   Anxiety    Complication of anesthesia    Concussion November 09, 2012   From a fall at home on deck   Depression    postpartum depression after 2nd child   Diabetes mellitus ([place])    Fatty liver    GERD (gastroesophageal reflux disease)    HLD (hyperlipidemia)    Hypertension    Morbid obesity ([facility])    OSA on CPAP    uses CPAP nightly   Osteoarthritis of right knee 07/31/2013   PONV (postoperative nausea and vomiting)    Rosacea    Severe aortic stenosis    Allergies as of 12/29/2020       Reactions   Bee Venom Itching, Swelling   Hydroxyzine Hcl Other (See Comments)   Latex Itching, Swelling   Lovenox [enoxaparin Sodium] Other (See Comments)   Bruising   Minocycline Nausea And Vomiting   Morphine And Related Nausea And Vomiting   Nickel Other (See Comments)   Contact dermatitis    Shellfish Allergy Itching   Statins    Muscle aches   Sulfa Antibiotics Nausea And Vomiting   Tape Itching   Bandaids and adhesive tape   Cipro [ciprofloxacin Hcl] Rash   Ciprofloxacin Rash   Glimepiride Rash   Metronidazole Rash        Medication List        Accurate as of December 29, 2020  2:45 PM. If you have any questions, ask your nurse or doctor.          STOP taking these medications    buPROPion 150 MG 24 hr tablet Commonly known as: Wellbutrin XL Stopped by: Shelda Pal, DO       TAKE these medications    acetaminophen 650 MG CR tablet Commonly known as:  TYLENOL Take 1,300 mg by mouth every 8 (eight) hours as needed for pain.   aspirin EC 81 MG tablet Take 81 mg by mouth at bedtime.   atenolol 25 MG tablet Commonly known as: TENORMIN TAKE 1 TABLET BY MOUTH TWICE DAILY   clonazePAM 0.5 MG tablet Commonly known as: KLONOPIN TAKE 1/2 TO 1 TABLET BY MOUTH TWICE DAILY AS NEEDED FOR ANXIETY   clotrimazole-betamethasone cream Commonly known as: LOTRISONE Apply to area under breast twice daily.   esomeprazole 40 MG capsule Commonly known as: NEXIUM Take 1 capsule (40 mg total) by mouth daily.   estradiol 0.1 MG/GM vaginal cream Commonly known as: ESTRACE Place 1 Applicatorful vaginally as needed (irritation).   FLUoxetine 40 MG capsule Commonly known as: PROZAC Take 1 capsule (40 mg total) by mouth daily.   fluticasone 50 MCG/ACT nasal spray Commonly known as: FLONASE Place 1 spray into both nostrils daily as needed for allergies.   insulin glargine 100 UNIT/ML injection Commonly known as: LANTUS Inject 10 Units into the skin daily. Sliding scale whil on prednisone   Januvia 100 MG tablet Generic drug: sitaGLIPtin TAKE 1 TABLET(100 MG) BY MOUTH DAILY   Livalo 4 MG Tabs Generic drug: Pitavastatin  Calcium Take 1/2 tab 5 times a week and 2 whole tabs the other two days.   losartan 25 MG tablet Commonly known as: COZAAR TAKE 1 TABLET(25 MG) BY MOUTH DAILY   metFORMIN 500 MG 24 hr tablet Commonly known as: GLUCOPHAGE-XR TAKE 2 TABLETS(1000 MG) BY MOUTH DAILY   mirtazapine 7.5 MG tablet Commonly known as: REMERON Take 1 tablet (7.5 mg total) by mouth at bedtime. Started by: Crosby Oyster Wenzel Backlund, DO   montelukast 10 MG tablet Commonly known as: SINGULAIR Take 1 tablet (10 mg total) by mouth at bedtime.   Pfizer-BioNTech COVID-19 Vacc 30 MCG/0.3ML injection Generic drug: COVID-19 mRNA vaccine (Pfizer) INJECT AS DIRECTED   Pfizer-BioNT COVID-19 Vac-TriS Susp injection Generic drug: COVID-19 mRNA Vac-TriS  (Pfizer) Inject into the muscle.   Propylene Glycol 0.6 % Soln Place 1 drop into both eyes daily as needed (dry eyes).   triamcinolone cream 0.1 % Commonly known as: KENALOG Apply to itchy areas on back twice daily.        Exam BP 108/64   Pulse 67   Temp 98 F (36.7 C) (Oral)   Ht '5\' 2"'$  (1.575 m)   Wt 226 lb (102.5 kg)   SpO2 98%   BMI 41.34 kg/m  General:  well developed, well nourished, in no apparent distress Lungs:  No respiratory distress Psych: well oriented with normal range of affect and age-appropriate judgement/insight, alert and oriented x4.  Assessment and Plan  Depression, major, single episode, moderate ([facility]) - Plan: mirtazapine (REMERON) 7.5 MG tablet  GAD (generalized anxiety disorder) - Plan: mirtazapine (REMERON) 7.5 MG tablet  Chronic, uncontrolled. Add Remeron 7.5 mg qhs, Cont Prozac 40 mg/d. Suggested a designated date night with her husband which I think with strengthen their relationship and thus decrease a sig stressor for above.  F/u in 1 mo. The patient voiced understanding and agreement to the plan.  [place], DO 12/29/20 2:45 PM

## 2020-12-29 NOTE — Patient Instructions (Signed)
Keep the diet clean and stay active.  Continue the Prozac.   Had some designated date nights with Josph Macho.   Let us know if you need anything.

## 2021-01-07 ENCOUNTER — Encounter: Payer: Self-pay | Admitting: Family Medicine

## 2021-01-20 ENCOUNTER — Ambulatory Visit: Payer: Medicare Other | Attending: Internal Medicine

## 2021-01-20 ENCOUNTER — Other Ambulatory Visit (HOSPITAL_BASED_OUTPATIENT_CLINIC_OR_DEPARTMENT_OTHER): Payer: Self-pay

## 2021-01-20 DIAGNOSIS — Z23 Encounter for immunization: Secondary | ICD-10-CM

## 2021-01-20 MED ORDER — INFLUENZA VAC A&B SA ADJ QUAD 0.5 ML IM PRSY
PREFILLED_SYRINGE | INTRAMUSCULAR | 0 refills | Status: DC
  Filled 2021-01-20: qty 0.5, 1d supply, fill #0

## 2021-01-20 NOTE — Progress Notes (Signed)
   Covid-19 Vaccination Clinic  Name:  Kara Lee    MRN: [id number] DOB: [birth year]  01/20/2021  [name] was observed post Covid-19 immunization for 15 minutes without incident. She was provided with Vaccine Information Sheet and instruction to access the V-Safe system.   [name] was instructed to call 911 with any severe reactions post vaccine: Difficulty breathing  Swelling of face and throat  A fast heartbeat  A bad rash all over body  Dizziness and weakness

## 2021-01-30 ENCOUNTER — Other Ambulatory Visit (HOSPITAL_BASED_OUTPATIENT_CLINIC_OR_DEPARTMENT_OTHER): Payer: Self-pay

## 2021-01-30 MED ORDER — COVID-19MRNA BIVAL VACC PFIZER 30 MCG/0.3ML IM SUSP
INTRAMUSCULAR | 0 refills | Status: DC
  Filled 2021-01-30: qty 0.3, 1d supply, fill #0

## 2021-02-22 ENCOUNTER — Other Ambulatory Visit: Payer: Self-pay | Admitting: Family Medicine

## 2021-02-22 DIAGNOSIS — L304 Erythema intertrigo: Secondary | ICD-10-CM

## 2021-02-23 ENCOUNTER — Other Ambulatory Visit: Payer: Medicare Other

## 2021-02-23 ENCOUNTER — Other Ambulatory Visit: Payer: Self-pay | Admitting: Family Medicine

## 2021-02-25 DIAGNOSIS — L304 Erythema intertrigo: Secondary | ICD-10-CM

## 2021-02-25 MED ORDER — CLOTRIMAZOLE-BETAMETHASONE 1-0.05 % EX CREA: TOPICAL_CREAM | CUTANEOUS | 0 refills | Status: DC

## 2021-02-26 ENCOUNTER — Ambulatory Visit: Payer: Medicare Other

## 2021-02-26 ENCOUNTER — Other Ambulatory Visit: Payer: Medicare Other

## 2021-02-26 DIAGNOSIS — I10 Essential (primary) hypertension: Secondary | ICD-10-CM

## 2021-02-26 DIAGNOSIS — Z952 Presence of prosthetic heart valve: Secondary | ICD-10-CM

## 2021-03-04 MED ORDER — CLOTRIMAZOLE-BETAMETHASONE 1-0.05 % EX CREA: TOPICAL_CREAM | CUTANEOUS | 0 refills | Status: DC

## 2021-03-04 NOTE — Addendum Note (Signed)
Addended by: Sharon Seller B on: 03/04/2021 10:25 AM   Modules accepted: Orders

## 2021-03-11 ENCOUNTER — Ambulatory Visit: Payer: Medicare Other | Admitting: Cardiology

## 2021-03-13 ENCOUNTER — Encounter: Payer: Self-pay | Admitting: Family Medicine

## 2021-03-16 ENCOUNTER — Telehealth (INDEPENDENT_AMBULATORY_CARE_PROVIDER_SITE_OTHER): Payer: Medicare Other | Admitting: Family Medicine

## 2021-03-16 ENCOUNTER — Encounter: Payer: Self-pay | Admitting: Family Medicine

## 2021-03-16 DIAGNOSIS — R6889 Other general symptoms and signs: Secondary | ICD-10-CM | POA: Diagnosis not present

## 2021-03-16 MED ORDER — BENZONATATE 200 MG PO CAPS: 200.0000 mg | ORAL_CAPSULE | Freq: Two times a day (BID) | ORAL | 0 refills | Status: DC | PRN

## 2021-03-16 NOTE — Progress Notes (Signed)
Chief Complaint  Patient presents with   Cough    Congestion Headache and body aches No fever.     Kara Lee here for URI complaints. Due to COVID-19 pandemic, we are interacting via web portal for an electronic face-to-face visit. I verified patient's ID using 2 identifiers. Patient agreed to proceed with visit via this method. Patient is at home, I am at office. Patient and I are present for visit.   Duration: 4 days  Associated symptoms: sinus headache, sinus congestion, rhinorrhea, sore throat, productive cough (mucus), myalgia, and wheezing/sob earlier.  Denies: sinus pain, itchy watery eyes, ear pain, ear drainage, and fevers Treatment to date: Tylenol, Dayquil Sick contacts: Yes, spouse Tested neg for covid.   Past Medical History:  Diagnosis Date   Anxiety    Complication of anesthesia    Concussion November 09, 2012   From a fall at home on deck   Depression    postpartum depression after 2nd child   Diabetes mellitus ([place])    Fatty liver    GERD (gastroesophageal reflux disease)    HLD (hyperlipidemia)    Hypertension    Morbid obesity ([facility])    OSA on CPAP    uses CPAP nightly   Osteoarthritis of right knee 07/31/2013   PONV (postoperative nausea and vomiting)    Rosacea    Severe aortic stenosis     Objective No conversational dyspnea Age appropriate judgment and insight Nml affect and mood  Flu-like symptoms - Plan: benzonatate (TESSALON) 200 MG capsule  Given timing, would think flu. Outside of window for antiviral. Orders as above prn. Will also rec continuing Phenergan DM. If no improvement, will send Hycodan. Doubt bacterial infection at this time.  Continue to push fluids, practice good hand hygiene, cover mouth when coughing. F/u prn. If starting to experience fevers, shaking, or shortness of breath, seek immediate care. Pt voiced understanding and agreement to the plan.  [place], DO 03/16/21 11:26 AM

## 2021-03-18 ENCOUNTER — Other Ambulatory Visit: Payer: Self-pay | Admitting: Family Medicine

## 2021-03-18 MED ORDER — AZITHROMYCIN 250 MG PO TABS: ORAL_TABLET | ORAL | 0 refills | Status: DC

## 2021-03-23 ENCOUNTER — Encounter: Payer: Self-pay | Admitting: Family Medicine

## 2021-03-23 ENCOUNTER — Other Ambulatory Visit: Payer: Self-pay | Admitting: Family Medicine

## 2021-03-23 DIAGNOSIS — J069 Acute upper respiratory infection, unspecified: Secondary | ICD-10-CM

## 2021-03-24 ENCOUNTER — Other Ambulatory Visit: Payer: Self-pay | Admitting: Family Medicine

## 2021-03-24 MED ORDER — FLUTICASONE PROPIONATE HFA 110 MCG/ACT IN AERO: 2.0000 | INHALATION_SPRAY | Freq: Two times a day (BID) | RESPIRATORY_TRACT | 1 refills | Status: DC

## 2021-03-27 ENCOUNTER — Ambulatory Visit (INDEPENDENT_AMBULATORY_CARE_PROVIDER_SITE_OTHER): Payer: Medicare Other | Admitting: Family

## 2021-03-27 VITALS — BP 120/60 | HR 69 | Temp 97.9°F | Ht 62.0 in | Wt 221.0 lb

## 2021-03-27 DIAGNOSIS — J9801 Acute bronchospasm: Secondary | ICD-10-CM

## 2021-03-27 DIAGNOSIS — J019 Acute sinusitis, unspecified: Secondary | ICD-10-CM

## 2021-03-27 MED ORDER — AMOXICILLIN-POT CLAVULANATE 875-125 MG PO TABS: 1.0000 | ORAL_TABLET | Freq: Two times a day (BID) | ORAL | 0 refills | Status: AC

## 2021-03-27 MED ORDER — BENZONATATE 200 MG PO CAPS: 200.0000 mg | ORAL_CAPSULE | Freq: Two times a day (BID) | ORAL | 0 refills | Status: DC | PRN

## 2021-03-27 MED ORDER — FLUTICASONE FUROATE-VILANTEROL 200-25 MCG/ACT IN AEPB: 1.0000 | INHALATION_SPRAY | Freq: Every day | RESPIRATORY_TRACT | 0 refills | Status: DC

## 2021-03-27 NOTE — Progress Notes (Signed)
Kara Lee is a 75 y.o. female with the following history as recorded in EpicCare:  Patient Active Problem List   Diagnosis Date Noted   Fibromyalgia 11/25/2020   Essential hypertension 03/03/2020   GAD (generalized anxiety disorder) 03/03/2020   Depression, major, single episode, moderate ([place]) 02/11/2020   Diabetes mellitus type 2 in obese ([place]) 10/24/2019   Proteinuria 10/24/2019   S/P TAVR (transcatheter aortic valve replacement) Transcatheter Aortic Valve Replacement - Percutaneous Transfemoral Approach Edwards Sapien 3 Ultra THV (size 26 mm ) 01/30/2019 01/30/2019   Diabetes mellitus (HCC)    GERD (gastroesophageal reflux disease)    HLD (hyperlipidemia)    OSA on CPAP 10/02/2017   Morbid obesity ([place]) 10/02/2017   Severe aortic stenosis    Osteoarthritis of right knee 07/31/2013    Current Outpatient Medications  Medication Sig Dispense Refill   acetaminophen (TYLENOL) 650 MG CR tablet Take 1,300 mg by mouth every 8 (eight) hours as needed for pain.     amoxicillin-clavulanate (AUGMENTIN) 875-125 MG tablet Take 1 tablet by mouth 2 (two) times daily for 10 days. 20 tablet 0   aspirin EC 81 MG tablet Take 81 mg by mouth at bedtime.      atenolol (TENORMIN) 25 MG tablet TAKE 1 TABLET BY MOUTH TWICE DAILY 180 tablet 2   clonazePAM (KLONOPIN) 0.5 MG tablet TAKE 1/2 TO 1 TABLET BY MOUTH TWICE DAILY AS NEEDED FOR ANXIETY 90 tablet 1   clotrimazole-betamethasone (LOTRISONE) cream APPLY TOPICALLY UNDER BREAST TWICE DAILY 30 g 0   esomeprazole (NEXIUM) 40 MG capsule Take 1 capsule (40 mg total) by mouth daily. 90 capsule 2   estradiol (ESTRACE) 0.1 MG/GM vaginal cream Place 1 Applicatorful vaginally as needed (irritation).      FLUoxetine (PROZAC) 40 MG capsule TAKE 1 CAPSULE(40 MG) BY MOUTH DAILY 90 capsule 1   fluticasone (FLONASE) 50 MCG/ACT nasal spray Place 1 spray into both nostrils daily as needed for allergies.      fluticasone furoate-vilanterol (BREO ELLIPTA) 200-25 MCG/ACT  AEPB Inhale 1 puff into the lungs daily. 1 each 0   insulin glargine (LANTUS) 100 UNIT/ML injection Inject 10 Units into the skin daily. Sliding scale whil on prednisone     losartan (COZAAR) 25 MG tablet TAKE 1 TABLET(25 MG) BY MOUTH DAILY 90 tablet 2   metFORMIN (GLUCOPHAGE-XR) 500 MG 24 hr tablet TAKE 2 TABLETS(1000 MG) BY MOUTH DAILY 180 tablet 2   mirtazapine (REMERON) 7.5 MG tablet Take 1 tablet (7.5 mg total) by mouth at bedtime. 30 tablet 2   montelukast (SINGULAIR) 10 MG tablet Take 1 tablet (10 mg total) by mouth at bedtime. 30 tablet 3   Pitavastatin Calcium (LIVALO) 4 MG TABS Take 1/2 tab 5 times a week and 2 whole tabs the other two days. 90 tablet 1   promethazine-dextromethorphan (PROMETHAZINE-DM) 6.25-15 MG/5ML syrup TAKE 5 ML BY MOUTH FOUR TIMES DAILY AS NEEDED 118 mL 0   Propylene Glycol 0.6 % SOLN Place 1 drop into both eyes daily as needed (dry eyes).     triamcinolone cream (KENALOG) 0.1 % Apply to itchy areas on back twice daily. 30 g 0   benzonatate (TESSALON) 200 MG capsule Take 1 capsule (200 mg total) by mouth 2 (two) times daily as needed for cough. 20 capsule 0   COVID-19 mRNA bivalent vaccine, Pfizer, injection Inject into the muscle. 0.3 mL 0   JANUVIA 100 MG tablet TAKE 1 TABLET(100 MG) BY MOUTH DAILY (Patient not taking: Reported on 03/27/2021)  30 tablet 2   No current facility-administered medications for this visit.    Allergies: Bee venom, Hydroxyzine hcl, Latex, Lovenox [enoxaparin sodium], Minocycline, Morphine and related, Nickel, Shellfish allergy, Statins, Sulfa antibiotics, Tape, Cipro [ciprofloxacin hcl], Ciprofloxacin, Glimepiride, and Metronidazole  Past Medical History:  Diagnosis Date   Anxiety    Complication of anesthesia    Concussion November 09, 2012   From a fall at home on deck   Depression    postpartum depression after 2nd child   Diabetes mellitus ([place])    Fatty liver    GERD (gastroesophageal reflux disease)    HLD (hyperlipidemia)     Hypertension    Morbid obesity (HCC)    OSA on CPAP    uses CPAP nightly   Osteoarthritis of right knee 07/31/2013   PONV (postoperative nausea and vomiting)    Rosacea    Severe aortic stenosis     Past Surgical History:  Procedure Laterality Date   ABDOMINAL HYSTERECTOMY     still has uterus   ANKLE RECONSTRUCTION Left    ARTERY BIOPSY Left 01/21/2020   Procedure: BIOPSY TEMPORAL ARTERY;  Surgeon: Leta Baptist, MD;  Location: [facility];  Service: ENT;  Laterality: Left;   BIOPSY BREAST Bilateral    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   CESAREAN SECTION     x 2   CHOLECYSTECTOMY     COLONOSCOPY     EYE SURGERY     lasik; X3 for crossed eyes; 1 eyelid surgery   HERNIA REPAIR     PARTIAL KNEE ARTHROPLASTY Right 07/31/2013   Procedure: RIGHT UNICOMPARTMENTAL KNEE;  Surgeon: Johnny Bridge, MD;  Location: [facility];  Service: Orthopedics;  Laterality: Right;   RIGHT/LEFT HEART CATH AND CORONARY ANGIOGRAPHY N/A 10/04/2017   Procedure: RIGHT/LEFT HEART CATH AND CORONARY ANGIOGRAPHY;  Surgeon: Adrian Prows, MD;  Location: [facility];  Service: Cardiovascular;  Laterality: N/A;   TEE WITHOUT CARDIOVERSION N/A 08/31/2017   Procedure: TRANSESOPHAGEAL ECHOCARDIOGRAM (TEE);  Surgeon: Adrian Prows, MD;  Location: [facility];  Service: Cardiovascular;  Laterality: N/A;   TEE WITHOUT CARDIOVERSION N/A 01/30/2019   Procedure: TRANSESOPHAGEAL ECHOCARDIOGRAM (TEE);  Surgeon: Sherren Mocha, MD;  Location: [facility];  Service: Open Heart Surgery;  Laterality: N/A;   TRANSCATHETER AORTIC VALVE REPLACEMENT, TRANSFEMORAL N/A 01/30/2019   Procedure: TRANSCATHETER AORTIC VALVE REPLACEMENT, TRANSFEMORAL;  Surgeon: Sherren Mocha, MD;  Location: [facility];  Service: Open Heart Surgery;  Laterality: N/A;   TUBAL LIGATION      Family History  Problem Relation Age of Onset   Pancreatitis Mother    COPD Mother    Cancer - Lung Mother    Diabetes type II Mother     Breast cancer Maternal Grandmother    Cataracts Maternal Grandfather     Social History   Tobacco Use   Smoking status: Never   Smokeless tobacco: Never  Substance Use Topics   Alcohol use: Not Currently    Subjective:  Lingering cough x 2 weeks; had virtual visit at the end of November- was prescribed Z-pak; just can't seem to shake the cough; has been prescribed steroid inhaler but unable to get from insurance; hesitant to take prednisone due to history of Type 2 Diabetes;     Objective:  Vitals:   03/27/21 1259  BP: 120/60  Pulse: 69  Temp: 97.9 F (36.6 C)  TempSrc: Oral  SpO2: 97%  Weight: 221 lb (100.2 kg)  Height: 5\' 2"  (1.575 m)    General: Well developed, well nourished, in no acute distress  Skin : Warm and dry.  Head: Normocephalic and atraumatic  Eyes: Sclera and conjunctiva clear; pupils round and reactive to light; extraocular movements intact  Ears: External normal; canals clear; tympanic membranes normal  Oropharynx: Pink, supple. No suspicious lesions  Neck: Supple without thyromegaly, adenopathy  Lungs: Respirations unlabored; clear to auscultation bilaterally without wheeze, rales, rhonchi  CVS exam: normal rate and regular rhythm.  Neurologic: Alert and oriented; speech intact; face symmetrical; moves all extremities well; CNII-XII intact without focal deficit   Assessment:  1. Acute bronchospasm   2. Acute sinusitis, recurrence not specified, unspecified location     Plan:  Rx for Augmentin 875 mg bid x 10 days, Rx for BREO 200 1 inhalation qd, Rx for Tessalon Perles; increase fluids, rest and follow up worse, no better; if cannot get BREO covered, to consider oral prednisone;   This visit occurred during the SARS-CoV-2 public health emergency.  Safety protocols were in place, including screening questions prior to the visit, additional usage of staff PPE, and extensive cleaning of exam room while observing appropriate contact time as indicated for  disinfecting solutions.    No follow-ups on file.  No orders of the defined types were placed in this encounter.   Requested Prescriptions   Signed Prescriptions Disp Refills   fluticasone furoate-vilanterol (BREO ELLIPTA) 200-25 MCG/ACT AEPB 1 each 0    Sig: Inhale 1 puff into the lungs daily.   amoxicillin-clavulanate (AUGMENTIN) 875-125 MG tablet 20 tablet 0    Sig: Take 1 tablet by mouth 2 (two) times daily for 10 days.   benzonatate (TESSALON) 200 MG capsule 20 capsule 0    Sig: Take 1 capsule (200 mg total) by mouth 2 (two) times daily as needed for cough.

## 2021-03-31 ENCOUNTER — Other Ambulatory Visit: Payer: Self-pay | Admitting: Cardiology

## 2021-04-06 ENCOUNTER — Ambulatory Visit: Payer: Medicare Other | Admitting: Cardiology

## 2021-04-08 ENCOUNTER — Encounter: Payer: Self-pay | Admitting: Family Medicine

## 2021-04-08 ENCOUNTER — Other Ambulatory Visit: Payer: Self-pay | Admitting: Family Medicine

## 2021-04-08 ENCOUNTER — Other Ambulatory Visit: Payer: Self-pay

## 2021-04-08 DIAGNOSIS — F411 Generalized anxiety disorder: Secondary | ICD-10-CM

## 2021-04-08 DIAGNOSIS — F321 Major depressive disorder, single episode, moderate: Secondary | ICD-10-CM

## 2021-04-08 MED ORDER — ATENOLOL 25 MG PO TABS: 25.0000 mg | ORAL_TABLET | Freq: Two times a day (BID) | ORAL | 0 refills | Status: DC

## 2021-04-08 MED ORDER — CLONAZEPAM 0.5 MG PO TABS: 0.2500 mg | ORAL_TABLET | Freq: Two times a day (BID) | ORAL | 1 refills | Status: DC | PRN

## 2021-04-16 ENCOUNTER — Other Ambulatory Visit: Payer: Self-pay

## 2021-04-16 ENCOUNTER — Ambulatory Visit: Payer: Medicare Other | Admitting: Cardiology

## 2021-04-16 ENCOUNTER — Encounter: Payer: Self-pay | Admitting: Cardiology

## 2021-04-16 VITALS — BP 120/77 | HR 70 | Temp 97.9°F | Resp 17 | Ht 62.0 in | Wt 223.0 lb

## 2021-04-16 DIAGNOSIS — E7800 Pure hypercholesterolemia, unspecified: Secondary | ICD-10-CM

## 2021-04-16 DIAGNOSIS — I6523 Occlusion and stenosis of bilateral carotid arteries: Secondary | ICD-10-CM

## 2021-04-16 DIAGNOSIS — I10 Essential (primary) hypertension: Secondary | ICD-10-CM

## 2021-04-16 DIAGNOSIS — Z952 Presence of prosthetic heart valve: Secondary | ICD-10-CM

## 2021-04-16 MED ORDER — ZYPITAMAG 4 MG PO TABS: 1.0000 | ORAL_TABLET | Freq: Every day | ORAL | 3 refills | Status: DC

## 2021-04-16 NOTE — Progress Notes (Signed)
Primary Physician/Referring:  Shelda Pal, DO  Patient ID: Kara Lee, female    DOB: July 05, 1945, 75 y.o.   MRN: [id number]  Chief Complaint  Patient presents with   Follow-up   s/p TAVR   HPI:    Kara Lee  is a 75 y.o. 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  Presents for annual visit.  Presently doing well and essentially remains asymptomatic.  She has not had any dyspnea on exertion, no leg edema, no PND or orthopnea.  Past Medical History:  Diagnosis Date   Anxiety    Complication of anesthesia    Concussion November 09, 2012   From a fall at home on deck   Depression    postpartum depression after 2nd child   Diabetes mellitus ([place])    Fatty liver    GERD (gastroesophageal reflux disease)    HLD (hyperlipidemia)    Hypertension    Morbid obesity (HCC)    OSA on CPAP    uses CPAP nightly   Osteoarthritis of right knee 07/31/2013   PONV (postoperative nausea and vomiting)    Rosacea    Severe aortic stenosis    Past Surgical History:  Procedure Laterality Date   ABDOMINAL HYSTERECTOMY     still has uterus   ANKLE RECONSTRUCTION Left    ARTERY BIOPSY Left 01/21/2020   Procedure: BIOPSY TEMPORAL ARTERY;  Surgeon: Leta Baptist, MD;  Location: [facility];  Service: ENT;  Laterality: Left;   BIOPSY BREAST Bilateral    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   CESAREAN SECTION     x 2   CHOLECYSTECTOMY     COLONOSCOPY     EYE SURGERY     lasik; X3 for crossed eyes; 1 eyelid surgery   HERNIA REPAIR     PARTIAL KNEE ARTHROPLASTY Right 07/31/2013   Procedure: RIGHT UNICOMPARTMENTAL KNEE;  Surgeon: Johnny Bridge, MD;  Location: [facility];  Service: Orthopedics;  Laterality: Right;   RIGHT/LEFT HEART CATH AND CORONARY ANGIOGRAPHY N/A 10/04/2017   Procedure: RIGHT/LEFT HEART CATH AND  CORONARY ANGIOGRAPHY;  Surgeon: Adrian Prows, MD;  Location: [facility];  Service: Cardiovascular;  Laterality: N/A;   TEE WITHOUT CARDIOVERSION N/A 08/31/2017   Procedure: TRANSESOPHAGEAL ECHOCARDIOGRAM (TEE);  Surgeon: Adrian Prows, MD;  Location: [facility];  Service: Cardiovascular;  Laterality: N/A;   TEE WITHOUT CARDIOVERSION N/A 01/30/2019   Procedure: TRANSESOPHAGEAL ECHOCARDIOGRAM (TEE);  Surgeon: Sherren Mocha, MD;  Location: [facility];  Service: Open Heart Surgery;  Laterality: N/A;   TRANSCATHETER AORTIC VALVE REPLACEMENT, TRANSFEMORAL N/A 01/30/2019   Procedure: TRANSCATHETER AORTIC VALVE REPLACEMENT, TRANSFEMORAL;  Surgeon: Sherren Mocha, MD;  Location: [facility];  Service: Open Heart Surgery;  Laterality: N/A;   TUBAL LIGATION     Family History  Problem Relation Age of Onset   Pancreatitis Mother    COPD Mother    Cancer - Lung Mother    Diabetes type II Mother    Breast cancer Maternal Grandmother    Cataracts Maternal Grandfather     Social History   Tobacco Use   Smoking status: Never   Smokeless tobacco: Never  Substance Use Topics   Alcohol use: Not Currently   Marital Status: Married   ROS  Review of Systems  Cardiovascular:  Negative for dyspnea  on exertion, leg swelling and syncope.  Gastrointestinal:  Negative for melena.  Objective  Blood pressure 120/77, pulse 70, temperature 97.9 F (36.6 C), temperature source Temporal, resp. rate 17, height 5\' 2"  (1.575 m), weight 223 lb (101.2 kg), SpO2 97 %.  Vitals with BMI 04/16/2021 03/27/2021 12/29/2020  Height 5\' 2"  5\' 2"  5\' 2"   Weight 223 lbs 221 lbs 226 lbs  BMI 40.78 83.41 96.22  Systolic 297 989 211  Diastolic 77 60 64  Pulse 70 69 67     Physical Exam Constitutional:      General: She is not in acute distress.    Appearance: She is well-developed. She is obese.  HENT:     Head: Normocephalic and atraumatic.  Cardiovascular:     Rate and Rhythm: Normal rate and regular  rhythm.     Pulses: Intact distal pulses.          Carotid pulses are  on the right side with bruit and  on the left side with bruit.      Femoral pulses are 1+ on the right side and 1+ on the left side.      Popliteal pulses are 1+ on the right side and 1+ on the left side.       Dorsalis pedis pulses are 2+ on the right side and 2+ on the left side.       Posterior tibial pulses are 0 on the right side and 0 on the left side.     Heart sounds: Murmur heard.  Early systolic murmur is present with a grade of 1/6 at the upper right sternal border.    No gallop.     Comments: No leg edema, no JVD.  Pulmonary:     Effort: Pulmonary effort is normal. No accessory muscle usage or respiratory distress.     Breath sounds: Normal breath sounds.  Abdominal:     General: Bowel sounds are normal.     Palpations: Abdomen is soft.   Laboratory examination:   Recent Labs    04/17/20 1451 09/17/20 1000  NA 130* 136  K 4.4 4.4  CL 98 103  CO2 21* 24  GLUCOSE 255* 105*  BUN 12 20  CREATININE 0.56 0.67  CALCIUM 9.1 9.5  GFRNONAA >60  --    CrCl cannot be calculated (Patient's most recent lab result is older than the maximum 21 days allowed.).  CMP Latest Ref Rng & Units 09/17/2020 04/17/2020 02/03/2020  Glucose 70 - 99 mg/dL 105(H) 255(H) 132(H)  BUN 6 - 23 mg/dL 20 12 13   Creatinine 0.40 - 1.20 mg/dL 0.67 0.56 0.69  Sodium 135 - 145 mEq/L 136 130(L) 137  Potassium 3.5 - 5.1 mEq/L 4.4 4.4 3.8  Chloride 96 - 112 mEq/L 103 98 104  CO2 19 - 32 mEq/L 24 21(L) 23  Calcium 8.4 - 10.5 mg/dL 9.5 9.1 9.2  Total Protein 6.0 - 8.3 g/dL 6.8 7.3 -  Total Bilirubin 0.2 - 1.2 mg/dL 0.5 0.5 -  Alkaline Phos 39 - 117 U/L 69 47 -  AST 0 - 37 U/L 21 37 -  ALT 0 - 35 U/L 19 28 -   CBC Latest Ref Rng & Units 08/29/2020 07/15/2020 04/17/2020  WBC 4.0 - 10.5 K/uL 8.6 14.7(H) 13.7(H)  Hemoglobin 12.0 - 15.0 g/dL 12.3 12.2 12.3  Hematocrit 36.0 - 46.0 % 37.5 37.2 38.2  Platelets 150.0 - 400.0 K/uL 203.0 229.0  188   Lipid Panel Recent Labs  05/14/20 0917 09/17/20 1000  CHOL 164 164  TRIG 178* 110.0  LDLCALC 86 80  VLDL  --  22.0  HDL 48 62.50  CHOLHDL  --  3     HEMOGLOBIN A1C Lab Results  Component Value Date   HGBA1C 7.4 (H) 09/17/2020   MPG 159.94 01/26/2019   TSH Recent Labs    08/29/20 1327  TSH 1.39   Medications and allergies   Allergies  Allergen Reactions   Bee Venom Itching and Swelling   Hydroxyzine Hcl Other (See Comments)   Latex Itching and Swelling   Lovenox [Enoxaparin Sodium] Other (See Comments)    Bruising   Minocycline Nausea And Vomiting   Morphine And Related Nausea And Vomiting   Nickel Other (See Comments)    Contact dermatitis    Shellfish Allergy Itching   Statins     Muscle aches   Sulfa Antibiotics Nausea And Vomiting   Tape Itching    Bandaids and adhesive tape   Cipro [Ciprofloxacin Hcl] Rash   Ciprofloxacin Rash   Glimepiride Rash   Metronidazole Rash    Current Outpatient Medications on File Prior to Visit  Medication Sig Dispense Refill   acetaminophen (TYLENOL) 650 MG CR tablet Take 1,300 mg by mouth every 8 (eight) hours as needed for pain.     aspirin EC 81 MG tablet Take 81 mg by mouth at bedtime.      atenolol (TENORMIN) 25 MG tablet Take 1 tablet (25 mg total) by mouth 2 (two) times daily. 180 tablet 0   clonazePAM (KLONOPIN) 0.5 MG tablet Take 0.5-1 tablets (0.25-0.5 mg total) by mouth 2 (two) times daily as needed. for anxiety 90 tablet 1   clotrimazole-betamethasone (LOTRISONE) cream APPLY TOPICALLY UNDER BREAST TWICE DAILY 30 g 0   esomeprazole (NEXIUM) 40 MG capsule Take 1 capsule (40 mg total) by mouth daily. 90 capsule 2   estradiol (ESTRACE) 0.1 MG/GM vaginal cream Place 1 Applicatorful vaginally as needed (irritation).      fexofenadine (ALLEGRA) 60 MG tablet Take 60 mg by mouth 2 (two) times daily.     FLUoxetine (PROZAC) 40 MG capsule TAKE 1 CAPSULE(40 MG) BY MOUTH DAILY 90 capsule 1   fluticasone (FLONASE) 50  MCG/ACT nasal spray Place 1 spray into both nostrils daily as needed for allergies.      insulin glargine (LANTUS) 100 UNIT/ML injection Inject 10 Units into the skin daily. Sliding scale whil on prednisone     losartan (COZAAR) 25 MG tablet TAKE 1 TABLET(25 MG) BY MOUTH DAILY 90 tablet 2   metFORMIN (GLUCOPHAGE-XR) 500 MG 24 hr tablet TAKE 2 TABLETS(1000 MG) BY MOUTH DAILY 180 tablet 2   Propylene Glycol 0.6 % SOLN Place 1 drop into both eyes daily as needed (dry eyes).     triamcinolone cream (KENALOG) 0.1 % Apply to itchy areas on back twice daily. 30 g 0   No current facility-administered medications on file prior to visit.    Radiology:   Cardiac Studies:   Carotid artery duplex 09-05-17: Minimal stenosis in the bilateral internal carotid artery (1-15%). No significant plaque.   Antegrade right vertebral artery flow. Antegrade left vertebral artery flow. F/U studies if clinically indicated.  No significant change from 02/01/2015.  Lexiscan myoview stress test 09/16/2017: 1. Lexiscan stress test was performed. Exercise capacity was not assessed. Stress symptoms included dyspnea, dizziness and headache. Peak blood pressure 168/90 mmHg. Stress EKG is non diagnostic for ischemia as it is a pharmacologic stress. The stress  electrocardiogram demonstrated sinus tachycardia, normal resting conduction, left ventricular hypertrophy, no resting arrhythmias and normal rest repolarization. 2.  The overall quality of the study is good.  Left ventricular cavity is noted to be normal on the rest and stress studies.  Gated SPECT images reveal normal myocardial thickening and wall motion.  The left ventricular ejection fraction was calculated or visually estimated to be 69%. SPECT images demonstrate predominantly reversible, medium sized, mild intensity perfusion defect. This likely suggests LCx/RPDA territory ischemia.   3. Intermediate risk study.   Right and left heart catheterization  10/04/2017: Hemodynamic data: Right heart catheterization: RA 5/3, mean 1 mmHg, RV 24/0, EDP 4 mmHg.  PA 27/4, mean 50.  PA saturation 64%.  PW 12/16, mean 7 mmHg.  Aortic saturation 98%.  CO 4.67, CI 2.25, preserved.   Left heart catheterization hemodynamic data: LV 164/3, EDP 13 mmHg.  Aortic pressure 105/52, mean 74 mmHg.  Peak to peak pressure gradient of 27 mmHg, mean gradient 22.5 mm and valve area 0.99 cm.   Angiographic data: Normal coronary arteries, right dominant circulation.  PCV ECHOCARDIOGRAM COMPLETE 43/32/9518 Normal LV systolic function with EF 55%. Left ventricle cavity is normal in size. Moderate concentric remodeling of the left ventricle. Normal global wall motion. Doppler evidence of grade II (pseudonormal) diastolic dysfunction, elevated LAP. Calculated EF 55%. No aortic valve regurgitation. No evidence of aortic valve stenosis. S/P TAVR.  Peak velocity  2.47m/s, Peak Gradient 21.3 mmHg, Mean Gradient 9.9 mmHg, AVA 1.5 cm. Moderate posterior mitral valve leaflet calcification. Mildly restricted mitral valve leaflet motion without stenosis. Trace mitral regurgitation. No significant change from 02/29/2020.   EKG  EKG 04/16/2021: Normal sinus rhythm at rate of 65 beats minute, left axis deviation, left anterior fascicular block.  IVCD.  LVH no significant change from 03/10/2020.  Assessment     ICD-10-CM   1. Essential hypertension  I10 EKG 12-Lead    2. S/P TAVR (32mm Edwards Sapien bioprosthetic on 01/30/2019)  Z95.2 PCV ECHOCARDIOGRAM COMPLETE    3. Pure hypercholesterolemia  E78.00 ZYPITAMAG 4 MG TABS    4. Carotid atherosclerosis, bilateral  I65.23 ZYPITAMAG 4 MG TABS       Meds ordered this encounter  Medications   ZYPITAMAG 4 MG TABS    Sig: Take 1 tablet by mouth daily.    Dispense:  90 tablet    Refill:  3    Medications Discontinued During This Encounter  Medication Reason   benzonatate (TESSALON) 200 MG capsule    COVID-19 mRNA bivalent  vaccine, Pfizer, injection    fluticasone furoate-vilanterol (BREO ELLIPTA) 200-25 MCG/ACT AEPB    JANUVIA 100 MG tablet    mirtazapine (REMERON) 7.5 MG tablet    montelukast (SINGULAIR) 10 MG tablet    promethazine-dextromethorphan (PROMETHAZINE-DM) 6.25-15 MG/5ML syrup    Pitavastatin Calcium (LIVALO) 4 MG TABS Reorder    Recommendations:   Kara Lee  is a 75 y.o. 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 She now presents for annual follow up.  During the holidays she has gained weight but otherwise no other specific complaints.  Reviewed her external labs, lipids under control, diabetes is still uncontrolled.  She has not been able to tolerate any other statins, tolerating Livalo, will continue the same for now.  Due to discounts available prescription changed to Zypitamag.  She also has mild carotid atherosclerosis,  fortunately coronary artery were normal during catheterization.  A I will see her back in 1 year with repeat echocardiogram.  Weight loss again discussed with the patient.   Adrian Prows, MD, [facility] 04/16/2021, 3:02 PM Office: [phone] Pager: [phone]

## 2021-04-30 ENCOUNTER — Other Ambulatory Visit: Payer: Self-pay

## 2021-04-30 ENCOUNTER — Ambulatory Visit: Payer: Medicare Other | Admitting: Allergy

## 2021-04-30 ENCOUNTER — Encounter: Payer: Self-pay | Admitting: Allergy

## 2021-04-30 VITALS — BP 114/64 | HR 68 | Temp 97.7°F | Resp 20 | Ht 62.0 in | Wt 223.0 lb

## 2021-04-30 DIAGNOSIS — T63481A Toxic effect of venom of other arthropod, accidental (unintentional), initial encounter: Secondary | ICD-10-CM

## 2021-04-30 DIAGNOSIS — T781XXD Other adverse food reactions, not elsewhere classified, subsequent encounter: Secondary | ICD-10-CM

## 2021-04-30 DIAGNOSIS — J310 Chronic rhinitis: Secondary | ICD-10-CM

## 2021-04-30 DIAGNOSIS — H1013 Acute atopic conjunctivitis, bilateral: Secondary | ICD-10-CM

## 2021-04-30 DIAGNOSIS — L508 Other urticaria: Secondary | ICD-10-CM | POA: Diagnosis not present

## 2021-04-30 DIAGNOSIS — H109 Unspecified conjunctivitis: Secondary | ICD-10-CM

## 2021-04-30 MED ORDER — EPINEPHRINE 0.3 MG/0.3ML IJ SOAJ: 0.3000 mg | INTRAMUSCULAR | 1 refills | Status: DC | PRN

## 2021-04-30 NOTE — Progress Notes (Signed)
New Patient Note  RE: Kara Lee MRN: [id number] DOB: [birth year] Date of Office Visit: 04/30/2021  Referring provider: Shelda Pal* Primary care provider: Shelda Pal, DO  Chief Complaint: itchy skin  History of present illness: Kara Lee is a 76 y.o. female presenting today for consultation for dermatitis.   She reports having itching that she feels around her abdomen, back and underneath her breast.  She states it does bruise.  She states she has been told she had hives.  She states she has had this issue for years (approx 20-30 years).   She states heat will bring on the rash as well stress and pressure.  She has episodes of this rash about once a month or every other month. Episodes can last for about 2 weeks at a time.  The rash can migrate around the body once it start.  The rash is mostly flat.  She states she can see where she scratches after she scratches.  Sometimes the rash can feel like its burning.  No swelling.  No fevers with the rash.  She has joint aches 'all the time' and does see a pain specialist.  She does recall having respiratory illness around Thanksgiving and states the rash did start back up around that time.   She has seen dermatology for this issue last visit last fall.  She has not had a skin biopsy for this rash.  She states 'ive tried everything'.   She has tried different creams like cortison, triamcinolone (states she had a different cream to use under the breast).   She was taking Allegra but this was switched to Claritin which she thinks has been helpful.  She takes the Claritin as needed.  She would take her antihistamine daily when having the rash and it does help with itch.   She uses downy free, all free and clear detergent.  She does not use any deoorderant.  She has not noted any foods that seem to worsen the rash.  She denies any changes with her medications or any medicines that worsen the rash.  She reports she has  history of seasonal allergies with symptoms including sneezing, itchy/watery eyes primarily.  Claritin does help with this.  She also will use Flonase.  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  However she states she can eat a small portion of popcorn shrimp without issue but if she eats a large portion she does get itchy.  Thus she tries to avoid.  She states she can eat fish like flounder without issue.    With bee stings she reports she has developed massive swelling at sting site.   She does have an epipen but states it is very expired (2016).  She reports seeing an allergist many many years ago.    Review of systems in the past 4 weeks: Review of Systems  Constitutional: Negative.   HENT: Negative.    Eyes: Negative.   Respiratory: Negative.    Cardiovascular: Negative.   Gastrointestinal: Negative.   Musculoskeletal: Negative.   Skin: Negative.   Allergic/Immunologic: Negative.   Neurological: Negative.    All other systems negative unless noted above in HPI  Past medical history: Past Medical History:  Diagnosis Date   Anxiety    Complication of anesthesia    Concussion November 09, 2012   From a fall at home on deck   Depression    postpartum depression after 2nd child   Diabetes mellitus ([place])  Fatty liver    GERD (gastroesophageal reflux disease)    HLD (hyperlipidemia)    Hypertension    Morbid obesity ([facility])    OSA on CPAP    uses CPAP nightly   Osteoarthritis of right knee 07/31/2013   PONV (postoperative nausea and vomiting)    Rosacea    Severe aortic stenosis     Past surgical history: Past Surgical History:  Procedure Laterality Date   ABDOMINAL HYSTERECTOMY     still has uterus   ANKLE RECONSTRUCTION Left    ARTERY BIOPSY Left 01/21/2020   Procedure: BIOPSY TEMPORAL ARTERY;  Surgeon: Leta Baptist, MD;  Location: [facility];  Service: ENT;  Laterality: Left;   BIOPSY BREAST Bilateral    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   CESAREAN  SECTION     x 2   CHOLECYSTECTOMY     COLONOSCOPY     EYE SURGERY     lasik; X3 for crossed eyes; 1 eyelid surgery   HERNIA REPAIR     PARTIAL KNEE ARTHROPLASTY Right 07/31/2013   Procedure: RIGHT UNICOMPARTMENTAL KNEE;  Surgeon: Johnny Bridge, MD;  Location: [facility];  Service: Orthopedics;  Laterality: Right;   RIGHT/LEFT HEART CATH AND CORONARY ANGIOGRAPHY N/A 10/04/2017   Procedure: RIGHT/LEFT HEART CATH AND CORONARY ANGIOGRAPHY;  Surgeon: Adrian Prows, MD;  Location: [facility];  Service: Cardiovascular;  Laterality: N/A;   TEE WITHOUT CARDIOVERSION N/A 08/31/2017   Procedure: TRANSESOPHAGEAL ECHOCARDIOGRAM (TEE);  Surgeon: Adrian Prows, MD;  Location: [facility];  Service: Cardiovascular;  Laterality: N/A;   TEE WITHOUT CARDIOVERSION N/A 01/30/2019   Procedure: TRANSESOPHAGEAL ECHOCARDIOGRAM (TEE);  Surgeon: Sherren Mocha, MD;  Location: [facility];  Service: Open Heart Surgery;  Laterality: N/A;   TRANSCATHETER AORTIC VALVE REPLACEMENT, TRANSFEMORAL N/A 01/30/2019   Procedure: TRANSCATHETER AORTIC VALVE REPLACEMENT, TRANSFEMORAL;  Surgeon: Sherren Mocha, MD;  Location: [facility];  Service: Open Heart Surgery;  Laterality: N/A;   TUBAL LIGATION      Family history:  Family History  Problem Relation Age of Onset   Pancreatitis Mother    COPD Mother    Cancer - Lung Mother    Diabetes type II Mother    Allergic rhinitis Maternal Grandmother    Breast cancer Maternal Grandmother    Cataracts Maternal Grandfather    Asthma Son    Immunodeficiency Neg Hx    Angioedema Neg Hx    Eczema Neg Hx    Urticaria Neg Hx     Social history: Lives in a home with carpeting in the bedroom with gas and heat pump heating and central cooling.  No pets in the home.  There is no concern for water damage, mildew or roaches in the home.  She is retired.  She denies a smoking history.   Medication List: Current Outpatient Medications  Medication Sig Dispense Refill    acetaminophen (TYLENOL) 650 MG CR tablet Take 1,300 mg by mouth every 8 (eight) hours as needed for pain.     aspirin EC 81 MG tablet Take 81 mg by mouth at bedtime.      atenolol (TENORMIN) 25 MG tablet Take 1 tablet (25 mg total) by mouth 2 (two) times daily. 180 tablet 0   clonazePAM (KLONOPIN) 0.5 MG tablet Take 0.5-1 tablets (0.25-0.5 mg total) by mouth 2 (two) times daily as needed. for anxiety 90 tablet 1   clotrimazole-betamethasone (LOTRISONE) cream APPLY TOPICALLY UNDER BREAST TWICE DAILY 30 g  0   EPINEPHrine (EPIPEN 2-PAK) 0.3 mg/0.3 mL IJ SOAJ injection Inject 0.3 mg into the muscle as needed for anaphylaxis.     esomeprazole (NEXIUM) 40 MG capsule Take 1 capsule (40 mg total) by mouth daily. 90 capsule 2   estradiol (ESTRACE) 0.1 MG/GM vaginal cream Place 1 Applicatorful vaginally as needed (irritation).      FLUoxetine (PROZAC) 40 MG capsule TAKE 1 CAPSULE(40 MG) BY MOUTH DAILY 90 capsule 1   fluticasone (FLONASE) 50 MCG/ACT nasal spray Place 1 spray into both nostrils daily as needed for allergies.      insulin glargine (LANTUS) 100 UNIT/ML injection Inject 10 Units into the skin daily. Sliding scale whil on prednisone     loratadine (CLARITIN) 10 MG tablet Take 10 mg by mouth daily.     losartan (COZAAR) 25 MG tablet TAKE 1 TABLET(25 MG) BY MOUTH DAILY 90 tablet 2   metFORMIN (GLUCOPHAGE-XR) 500 MG 24 hr tablet TAKE 2 TABLETS(1000 MG) BY MOUTH DAILY 180 tablet 2   Propylene Glycol 0.6 % SOLN Place 1 drop into both eyes daily as needed (dry eyes).     triamcinolone cream (KENALOG) 0.1 % Apply to itchy areas on back twice daily. 30 g 0   ZYPITAMAG 4 MG TABS Take 1 tablet by mouth daily. 90 tablet 3   No current facility-administered medications for this visit.    Known medication allergies: Allergies  Allergen Reactions   Bee Venom Itching and Swelling   Hydroxyzine Hcl Other (See Comments)   Latex Itching and Swelling   Lovenox [Enoxaparin Sodium] Other (See Comments)     Bruising   Minocycline Nausea And Vomiting   Morphine And Related Nausea And Vomiting   Nickel Other (See Comments)    Contact dermatitis    Shellfish Allergy Itching   Statins     Muscle aches   Sulfa Antibiotics Nausea And Vomiting   Tape Itching    Bandaids and adhesive tape   Cipro [Ciprofloxacin Hcl] Rash   Ciprofloxacin Rash   Glimepiride Rash   Metronidazole Rash     Physical examination: Blood pressure 114/64, pulse 68, temperature 97.7 F (36.5 C), temperature source Temporal, resp. rate 20, height 5\' 2"  (1.575 m), weight 223 lb (101.2 kg), SpO2 98 %.  General: Alert, interactive, in no acute distress. HEENT: PERRLA, TMs pearly gray, turbinates non-edematous without discharge, post-pharynx non erythematous. Neck: Supple without lymphadenopathy. Lungs: Clear to auscultation without wheezing, rhonchi or rales. {no increased work of breathing. CV: Normal S1, S2 without murmurs. Abdomen: Nondistended, nontender. Skin: Warm and dry, without lesions or rashes. Extremities:  No clubbing, cyanosis or edema. Neuro:   Grossly intact.  Diagnositics/Labs: Labs: Reviewed labs done by PCP- Component     Latest Ref Rng & Units 05/14/2020 07/15/2020 08/29/2020 09/17/2020  WBC     4.0 - 10.5 K/uL   8.6   RBC     3.87 - 5.11 Mil/uL   4.22   Hemoglobin     12.0 - 15.0 g/dL   12.3   HCT     36.0 - 46.0 %   37.5   MCV     78.0 - 100.0 fl   88.9   MCHC     30.0 - 36.0 g/dL   32.9   RDW     11.5 - 15.5 %   13.8   Platelets     150.0 - 400.0 K/uL   203.0   Neutrophils     43.0 - 77.0 %  59.7   Lymphocytes     12.0 - 46.0 %   28.6   Monocytes Relative     3.0 - 12.0 %   7.4   Eosinophil     0.0 - 5.0 %   3.7   Basophil     0.0 - 3.0 %   0.6   NEUT#     1.4 - 7.7 K/uL   5.2   Lymphocyte #     0.7 - 4.0 K/uL   2.5   Monocyte #     0.1 - 1.0 K/uL   0.6   Eosinophils Absolute     0.0 - 0.7 K/uL   0.3   Basophils Absolute     0.0 - 0.1 K/uL   0.1   Sodium     135 - 145  mEq/L    136  Potassium     3.5 - 5.1 mEq/L    4.4  Chloride     96 - 112 mEq/L    103  CO2     19 - 32 mEq/L    24  Glucose     70 - 99 mg/dL    105 (H)  BUN     6 - 23 mg/dL    20  Creatinine     0.40 - 1.20 mg/dL    0.67  Total Bilirubin     0.2 - 1.2 mg/dL    0.5  Alkaline Phosphatase     39 - 117 U/L    69  AST     0 - 37 U/L    21  ALT     0 - 35 U/L    19  Total Protein     6.0 - 8.3 g/dL    6.8  Albumin     3.5 - 5.2 g/dL    4.2  GFR     >60.00 mL/min    87.24  Calcium     8.4 - 10.5 mg/dL    9.5  Cholesterol, Total     100 - 199 mg/dL 164     Triglycerides     0 - 149 mg/dL 178 (H)     HDL Cholesterol     >39 mg/dL 48     VLDL Cholesterol Cal     5 - 40 mg/dL 30     LDL Chol Calc (NIH)     0 - 99 mg/dL 86     LDL/HDL Ratio     0.0 - 3.2 ratio 1.8     Anti Nuclear Antibody (ANA)     NEGATIVE  NEGATIVE    RA Latex Turbid.     <14 IU/mL  <13    Cyclic Citrullin Peptide Ab     UNITS  <16    Interpretation           Iron     42 - 145 ug/dL  57    Transferrin     212.0 - 360.0 mg/dL  242.0    Saturation Ratios     20.0 - 50.0 %  16.8 (L)    Ferritin     10.0 - 291.0 ng/mL  117.5    Sed Rate     0 - 30 mm/hr  88 (H)    ENA SM Ab Ser-aCnc     <1.0 NEG AI  <1.0 NEG    ds DNA Ab     IU/mL  <1    SSA (Ro) (ENA) Antibody, IgG     <  1.0 NEG AI  <1.0 NEG    SSB (La) (ENA) Antibody, IgG     <1.0 NEG AI  <1.0 NEG    Aldolase     < OR = 8.1 U/L  6.8    VITD     30.00 - 100.00 ng/mL  27.06 (L)    T4,Free(Direct)     0.60 - 1.60 ng/dL   0.73   TSH     0.35 - 4.50 uIU/mL   1.39     Component     Latest Ref Rng & Units 09/17/2020  SIGNAL TO CUT-OFF     <1.00 0.00     Assessment and plan: Chronic urticaria  - at this time etiology of hives and swelling is unknown.  Hives can be caused by a variety of different triggers including illness/infection, foods, medications, stings, exercise, pressure, vibrations, extremes of temperature to name a few  however majority of the time there is no identifiable trigger.  Your symptoms have been ongoing for >6 weeks making this chronic thus will obtain labwork to evaluate:  tryptase, hive panel, environmental panel, alpha-gal panel - for hive control recommend taking high-dose antihistamine therapy: Claritin 1 tab 1-2 times a day with Pepcid 1 tab 1-2 times a day - if antihistamine regimen is not effective enough then consider adding Singulair - if triple therapy regimen is not effective enough then consider Xolair monthly injections for better control.  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  We will discuss this further in more detail if needed  Adverse food reaction Hymenoptera allergy - continue avoidance of shellfish and stinging insects - have access to self-injectable epinephrine (Epipen or AuviQ) 0.3mg  at all times - follow emergency action plan in case of allergic reaction  Rhinoconjunctivitis - we will obtain around allergy panel as above. - you can use the Xyzal for your allergy symptom control - for itchy watery eyes she can use Pataday 1 drop each eye daily as needed.  Uses over-the-counter  Follow-up in 2 to 3 months or sooner if needed    I appreciate the opportunity to take part in Kara Lee's care. Please do not hesitate to contact me with questions.  Sincerely,   Prudy Feeler, MD Allergy/Immunology [facility] [place] [facility]

## 2021-04-30 NOTE — Patient Instructions (Addendum)
°-   at this time etiology of hives and swelling is unknown.  Hives can be caused by a variety of different triggers including illness/infection, foods, medications, stings, exercise, pressure, vibrations, extremes of temperature to name a few however majority of the time there is no identifiable trigger.  Your symptoms have been ongoing for >6 weeks making this chronic thus will obtain labwork to evaluate:  tryptase, hive panel, environmental panel, alpha-gal panel - for hive control recommend taking high-dose antihistamine therapy: Claritin 1 tab 1-2 times a day with Pepcid 1 tab 1-2 times a day - if antihistamine regimen is not effective enough then consider adding Singulair - if triple therapy regimen is not effective enough then consider Xolair monthly injections for better control.  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  We will discuss this further in more detail if needed  - continue avoidance of shellfish and stinging insects - have access to self-injectable epinephrine (Epipen or AuviQ) 0.3mg  at all times - follow emergency action plan in case of allergic reaction  - we will obtain around allergy panel as above. - you can use the Xyzal for your allergy symptom control - for itchy watery eyes she can use Pataday 1 drop each eye daily as needed.  Uses over-the-counter  Follow-up in 2 to 3 months or sooner if needed

## 2021-05-02 ENCOUNTER — Encounter: Payer: Self-pay | Admitting: Allergy

## 2021-05-04 ENCOUNTER — Other Ambulatory Visit: Payer: Self-pay | Admitting: Family

## 2021-05-05 ENCOUNTER — Other Ambulatory Visit: Payer: Self-pay

## 2021-05-05 MED ORDER — MONTELUKAST SODIUM 10 MG PO TABS: 10.0000 mg | ORAL_TABLET | Freq: Every day | ORAL | 5 refills | Status: DC

## 2021-05-08 LAB — ALLERGENS W/TOTAL IGE AREA 2
Alternaria Alternata IgE: <0.1 kU/L
Aspergillus Fumigatus IgE: <0.1 kU/L
Bermuda Grass IgE: <0.1 kU/L
Cat Dander IgE: <0.1 kU/L
Cedar, Mountain IgE: 0.32 kU/L — AB
Cladosporium Herbarum IgE: <0.1 kU/L
Cockroach, German IgE: <0.1 kU/L
Common Silver Birch IgE: 1.04 kU/L — AB
Cottonwood IgE: <0.1 kU/L
D Farinae IgE: <0.1 kU/L
D Pteronyssinus IgE: <0.1 kU/L
Dog Dander IgE: <0.1 kU/L
Elm, American IgE: 0.13 kU/L — AB
Johnson Grass IgE: <0.1 kU/L
Maple/Box Elder IgE: 0.11 kU/L — AB
Mouse Urine IgE: <0.1 kU/L
Oak, White IgE: 0.42 kU/L — AB
Pecan, Hickory IgE: <0.1 kU/L
Penicillium Chrysogen IgE: <0.1 kU/L
Pigweed, Rough IgE: <0.1 kU/L
Ragweed, Short IgE: 0.11 kU/L — AB
Sheep Sorrel IgE Qn: <0.1 kU/L
Timothy Grass IgE: 0.11 kU/L — AB
White Mulberry IgE: <0.1 kU/L

## 2021-05-08 LAB — THYROID ANTIBODIES
Thyroglobulin Antibody: <1 IU/mL (ref 0.0–0.9)
Thyroperoxidase Ab SerPl-aCnc: <9 IU/mL (ref 0–34)

## 2021-05-08 LAB — TRYPTASE: Tryptase: 8.2 ug/L (ref 2.2–13.2)

## 2021-05-08 LAB — ALPHA-GAL PANEL
Allergen Lamb IgE: 0.19 kU/L — AB
Beef IgE: 0.2 kU/L — AB
IgE (Immunoglobulin E), Serum: 130 IU/mL (ref 6–495)
O215-IgE Alpha-Gal: <0.1 kU/L
Pork IgE: <0.1 kU/L

## 2021-05-08 LAB — CHRONIC URTICARIA: cu index: 11.6 — ABNORMAL HIGH (ref <10)

## 2021-05-13 ENCOUNTER — Other Ambulatory Visit: Payer: Self-pay | Admitting: Family Medicine

## 2021-05-22 ENCOUNTER — Other Ambulatory Visit: Payer: Self-pay

## 2021-05-22 ENCOUNTER — Encounter: Payer: Self-pay | Admitting: Allergy

## 2021-05-22 MED ORDER — HYDROXYZINE HCL 25 MG PO TABS: 25.0000 mg | ORAL_TABLET | Freq: Every evening | ORAL | 0 refills | Status: DC | PRN

## 2021-05-25 ENCOUNTER — Other Ambulatory Visit: Payer: Self-pay | Admitting: *Deleted

## 2021-05-25 MED ORDER — HYDROXYZINE HCL 25 MG PO TABS: 25.0000 mg | ORAL_TABLET | Freq: Every evening | ORAL | 0 refills | Status: DC | PRN

## 2021-05-27 ENCOUNTER — Encounter: Payer: Self-pay | Admitting: Family Medicine

## 2021-05-27 ENCOUNTER — Other Ambulatory Visit (HOSPITAL_BASED_OUTPATIENT_CLINIC_OR_DEPARTMENT_OTHER): Payer: Self-pay

## 2021-05-27 ENCOUNTER — Ambulatory Visit (INDEPENDENT_AMBULATORY_CARE_PROVIDER_SITE_OTHER): Payer: Medicare Other | Admitting: Family Medicine

## 2021-05-27 VITALS — BP 120/76 | HR 74 | Temp 97.9°F | Ht 62.0 in | Wt 227.4 lb

## 2021-05-27 DIAGNOSIS — L508 Other urticaria: Secondary | ICD-10-CM | POA: Diagnosis not present

## 2021-05-27 MED ORDER — SHINGRIX 50 MCG/0.5ML IM SUSR
INTRAMUSCULAR | 1 refills | Status: DC
  Filled 2021-05-27: qty 1, 30d supply, fill #0

## 2021-05-27 NOTE — Patient Instructions (Addendum)
It would be unlikely that you have a true allergy to hydroxyzine.   Hold off on the montelukast until you see your allergist again. It is a safe medication. Stay with the Zyrtec and hydroxyzine.   Let us know if you need anything.

## 2021-05-27 NOTE — Progress Notes (Signed)
Chief Complaint  Patient presents with   Follow-up    Discuss medication put on allergy list.    Subjective: Patient is a 76 y.o. female here for discussion of allergies.  Had some allergy testing. Told to take Zyrtec 10 mg in AM and hydroxyzine in evening.  She reports having hydroxyzine in the past and does not remember having allergic reaction despite it being on her list.  She would like it removed.  She has been on Benadryl in the past without any issues.  She was also given montelukast in the past but does not remember why she never took it.  She read that it is associated with blood clots and is wondering if it is safe to take.  She also wonders if she should take it with her Zyrtec and hydroxyzine.  Past Medical History:  Diagnosis Date   Anxiety    Complication of anesthesia    Concussion November 09, 2012   From a fall at home on deck   Depression    postpartum depression after 2nd child   Diabetes mellitus ([place])    Fatty liver    GERD (gastroesophageal reflux disease)    HLD (hyperlipidemia)    Hypertension    Morbid obesity ([facility])    OSA on CPAP    uses CPAP nightly   Osteoarthritis of right knee 07/31/2013   PONV (postoperative nausea and vomiting)    Rosacea    Severe aortic stenosis     Objective: BP 120/76    Pulse 74    Temp 97.9 F (36.6 C) (Oral)    Ht 5\' 2"  (1.575 m)    Wt 227 lb 6 oz (103.1 kg)    SpO2 97%    BMI 41.59 kg/m  General: Awake, appears stated age Heart: RRR, no LE edema Lungs: CTAB, no rales, wheezes or rhonchi. No accessory muscle use Psych: Age appropriate judgment and insight, normal affect and mood  Assessment and Plan: Chronic urticaria  Would recommend taking hydroxyzine at night and Zyrtec in the morning as her allergist recommended.  We will remove medication from allergy list as this was questionable.  Do not take montelukast at this time, bring it up at follow-up with her allergist.  All questions answered.  Follow-up as originally  scheduled. The patient voiced understanding and agreement to the plan.  I spent 20 minutes with the patient discussing the above plan in addition to reviewing her office visit note from Dr. Nelva Lee on the same day of the visit.  [place], DO 05/27/21  4:49 PM

## 2021-05-30 ENCOUNTER — Encounter: Payer: Self-pay | Admitting: Cardiology

## 2021-06-01 NOTE — Telephone Encounter (Signed)
From patient.

## 2021-06-17 ENCOUNTER — Other Ambulatory Visit: Payer: Self-pay

## 2021-06-17 ENCOUNTER — Encounter: Payer: Self-pay | Admitting: Allergy

## 2021-06-17 ENCOUNTER — Ambulatory Visit: Payer: Medicare Other | Admitting: Allergy

## 2021-06-17 VITALS — BP 126/68 | HR 64 | Temp 97.7°F | Resp 20

## 2021-06-17 DIAGNOSIS — T781XXD Other adverse food reactions, not elsewhere classified, subsequent encounter: Secondary | ICD-10-CM

## 2021-06-17 DIAGNOSIS — T63481A Toxic effect of venom of other arthropod, accidental (unintentional), initial encounter: Secondary | ICD-10-CM

## 2021-06-17 DIAGNOSIS — L508 Other urticaria: Secondary | ICD-10-CM | POA: Diagnosis not present

## 2021-06-17 DIAGNOSIS — J301 Allergic rhinitis due to pollen: Secondary | ICD-10-CM

## 2021-06-17 DIAGNOSIS — G4733 Obstructive sleep apnea (adult) (pediatric): Secondary | ICD-10-CM | POA: Diagnosis not present

## 2021-06-17 NOTE — Progress Notes (Signed)
Follow-up Note  RE: Kara Lee MRN: [id number] DOB: 1946/03/10 Date of Office Visit: 06/17/2021   History of present illness: Kara Lee is a 76 y.o. female presenting today for follow-up of urticaria, rhinoconjunctivitis, and adverse food reactions and hymenoptera allergy.  She was last seen in the office on 04/30/2021 by myself.  She states she is doing better and that the medications have helped.  She is currently doing Zyrtec in the morning with Pepcid and taking Singulair at night.  However she is not entirely sure what medications she is taking as she states she is taking a lot and something makes her sleepy.  She also has a prescription for hydroxyzine and states maybe she is taking this at night.  Her nighttime medicine she states is a green pill.  Even though she states this regimen is helpful she still has a problem with hives about once a week. She states yesterday her husband mow the grass and she developed a bit of a cough with some drainage.  She did use a nasal spray, Flonase, as well as Robitussin.  This seemed to help the cough symptoms. With the pollen season she also is noting some increase in nasal congestion. She continues to avoid shellfish and sting insects.  She has access to an epinephrine device.  Review of systems in the past 4 weeks: Review of Systems  Constitutional: Negative.   HENT:  Positive for congestion.   Eyes: Negative.   Respiratory:  Positive for cough.   Cardiovascular: Negative.   Gastrointestinal: Negative.   Musculoskeletal: Negative.   Skin:  Positive for rash.  Allergic/Immunologic: Negative.   Neurological: Negative.     All other systems negative unless noted above in HPI  Past medical/social/surgical/family history have been reviewed and are unchanged unless specifically indicated below.  No changes  Medication List: Current Outpatient Medications  Medication Sig Dispense Refill   acetaminophen (TYLENOL) 650 MG CR tablet  Take 1,300 mg by mouth every 8 (eight) hours as needed for pain.     aspirin EC 81 MG tablet Take 81 mg by mouth at bedtime.      atenolol (TENORMIN) 25 MG tablet Take 1 tablet (25 mg total) by mouth 2 (two) times daily. 180 tablet 0   cetirizine (ZYRTEC) 10 MG tablet Take 10 mg by mouth daily.     clonazePAM (KLONOPIN) 0.5 MG tablet Take 0.5-1 tablets (0.25-0.5 mg total) by mouth 2 (two) times daily as needed. for anxiety 90 tablet 1   clotrimazole-betamethasone (LOTRISONE) cream APPLY TOPICALLY UNDER BREAST TWICE DAILY 30 g 0   EPINEPHrine (EPIPEN 2-PAK) 0.3 mg/0.3 mL IJ SOAJ injection Inject 0.3 mg into the muscle as needed for anaphylaxis. 2 each 1   estradiol (ESTRACE) 0.1 MG/GM vaginal cream Place 1 Applicatorful vaginally as needed (irritation).      famotidine (PEPCID) 40 MG tablet Take 40 mg by mouth daily.     FLUoxetine (PROZAC) 40 MG capsule TAKE 1 CAPSULE(40 MG) BY MOUTH DAILY 90 capsule 1   hydrOXYzine (ATARAX) 25 MG tablet Take 1 tablet (25 mg total) by mouth at bedtime as needed. 90 tablet 0   insulin glargine (LANTUS) 100 UNIT/ML injection Inject 10 Units into the skin daily. Sliding scale whil on prednisone     losartan (COZAAR) 25 MG tablet TAKE 1 TABLET(25 MG) BY MOUTH DAILY 90 tablet 2   metFORMIN (GLUCOPHAGE-XR) 500 MG 24 hr tablet TAKE 2 TABLETS(1000 MG) BY MOUTH DAILY (Patient taking differently: 500  mg in the morning and at bedtime.) 180 tablet 2   METHOCARBAMOL PO Take by mouth as needed (for muscle spasms).     montelukast (SINGULAIR) 10 MG tablet Take 1 tablet (10 mg total) by mouth at bedtime. 30 tablet 5   Propylene Glycol 0.6 % SOLN Place 1 drop into both eyes daily as needed (dry eyes).     triamcinolone cream (KENALOG) 0.1 % Apply to itchy areas on back twice daily. 30 g 0   ZYPITAMAG 4 MG TABS Take 1 tablet by mouth daily. (Patient taking differently: Take 1 tablet by mouth daily. For cholesterol) 90 tablet 3   esomeprazole (NEXIUM) 40 MG capsule Take 1 capsule  (40 mg total) by mouth daily. (Patient not taking: Reported on 06/17/2021) 90 capsule 2   fluticasone (FLONASE) 50 MCG/ACT nasal spray Place 1 spray into both nostrils daily as needed for allergies.  (Patient not taking: Reported on 06/17/2021)     loratadine (CLARITIN) 10 MG tablet Take 10 mg by mouth daily. (Patient not taking: Reported on 06/17/2021)     No current facility-administered medications for this visit.     Known medication allergies: Allergies  Allergen Reactions   Bee Venom Itching and Swelling   Latex Itching and Swelling   Lovenox [Enoxaparin Sodium] Other (See Comments)    Bruising   Minocycline Nausea And Vomiting   Morphine And Related Nausea And Vomiting   Nickel Other (See Comments)    Contact dermatitis    Shellfish Allergy Itching   Statins     Muscle aches   Sulfa Antibiotics Nausea And Vomiting   Tape Itching    Bandaids and adhesive tape   Cipro [Ciprofloxacin Hcl] Rash   Ciprofloxacin Rash   Glimepiride Rash   Metronidazole Rash     Physical examination: Blood pressure 126/68, pulse 64, temperature 97.7 F (36.5 C), temperature source Temporal, resp. rate 20, SpO2 98 %.  General: Alert, interactive, in no acute distress. HEENT: PERRLA, TMs pearly gray, turbinates minimally edematous without discharge, post-pharynx non erythematous. Neck: Supple without lymphadenopathy. Lungs: Clear to auscultation without wheezing, rhonchi or rales. {no increased work of breathing. CV: Normal S1, S2 without murmurs. Abdomen: Nondistended, nontender. Skin: Warm and dry, without lesions or rashes. Extremities:  No clubbing, cyanosis or edema. Neuro:   Grossly intact.  Diagnositics/Labs: Labs:  Component     Latest Ref Rng & Units 04/30/2021  D Pteronyssinus IgE     Class 0 kU/L <0.10  D Farinae IgE     Class 0 kU/L <0.10  Cat Dander IgE     Class 0 kU/L <0.10  Dog Dander IgE     Class 0 kU/L <0.10  [place] Grass IgE     Class 0 kU/L <0.10  Timothy Grass  IgE     Class 0/I kU/L 0.11 (A)  Johnson Grass IgE     Class 0 kU/L <0.10  Cockroach, German IgE     Class 0 kU/L <0.10  Penicillium Chrysogen IgE     Class 0 kU/L <0.10  Cladosporium Herbarum IgE     Class 0 kU/L <0.10  Aspergillus Fumigatus IgE     Class 0 kU/L <0.10  Alternaria Alternata IgE     Class 0 kU/L <0.10  Maple/Box Elder IgE     Class 0/I kU/L 0.11 (A)  Common Silver Wendee Copp IgE     Class II kU/L 1.04 (A)  Cedar, Mountain IgE     Class I kU/L 0.32 (A)  Oak,  White IgE     Class I kU/L 0.42 (A)  Elm, American IgE     Class 0/I kU/L 0.13 (A)  Cottonwood IgE     Class 0 kU/L <0.10  Pecan, Hickory IgE     Class 0 kU/L <0.10  White Mulberry IgE     Class 0 kU/L <0.10  Ragweed, Short IgE     Class 0/I kU/L 0.11 (A)  Pigweed, Rough IgE     Class 0 kU/L <0.10  Sheep Sorrel IgE Qn     Class 0 kU/L <0.10  Mouse Urine IgE     Class 0 kU/L <0.10  Class Description Allergens      Comment  IgE (Immunoglobulin E), Serum     6 - 495 IU/mL 130  [id number] Alpha-Gal     Class 0 kU/L <0.10  Beef IgE     Class 0/I kU/L 0.20 (A)  Pork IgE     Class 0 kU/L <0.10  Allergen Lamb IgE     Class 0/I kU/L 0.19 (A)  Thyroperoxidase Ab SerPl-aCnc     0 - 34 IU/mL <9  Thyroglobulin Antibody     0.0 - 0.9 IU/mL <1.0  Tryptase     2.2 - 13.2 ug/L 8.2  cu index     <10 11.6 (H)   Assessment and plan: Chronic urticaria Allergic rhinitis Adverse food reaction Hymenoptera allergy   - at this time etiology of hives and swelling is autoimmune in nature as you do have been elevated chronic high index.  This indicates that you do make an antibody that can target her allergy cells to drive recurrent and chronic hives.  Your labs also indicate that you have low IgE levels to beef and lamb but alpha-gal IgE is negative.  If you note any symptoms surrounding beef and lamb ingestion then they will avoid otherwise if these are food you eat in her diet without issue then you can continue to  eat these in the diet. - hives can be caused by a variety of different triggers including illness/infection, foods, medications, stings, exercise, pressure, vibrations, extremes of temperature to name a few however majority of the time there is no identifiable trigger.   - for hive control continue taking high-dose antihistamine therapy: Zyrtec 1 tab 1-2 times a day with Pepcid 1 tab 1-2 times a day and Singulair 10mg  daily - reserve Hydroxyzine for as needed use for breakthrough hives - at this time consider starting Xolair monthly injections for better control.  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  We will have Tammy, our biologic nurse coordinator, call you in regards to [place] and approval process  - environmental allergy panel shows detectable IgE to grass pollen, tree pollen and weed pollen.  Continue avoidance measures for these allergens. - Zyrtec and Singulair as above should help with allergy symptom control - use Flonase2 sprays each nostril daily for 1-2 weeks at a time before stopping once nasal congestion improves for maximum benefit - for itchy watery eyes she can use Pataday 1 drop each eye daily as needed.  Uses over-the-counter  - continue avoidance of shellfish and stinging insects - have access to self-injectable epinephrine (Epipen or AuviQ) 0.3mg  at all times - follow emergency action plan in case of allergic reaction  Follow-up in 2 to 3 months or sooner if needed    I appreciate the opportunity to take part in Mckala's care. Please do not hesitate to contact me with questions.  Sincerely,  Prudy Feeler, MD Allergy/Immunology [facility]

## 2021-06-17 NOTE — Patient Instructions (Addendum)
Updated visit summary ? ?- at this time etiology of hives and swelling is autoimmune in nature as you do have been elevated chronic high index.  This indicates that you do make an antibody that can target her allergy cells to drive recurrent and chronic hives.  Your labs also indicate that you have low IgE levels to beef and lamb but alpha-gal IgE is negative.  If you note any symptoms surrounding beef and lamb ingestion then they will avoid otherwise if these are food you eat in her diet without issue then you can continue to eat these in the diet. ?- hives can be caused by a variety of different triggers including illness/infection, foods, medications, stings, exercise, pressure, vibrations, extremes of temperature to name a few however majority of the time there is no identifiable trigger.   ?- for hive control continue taking high-dose antihistamine therapy: Zyrtec 1 tab 1-2 times a day with Pepcid 1 tab 1-2 times a day and Singulair 10mg  daily ?- reserve Hydroxyzine for as needed use for breakthrough hives ?- at this time consider starting Xolair monthly injections for better control.  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  We will have Tammy, our biologic nurse coordinator, call you in regards to [place] and approval process ? ?- continue avoidance of shellfish and stinging insects ?- have access to self-injectable epinephrine (Epipen or AuviQ) 0.3mg  at all times ?- follow emergency action plan in case of allergic reaction ? ?- environmental allergy panel shows detectable IgE to grass pollen, tree pollen and weed pollen.  Continue avoidance measures for these allergens. ?- Zyrtec and Singulair as above should help with allergy symptom control ?- use Flonase2 sprays each nostril daily for 1-2 weeks at a time before stopping once nasal congestion improves for maximum benefit ?- for itchy watery eyes she can use Pataday 1 drop each eye daily as needed.  Uses over-the-counter ? ?Follow-up in 2 to 3  months or sooner if needed ?

## 2021-06-22 ENCOUNTER — Telehealth: Payer: Self-pay | Admitting: *Deleted

## 2021-06-22 NOTE — Telephone Encounter (Signed)
-----   Message from [place], MD sent at 06/17/2021  1:45 PM EST ----- ?Patient has hives and is interested in [place].  However she would like to know roughly what the cost might be.  She is not sure if it would be covered ?

## 2021-06-22 NOTE — Telephone Encounter (Signed)
Called patient and advised patient assistance for Xolair for [facility] patient due to high copays. Will mail app to patient and be in [place] for her to start once approved and delivery set up ?

## 2021-06-22 NOTE — Telephone Encounter (Signed)
-----   Message from [place], MD sent at 06/17/2021  1:45 PM EST ----- ?Patient has hives and is interested in [place].  However she would like to know roughly what the cost might be.  She is not sure if it would be covered ?

## 2021-06-24 NOTE — Progress Notes (Signed)
Spoke to patient and advised Xolair through patient assistance and consent mailed to patient. Will be in touch and advise patient once approved for free drug due to her MCR ins ?

## 2021-06-28 ENCOUNTER — Encounter: Payer: Self-pay | Admitting: Allergy

## 2021-06-30 ENCOUNTER — Other Ambulatory Visit: Payer: Self-pay | Admitting: Family Medicine

## 2021-06-30 ENCOUNTER — Encounter: Payer: Self-pay | Admitting: Family Medicine

## 2021-06-30 DIAGNOSIS — Z1211 Encounter for screening for malignant neoplasm of colon: Secondary | ICD-10-CM

## 2021-07-01 ENCOUNTER — Ambulatory Visit: Payer: Medicare Other | Admitting: Allergy

## 2021-07-02 ENCOUNTER — Ambulatory Visit: Payer: Medicare Other | Admitting: Medical

## 2021-07-03 ENCOUNTER — Encounter: Payer: Self-pay | Admitting: Family Medicine

## 2021-07-03 ENCOUNTER — Ambulatory Visit: Payer: Medicare Other | Admitting: Family Medicine

## 2021-07-03 VITALS — BP 110/76 | HR 72 | Temp 97.9°F | Ht 62.0 in | Wt 224.0 lb

## 2021-07-03 DIAGNOSIS — E669 Obesity, unspecified: Secondary | ICD-10-CM

## 2021-07-03 DIAGNOSIS — F321 Major depressive disorder, single episode, moderate: Secondary | ICD-10-CM | POA: Diagnosis not present

## 2021-07-03 DIAGNOSIS — E1169 Type 2 diabetes mellitus with other specified complication: Secondary | ICD-10-CM | POA: Diagnosis not present

## 2021-07-03 DIAGNOSIS — J029 Acute pharyngitis, unspecified: Secondary | ICD-10-CM

## 2021-07-03 LAB — POCT RAPID STREP A (OFFICE): Rapid Strep A Screen: NEGATIVE

## 2021-07-03 MED ORDER — METHYLPREDNISOLONE ACETATE 80 MG/ML IJ SUSP
80.0000 mg | Freq: Once | INTRAMUSCULAR | Status: AC
  Administered 2021-07-03: 80 mg via INTRAMUSCULAR

## 2021-07-03 NOTE — Addendum Note (Signed)
Addended by: Sharon Seller B on: 07/03/2021 01:06 PM ? ? Modules accepted: Orders ? ?

## 2021-07-03 NOTE — Progress Notes (Signed)
SUBJECTIVE:   ?Kara Lee is a 76 y.o. female presents to the clinic for:  ?Chief Complaint  ?Patient presents with  ? Sore Throat  ?  Left side of throat painful and down to ear. ?  ? ? ?Complains of sore throat for 3 days.  ?Other associated symptoms: subjective fever, sinus congestion, rhinorrhea, ear pain, and sore throat.  ?Denies: sinus pain, itchy watery eyes, ear drainage, wheezing, shortness of breath, myalgia, and coughing ?Sick Contacts:  spouse had URI s/s's but no sore throat ?Therapy to date:  Tylenol, Zyrtec ?Tested neg for covid ? ?Social History  ? ?Tobacco Use  ?Smoking Status Never  ?Smokeless Tobacco Never  ? ? ?Patient's medications, allergies, past medical, surgical, social and family histories were reviewed and updated as appropriate. ? ?OBJECTIVE:  ?BP 110/76   Pulse 72   Temp 97.9 ?F (36.6 ?C) (Oral)   Ht '5\' 2"'$  (1.575 m)   Wt 224 lb (101.6 kg)   SpO2 98%   BMI 40.97 kg/m?  ?General: Awake, alert, appearing stated age ?Eyes: conjunctivae and sclerae clear ?Ears: normal TMs bilaterally ?Nose: no visible exudate ?Oropharynx: lips, mucosa, and tongue normal; teeth and gums normal and pharynx erythematous and without exudate.  ?Neck: supple, no significant adenopathy ?Lungs: clear to auscultation, no wheezes, rales or rhonchi, symmetric air entry, normal effort ?Heart: RRR ?Skin:reveals no rash ?Psych: Age appropriate judgment and insight ? ?ASSESSMENT/PLAN:  ?Sore throat ? ?Rapid strep neg. Steroid injection today. Continue to practice good hand hygiene and push fluids. ?Salt water gargles, air humidifier, throat lozenges, and acetaminophen for pain. ?F/u prn. ?Pt voiced understanding and agreement to the plan. ? ?Shelda Pal, DO ?07/03/21 ?12:50 PM ?

## 2021-07-03 NOTE — Patient Instructions (Addendum)
Consider throat lozenges, salt water gargles and an air humidifier for symptomatic care.   OK to take Tylenol 1000 mg (2 extra strength tabs) or 975 mg (3 regular strength tabs) every 6 hours as needed.  Let us know if you need anything.  

## 2021-07-05 ENCOUNTER — Encounter: Payer: Self-pay | Admitting: Family Medicine

## 2021-07-06 ENCOUNTER — Other Ambulatory Visit: Payer: Self-pay | Admitting: Family Medicine

## 2021-07-06 MED ORDER — AZITHROMYCIN 250 MG PO TABS: ORAL_TABLET | ORAL | 0 refills | Status: DC

## 2021-07-07 ENCOUNTER — Encounter: Payer: Self-pay | Admitting: Family Medicine

## 2021-07-14 ENCOUNTER — Other Ambulatory Visit: Payer: Self-pay | Admitting: Cardiology

## 2021-07-15 ENCOUNTER — Encounter: Payer: Self-pay | Admitting: Family Medicine

## 2021-07-15 ENCOUNTER — Other Ambulatory Visit: Payer: Self-pay | Admitting: Family Medicine

## 2021-07-15 DIAGNOSIS — G4733 Obstructive sleep apnea (adult) (pediatric): Secondary | ICD-10-CM

## 2021-07-15 DIAGNOSIS — Z1211 Encounter for screening for malignant neoplasm of colon: Secondary | ICD-10-CM

## 2021-07-15 NOTE — Addendum Note (Signed)
Addended by: Sharon Seller B on: 07/15/2021 04:33 PM ? ? Modules accepted: Orders ? ?

## 2021-07-18 ENCOUNTER — Other Ambulatory Visit: Payer: Self-pay | Admitting: Family Medicine

## 2021-07-18 DIAGNOSIS — G4733 Obstructive sleep apnea (adult) (pediatric): Secondary | ICD-10-CM | POA: Diagnosis not present

## 2021-07-21 ENCOUNTER — Other Ambulatory Visit: Payer: Self-pay | Admitting: Family Medicine

## 2021-07-21 MED ORDER — ESOMEPRAZOLE MAGNESIUM 40 MG PO CPDR: DELAYED_RELEASE_CAPSULE | ORAL | 3 refills | Status: DC

## 2021-07-24 IMAGING — CT CT ANGIO CHEST
2 of 16 series · 15 of 37 positions shown · IV contrast (omnipaque)
Comparison: None.

CLINICAL DATA: 72-year-old female with history of severe aortic
stenosis. Preprocedural study prior to potential transcatheter
aortic valve replacement (TAVR) procedure.

EXAM:
CT ANGIOGRAPHY CHEST, ABDOMEN AND PELVIS
TECHNIQUE: Multidetector CT imaging through the chest, abdomen and pelvis was
performed using the standard protocol during bolus administration of
intravenous contrast. Multiplanar reconstructed images and MIPs were
obtained and reviewed to evaluate the vascular anatomy.
CONTRAST:  100mL OMNIPAQUE IOHEXOL 350 MG/ML SOLN

[Series 11: 0-90% · axial · 0.39mm/px · z∈[-438,-292]mm · 7 of 3240 slices shown]
[im 405/3240  lung]
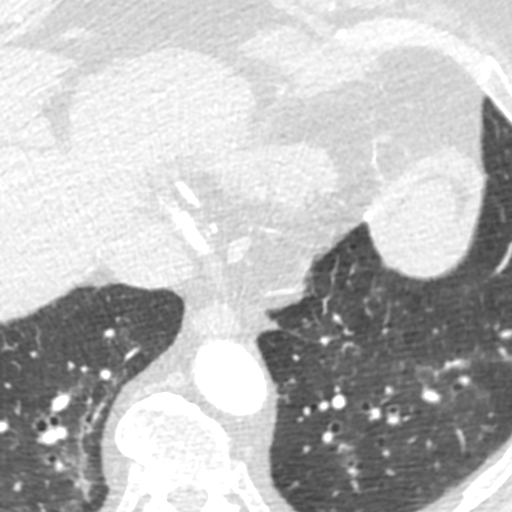
[im 810/3240  lung]
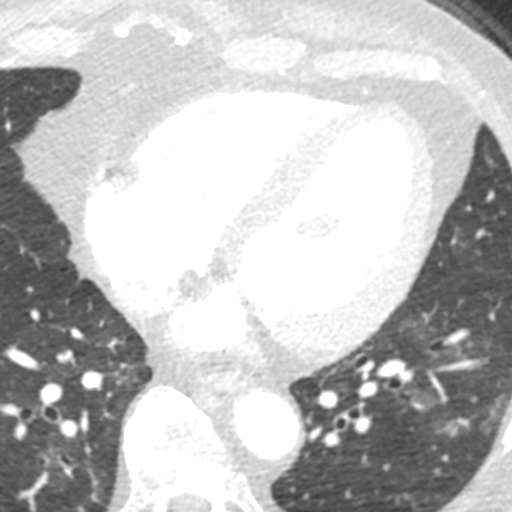
[im 1215/3240  lung]
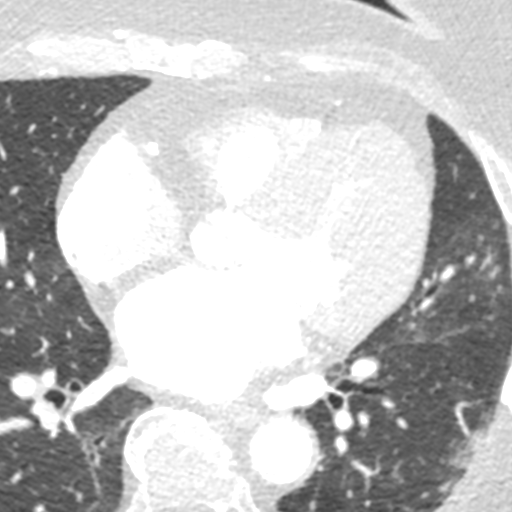
[im 1620/3240  lung]
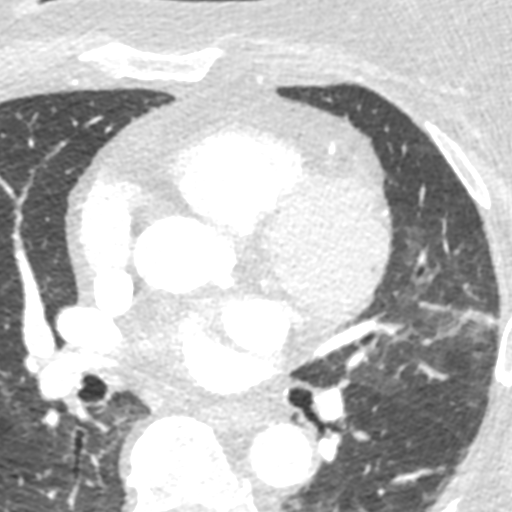
[im 2025/3240  lung]
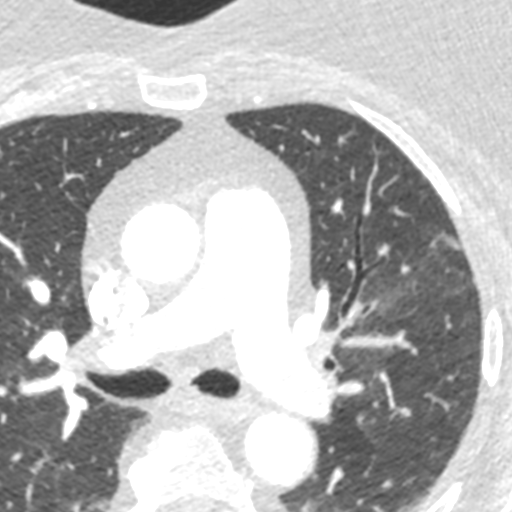
[im 2430/3240  lung]
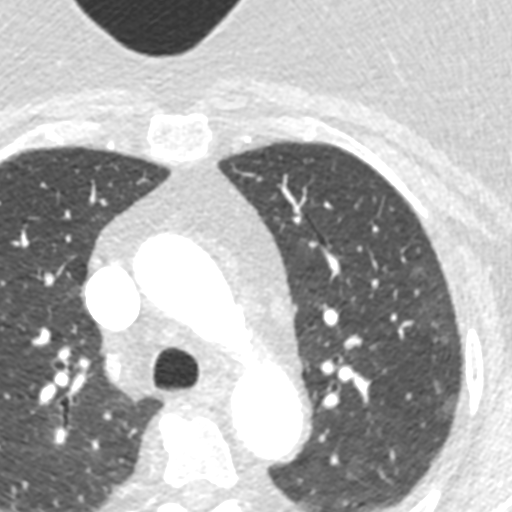
[im 2835/3240  lung]
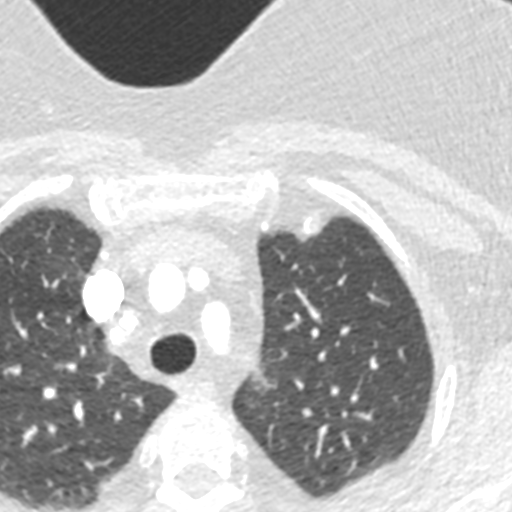

[Series 12: 5-95% · axial · 0.39mm/px · z∈[-441,-290]mm · 8 of 3240 slices shown]
[im 360/3240  lung]
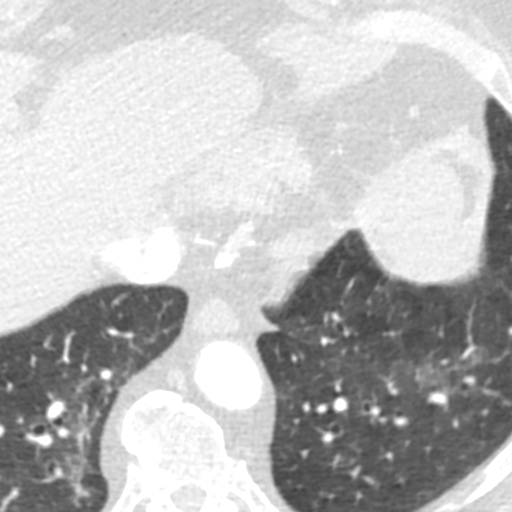
[im 720/3240  mediastinal]
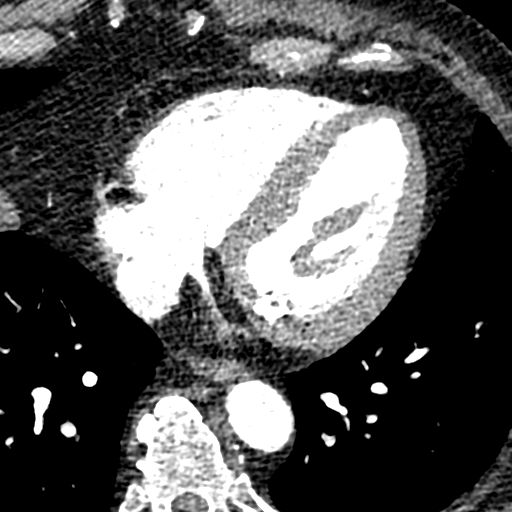
[im 1080/3240  lung]
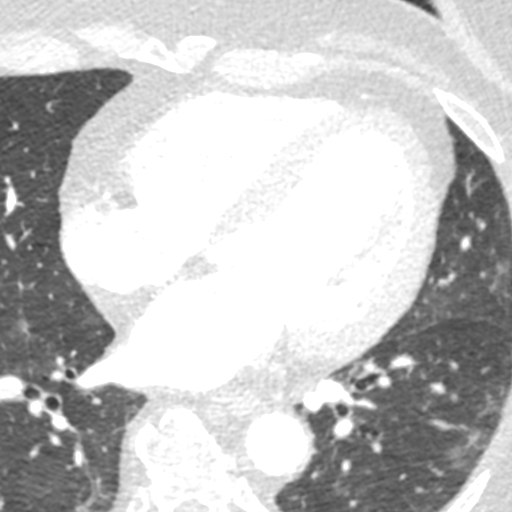
[im 1440/3240  mediastinal]
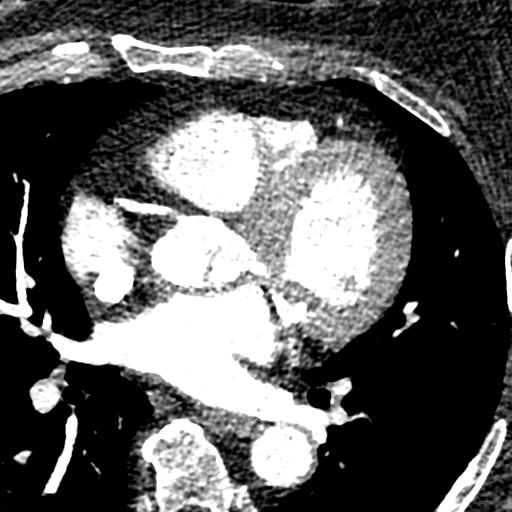
[im 1800/3240  lung]
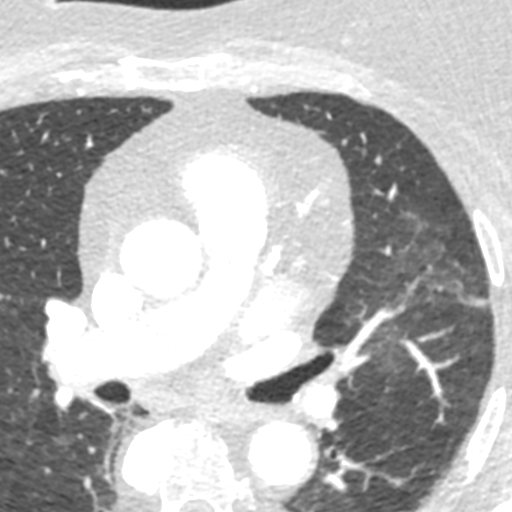
[im 2160/3240  mediastinal]
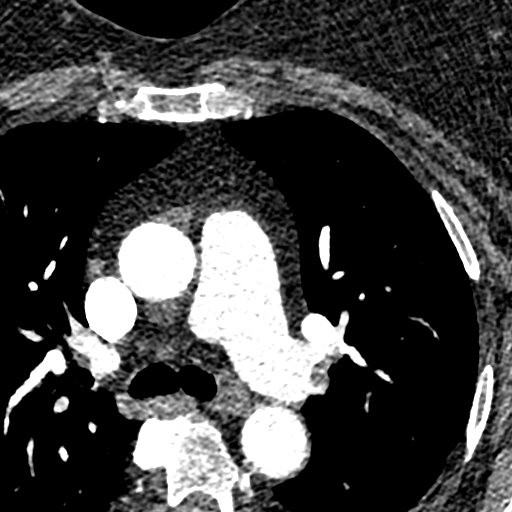
[im 2520/3240  lung]
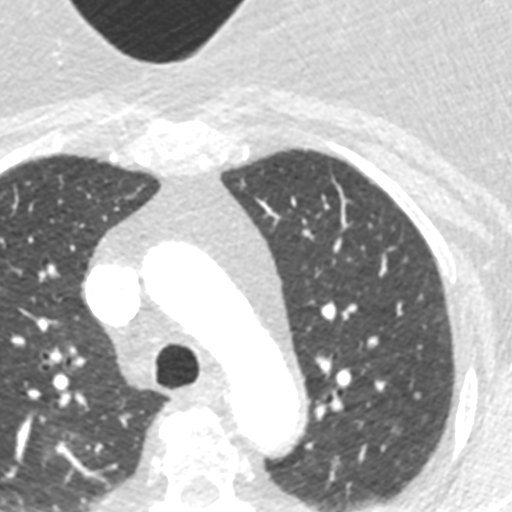
[im 2880/3240  mediastinal]
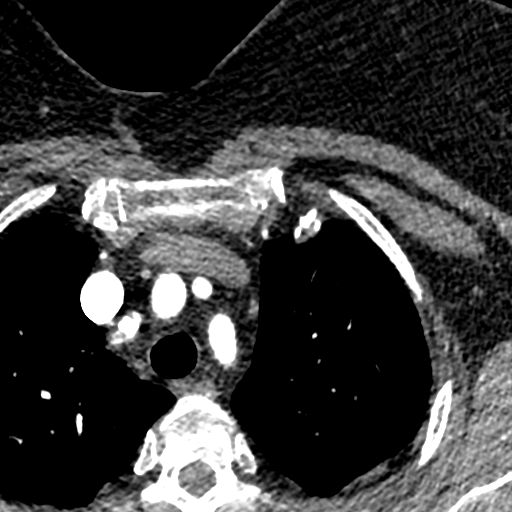

[15 of 37 positions shown; findings below may reference images not displayed]

FINDINGS: CTA CHEST FINDINGS

Cardiovascular: Heart size is mildly enlarged. There is no
significant pericardial fluid, thickening or pericardial
calcification. There is aortic atherosclerosis, as well as
atherosclerosis of the great vessels of the mediastinum and the
coronary arteries, including calcified atherosclerotic plaque in the
left anterior descending and right coronary arteries. Severe
thickening calcification of the aortic valve. Severe calcifications
of the mitral annulus. Dilatation of the pulmonic trunk (3.5 cm in
diameter).

Mediastinum/Lymph Nodes: No pathologically enlarged mediastinal or
hilar lymph nodes. Esophagus is unremarkable in appearance. No
axillary lymphadenopathy.

Lungs/Pleura: Patchy areas of ground-glass attenuation and
interlobular septal thickening noted in the lungs bilaterally, which
suggest a background of very mild interstitial pulmonary edema. No
confluent consolidative airspace disease. No pleural effusions. No
suspicious appearing pulmonary nodules or masses are noted.

Musculoskeletal/Soft Tissues: There are no aggressive appearing
lytic or blastic lesions noted in the visualized portions of the
skeleton.

CTA ABDOMEN AND PELVIS FINDINGS

Hepatobiliary: No suspicious cystic or solid hepatic lesions. No
intra or extrahepatic biliary ductal dilatation. Status post
cholecystectomy.

Pancreas: No pancreatic mass. No pancreatic ductal dilatation. No
pancreatic or peripancreatic fluid collections or inflammatory
changes.

Spleen: Unremarkable.

Adrenals/Urinary Tract: Low-attenuation nonenhancing lesions in both
kidneys, largest of which is in the medial aspect of the interpolar
region of the right kidney measuring 2.5 cm in diameter, compatible
with simple cysts. No suspicious renal lesions. Bilateral adrenal
glands are normal in appearance. No hydroureteronephrosis. Urinary
bladder is nearly decompressed, but otherwise unremarkable in
appearance.

Stomach/Bowel: Normal appearance of the stomach. No pathologic
dilatation of small bowel or colon. A few scattered colonic
diverticulae are noted, particularly in the sigmoid colon, without
surrounding inflammatory changes to suggest an acute diverticulitis
at this time. Normal appendix.

Vascular/Lymphatic: Aortic atherosclerosis, without evidence of
aneurysm or dissection in the abdominal or pelvic vasculature.
Vascular findings and measurements pertinent to potential TAVR
procedure, as detailed below. No lymphadenopathy noted in the
abdomen or pelvis.

Reproductive: Uterus and ovaries are unremarkable in appearance.

Other: No significant volume of ascites.  No pneumoperitoneum.

Musculoskeletal: There are no aggressive appearing lytic or blastic
lesions noted in the visualized portions of the skeleton.

VASCULAR MEASUREMENTS PERTINENT TO TAVR:

AORTA:

Minimal Aortic Diameter- 14 x 15 mm

Severity of Aortic Calcification-mild

RIGHT PELVIS:

Right Common Iliac Artery -

Minimal 6iameter-K.8 x 9.8 mm

Tortuosity-mild

Calcification-none

Right External Iliac Artery -

Minimal 4iameter-V.K x 7.4 mm

Tortuosity-moderate

Calcification-none

Right Common Femoral Artery -

Minimal Oiameter-X.F x 8.2 mm

Tortuosity-mild

Calcification-mild

LEFT PELVIS:

Left Common Iliac Artery -

Minimal Miameter-Z.Z x 10.1 mm

Tortuosity-mild

Calcification-mild

Left External Iliac Artery -

Minimal Yiameter-J.C x 6.7 mm

Tortuosity-moderate to severe

Calcification-none

Left Common Femoral Artery -

Minimal Oiameter-T.K x 7.6 mm

Tortuosity-mild

Calcification-mild

Review of the MIP images confirms the above findings.
IMPRESSION: 1. Vascular findings and measurements pertinent to potential TAVR
procedure, as detailed above.
2. Severe thickening and calcification of the aortic valve,
compatible with the reported clinical history of severe aortic
stenosis.
3. Mild cardiomegaly with evidence of very mild interstitial
pulmonary edema in the lungs, suggesting mild congestive heart
failure.
4. Dilatation of the pulmonic trunk, concerning for pulmonary
arterial hypertension.
5. Aortic atherosclerosis, in addition to 2 vessel coronary artery
disease.
6. Mild colonic diverticulosis without evidence of acute
diverticulitis at this time.
7. Additional incidental findings, as above.

## 2021-07-27 ENCOUNTER — Ambulatory Visit (INDEPENDENT_AMBULATORY_CARE_PROVIDER_SITE_OTHER): Payer: Medicare Other | Admitting: Family Medicine

## 2021-07-27 ENCOUNTER — Encounter: Payer: Self-pay | Admitting: Family Medicine

## 2021-07-27 VITALS — BP 114/66 | HR 85 | Temp 98.0°F | Ht 62.0 in | Wt 228.0 lb

## 2021-07-27 DIAGNOSIS — E1169 Type 2 diabetes mellitus with other specified complication: Secondary | ICD-10-CM

## 2021-07-27 DIAGNOSIS — G4733 Obstructive sleep apnea (adult) (pediatric): Secondary | ICD-10-CM | POA: Diagnosis not present

## 2021-07-27 DIAGNOSIS — K635 Polyp of colon: Secondary | ICD-10-CM

## 2021-07-27 DIAGNOSIS — E669 Obesity, unspecified: Secondary | ICD-10-CM

## 2021-07-27 DIAGNOSIS — Z800 Family history of malignant neoplasm of digestive organs: Secondary | ICD-10-CM

## 2021-07-27 DIAGNOSIS — Z9989 Dependence on other enabling machines and devices: Secondary | ICD-10-CM

## 2021-07-27 MED ORDER — OZEMPIC (0.25 OR 0.5 MG/DOSE) 2 MG/1.5ML ~~LOC~~ SOPN: PEN_INJECTOR | SUBCUTANEOUS | 1 refills | Status: DC

## 2021-07-27 NOTE — Patient Instructions (Addendum)
If you do not hear anything about your referrals in the next 1-2 weeks, call our office and ask for an update. ? ?Keep the diet clean and stay active. ? ?Let me know if there are cost issues with the shot.  ? ?Let us know if you need anything. ?

## 2021-07-27 NOTE — Progress Notes (Signed)
Chief Complaint  ?Patient presents with  ? Sleep Apnea  ?  Refer to DR. Francena Hanly ?[facility] ?[address] ?[place] ?  ? ? ?Subjective: ?Patient is a 76 y.o. female here for f/u. ? ?Patient has sleep apnea requiring CPAP.  She is requesting a referral to Dr. Jeanella Cara. ? ?Patient has a history of colon polyps.  She does not wish to go back to her current gastroenterology team.  She is wondering if Cologuard would be an appropriate screening measure for her. ? ?Patient has a history of morbid obesity in addition to type 2 diabetes.  Her diet is fair and she tries to do some walking but has difficulty losing weight.  She wonders if there is anything that can help with this.  She takes metformin XR 500 mg twice daily. ? ?Past Medical History:  ?Diagnosis Date  ? Anxiety   ? Complication of anesthesia   ? Concussion November 09, 2012  ? From a fall at home on deck  ? Depression   ? 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 Diabetes mellitus ([place])   ? Fatty liver   ? GERD (gastroesophageal reflux disease)   ? HLD (hyperlipidemia)   ? Hypertension   ? Morbid obesity ([place])   ? OSA on CPAP   ? uses CPAP nightly  ? Osteoarthritis of right knee 07/31/2013  ? PONV (postoperative nausea and vomiting)   ? Rosacea   ? Severe aortic stenosis   ? ? ?Objective: ?BP 114/66   Pulse 85   Temp 98 ?F (36.7 ?C) (Oral)   Ht '5\' 2"'$  (1.575 m)   Wt 228 lb (103.4 kg)   SpO2 98%   BMI 41.70 kg/m?  ?General: Awake, appears stated age ?Heart: RRR, no LE edema ?Lungs: CTAB, no rales, wheezes or rhonchi. No accessory muscle use ?Psych: Age appropriate judgment and insight, normal affect and mood ? ?Assessment and Plan: ?OSA on CPAP - Plan: Ambulatory referral to Pulmonology ? ?Polyp of colon, unspecified part of colon, unspecified type - Plan: Ambulatory referral to Gastroenterology ? ?Family history of colon cancer - Plan: Ambulatory referral to Gastroenterology ? ?Diabetes mellitus type 2 in obese ([place]) - Plan: Semaglutide,0.25  or 0.'5MG'$ /DOS, (OZEMPIC, 0.25 OR 0.5 MG/DOSE,) 2 MG/1.5ML SOPN ? ?Refer to sleep team at [facility] ?Referred to [facility] gastroenterology ?As above.  We discussed that Cologuard probably is not the best choice for her. ?Chronic, not controlled with weight.  Start Ozempic 0.25 mg weekly for 4 doses and then increase to 0.5 mg weekly.  Follow-up in 6 weeks. ?The patient voiced understanding and agreement to the plan. ? ?Shelda Pal, DO ?07/27/21  ?3:45 PM ? ? ? ? ?

## 2021-07-30 ENCOUNTER — Other Ambulatory Visit: Payer: Self-pay | Admitting: *Deleted

## 2021-07-30 MED ORDER — OMALIZUMAB 150 MG/ML ~~LOC~~ SOSY: 300.0000 mg | PREFILLED_SYRINGE | SUBCUTANEOUS | 11 refills | Status: DC

## 2021-08-05 ENCOUNTER — Ambulatory Visit: Payer: Medicare Other

## 2021-08-17 DIAGNOSIS — G4733 Obstructive sleep apnea (adult) (pediatric): Secondary | ICD-10-CM | POA: Diagnosis not present

## 2021-08-21 ENCOUNTER — Ambulatory Visit: Payer: Medicare Other | Admitting: Internal Medicine

## 2021-08-25 NOTE — Progress Notes (Deleted)
FOLLOW UP Date of Service/Encounter:  08/25/21   Subjective:  Kara Lee (DOB: [birth year]) is a 76 y.o. female who returns to the [facility] on 08/27/2021 in re-evaluation of the following: urticaria, rhinoconjunctivitis, and adverse food reactions and hymenoptera allergy History obtained from: chart review and {Persons; PED relatives w/patient:19415::"patient"}.  For Review, LV was on 06/17/21  with Dr. Nelva Bush seen for regular follow-up.  Her hives were not controlled, and Xolair was started.   Pertinent History/Diagnostics:  - Allergic Rhinitis:   - labs environmental panel (04/30/21): + tree pollen, detectable grass and weed pollen below traditional threshold for positive - CU labs: CU index elevated (11.6), alpha gal negative, negative thryoid antibodies, normal baseline tryptase (8.2) - shellfish allergy - hymenoptra allergy  Today presents for follow-up.   Allergies as of 08/27/2021       Reactions   Bee Venom Itching, Swelling   Latex Itching, Swelling   Lovenox [enoxaparin Sodium] Other (See Comments)   Bruising   Minocycline Nausea And Vomiting   Morphine And Related Nausea And Vomiting   Nickel Other (See Comments)   Contact dermatitis    Shellfish Allergy Itching   Statins    Muscle aches   Sulfa Antibiotics Nausea And Vomiting   Tape Itching   Bandaids and adhesive tape   Cipro [ciprofloxacin Hcl] Rash   Ciprofloxacin Rash   Glimepiride Rash   Metronidazole Rash        Medication List        Accurate as of Aug 25, 2021  1:46 PM. If you have any questions, ask your nurse or doctor.          acetaminophen 650 MG CR tablet Commonly known as: TYLENOL Take 1,300 mg by mouth every 8 (eight) hours as needed for pain.   aspirin EC 81 MG tablet Take 81 mg by mouth at bedtime.   atenolol 25 MG tablet Commonly known as: TENORMIN TAKE 1 TABLET(25 MG) BY MOUTH TWICE DAILY   cetirizine 10 MG tablet Commonly known as: ZYRTEC Take  10 mg by mouth daily.   clonazePAM 0.5 MG tablet Commonly known as: KLONOPIN Take 0.5-1 tablets (0.25-0.5 mg total) by mouth 2 (two) times daily as needed. for anxiety   EPINEPHrine 0.3 mg/0.3 mL Soaj injection Commonly known as: EpiPen 2-Pak Inject 0.3 mg into the muscle as needed for anaphylaxis.   esomeprazole 40 MG capsule Commonly known as: NEXIUM TAKE 1 CAPSULE(40 MG) BY MOUTH DAILY   estradiol 0.1 MG/GM vaginal cream Commonly known as: ESTRACE Place 1 Applicatorful vaginally as needed (irritation).   famotidine 40 MG tablet Commonly known as: PEPCID Take 40 mg by mouth daily.   FLUoxetine 40 MG capsule Commonly known as: PROZAC TAKE 1 CAPSULE(40 MG) BY MOUTH DAILY   fluticasone 50 MCG/ACT nasal spray Commonly known as: FLONASE Place 1 spray into both nostrils daily as needed for allergies.   hydrOXYzine 25 MG tablet Commonly known as: ATARAX Take 1 tablet (25 mg total) by mouth at bedtime as needed.   insulin glargine 100 UNIT/ML injection Commonly known as: LANTUS Inject 10 Units into the skin daily. Sliding scale whil on prednisone   losartan 25 MG tablet Commonly known as: COZAAR TAKE 1 TABLET(25 MG) BY MOUTH DAILY   metFORMIN 500 MG 24 hr tablet Commonly known as: GLUCOPHAGE-XR TAKE 2 TABLETS(1000 MG) BY MOUTH DAILY What changed: See the new instructions.   METHOCARBAMOL PO Take by mouth as needed (for muscle spasms).  omalizumab 150 MG/ML prefilled syringe Commonly known as: Xolair Inject 300 mg into the skin every 28 (twenty-eight) days.   Ozempic (0.25 or 0.5 MG/DOSE) 2 MG/1.5ML Sopn Generic drug: Semaglutide(0.25 or 0.'5MG'$ /DOS) Inject 0.25 mg into the skin once a week for 28 days, THEN 0.5 mg once a week for 28 days. Start taking on: July 27, 2021   Propylene Glycol 0.6 % Soln Place 1 drop into both eyes daily as needed (dry eyes).   triamcinolone cream 0.1 % Commonly known as: KENALOG Apply to itchy areas on back twice daily.    Zypitamag 4 MG Tabs Generic drug: Pitavastatin Magnesium Take 1 tablet by mouth daily. What changed: additional instructions       Past Medical History:  Diagnosis Date   Anxiety    Complication of anesthesia    Concussion November 09, 2012   From a fall at home on deck   Depression    postpartum depression after 2nd child   Diabetes mellitus ([place])    Fatty liver    GERD (gastroesophageal reflux disease)    HLD (hyperlipidemia)    Hypertension    Morbid obesity (HCC)    OSA on CPAP    uses CPAP nightly   Osteoarthritis of right knee 07/31/2013   PONV (postoperative nausea and vomiting)    Rosacea    Severe aortic stenosis    Past Surgical History:  Procedure Laterality Date   ABDOMINAL HYSTERECTOMY     still has uterus   ANKLE RECONSTRUCTION Left    ARTERY BIOPSY Left 01/21/2020   Procedure: BIOPSY TEMPORAL ARTERY;  Surgeon: Leta Baptist, MD;  Location: [facility];  Service: ENT;  Laterality: Left;   BIOPSY BREAST Bilateral    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   CESAREAN SECTION     x 2   CHOLECYSTECTOMY     COLONOSCOPY     EYE SURGERY     lasik; X3 for crossed eyes; 1 eyelid surgery   HERNIA REPAIR     PARTIAL KNEE ARTHROPLASTY Right 07/31/2013   Procedure: RIGHT UNICOMPARTMENTAL KNEE;  Surgeon: Johnny Bridge, MD;  Location: [facility];  Service: Orthopedics;  Laterality: Right;   RIGHT/LEFT HEART CATH AND CORONARY ANGIOGRAPHY N/A 10/04/2017   Procedure: RIGHT/LEFT HEART CATH AND CORONARY ANGIOGRAPHY;  Surgeon: Adrian Prows, MD;  Location: [facility];  Service: Cardiovascular;  Laterality: N/A;   TEE WITHOUT CARDIOVERSION N/A 08/31/2017   Procedure: TRANSESOPHAGEAL ECHOCARDIOGRAM (TEE);  Surgeon: Adrian Prows, MD;  Location: [facility];  Service: Cardiovascular;  Laterality: N/A;   TEE WITHOUT CARDIOVERSION N/A 01/30/2019   Procedure: TRANSESOPHAGEAL ECHOCARDIOGRAM (TEE);  Surgeon: Sherren Mocha, MD;  Location: [facility];  Service: Open  Heart Surgery;  Laterality: N/A;   TRANSCATHETER AORTIC VALVE REPLACEMENT, TRANSFEMORAL N/A 01/30/2019   Procedure: TRANSCATHETER AORTIC VALVE REPLACEMENT, TRANSFEMORAL;  Surgeon: Sherren Mocha, MD;  Location: [facility];  Service: Open Heart Surgery;  Laterality: N/A;   TUBAL LIGATION     Otherwise, there have been no changes to her past medical history, surgical history, family history, or social history.  ROS: All others negative except as noted per HPI.   Objective:  There were no vitals taken for this visit. There is no height or weight on file to calculate BMI. Physical Exam: General Appearance:  Alert, cooperative, no distress, appears stated age  Head:  Normocephalic, without obvious abnormality, atraumatic  Eyes:  Conjunctiva clear, EOM's intact  Nose: Nares normal, {  Blank multiple:19196:a:"***","hypertrophic turbinates","normal mucosa","no visible anterior polyps","septum midline"}  Throat: Lips, tongue normal; teeth and gums normal, {Blank multiple:19196:a:"***","normal posterior oropharynx","tonsils 2+","tonsils 3+","no tonsillar exudate","+ cobblestoning"}  Neck: Supple, symmetrical  Lungs:   {Blank multiple:19196:a:"***","clear to auscultation bilaterally","end-expiratory wheezing","wheezing throughout"}, Respirations unlabored, {Blank multiple:19196:a:"***","no coughing","intermittent dry coughing"}  Heart:  {Blank multiple:19196:a:"***","regular rate and rhythm","no murmur"}, Appears well perfused  Extremities: No edema  Skin: Skin color, texture, turgor normal, no rashes or lesions on visualized portions of skin  Neurologic: No gross deficits   Reviewed: ***  Spirometry:  Tracings reviewed. Her effort: {Blank single:19197::"Good reproducible efforts.","It was hard to get consistent efforts and there is a question as to whether this reflects a maximal maneuver.","Poor effort, data can not be interpreted.","Variable effort-results affected.","decent for first  attempt at spirometry."} FVC: ***L FEV1: ***L, ***% predicted FEV1/FVC ratio: ***% Interpretation: {Blank single:19197::"Spirometry consistent with mild obstructive disease","Spirometry consistent with moderate obstructive disease","Spirometry consistent with severe obstructive disease","Spirometry consistent with possible restrictive disease","Spirometry consistent with mixed obstructive and restrictive disease","Spirometry uninterpretable due to technique","Spirometry consistent with normal pattern","No overt abnormalities noted given today's efforts"}.  Please see scanned spirometry results for details.  Skin Testing: {Blank single:19197::"Select foods","Environmental allergy panel","Environmental allergy panel and select foods","Food allergy panel","None","Deferred due to recent antihistamines use"}. Positive test to: ***. Negative test to: ***.  Results discussed with patient/family.   {Blank single:19197::"Allergy testing results were read and interpreted by myself, documented by clinical staff."," "}  Assessment/Plan   ***  Sigurd Sos, MD  [facility]

## 2021-08-26 ENCOUNTER — Other Ambulatory Visit: Payer: Self-pay | Admitting: Family Medicine

## 2021-08-27 ENCOUNTER — Ambulatory Visit: Payer: Medicare Other | Admitting: Internal Medicine

## 2021-08-27 DIAGNOSIS — E559 Vitamin D deficiency, unspecified: Secondary | ICD-10-CM | POA: Diagnosis not present

## 2021-08-27 DIAGNOSIS — E1165 Type 2 diabetes mellitus with hyperglycemia: Secondary | ICD-10-CM | POA: Diagnosis not present

## 2021-08-27 DIAGNOSIS — E538 Deficiency of other specified B group vitamins: Secondary | ICD-10-CM | POA: Diagnosis not present

## 2021-08-31 DIAGNOSIS — M2021 Hallux rigidus, right foot: Secondary | ICD-10-CM | POA: Diagnosis not present

## 2021-08-31 DIAGNOSIS — M1711 Unilateral primary osteoarthritis, right knee: Secondary | ICD-10-CM | POA: Diagnosis not present

## 2021-08-31 DIAGNOSIS — M67471 Ganglion, right ankle and foot: Secondary | ICD-10-CM | POA: Diagnosis not present

## 2021-09-03 DIAGNOSIS — K760 Fatty (change of) liver, not elsewhere classified: Secondary | ICD-10-CM | POA: Diagnosis not present

## 2021-09-03 DIAGNOSIS — E118 Type 2 diabetes mellitus with unspecified complications: Secondary | ICD-10-CM | POA: Diagnosis not present

## 2021-09-03 DIAGNOSIS — E785 Hyperlipidemia, unspecified: Secondary | ICD-10-CM | POA: Diagnosis not present

## 2021-09-03 DIAGNOSIS — R809 Proteinuria, unspecified: Secondary | ICD-10-CM | POA: Diagnosis not present

## 2021-09-04 NOTE — Progress Notes (Signed)
FOLLOW UP Date of Service/Encounter:  09/07/21   Subjective:  Kara Lee (DOB: 1945-07-08) is a 76 y.o. female who returns to the [facility] on 09/07/2021 in re-evaluation of the following: chronic urticaria History obtained from: chart review and patient.  For Review, LV was on 06/17/21  with Dr. Nelva Bush seen for regular follow-up.  Her hives were not controlled, and Xolair was discussed.   Pertinent History/Diagnostics:  - Allergic Rhinitis:                 - labs environmental panel (04/30/21): + tree pollen, detectable grass and weed pollen below traditional threshold for positive - CU labs: CU index elevated (11.6), alpha gal negative, negative thryoid antibodies, normal baseline tryptase (8.2) - shellfish allergy"she can eat a small portion of popcorn shrimp without issue but if she eats a large portion she does get itchy.  Thus she tries to avoid.  She states she can eat fish like flounder without issue.  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  Carries an Epipen.  Today presents for follow-up. She did not start Xolair since last visit.  She is taking pepcid 1 tablet twice a day with zyrtec 1 tablet twice a day.  This has controlled her hives so she does not feel she needs Xolair at this time.  She is not sure if she is taking Singulair nightly or not. She does question whether Zyrtec is making her feel more tired than normal.  Allegra made her drowsy, and Claritin was ineffective.  She has not tried Xyzal. Otherwise she is avoiding shellfish and has had no bee sting since last visit. Her allergic rhinitis symptoms have also been well controlled due to the increase Zyrtec. She is planning on getting her second Shingrix vaccine in a few weeks and wonders how this will affect her hives   Allergies as of 09/07/2021       Reactions   Bee Venom Itching, Swelling   Latex Itching, Swelling   Lovenox [enoxaparin Sodium] Other (See Comments)    Bruising   Minocycline Nausea And Vomiting   Morphine And Related Nausea And Vomiting   Nickel Other (See Comments)   Contact dermatitis    Shellfish Allergy Itching   Statins    Muscle aches   Sulfa Antibiotics Nausea And Vomiting   Tape Itching   Bandaids and adhesive tape   Cipro [ciprofloxacin Hcl] Rash   Ciprofloxacin Rash   Glimepiride Rash   Metronidazole Rash        Medication List        Accurate as of Sep 07, 2021  1:17 PM. If you have any questions, ask your nurse or doctor.          STOP taking these medications    hydrOXYzine 25 MG tablet Commonly known as: ATARAX Stopped by: Sigurd Sos, MD       TAKE these medications    acetaminophen 650 MG CR tablet Commonly known as: TYLENOL Take 1,300 mg by mouth every 8 (eight) hours as needed for pain.   aspirin EC 81 MG tablet Take 81 mg by mouth at bedtime.   atenolol 25 MG tablet Commonly known as: TENORMIN TAKE 1 TABLET(25 MG) BY MOUTH TWICE DAILY   cetirizine 10 MG tablet Commonly known as: ZYRTEC Take 10 mg by mouth daily.   clonazePAM 0.5 MG tablet Commonly known as: KLONOPIN Take 0.5-1 tablets (0.25-0.5 mg total) by mouth 2 (two) times daily as  needed. for anxiety   EPINEPHrine 0.3 mg/0.3 mL Soaj injection Commonly known as: EpiPen 2-Pak Inject 0.3 mg into the muscle as needed for anaphylaxis.   esomeprazole 40 MG capsule Commonly known as: NEXIUM TAKE 1 CAPSULE(40 MG) BY MOUTH DAILY   estradiol 0.1 MG/GM vaginal cream Commonly known as: ESTRACE Place 1 Applicatorful vaginally as needed (irritation).   famotidine 40 MG tablet Commonly known as: PEPCID Take 40 mg by mouth daily.   FLUoxetine 40 MG capsule Commonly known as: PROZAC TAKE 1 CAPSULE(40 MG) BY MOUTH DAILY   fluticasone 50 MCG/ACT nasal spray Commonly known as: FLONASE Place 1 spray into both nostrils daily as needed for allergies.   insulin glargine 100 UNIT/ML injection Commonly known as: LANTUS Inject 10  Units into the skin daily. Sliding scale whil on prednisone   losartan 25 MG tablet Commonly known as: COZAAR TAKE 1 TABLET(25 MG) BY MOUTH DAILY   metFORMIN 500 MG 24 hr tablet Commonly known as: GLUCOPHAGE-XR TAKE 2 TABLETS(1000 MG) BY MOUTH DAILY What changed: See the new instructions.   METHOCARBAMOL PO Take by mouth as needed (for muscle spasms).   omalizumab 150 MG/ML prefilled syringe Commonly known as: Xolair Inject 300 mg into the skin every 28 (twenty-eight) days.   Ozempic (0.25 or 0.5 MG/DOSE) 2 MG/1.5ML Sopn Generic drug: Semaglutide(0.25 or 0.'5MG'$ /DOS) Inject 0.25 mg into the skin once a week for 28 days, THEN 0.5 mg once a week for 28 days. Start taking on: July 27, 2021   Propylene Glycol 0.6 % Soln Place 1 drop into both eyes daily as needed (dry eyes).   triamcinolone cream 0.1 % Commonly known as: KENALOG Apply to itchy areas on back twice daily.   Zypitamag 4 MG Tabs Generic drug: Pitavastatin Magnesium Take 1 tablet by mouth daily. What changed: additional instructions       Past Medical History:  Diagnosis Date   Anxiety    Complication of anesthesia    Concussion November 09, 2012   From a fall at home on deck   Depression    postpartum depression after 2nd child   Diabetes mellitus ([place])    Fatty liver    GERD (gastroesophageal reflux disease)    HLD (hyperlipidemia)    Hypertension    Morbid obesity (HCC)    OSA on CPAP    uses CPAP nightly   Osteoarthritis of right knee 07/31/2013   PONV (postoperative nausea and vomiting)    Rosacea    Severe aortic stenosis    Past Surgical History:  Procedure Laterality Date   ABDOMINAL HYSTERECTOMY     still has uterus   ANKLE RECONSTRUCTION Left    ARTERY BIOPSY Left 01/21/2020   Procedure: BIOPSY TEMPORAL ARTERY;  Surgeon: Leta Baptist, MD;  Location: [facility];  Service: ENT;  Laterality: Left;   BIOPSY BREAST Bilateral    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   CESAREAN  SECTION     x 2   CHOLECYSTECTOMY     COLONOSCOPY     EYE SURGERY     lasik; X3 for crossed eyes; 1 eyelid surgery   HERNIA REPAIR     PARTIAL KNEE ARTHROPLASTY Right 07/31/2013   Procedure: RIGHT UNICOMPARTMENTAL KNEE;  Surgeon: Johnny Bridge, MD;  Location: [facility];  Service: Orthopedics;  Laterality: Right;   RIGHT/LEFT HEART CATH AND CORONARY ANGIOGRAPHY N/A 10/04/2017   Procedure: RIGHT/LEFT HEART CATH AND CORONARY ANGIOGRAPHY;  Surgeon: Adrian Prows, MD;  Location: [facility];  Service: Cardiovascular;  Laterality: N/A;   TEE WITHOUT CARDIOVERSION N/A 08/31/2017   Procedure: TRANSESOPHAGEAL ECHOCARDIOGRAM (TEE);  Surgeon: Adrian Prows, MD;  Location: [facility];  Service: Cardiovascular;  Laterality: N/A;   TEE WITHOUT CARDIOVERSION N/A 01/30/2019   Procedure: TRANSESOPHAGEAL ECHOCARDIOGRAM (TEE);  Surgeon: Sherren Mocha, MD;  Location: [facility];  Service: Open Heart Surgery;  Laterality: N/A;   TRANSCATHETER AORTIC VALVE REPLACEMENT, TRANSFEMORAL N/A 01/30/2019   Procedure: TRANSCATHETER AORTIC VALVE REPLACEMENT, TRANSFEMORAL;  Surgeon: Sherren Mocha, MD;  Location: [facility];  Service: Open Heart Surgery;  Laterality: N/A;   TUBAL LIGATION     Otherwise, there have been no changes to her past medical history, surgical history, family history, or social history.  ROS: All others negative except as noted per HPI.   Objective:  BP (!) 108/58 (BP Location: Left Arm, Patient Position: Sitting, Cuff Size: Large)   Pulse 64   Temp 98.1 F (36.7 C) (Temporal)   Resp 16   SpO2 96%  There is no height or weight on file to calculate BMI. Physical Exam: General Appearance:  Alert, cooperative, no distress, appears stated age  Head:  Normocephalic, without obvious abnormality, atraumatic  Eyes:  Conjunctiva clear, EOM's intact  Nose: Nares normal, hypertrophic turbinates, normal mucosa, and no visible anterior polyps  Throat: Lips, tongue normal; teeth and  gums normal, normal posterior oropharynx  Neck: Supple, symmetrical  Lungs:   clear to auscultation bilaterally, Respirations unlabored, no coughing  Heart:  regular rate and rhythm and no murmur, Appears well perfused  Extremities: No edema  Skin: Skin color, texture, turgor normal, no rashes or lesions on visualized portions of skin  Neurologic: No gross deficits   Assessment/Plan  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  She does not wish to pursue Xolair injections at this time.  We did discuss that she may experience a flare of her hives following her upcoming vaccination.  If this occurs we discussed increasing her Zyrtec to 2 tablets twice daily and starting Singulair if she has not already.  Also encouraged her to call the clinic if she is still flaring despite these recommendations.   I also encouraged her to try Xyzal as an alternative to Zyrtec to see if this causes less drowsiness.  She is somewhat hesitant to make any changes due to current control of symptoms.  Did provide her with a sample of this medication.   Otherwise we made no changes to her plan.  Chronic urticaria - for hive control continue taking high-dose antihistamine therapy: Zyrtec 1-2 tablets 1-2 times a day with Pepcid 1 tablet 1-2 times a day and Singulair '10mg'$  daily - reserve Hydroxyzine for as needed use for breakthrough hives - if you are uncontrolled on this, please call to schedule a follow-up and we can re-discuss Xolair (omalizumab) the injectable medicine for hives  Food Allergy - continue avoidance of shellfish and stinging insects - have access to self-injectable epinephrine (Epipen or AuviQ) 0.'3mg'$  at all times - follow emergency action plan in case of allergic reaction  Allergic rhinitis:  - environmental allergy panel showed detectable IgE to grass pollen, tree pollen and weed pollen.  Continue avoidance measures for these  allergens. - Zyrtec and Singulair as above should help with allergy symptom control - use Flonase2 sprays each nostril daily for 1-2 weeks at a time before stopping once nasal congestion improves for maximum benefit -  for itchy watery eyes she can use Pataday 1 drop each eye daily as needed.  Uses over-the-counter  Follow-up in 6 months or sooner if needed  Sigurd Sos, MD  [facility]

## 2021-09-07 ENCOUNTER — Ambulatory Visit: Payer: Medicare Other | Admitting: Internal Medicine

## 2021-09-07 ENCOUNTER — Encounter: Payer: Self-pay | Admitting: Internal Medicine

## 2021-09-07 VITALS — BP 108/58 | HR 64 | Temp 98.1°F | Resp 16

## 2021-09-07 DIAGNOSIS — T781XXD Other adverse food reactions, not elsewhere classified, subsequent encounter: Secondary | ICD-10-CM

## 2021-09-07 DIAGNOSIS — L508 Other urticaria: Secondary | ICD-10-CM | POA: Diagnosis not present

## 2021-09-07 DIAGNOSIS — J301 Allergic rhinitis due to pollen: Secondary | ICD-10-CM

## 2021-09-07 DIAGNOSIS — T63481A Toxic effect of venom of other arthropod, accidental (unintentional), initial encounter: Secondary | ICD-10-CM

## 2021-09-07 NOTE — Patient Instructions (Addendum)
Chronic urticaria - for hive control continue taking high-dose antihistamine therapy: Zyrtec 1-2 tablets 1-2 times a day with Pepcid 1 tablet 1-2 times a day and Singulair '10mg'$  daily - reserve Hydroxyzine for as needed use for breakthrough hives - if you are uncontrolled on this, please call to schedule a follow-up and we can re-discuss Xolair (omalizumab) the injectable medicine for hives  Food Allergy - continue avoidance of shellfish and stinging insects - have access to self-injectable epinephrine (Epipen or AuviQ) 0.'3mg'$  at all times - follow emergency action plan in case of allergic reaction  Allergic rhinitis:  - environmental allergy panel showed detectable IgE to grass pollen, tree pollen and weed pollen.  Continue avoidance measures for these allergens. - Zyrtec and Singulair as above should help with allergy symptom control - use Flonase2 sprays each nostril daily for 1-2 weeks at a time before stopping once nasal congestion improves for maximum benefit - for itchy watery eyes she can use Pataday 1 drop each eye daily as needed.  Uses over-the-counter  Follow-up in 6 months or sooner if needed It

## 2021-09-13 ENCOUNTER — Encounter: Payer: Self-pay | Admitting: Family Medicine

## 2021-09-15 DIAGNOSIS — G4733 Obstructive sleep apnea (adult) (pediatric): Secondary | ICD-10-CM | POA: Diagnosis not present

## 2021-09-17 ENCOUNTER — Encounter: Payer: Self-pay | Admitting: Family Medicine

## 2021-09-17 ENCOUNTER — Ambulatory Visit (INDEPENDENT_AMBULATORY_CARE_PROVIDER_SITE_OTHER): Payer: Medicare Other | Admitting: Family Medicine

## 2021-09-17 VITALS — BP 112/63 | HR 79 | Temp 98.6°F | Ht 63.0 in | Wt 231.1 lb

## 2021-09-17 DIAGNOSIS — E1169 Type 2 diabetes mellitus with other specified complication: Secondary | ICD-10-CM

## 2021-09-17 DIAGNOSIS — E669 Obesity, unspecified: Secondary | ICD-10-CM | POA: Diagnosis not present

## 2021-09-17 DIAGNOSIS — T50905A Adverse effect of unspecified drugs, medicaments and biological substances, initial encounter: Secondary | ICD-10-CM

## 2021-09-17 DIAGNOSIS — G4733 Obstructive sleep apnea (adult) (pediatric): Secondary | ICD-10-CM | POA: Diagnosis not present

## 2021-09-17 MED ORDER — ONDANSETRON 4 MG PO TBDP: 4.0000 mg | ORAL_TABLET | Freq: Three times a day (TID) | ORAL | 0 refills | Status: DC | PRN

## 2021-09-17 NOTE — Progress Notes (Signed)
Chief Complaint  Patient presents with   Medication Problem    Ozempic     Subjective: Patient is a 76 y.o. female here for f/u.  Patient was started on Ozempic 0.25 mg weekly.  She saw her endocrinologist and she recommended the patient start 0.5 mg weekly.  She took her first dose several days ago.  Since that time, she has had abdominal fullness, some diarrhea, and nausea.  She is not having any vomiting, bleeding, fevers, or urinary complaints.  Past Medical History:  Diagnosis Date   Anxiety    Complication of anesthesia    Concussion November 09, 2012   From a fall at home on deck   Depression    postpartum depression after 2nd child   Diabetes mellitus ([place])    Fatty liver    GERD (gastroesophageal reflux disease)    HLD (hyperlipidemia)    Hypertension    Morbid obesity (HCC)    OSA on CPAP    uses CPAP nightly   Osteoarthritis of right knee 07/31/2013   PONV (postoperative nausea and vomiting)    Rosacea    Severe aortic stenosis     Objective: BP 112/63   Pulse 79   Temp 98.6 F (37 C) (Oral)   Ht '5\' 3"'$  (1.6 m)   Wt 231 lb 2 oz (104.8 kg)   SpO2 97%   BMI 40.94 kg/m  General: Awake, appears stated age Heart: RRR, no LE edema Lungs: CTAB, no rales, wheezes or rhonchi. No accessory muscle use Abdomen: Bowel sounds present, soft, mild tenderness to palpation in the epigastric region, no masses or organomegaly Psych: Age appropriate judgment and insight, normal affect and mood  Assessment and Plan: Diabetes mellitus type 2 in obese (HCC)  Adverse effect of drug, initial encounter - Plan: ondansetron (ZOFRAN-ODT) 4 MG disintegrating tablet  Adverse effect of chronic med (Ozempic). Will decrease dosage to 0.25 mg weekly for 4 weeks and then increase to 0.5 mg weekliy if still tolerated. Zofran prn. Stay hydrated. Add Metamucil to regimen to help with looser stool.  The patient voiced understanding and agreement to the plan.  [place],  DO 09/17/21  4:02 PM

## 2021-09-17 NOTE — Patient Instructions (Addendum)
Take Ozempic 0.25 mg weekly for 4 weeks. Then move up to 0.5 mg weekly.  Keep the diet clean and stay active.  Take Metamucil or Benefiber daily.  Try to drink 55-60 oz of water daily outside of exercise.  Let us know if you need anything.

## 2021-09-18 ENCOUNTER — Ambulatory Visit (INDEPENDENT_AMBULATORY_CARE_PROVIDER_SITE_OTHER): Payer: Medicare Other

## 2021-09-18 DIAGNOSIS — Z0000 Encounter for general adult medical examination without abnormal findings: Secondary | ICD-10-CM

## 2021-09-18 NOTE — Patient Instructions (Signed)
Kara Lee , Thank you for taking time to come for your Medicare Wellness Visit. I appreciate your ongoing commitment to your health goals. Please review the following plan we discussed and let me know if I can assist you in the future.   Screening recommendations/referrals: Colonoscopy: 06/02/16 due 06/02/26 Mammogram: 01/05/21 due 01/05/22 Bone Density: 01/23/20 due 01/22/22 Recommended yearly ophthalmology/optometry visit for glaucoma screening and checkup Recommended yearly dental visit for hygiene and checkup  Vaccinations: Influenza vaccine: up to date Pneumococcal vaccine: up to date Tdap vaccine: Due-May obtain vaccine at our office or your local pharmacy.  Shingles vaccine: Due-May obtain vaccine at our office or your local pharmacy.    Covid-19:up to date  Advanced directives: no, packet sent  Conditions/risks identified: see problem list   Next appointment: Follow up in one year for your annual wellness visit    Preventive Care 65 Years and Older, Female Preventive care refers to lifestyle choices and visits with your health care provider that can promote health and wellness. What does preventive care include? A yearly physical exam. This is also called an annual well check. Dental exams once or twice a year. Routine eye exams. Ask your health care provider how often you should have your eyes checked. Personal lifestyle choices, including: Daily care of your teeth and gums. Regular physical activity. Eating a healthy diet. Avoiding tobacco and drug use. Limiting alcohol use. Practicing safe sex. Taking low-dose aspirin every day. Taking vitamin and mineral supplements as recommended by your health care provider. What happens during an annual well check? The services and screenings done by your health care provider during your annual well check will depend on your age, overall health, lifestyle risk factors, and family history of disease. Counseling  Your health care  provider may ask you questions about your: Alcohol use. Tobacco use. Drug use. Emotional well-being. Home and relationship well-being. Sexual activity. Eating habits. History of falls. Memory and ability to understand (cognition). Work and work Statistician. Reproductive health. Screening  You may have the following tests or measurements: Height, weight, and BMI. Blood pressure. Lipid and cholesterol levels. These may be checked every 5 years, or more frequently if you are over 62 years old. Skin check. Lung cancer screening. You may have this screening every year starting at age 33 if you have a 30-pack-year history of smoking and currently smoke or have quit within the past 15 years. Fecal occult blood test (FOBT) of the stool. You may have this test every year starting at age 84. Flexible sigmoidoscopy or colonoscopy. You may have a sigmoidoscopy every 5 years or a colonoscopy every 10 years starting at age 60. Hepatitis C blood test. Hepatitis B blood test. Sexually transmitted disease (STD) testing. Diabetes screening. This is done by checking your blood sugar (glucose) after you have not eaten for a while (fasting). You may have this done every 1-3 years. Bone density scan. This is done to screen for osteoporosis. You may have this done starting at age 26. Mammogram. This may be done every 1-2 years. Talk to your health care provider about how often you should have regular mammograms. Talk with your health care provider about your test results, treatment options, and if necessary, the need for more tests. Vaccines  Your health care provider may recommend certain vaccines, such as: Influenza vaccine. This is recommended every year. Tetanus, diphtheria, and acellular pertussis (Tdap, Td) vaccine. You may need a Td booster every 10 years. Zoster vaccine. You may need this after  age 30. Pneumococcal 13-valent conjugate (PCV13) vaccine. One dose is recommended after age  80. Pneumococcal polysaccharide (PPSV23) vaccine. One dose is recommended after age 30. Talk to your health care provider about which screenings and vaccines you need and how often you need them. This information is not intended to replace advice given to you by your health care provider. Make sure you discuss any questions you have with your health care provider. Document Released: 05/02/2015 Document Revised: 12/24/2015 Document Reviewed: 02/04/2015 Elsevier Interactive Patient Education  2017 [place] Prevention in the Home Falls can cause injuries. They can happen to people of all ages. There are many things you can do to make your home safe and to help prevent falls. What can I do on the outside of my home? Regularly fix the edges of walkways and driveways and fix any cracks. Remove anything that might make you trip as you walk through a door, such as a raised step or threshold. Trim any bushes or trees on the path to your home. Use bright outdoor lighting. Clear any walking paths of anything that might make someone trip, such as rocks or tools. Regularly check to see if handrails are loose or broken. Make sure that both sides of any steps have handrails. Any raised decks and porches should have guardrails on the edges. Have any leaves, snow, or ice cleared regularly. Use sand or salt on walking paths during winter. Clean up any spills in your garage right away. This includes oil or grease spills. What can I do in the bathroom? Use night lights. Install grab bars by the toilet and in the tub and shower. Do not use towel bars as grab bars. Use non-skid mats or decals in the tub or shower. If you need to sit down in the shower, use a plastic, non-slip stool. Keep the floor dry. Clean up any water that spills on the floor as soon as it happens. Remove soap buildup in the tub or shower regularly. Attach bath mats securely with double-sided non-slip rug tape. Do not have throw  rugs and other things on the floor that can make you trip. What can I do in the bedroom? Use night lights. Make sure that you have a light by your bed that is easy to reach. Do not use any sheets or blankets that are too big for your bed. They should not hang down onto the floor. Have a firm chair that has side arms. You can use this for support while you get dressed. Do not have throw rugs and other things on the floor that can make you trip. What can I do in the kitchen? Clean up any spills right away. Avoid walking on wet floors. Keep items that you use a lot in easy-to-reach places. If you need to reach something above you, use a strong step stool that has a grab bar. Keep electrical cords out of the way. Do not use floor polish or wax that makes floors slippery. If you must use wax, use non-skid floor wax. Do not have throw rugs and other things on the floor that can make you trip. What can I do with my stairs? Do not leave any items on the stairs. Make sure that there are handrails on both sides of the stairs and use them. Fix handrails that are broken or loose. Make sure that handrails are as long as the stairways. Check any carpeting to make sure that it is firmly attached to the stairs.  Fix any carpet that is loose or worn. Avoid having throw rugs at the top or bottom of the stairs. If you do have throw rugs, attach them to the floor with carpet tape. Make sure that you have a light switch at the top of the stairs and the bottom of the stairs. If you do not have them, ask someone to add them for you. What else can I do to help prevent falls? Wear shoes that: Do not have high heels. Have rubber bottoms. Are comfortable and fit you well. Are closed at the toe. Do not wear sandals. If you use a stepladder: Make sure that it is fully opened. Do not climb a closed stepladder. Make sure that both sides of the stepladder are locked into place. Ask someone to hold it for you, if  possible. Clearly mark and make sure that you can see: Any grab bars or handrails. First and last steps. Where the edge of each step is. Use tools that help you move around (mobility aids) if they are needed. These include: Canes. Walkers. Scooters. Crutches. Turn on the lights when you go into a dark area. Replace any light bulbs as soon as they burn out. Set up your furniture so you have a clear path. Avoid moving your furniture around. If any of your floors are uneven, fix them. If there are any pets around you, be aware of where they are. Review your medicines with your doctor. Some medicines can make you feel dizzy. This can increase your chance of falling. Ask your doctor what other things that you can do to help prevent falls. This information is not intended to replace advice given to you by your health care provider. Make sure you discuss any questions you have with your health care provider. Document Released: 01/30/2009 Document Revised: 09/11/2015 Document Reviewed: 05/10/2014 Elsevier Interactive Patient Education  2017 [employer].

## 2021-09-18 NOTE — Progress Notes (Addendum)
Subjective:   Kara Lee is a 76 y.o. female who presents for an Initial Medicare Annual Wellness Visit.  I connected with  [name] on 09/18/21 by a audio enabled telemedicine application and verified that I am speaking with the correct person using two identifiers.  Patient Location: Home  Provider Location: Office/Clinic  I discussed the limitations of evaluation and management by telemedicine. The patient expressed understanding and agreed to proceed.   Review of Systems     Cardiac Risk Factors include: advanced age (>68mn, >>66women);diabetes mellitus;hypertension;dyslipidemia;obesity (BMI >30kg/m2)     Objective:    There were no vitals filed for this visit. There is no height or weight on file to calculate BMI.     09/18/2021    2:23 PM 04/19/2020   11:47 AM 02/03/2020   10:36 AM 01/21/2020    7:40 AM 01/16/2020    9:45 AM 01/30/2019    7:50 PM 01/26/2019    9:28 AM  Advanced Directives  Does Patient Have a Medical Advance Directive? Yes No No No Yes Yes Yes  Type of [place] of [place] DNR (pink MOST or yellow form);Living will  Living will;Healthcare Power of Attorney  Living will;Healthcare Power of Attorney Living will;Healthcare Power of Attorney Living will;Healthcare Power of Attorney  Does patient want to make changes to medical advance directive? No - Patient declined  Yes (ED - Information included in AVS) No - Patient declined No - Patient declined No - Patient declined   Copy of [place]in Chart? No - copy requested  No - copy requested   No - copy requested No - copy requested  Would patient like information on creating a medical advance directive?    No - Patient declined       Current Medications (verified) Outpatient Encounter Medications as of 09/18/2021  Medication Sig   acetaminophen (TYLENOL) 650 MG CR tablet Take 1,300 mg by mouth every 8 (eight) hours as needed for pain.    aspirin EC 81 MG tablet Take 81 mg by mouth at bedtime.    atenolol (TENORMIN) 25 MG tablet TAKE 1 TABLET(25 MG) BY MOUTH TWICE DAILY   cetirizine (ZYRTEC) 10 MG tablet Take 10 mg by mouth daily.   clonazePAM (KLONOPIN) 0.5 MG tablet Take 0.5-1 tablets (0.25-0.5 mg total) by mouth 2 (two) times daily as needed. for anxiety   EPINEPHrine (EPIPEN 2-PAK) 0.3 mg/0.3 mL IJ SOAJ injection Inject 0.3 mg into the muscle as needed for anaphylaxis.   esomeprazole (NEXIUM) 40 MG capsule TAKE 1 CAPSULE(40 MG) BY MOUTH DAILY   estradiol (ESTRACE) 0.1 MG/GM vaginal cream Place 1 Applicatorful vaginally as needed (irritation).    famotidine (PEPCID) 40 MG tablet Take 40 mg by mouth daily.   FLUoxetine (PROZAC) 40 MG capsule TAKE 1 CAPSULE(40 MG) BY MOUTH DAILY   fluticasone (FLONASE) 50 MCG/ACT nasal spray Place 1 spray into both nostrils daily as needed for allergies.   insulin glargine (LANTUS) 100 UNIT/ML injection Inject 10 Units into the skin daily. Sliding scale whil on prednisone   losartan (COZAAR) 25 MG tablet TAKE 1 TABLET(25 MG) BY MOUTH DAILY   metFORMIN (GLUCOPHAGE-XR) 500 MG 24 hr tablet TAKE 2 TABLETS(1000 MG) BY MOUTH DAILY (Patient taking differently: 500 mg in the morning and at bedtime.)   METHOCARBAMOL PO Take by mouth as needed (for muscle spasms).   ondansetron (ZOFRAN-ODT) 4 MG disintegrating tablet Take 1 tablet (4 mg total) by  mouth every 8 (eight) hours as needed for nausea or vomiting.   Propylene Glycol 0.6 % SOLN Place 1 drop into both eyes daily as needed (dry eyes).   triamcinolone cream (KENALOG) 0.1 % Apply to itchy areas on back twice daily.   ZYPITAMAG 4 MG TABS Take 1 tablet by mouth daily. (Patient taking differently: Take 1 tablet by mouth daily. For cholesterol)   [DISCONTINUED] Semaglutide,0.25 or 0.'5MG'$ /DOS, (OZEMPIC, 0.25 OR 0.5 MG/DOSE,) 2 MG/1.5ML SOPN Inject 0.25 mg into the skin once a week for 28 days, THEN 0.5 mg once a week for 28 days.   No facility-administered  encounter medications on file as of 09/18/2021.    Allergies (verified) Bee venom, Latex, Lovenox [enoxaparin sodium], Minocycline, Morphine and related, Nickel, Shellfish allergy, Statins, Sulfa antibiotics, Tape, Cipro [ciprofloxacin hcl], Ciprofloxacin, Glimepiride, and Metronidazole   History: Past Medical History:  Diagnosis Date   Anxiety    Complication of anesthesia    Concussion November 09, 2012   From a fall at home on deck   Depression    postpartum depression after 2nd child   Diabetes mellitus ([place])    Fatty liver    GERD (gastroesophageal reflux disease)    HLD (hyperlipidemia)    Hypertension    Morbid obesity (HCC)    OSA on CPAP    uses CPAP nightly   Osteoarthritis of right knee 07/31/2013   PONV (postoperative nausea and vomiting)    Rosacea    Severe aortic stenosis    Past Surgical History:  Procedure Laterality Date   ABDOMINAL HYSTERECTOMY     still has uterus   ANKLE RECONSTRUCTION Left    ARTERY BIOPSY Left 01/21/2020   Procedure: BIOPSY TEMPORAL ARTERY;  Surgeon: Leta Baptist, MD;  Location: [facility];  Service: ENT;  Laterality: Left;   BIOPSY BREAST Bilateral    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   CESAREAN SECTION     x 2   CHOLECYSTECTOMY     COLONOSCOPY     EYE SURGERY     lasik; X3 for crossed eyes; 1 eyelid surgery   HERNIA REPAIR     PARTIAL KNEE ARTHROPLASTY Right 07/31/2013   Procedure: RIGHT UNICOMPARTMENTAL KNEE;  Surgeon: Johnny Bridge, MD;  Location: [facility];  Service: Orthopedics;  Laterality: Right;   RIGHT/LEFT HEART CATH AND CORONARY ANGIOGRAPHY N/A 10/04/2017   Procedure: RIGHT/LEFT HEART CATH AND CORONARY ANGIOGRAPHY;  Surgeon: Adrian Prows, MD;  Location: [facility];  Service: Cardiovascular;  Laterality: N/A;   TEE WITHOUT CARDIOVERSION N/A 08/31/2017   Procedure: TRANSESOPHAGEAL ECHOCARDIOGRAM (TEE);  Surgeon: Adrian Prows, MD;  Location: [facility];  Service: Cardiovascular;  Laterality: N/A;   TEE  WITHOUT CARDIOVERSION N/A 01/30/2019   Procedure: TRANSESOPHAGEAL ECHOCARDIOGRAM (TEE);  Surgeon: Sherren Mocha, MD;  Location: [facility];  Service: Open Heart Surgery;  Laterality: N/A;   TRANSCATHETER AORTIC VALVE REPLACEMENT, TRANSFEMORAL N/A 01/30/2019   Procedure: TRANSCATHETER AORTIC VALVE REPLACEMENT, TRANSFEMORAL;  Surgeon: Sherren Mocha, MD;  Location: [facility];  Service: Open Heart Surgery;  Laterality: N/A;   TUBAL LIGATION     Family History  Problem Relation Age of Onset   Pancreatitis Mother    COPD Mother    Cancer - Lung Mother    Diabetes type II Mother    Allergic rhinitis Maternal Grandmother    Breast cancer Maternal Grandmother    Cataracts Maternal Grandfather    Asthma Son    Immunodeficiency  Neg Hx    Angioedema Neg Hx    Eczema Neg Hx    Urticaria Neg Hx    Social History   Socioeconomic History   Marital status: Married    Spouse name: Not on file   Number of children: 2   Years of education: Not on file   Highest education level: Not on file  Occupational History   Not on file  Tobacco Use   Smoking status: Never   Smokeless tobacco: Never  Vaping Use   Vaping Use: Never used  Substance and Sexual Activity   Alcohol use: Not Currently   Drug use: No   Sexual activity: Not Currently    Partners: Male    Birth control/protection: Surgical  Other Topics Concern   Not on file  Social History Narrative   Not on file   Social Determinants of Health   Financial Resource Strain: Low Risk    Difficulty of Paying Living Expenses: Not hard at all  Food Insecurity: No Food Insecurity   Worried About Charity fundraiser in the Last Year: Never true   [place] in the Last Year: Never true  Transportation Needs: No Transportation Needs   Lack of Transportation (Medical): No   Lack of Transportation (Non-Medical): No  Physical Activity: Sufficiently Active   Days of Exercise per Week: 7 days   Minutes of Exercise per  Session: 30 min  Stress: No Stress Concern Present   Feeling of Stress : Only a little  Social Connections: Moderately Isolated   Frequency of Communication with Friends and Family: More than three times a week   Frequency of Social Gatherings with Friends and Family: Three times a week   Attends Religious Services: Never   Active Member of Clubs or Organizations: No   Attends Music therapist: Never   Marital Status: Married    Tobacco Counseling Counseling given: Not Answered   Clinical Intake:  Pre-visit preparation completed: Yes  Pain : No/denies pain     Nutritional Risks: None Diabetes: Yes CBG done?: No Did pt. bring in CBG monitor from home?: No  How often do you need to have someone help you when you read instructions, pamphlets, or other written materials from your doctor or pharmacy?: 1 - Never  Diabetic?yes Nutrition Risk Assessment:  Has the patient had any N/V/D within the last 2 months?  No  Does the patient have any non-healing wounds?  No  Has the patient had any unintentional weight loss or weight gain?  No   Diabetes:  Is the patient diabetic?  No  If diabetic, was a CBG obtained today?  No  Did the patient bring in their glucometer from home?  No  How often do you monitor your CBG's? Once daily.   Financial Strains and Diabetes Management:  Are you having any financial strains with the device, your supplies or your medication? No .  Does the patient want to be seen by Chronic Care Management for management of their diabetes?  No  Would the patient like to be referred to a Nutritionist or for Diabetic Management?  No   Diabetic Exams:  Diabetic Eye Exam: Overdue for diabetic eye exam. Pt has been advised about the importance in completing this exam. Patient advised to call and schedule an eye exam. Diabetic Foot Exam: Overdue, Pt has been advised about the importance in completing this exam. Pt is scheduled for diabetic foot exam  on N/A.  Interpreter Needed?: No  Information entered by :: [place] of Daily Living    09/18/2021    2:27 PM  In your present state of health, do you have any difficulty performing the following activities:  Hearing? 1  Vision? 1  Difficulty concentrating or making decisions? 0  Walking or climbing stairs? 0  Dressing or bathing? 0  Doing errands, shopping? 0  Preparing Food and eating ? N  Using the Toilet? N  In the past six months, have you accidently leaked urine? Y  Do you have problems with loss of bowel control? N  Managing your Medications? N  Managing your Finances? N  Housekeeping or managing your Housekeeping? N    Patient Care Team: Shelda Pal, DO as PCP - General (Family Medicine)  Indicate any recent Medical Services you may have received from other than [facility] providers in the past year (date may be approximate).     Assessment:   This is a routine wellness examination for [place].  Hearing/Vision screen No results found.  Dietary issues and exercise activities discussed: Current Exercise Habits: Home exercise routine, Type of exercise: walking, Time (Minutes): 30, Frequency (Times/Week): 7, Weekly Exercise (Minutes/Week): 210, Intensity: Mild, Exercise limited by: None identified   Goals Addressed   None    Depression Screen    09/18/2021    2:28 PM 09/17/2021    3:16 PM 03/27/2021    1:05 PM 09/17/2020    9:01 AM 07/15/2020    1:13 PM  PHQ 2/9 Scores  PHQ - 2 Score 0 0 0 2 0  PHQ- 9 Score  5  3 0    Fall Risk    09/18/2021    2:28 PM 09/17/2021    3:17 PM 03/27/2021    1:05 PM 09/17/2020    9:00 AM  [place] in the past year? 0 0 0 1  Number falls in past yr: 0 0 0 0  Injury with Fall? 0 0 0 0  Risk for fall due to : No Fall Risks No Fall Risks No Fall Risks No Fall Risks  Follow up Falls evaluation completed Falls evaluation completed Falls evaluation completed Falls evaluation completed    [place]:  Any stairs in or around the home? Yes  If so, are there any without handrails? No  Home free of loose throw rugs in walkways, pet beds, electrical cords, etc? Yes  Adequate lighting in your home to reduce risk of falls? Yes   ASSISTIVE DEVICES UTILIZED TO PREVENT FALLS:  Life alert? Yes  Use of a cane, walker or w/c? No  Grab bars in the bathroom? No  Shower chair or bench in shower? No  Elevated toilet seat or a handicapped toilet? Yes   TIMED UP AND GO:  Was the test performed? No .    Cognitive Function:        09/18/2021    2:30 PM  6CIT Screen  What Year? 0 points  What month? 0 points  What time? 0 points  Count back from 20 0 points  Months in reverse 2 points  Repeat phrase 0 points  Total Score 2 points    Immunizations Immunization History  Administered Date(s) Administered   Fluad Quad(high Dose 65+) 01/20/2021   Influenza, High Dose Seasonal PF 01/23/2020   PFIZER Comirnaty(Gray Top)Covid-19 Tri-Sucrose Vaccine 09/17/2020   PFIZER(Purple Top)SARS-COV-2 Vaccination 05/17/2019, 06/12/2019, 02/11/2020   Pfizer Covid-19  Vaccine Bivalent Booster 25yr & up 01/20/2021   Pneumococcal Conjugate-13 07/25/2013   Pneumococcal Polysaccharide-23 08/17/2010, 03/17/2020   Tdap 07/26/2011   Zoster Recombinat (Shingrix) 05/27/2021    TDAP status: Due, Education has been provided regarding the importance of this vaccine. Advised may receive this vaccine at local pharmacy or Health Dept. Aware to provide a copy of the vaccination record if obtained from local pharmacy or Health Dept. Verbalized acceptance and understanding.  Flu Vaccine status: Up to date  Pneumococcal vaccine status: Up to date  Covid-19 vaccine status: Completed vaccines  Qualifies for Shingles Vaccine? Yes   Zostavax completed No   Shingrix Completed?: No.    Education has been provided regarding the importance of this vaccine. Patient has been advised to call  insurance company to determine out of pocket expense if they have not yet received this vaccine. Advised may also receive vaccine at local pharmacy or Health Dept. Verbalized acceptance and understanding.  Screening Tests Health Maintenance  Topic Date Due   FOOT EXAM  09/30/2020   DEXA SCAN  01/22/2021   OPHTHALMOLOGY EXAM  02/18/2021   HEMOGLOBIN A1C  03/19/2021   Zoster Vaccines- Shingrix (2 of 2) 07/22/2021   TETANUS/TDAP  07/25/2021   INFLUENZA VACCINE  11/17/2021   MAMMOGRAM  01/06/2023   COLONOSCOPY (Pts 45-473yrInsurance coverage will need to be confirmed)  06/02/2026   Pneumonia Vaccine 6540Years old  Completed   COVID-19 Vaccine  Completed   Hepatitis C Screening  Completed   HPV VACCINES  Aged Out    Health Maintenance  Health Maintenance Due  Topic Date Due   FOOT EXAM  09/30/2020   DEXA SCAN  01/22/2021   OPHTHALMOLOGY EXAM  02/18/2021   HEMOGLOBIN A1C  03/19/2021   Zoster Vaccines- Shingrix (2 of 2) 07/22/2021   TETANUS/TDAP  07/25/2021    Colorectal cancer screening: Type of screening: Colonoscopy. Completed 06/02/16. Repeat every 10 years  Mammogram status: Completed 01/05/21. Repeat every year  Bone Density status: Completed 01/23/20. Results reflect: Bone density results: OSTEOPENIA. Repeat every 2 years.  Lung Cancer Screening: (Low Dose CT Chest recommended if Age 74-80ears, 30 pack-year currently smoking OR have quit w/in 15years.) does not qualify.   Lung Cancer Screening Referral: N/A  Additional Screening:  Hepatitis C Screening: does qualify; Completed 09/17/20  Vision Screening: Recommended annual ophthalmology exams for early detection of glaucoma and other disorders of the eye. Is the patient up to date with their annual eye exam?  Yes  Who is the provider or what is the name of the office in which the patient attends annual eye exams? [place] is not established with a provider, would they like to be referred to a provider to  establish care? No .   Dental Screening: Recommended annual dental exams for proper oral hygiene  Community Resource Referral / Chronic Care Management: CRR required this visit?  No   CCM required this visit?  No      Plan:     I have personally reviewed and noted the following in the patient's chart:   Medical and social history Use of alcohol, tobacco or illicit drugs  Current medications and supplements including opioid prescriptions. Patient is not currently taking opioid prescriptions. Functional ability and status Nutritional status Physical activity Advanced directives List of other physicians Hospitalizations, surgeries, and ER visits in previous 12 months Vitals Screenings to include cognitive, depression, and falls Referrals and appointments  In addition, I have reviewed and  discussed with patient certain preventive protocols, quality metrics, and best practice recommendations. A written personalized care plan for preventive services as well as general preventive health recommendations were provided to patient.   Due to this being a telephonic visit, the after visit summary with patients personalized plan was offered to patient via mail or my-chart.  Per request, patient was mailed a copy of AVS.  Duard Brady Ellarae Nevitt, [place]   09/18/2021   Nurse Notes: none   I have reviewed and agree with [facility] documentation.  Kathlene November, MD

## 2021-10-01 DIAGNOSIS — H5201 Hypermetropia, right eye: Secondary | ICD-10-CM | POA: Diagnosis not present

## 2021-10-01 DIAGNOSIS — Z961 Presence of intraocular lens: Secondary | ICD-10-CM | POA: Diagnosis not present

## 2021-10-01 DIAGNOSIS — H52221 Regular astigmatism, right eye: Secondary | ICD-10-CM | POA: Diagnosis not present

## 2021-10-01 DIAGNOSIS — H524 Presbyopia: Secondary | ICD-10-CM | POA: Diagnosis not present

## 2021-10-01 LAB — HM DIABETES EYE EXAM

## 2021-10-07 ENCOUNTER — Other Ambulatory Visit: Payer: Self-pay | Admitting: Family Medicine

## 2021-10-07 ENCOUNTER — Other Ambulatory Visit: Payer: Self-pay | Admitting: Cardiology

## 2021-10-07 ENCOUNTER — Encounter: Payer: Self-pay | Admitting: Family Medicine

## 2021-10-07 DIAGNOSIS — F411 Generalized anxiety disorder: Secondary | ICD-10-CM

## 2021-10-07 DIAGNOSIS — G4733 Obstructive sleep apnea (adult) (pediatric): Secondary | ICD-10-CM

## 2021-10-07 DIAGNOSIS — F321 Major depressive disorder, single episode, moderate: Secondary | ICD-10-CM

## 2021-10-07 DIAGNOSIS — R809 Proteinuria, unspecified: Secondary | ICD-10-CM

## 2021-10-08 NOTE — Telephone Encounter (Signed)
Requesting: clonazepam 0.'5mg'$  Contract: none UDS: none Last Visit: 09/17/21 Next Visit: none Last Refill: 04/08/21  Please Advise

## 2021-10-16 DIAGNOSIS — G4733 Obstructive sleep apnea (adult) (pediatric): Secondary | ICD-10-CM | POA: Diagnosis not present

## 2021-10-21 ENCOUNTER — Encounter: Payer: Self-pay | Admitting: Family Medicine

## 2021-10-27 DIAGNOSIS — H43812 Vitreous degeneration, left eye: Secondary | ICD-10-CM | POA: Diagnosis not present

## 2021-10-27 DIAGNOSIS — H35372 Puckering of macula, left eye: Secondary | ICD-10-CM | POA: Diagnosis not present

## 2021-10-27 DIAGNOSIS — Z961 Presence of intraocular lens: Secondary | ICD-10-CM | POA: Diagnosis not present

## 2021-10-30 ENCOUNTER — Encounter: Payer: Self-pay | Admitting: Family Medicine

## 2021-11-02 ENCOUNTER — Other Ambulatory Visit (HOSPITAL_BASED_OUTPATIENT_CLINIC_OR_DEPARTMENT_OTHER): Payer: Self-pay

## 2021-11-02 ENCOUNTER — Encounter: Payer: Self-pay | Admitting: Family Medicine

## 2021-11-02 ENCOUNTER — Ambulatory Visit (INDEPENDENT_AMBULATORY_CARE_PROVIDER_SITE_OTHER): Payer: Medicare Other | Admitting: Family Medicine

## 2021-11-02 VITALS — BP 120/80 | HR 85 | Temp 98.2°F | Ht 62.0 in | Wt 224.2 lb

## 2021-11-02 DIAGNOSIS — R5383 Other fatigue: Secondary | ICD-10-CM | POA: Diagnosis not present

## 2021-11-02 DIAGNOSIS — E1169 Type 2 diabetes mellitus with other specified complication: Secondary | ICD-10-CM

## 2021-11-02 DIAGNOSIS — E669 Obesity, unspecified: Secondary | ICD-10-CM | POA: Diagnosis not present

## 2021-11-02 MED ORDER — BOOSTRIX 5-2.5-18.5 LF-MCG/0.5 IM SUSY
PREFILLED_SYRINGE | INTRAMUSCULAR | 0 refills | Status: DC
Start: 2021-11-02 — End: 2022-01-06
  Filled 2021-11-02: qty 0.5, 1d supply, fill #0

## 2021-11-02 MED ORDER — SEMAGLUTIDE(0.25 OR 0.5MG/DOS) 2 MG/3ML ~~LOC~~ SOPN: 0.5000 mg | PEN_INJECTOR | SUBCUTANEOUS | 2 refills | Status: DC

## 2021-11-02 MED ORDER — SHINGRIX 50 MCG/0.5ML IM SUSR
INTRAMUSCULAR | 0 refills | Status: DC
Start: 2021-11-02 — End: 2022-04-21
  Filled 2021-11-02: qty 1, 1d supply, fill #0

## 2021-11-02 NOTE — Progress Notes (Signed)
Subjective:   Chief Complaint  Patient presents with   Fatigue    Kara Lee is a 76 y.o. female here for follow-up of diabetes.   Kara Lee does not monitor her sugars.  Patient does require insulin.  Lantus 10 units nightly Medications include: Ozempic 0.5 mg weekly, metformin XR 500 mg twice daily Diet is healthy, improved.  Exercise: none No Cp or SOB.  Fatigue Over the past several months, the patient has felt like sleeping all day.  She has a history of sleep apnea and is usually compliant with her CPAP.  She does not notice a difference when she uses it versus does not use her machine.  She does have some depression and life stressors associated with her brother-in-law.  Diet and exercise as above.  Past Medical History:  Diagnosis Date   Anxiety    Complication of anesthesia    Concussion November 09, 2012   From a fall at home on deck   Depression    postpartum depression after 2nd child   Diabetes mellitus ([place])    Fatty liver    GERD (gastroesophageal reflux disease)    HLD (hyperlipidemia)    Hypertension    Morbid obesity (HCC)    OSA on CPAP    uses CPAP nightly   Osteoarthritis of right knee 07/31/2013   PONV (postoperative nausea and vomiting)    Rosacea    Severe aortic stenosis      Related testing: Retinal exam: Done Pneumovax: done  Objective:  BP 120/80   Pulse 85   Temp 98.2 F (36.8 C) (Oral)   Ht '5\' 2"'$  (1.575 m)   Wt 224 lb 4 oz (101.7 kg)   SpO2 95%   BMI 41.02 kg/m  General:  Well developed, well nourished, in no apparent distress Skin:  Warm, no pallor or diaphoresis Head:  Normocephalic, atraumatic Eyes:  Pupils equal and round, sclera anicteric without injection  Lungs:  CTAB, no access msc use Cardio:  RRR, no bruits, no LE edema Musculoskeletal:  Symmetrical muscle groups noted without atrophy or deformity Neuro:  Sensation intact to pinprick on feet Psych: Age appropriate judgment and insight  Assessment:   Diabetes mellitus  type 2 in obese ([place]) - Plan: Semaglutide,0.25 or 0.'5MG'$ /DOS, 2 MG/3ML SOPN, Comprehensive metabolic panel, Hemoglobin A1c, Lipid panel  Fatigue, unspecified type - Plan: CBC, TSH   Plan:   Chronic, stable. Cont Ozempic 0.5 mg/week, metformin XR 500 mg twice daily, Lantus 10 units daily. Counseled on diet and exercise. Chronic, unstable.  Check above labs.  Sleep apnea appears to be overall controlled.  If she has normal labs, I will change her Prozac to Lexapro 20 mg daily.  If they are abnormal, we will treat the abnormality. F/u pending the above The patient voiced understanding and agreement to the plan.  [name], DO 11/02/21 5:05 PM

## 2021-11-02 NOTE — Patient Instructions (Addendum)
Ozempic has been refilled.   Keep the diet clean and stay active.  Aim to do some physical exertion for 150 minutes per week. This is typically divided into 5 days per week, 30 minutes per day. The activity should be enough to get your heart rate up. Anything is better than nothing if you have time constraints.  Give [place] 2-3 business days to get the results of your labs back.   It is time to get your second shingles shot. Inquire about a tetanus shot as well. I would wait 2 weeks after your cortisone shot.   Let us know if you need anything.

## 2021-11-03 ENCOUNTER — Other Ambulatory Visit: Payer: Self-pay | Admitting: Physical Medicine and Rehabilitation

## 2021-11-03 ENCOUNTER — Encounter: Payer: Self-pay | Admitting: Family Medicine

## 2021-11-03 ENCOUNTER — Other Ambulatory Visit: Payer: Self-pay | Admitting: Family Medicine

## 2021-11-03 DIAGNOSIS — M5134 Other intervertebral disc degeneration, thoracic region: Secondary | ICD-10-CM

## 2021-11-03 DIAGNOSIS — M5414 Radiculopathy, thoracic region: Secondary | ICD-10-CM | POA: Diagnosis not present

## 2021-11-03 DIAGNOSIS — M542 Cervicalgia: Secondary | ICD-10-CM | POA: Diagnosis not present

## 2021-11-03 DIAGNOSIS — M545 Low back pain, unspecified: Secondary | ICD-10-CM | POA: Diagnosis not present

## 2021-11-03 DIAGNOSIS — M546 Pain in thoracic spine: Secondary | ICD-10-CM | POA: Diagnosis not present

## 2021-11-03 LAB — COMPREHENSIVE METABOLIC PANEL
ALT: 24 U/L (ref 0–35)
AST: 31 U/L (ref 0–37)
Albumin: 4.4 g/dL (ref 3.5–5.2)
Alkaline Phosphatase: 58 U/L (ref 39–117)
BUN: 14 mg/dL (ref 6–23)
CO2: 24 mEq/L (ref 19–32)
Calcium: 9.8 mg/dL (ref 8.4–10.5)
Chloride: 102 mEq/L (ref 96–112)
Creatinine, Ser: 0.66 mg/dL (ref 0.40–1.20)
GFR: 86.87 mL/min (ref >60.00)
Glucose, Bld: 120 mg/dL — ABNORMAL HIGH (ref 70–99)
Potassium: 4.5 mEq/L (ref 3.5–5.1)
Sodium: 135 mEq/L (ref 135–145)
Total Bilirubin: 0.4 mg/dL (ref 0.2–1.2)
Total Protein: 7.6 g/dL (ref 6.0–8.3)

## 2021-11-03 LAB — TSH: TSH: 1.2 u[IU]/mL (ref 0.35–5.50)

## 2021-11-03 LAB — LIPID PANEL
Cholesterol: 138 mg/dL (ref 0–200)
HDL: 53 mg/dL (ref >39.00)
LDL Cholesterol: 62 mg/dL (ref 0–99)
NonHDL: 85.44
Total CHOL/HDL Ratio: 3
Triglycerides: 116 mg/dL (ref 0.0–149.0)
VLDL: 23.2 mg/dL (ref 0.0–40.0)

## 2021-11-03 LAB — CBC
HCT: 37.3 % (ref 36.0–46.0)
Hemoglobin: 12.5 g/dL (ref 12.0–15.0)
MCHC: 33.6 g/dL (ref 30.0–36.0)
MCV: 90.4 fl (ref 78.0–100.0)
Platelets: 195 10*3/uL (ref 150.0–400.0)
RBC: 4.13 Mil/uL (ref 3.87–5.11)
RDW: 13.5 % (ref 11.5–15.5)
WBC: 10.7 10*3/uL — ABNORMAL HIGH (ref 4.0–10.5)

## 2021-11-03 LAB — HEMOGLOBIN A1C: Hgb A1c MFr Bld: 7.1 % — ABNORMAL HIGH (ref 4.6–6.5)

## 2021-11-03 MED ORDER — ESCITALOPRAM OXALATE 20 MG PO TABS: 20.0000 mg | ORAL_TABLET | Freq: Every day | ORAL | 2 refills | Status: DC

## 2021-11-04 ENCOUNTER — Encounter: Payer: Self-pay | Admitting: Family Medicine

## 2021-11-15 DIAGNOSIS — G4733 Obstructive sleep apnea (adult) (pediatric): Secondary | ICD-10-CM | POA: Diagnosis not present

## 2021-11-17 ENCOUNTER — Inpatient Hospital Stay
Admission: RE | Admit: 2021-11-17 | Discharge: 2021-11-17 | Disposition: A | Discharge status: Home / Self Care | Payer: Medicare Other | Source: Ambulatory Visit | Attending: Physical Medicine and Rehabilitation | Admitting: Physical Medicine and Rehabilitation

## 2021-11-17 NOTE — Discharge Instructions (Signed)

## 2021-11-19 ENCOUNTER — Ambulatory Visit
Admission: RE | Admit: 2021-11-19 | Discharge: 2021-11-19 | Disposition: A | Discharge status: Home / Self Care | Payer: Medicare Other | Source: Ambulatory Visit | Attending: Physical Medicine and Rehabilitation | Admitting: Physical Medicine and Rehabilitation

## 2021-11-19 DIAGNOSIS — M5134 Other intervertebral disc degeneration, thoracic region: Secondary | ICD-10-CM

## 2021-11-19 DIAGNOSIS — M47814 Spondylosis without myelopathy or radiculopathy, thoracic region: Secondary | ICD-10-CM | POA: Diagnosis not present

## 2021-11-19 MED ORDER — TRIAMCINOLONE ACETONIDE 40 MG/ML IJ SUSP (RADIOLOGY)
60.0000 mg | Freq: Once | INTRAMUSCULAR | Status: AC
  Administered 2021-11-19: 60 mg via EPIDURAL

## 2021-11-19 MED ORDER — IOPAMIDOL (ISOVUE-M 300) INJECTION 61%
1.0000 mL | Freq: Once | INTRAMUSCULAR | Status: AC
  Administered 2021-11-19: 1 mL via EPIDURAL

## 2021-11-19 NOTE — Discharge Instructions (Signed)

## 2021-12-02 ENCOUNTER — Ambulatory Visit: Payer: Medicare Other | Admitting: Family Medicine

## 2021-12-14 DIAGNOSIS — G4733 Obstructive sleep apnea (adult) (pediatric): Secondary | ICD-10-CM | POA: Diagnosis not present

## 2021-12-16 DIAGNOSIS — E118 Type 2 diabetes mellitus with unspecified complications: Secondary | ICD-10-CM | POA: Diagnosis not present

## 2021-12-16 DIAGNOSIS — R809 Proteinuria, unspecified: Secondary | ICD-10-CM | POA: Diagnosis not present

## 2021-12-16 DIAGNOSIS — E785 Hyperlipidemia, unspecified: Secondary | ICD-10-CM | POA: Diagnosis not present

## 2021-12-16 DIAGNOSIS — E538 Deficiency of other specified B group vitamins: Secondary | ICD-10-CM | POA: Diagnosis not present

## 2021-12-16 DIAGNOSIS — K760 Fatty (change of) liver, not elsewhere classified: Secondary | ICD-10-CM | POA: Diagnosis not present

## 2021-12-18 ENCOUNTER — Ambulatory Visit: Payer: Medicare Other | Admitting: Family Medicine

## 2021-12-18 DIAGNOSIS — G4733 Obstructive sleep apnea (adult) (pediatric): Secondary | ICD-10-CM | POA: Diagnosis not present

## 2021-12-23 ENCOUNTER — Encounter: Payer: Self-pay | Admitting: Family Medicine

## 2021-12-23 ENCOUNTER — Ambulatory Visit (INDEPENDENT_AMBULATORY_CARE_PROVIDER_SITE_OTHER): Payer: Medicare Other

## 2021-12-23 DIAGNOSIS — Z23 Encounter for immunization: Secondary | ICD-10-CM | POA: Diagnosis not present

## 2021-12-23 NOTE — Progress Notes (Signed)
Kara Lee is a 76 y.o. female presents to the office today for Flu Shot per physician's orders. Original order  (route) was administered left  (location) today. Patient tolerated it well   Adriana Mccallum [place]

## 2021-12-30 DIAGNOSIS — N905 Atrophy of vulva: Secondary | ICD-10-CM | POA: Diagnosis not present

## 2021-12-30 DIAGNOSIS — N898 Other specified noninflammatory disorders of vagina: Secondary | ICD-10-CM | POA: Diagnosis not present

## 2021-12-30 DIAGNOSIS — Z1151 Encounter for screening for human papillomavirus (HPV): Secondary | ICD-10-CM | POA: Diagnosis not present

## 2021-12-30 DIAGNOSIS — Z01411 Encounter for gynecological examination (general) (routine) with abnormal findings: Secondary | ICD-10-CM | POA: Diagnosis not present

## 2021-12-30 DIAGNOSIS — R21 Rash and other nonspecific skin eruption: Secondary | ICD-10-CM | POA: Diagnosis not present

## 2021-12-30 DIAGNOSIS — Z124 Encounter for screening for malignant neoplasm of cervix: Secondary | ICD-10-CM | POA: Diagnosis not present

## 2022-01-04 ENCOUNTER — Ambulatory Visit: Payer: Medicare Other | Admitting: Family Medicine

## 2022-01-04 ENCOUNTER — Other Ambulatory Visit: Payer: Self-pay | Admitting: Cardiology

## 2022-01-06 ENCOUNTER — Ambulatory Visit: Payer: Medicare Other | Admitting: Family Medicine

## 2022-01-06 ENCOUNTER — Encounter: Payer: Self-pay | Admitting: Family Medicine

## 2022-01-06 VITALS — BP 118/65 | HR 76 | Temp 98.0°F | Resp 16 | Ht 62.0 in | Wt 217.8 lb

## 2022-01-06 DIAGNOSIS — E1169 Type 2 diabetes mellitus with other specified complication: Secondary | ICD-10-CM

## 2022-01-06 DIAGNOSIS — F321 Major depressive disorder, single episode, moderate: Secondary | ICD-10-CM | POA: Diagnosis not present

## 2022-01-06 DIAGNOSIS — F411 Generalized anxiety disorder: Secondary | ICD-10-CM | POA: Diagnosis not present

## 2022-01-06 DIAGNOSIS — E669 Obesity, unspecified: Secondary | ICD-10-CM

## 2022-01-06 DIAGNOSIS — K635 Polyp of colon: Secondary | ICD-10-CM

## 2022-01-06 MED ORDER — CLONAZEPAM 0.5 MG PO TABS: ORAL_TABLET | ORAL | 1 refills | Status: DC

## 2022-01-06 MED ORDER — FLUOXETINE HCL 40 MG PO CAPS: 40.0000 mg | ORAL_CAPSULE | Freq: Every day | ORAL | 2 refills | Status: DC

## 2022-01-06 NOTE — Progress Notes (Signed)
Chief Complaint  Patient presents with   Medication Refill    Here for medication refill and discuss test    Subjective: Patient is a 76 y.o. female here for a myriad of questions.  Pt wants to know if she has to get her mammogram at a specific location. No hx of abn mammograms.   Patient had a colonoscopy done in 2018 where she had a very bad experience.  They may have seen a polyp but were unable to get a biopsy of it.  She had incomplete colon prep.  When they drew an IV, the line came out and she reportedly sprayed blood everywhere. Had it done w a Dr. Dorrene German of [facility].    Pt would like her sugars monitored at our office now. Last A1c was 6.8. Diet is fair.   Past Medical History:  Diagnosis Date   Anxiety    Complication of anesthesia    Concussion November 09, 2012   From a fall at home on deck   Depression    postpartum depression after 2nd child   Diabetes mellitus ([place])    Fatty liver    GERD (gastroesophageal reflux disease)    HLD (hyperlipidemia)    Hypertension    Morbid obesity (HCC)    OSA on CPAP    uses CPAP nightly   Osteoarthritis of right knee 07/31/2013   PONV (postoperative nausea and vomiting)    Rosacea    Severe aortic stenosis     Objective: BP 118/65 (BP Location: Right Arm, Patient Position: Sitting, Cuff Size: Normal)   Pulse 76   Temp 98 F (36.7 C) (Oral)   Resp 16   Ht $R'5\' 2"'oa$  (1.575 m)   Wt 217 lb 12.8 oz (98.8 kg)   SpO2 96%   BMI 39.84 kg/m  General: Awake, appears stated age Heart: RRR, no LE edema Lungs: CTAB, no rales, wheezes or rhonchi. No accessory muscle use Psych: Age appropriate judgment and insight, normal affect and mood  Assessment and Plan: Polyp of colon, unspecified part of colon, unspecified type - Plan: Ambulatory referral to Gastroenterology  Depression, major, single episode, moderate ([place]) - Plan: clonazePAM (KLONOPIN) 0.5 MG tablet  GAD (generalized anxiety disorder) - Plan: clonazePAM (KLONOPIN) 0.5 MG  tablet  Diabetes mellitus type 2 in obese ([place]) - Plan: Microalbumin / creatinine urine ratio  Discussed she should have another colonoscopy in light of this info. Does not wish to go back to [facility]. Will set up with [facility].  Cont Prozac. Prozac, Klonopin prn.  She has been taking clonazepam half a tab twice daily scheduled rather than as needed.  She would like to wean down.  I will have her take 1/2 tab daily for the next week and then do not take unless she needs. Happy to start monitoring her sugars.  We will continue her current medication regimen of Lantus 10 units nightly, metformin XR 1000 mg daily, Ozempic 0.5 mg weekly.  We will check a microalbumin creatinine ratio today. Follow-up in 4 months for a physical. The patient voiced understanding and agreement to the plan.  I spent 30 min w the pt discussing the above plans in addition to reviewing her chart on the same day of the visit.   [place], DO 01/06/22  2:53 PM

## 2022-01-06 NOTE — Patient Instructions (Addendum)
If you do not hear anything about your referral in the next 1-2 weeks, call our office and ask for an update.  Take the Klonopin daily from twice daily for 1 week and then stop and only take as needed.   Let us know if you need anything.

## 2022-01-08 ENCOUNTER — Other Ambulatory Visit: Payer: Self-pay | Admitting: Physical Medicine and Rehabilitation

## 2022-01-08 DIAGNOSIS — M5134 Other intervertebral disc degeneration, thoracic region: Secondary | ICD-10-CM

## 2022-01-14 DIAGNOSIS — G4733 Obstructive sleep apnea (adult) (pediatric): Secondary | ICD-10-CM | POA: Diagnosis not present

## 2022-01-18 ENCOUNTER — Ambulatory Visit
Admission: RE | Admit: 2022-01-18 | Discharge: 2022-01-18 | Disposition: A | Discharge status: Home / Self Care | Payer: Medicare Other | Source: Ambulatory Visit | Attending: Physical Medicine and Rehabilitation | Admitting: Physical Medicine and Rehabilitation

## 2022-01-18 DIAGNOSIS — M47814 Spondylosis without myelopathy or radiculopathy, thoracic region: Secondary | ICD-10-CM | POA: Diagnosis not present

## 2022-01-18 DIAGNOSIS — M5134 Other intervertebral disc degeneration, thoracic region: Secondary | ICD-10-CM

## 2022-01-18 MED ORDER — IOPAMIDOL (ISOVUE-M 300) INJECTION 61%
1.0000 mL | Freq: Once | INTRAMUSCULAR | Status: AC | PRN
  Administered 2022-01-18: 1 mL via EPIDURAL

## 2022-01-18 MED ORDER — TRIAMCINOLONE ACETONIDE 40 MG/ML IJ SUSP (RADIOLOGY)
60.0000 mg | Freq: Once | INTRAMUSCULAR | Status: AC
  Administered 2022-01-18: 60 mg via EPIDURAL

## 2022-01-18 NOTE — Discharge Instructions (Signed)

## 2022-01-19 ENCOUNTER — Encounter: Payer: Self-pay | Admitting: Family Medicine

## 2022-01-19 DIAGNOSIS — K219 Gastro-esophageal reflux disease without esophagitis: Secondary | ICD-10-CM

## 2022-01-19 DIAGNOSIS — E1169 Type 2 diabetes mellitus with other specified complication: Secondary | ICD-10-CM

## 2022-01-20 ENCOUNTER — Ambulatory Visit: Payer: Medicare Other | Admitting: Family Medicine

## 2022-01-20 MED ORDER — OMEPRAZOLE 40 MG PO CPDR: 40.0000 mg | DELAYED_RELEASE_CAPSULE | Freq: Every day | ORAL | 3 refills | Status: DC

## 2022-01-20 NOTE — Telephone Encounter (Signed)
Please see the MyChart message reply(ies) for my assessment and plan.  The patient gave consent for this Medical Advice Message and is aware that it may result in a bill to their insurance company as well as the possibility that this may result in a co-payment or deductible. They are an established patient, but are not seeking medical advice exclusively about a problem treated during an in person or video visit in the last 7 days. I did not recommend an in person or video visit within 7 days of my reply.  I spent a total of 10 minutes cumulative time within 7 days through [employer], DO

## 2022-01-22 ENCOUNTER — Ambulatory Visit: Payer: Medicare Other | Admitting: Family Medicine

## 2022-01-22 ENCOUNTER — Encounter: Payer: Self-pay | Admitting: Family Medicine

## 2022-01-22 VITALS — BP 110/76 | HR 69 | Temp 97.4°F | Ht 62.0 in | Wt 213.0 lb

## 2022-01-22 DIAGNOSIS — E669 Obesity, unspecified: Secondary | ICD-10-CM | POA: Diagnosis not present

## 2022-01-22 DIAGNOSIS — K219 Gastro-esophageal reflux disease without esophagitis: Secondary | ICD-10-CM | POA: Diagnosis not present

## 2022-01-22 DIAGNOSIS — E1169 Type 2 diabetes mellitus with other specified complication: Secondary | ICD-10-CM | POA: Diagnosis not present

## 2022-01-22 NOTE — Progress Notes (Signed)
Chief Complaint  Patient presents with   Follow-up    Medications     Subjective: Patient is a 76 y.o. female here for GERD.  4 days ago, the patient had a back injection.  She ate a biscuit and coffee and immediately experienced burning in her chest region.  She was taking Nexium daily.  She took Tums without relief.  She is having a hoarse voice and throat clearing since then.  She also has mild pain in the epigastric region.  She denies any difficulty swallowing, bleeding, or bowel changes.  She is taking Ozempic and currently on 0.25 mg weekly.  She has lost almost 20 pounds since starting it.  She is compliant and tolerating it well.  She is wondering if this is related to her above issues.  She started this 4 months ago.  Past Medical History:  Diagnosis Date   Anxiety    Complication of anesthesia    Concussion November 09, 2012   From a fall at home on deck   Depression    postpartum depression after 2nd child   Diabetes mellitus ([place])    Fatty liver    GERD (gastroesophageal reflux disease)    HLD (hyperlipidemia)    Hypertension    Morbid obesity (HCC)    OSA on CPAP    uses CPAP nightly   Osteoarthritis of right knee 07/31/2013   PONV (postoperative nausea and vomiting)    Rosacea    Severe aortic stenosis     Objective: BP 110/76 (BP Location: Left Arm, Patient Position: Standing, Cuff Size: Normal)   Pulse 69   Temp (!) 97.4 F (36.3 C) (Oral)   Ht '5\' 2"'$  (1.575 m)   Wt 213 lb (96.6 kg)   SpO2 98%   BMI 38.96 kg/m  General: Awake, appears stated age Heart: RRR, no LE edema Lungs: CTAB, no rales, wheezes or rhonchi. 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  Change Nexium to omeprazole 40 mg daily.  She will send me a message in 1 week if her symptoms are still not controlled and we will increase to twice daily.  Reflux precautions discussed.  Pepcid/Tums as needed. Stable but she is on Ozempic 0.25 mg weekly.  We will not change this for now. Chronic, unstable.  If her reflux symptoms improve on omeprazole, we will increase her doses of Ozempic from 0.25 mg weekly to 0.5 mg weekly. The patient voiced understanding and agreement to the plan.  Snyder, DO 01/22/22  3:11 PM

## 2022-01-22 NOTE — Patient Instructions (Addendum)
The only lifestyle changes that have data behind them are weight loss for the overweight/obese and elevating the head of the bed. Finding out which foods/positions are triggers is important.  Take the omeprazole 40 mg daily, send me a message in 1 week either way.   Take Pepcid and/or Tums as needed.  Stay active.  Stay on the [place] for now.   Let us know if you need anything.

## 2022-01-25 DIAGNOSIS — G4733 Obstructive sleep apnea (adult) (pediatric): Secondary | ICD-10-CM | POA: Diagnosis not present

## 2022-01-25 DIAGNOSIS — J309 Allergic rhinitis, unspecified: Secondary | ICD-10-CM | POA: Diagnosis not present

## 2022-01-28 ENCOUNTER — Encounter: Payer: Self-pay | Admitting: Family Medicine

## 2022-01-31 ENCOUNTER — Encounter: Payer: Self-pay | Admitting: Family Medicine

## 2022-02-03 DIAGNOSIS — I10 Essential (primary) hypertension: Secondary | ICD-10-CM | POA: Diagnosis not present

## 2022-02-03 DIAGNOSIS — E119 Type 2 diabetes mellitus without complications: Secondary | ICD-10-CM | POA: Diagnosis not present

## 2022-02-03 DIAGNOSIS — H35372 Puckering of macula, left eye: Secondary | ICD-10-CM | POA: Diagnosis not present

## 2022-02-03 DIAGNOSIS — H43392 Other vitreous opacities, left eye: Secondary | ICD-10-CM | POA: Diagnosis not present

## 2022-02-04 DIAGNOSIS — Z1231 Encounter for screening mammogram for malignant neoplasm of breast: Secondary | ICD-10-CM | POA: Diagnosis not present

## 2022-02-04 DIAGNOSIS — R92323 Mammographic fibroglandular density, bilateral breasts: Secondary | ICD-10-CM | POA: Diagnosis not present

## 2022-02-04 LAB — HM MAMMOGRAPHY

## 2022-02-05 ENCOUNTER — Telehealth: Payer: Self-pay | Admitting: Family Medicine

## 2022-02-05 ENCOUNTER — Other Ambulatory Visit (HOSPITAL_BASED_OUTPATIENT_CLINIC_OR_DEPARTMENT_OTHER): Payer: Self-pay

## 2022-02-05 DIAGNOSIS — E1169 Type 2 diabetes mellitus with other specified complication: Secondary | ICD-10-CM

## 2022-02-05 MED ORDER — SEMAGLUTIDE(0.25 OR 0.5MG/DOS) 2 MG/3ML ~~LOC~~ SOPN
0.5000 mg | PEN_INJECTOR | SUBCUTANEOUS | 2 refills | Status: DC
  Filled 2022-02-05: qty 3, 28d supply, fill #0

## 2022-02-05 NOTE — Telephone Encounter (Signed)
Pt called stating that [facility] is unable to fill her Ozempic Rx. Pt would like to have it rerouted to the following pharmacy:  Medication: Semaglutide,0.25 or 0.'5MG'$ /DOS, 2 MG/3ML SOPN [[id number]]   Has the patient contacted their pharmacy? No. (If no, request that the patient contact the pharmacy for the refill.) (If yes, when and what did the pharmacy advise?)  Preferred Pharmacy (with phone number or street name):   [facility] [address] [place], [place] [address] Phone: [phone]  Fax: [phone]    Agent: Please be advised that RX refills may take up to 3 business days. We ask that you follow-up with your pharmacy.

## 2022-02-05 NOTE — Telephone Encounter (Signed)
Sent in to [facility]

## 2022-02-07 ENCOUNTER — Encounter: Payer: Self-pay | Admitting: Family Medicine

## 2022-02-07 DIAGNOSIS — E1169 Type 2 diabetes mellitus with other specified complication: Secondary | ICD-10-CM

## 2022-02-08 ENCOUNTER — Encounter: Payer: Self-pay | Admitting: Family Medicine

## 2022-02-08 ENCOUNTER — Other Ambulatory Visit (HOSPITAL_BASED_OUTPATIENT_CLINIC_OR_DEPARTMENT_OTHER): Payer: Self-pay

## 2022-02-08 MED ORDER — SEMAGLUTIDE(0.25 OR 0.5MG/DOS) 2 MG/3ML ~~LOC~~ SOPN
0.5000 mg | PEN_INJECTOR | SUBCUTANEOUS | 2 refills | Status: DC
  Filled 2022-02-08 (×2): qty 3, 28d supply, fill #0

## 2022-02-08 MED ORDER — COMIRNATY 30 MCG/0.3ML IM SUSY
PREFILLED_SYRINGE | INTRAMUSCULAR | 0 refills | Status: DC
  Filled 2022-02-08: qty 0.3, 1d supply, fill #0

## 2022-02-09 DIAGNOSIS — H35372 Puckering of macula, left eye: Secondary | ICD-10-CM | POA: Diagnosis not present

## 2022-02-09 DIAGNOSIS — Z961 Presence of intraocular lens: Secondary | ICD-10-CM | POA: Diagnosis not present

## 2022-02-13 DIAGNOSIS — G4733 Obstructive sleep apnea (adult) (pediatric): Secondary | ICD-10-CM | POA: Diagnosis not present

## 2022-02-14 ENCOUNTER — Encounter: Payer: Self-pay | Admitting: Family Medicine

## 2022-02-22 NOTE — Progress Notes (Deleted)
FOLLOW UP Date of Service/Encounter:  02/22/22   Subjective:  Kara Lee (DOB: 04-17-1946) is a 76 y.o. female with OSA who returns to the [facility] on 02/23/2022 in re-evaluation of the following: Chronic urticaria, allergic rhinitis History obtained from: chart review and {Persons; PED relatives w/patient:19415::"patient"}.  For Review, LV was on 09/07/21  with Dr.Mellissa Conley seen for routine follow-up. She had been approved for Xolair, but was controlled with 1 Zyrtec tablet twice daily and Pepcid twice daily.  Experiencing some mild sedation with Zyrtec, so we discussed trial of Xyzal.  Pertinent History/Diagnostics:  Allergic Rhinitis:  - labs environmental panel (04/30/21): + tree pollen, detectable grass and weed pollen below traditional threshold for positive Chronic hives:  Claritin ineffective.  Zyrtec and Allegra cause mild sedation. - CU labs: CU index elevated (11.6), alpha gal negative, negative thryoid antibodies, normal baseline tryptase (8.2) Shellfish intolerance she can eat a small portion of popcorn shrimp without issue but if she eats a large portion she does get itchy.  Thus she tries to avoid.  She states she can eat fish like flounder without issue.  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  Carries an Epipen.  Today presents for follow-up. ***  Allergies as of 02/23/2022       Reactions   Bee Venom Itching, Swelling   Latex Itching, Swelling   Lovenox [enoxaparin Sodium] Other (See Comments)   Bruising   Minocycline Nausea And Vomiting   Morphine And Related Nausea And Vomiting   Nickel Other (See Comments)   Contact dermatitis    Shellfish Allergy Itching   Statins    Muscle aches   Sulfa Antibiotics Nausea And Vomiting   Tape Itching   Bandaids and adhesive tape   Cipro [ciprofloxacin Hcl] Rash   Ciprofloxacin Rash   Glimepiride Rash   Metronidazole Rash        Medication List        Accurate as of  February 22, 2022  1:09 PM. If you have any questions, ask your nurse or doctor.          acetaminophen 650 MG CR tablet Commonly known as: TYLENOL Take 1,300 mg by mouth every 8 (eight) hours as needed for pain.   aspirin EC 81 MG tablet Take 81 mg by mouth at bedtime.   atenolol 25 MG tablet Commonly known as: TENORMIN TAKE 1 TABLET(25 MG) BY MOUTH TWICE DAILY   cetirizine 10 MG tablet Commonly known as: ZYRTEC Take 10 mg by mouth daily.   Comirnaty syringe Generic drug: COVID-19 mRNA vaccine 2023-2024 Inject into the muscle.   EPINEPHrine 0.3 mg/0.3 mL Soaj injection Commonly known as: EpiPen 2-Pak Inject 0.3 mg into the muscle as needed for anaphylaxis.   estradiol 0.1 MG/GM vaginal cream Commonly known as: ESTRACE Place 1 Applicatorful vaginally as needed (irritation).   famotidine 40 MG tablet Commonly known as: PEPCID Take 40 mg by mouth daily.   FLUoxetine 40 MG capsule Commonly known as: PROZAC Take 1 capsule (40 mg total) by mouth daily.   fluticasone 50 MCG/ACT nasal spray Commonly known as: FLONASE Place 1 spray into both nostrils daily as needed for allergies.   insulin glargine 100 UNIT/ML injection Commonly known as: LANTUS Inject 10 Units into the skin daily. Sliding scale whil on prednisone   losartan 25 MG tablet Commonly known as: COZAAR TAKE 1 TABLET(25 MG) BY MOUTH DAILY   metFORMIN 500 MG 24 hr tablet Commonly known as: GLUCOPHAGE-XR TAKE  2 TABLETS(1000 MG) BY MOUTH DAILY What changed: See the new instructions.   METHOCARBAMOL PO Take by mouth as needed (for muscle spasms).   omeprazole 40 MG capsule Commonly known as: PRILOSEC Take 1 capsule (40 mg total) by mouth daily.   ondansetron 4 MG disintegrating tablet Commonly known as: ZOFRAN-ODT Take 1 tablet (4 mg total) by mouth every 8 (eight) hours as needed for nausea or vomiting.   Propylene Glycol 0.6 % Soln Place 1 drop into both eyes daily as needed (dry eyes).    Semaglutide(0.25 or 0.'5MG'$ /DOS) 2 MG/3ML Sopn Inject 0.5 mg into the skin once a week.   Shingrix injection Generic drug: Zoster Vaccine Adjuvanted Inject into the muscle.   triamcinolone cream 0.1 % Commonly known as: KENALOG Apply to itchy areas on back twice daily.   Zypitamag 4 MG Tabs Generic drug: Pitavastatin Magnesium Take 1 tablet by mouth daily. What changed: additional instructions       Past Medical History:  Diagnosis Date   Anxiety    Complication of anesthesia    Concussion November 09, 2012   From a fall at home on deck   Depression    postpartum depression after 2nd child   Diabetes mellitus ([place])    Fatty liver    GERD (gastroesophageal reflux disease)    HLD (hyperlipidemia)    Hypertension    Morbid obesity (HCC)    OSA on CPAP    uses CPAP nightly   Osteoarthritis of right knee 07/31/2013   PONV (postoperative nausea and vomiting)    Rosacea    Severe aortic stenosis    Past Surgical History:  Procedure Laterality Date   ABDOMINAL HYSTERECTOMY     still has uterus   ANKLE RECONSTRUCTION Left    ARTERY BIOPSY Left 01/21/2020   Procedure: BIOPSY TEMPORAL ARTERY;  Surgeon: Leta Baptist, MD;  Location: [facility];  Service: ENT;  Laterality: Left;   BIOPSY BREAST Bilateral    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;  Service: Orthopedics;  Laterality: Right;   RIGHT/LEFT HEART CATH AND CORONARY ANGIOGRAPHY N/A 10/04/2017   Procedure: RIGHT/LEFT HEART CATH AND CORONARY ANGIOGRAPHY;  Surgeon: Adrian Prows, MD;  Location: [facility];  Service: Cardiovascular;  Laterality: N/A;   TEE WITHOUT CARDIOVERSION N/A 08/31/2017   Procedure: TRANSESOPHAGEAL  ECHOCARDIOGRAM (TEE);  Surgeon: Adrian Prows, MD;  Location: [facility];  Service: Cardiovascular;  Laterality: N/A;   TEE WITHOUT CARDIOVERSION N/A 01/30/2019   Procedure: TRANSESOPHAGEAL ECHOCARDIOGRAM (TEE);  Surgeon: Sherren Mocha, MD;  Location: [facility];  Service: Open Heart Surgery;  Laterality: N/A;   TRANSCATHETER AORTIC VALVE REPLACEMENT, TRANSFEMORAL N/A 01/30/2019   Procedure: TRANSCATHETER AORTIC VALVE REPLACEMENT, TRANSFEMORAL;  Surgeon: Sherren Mocha, MD;  Location: [facility];  Service: Open Heart Surgery;  Laterality: N/A;   TUBAL LIGATION     Otherwise, there have been no changes to her past medical history, surgical history, family history, or social history.  ROS: All others negative except as noted per HPI.   Objective:  There were no vitals taken for this visit. There  is no height or weight on file to calculate BMI. Physical Exam: General Appearance:  Alert, cooperative, no distress, appears stated age  Head:  Normocephalic, without obvious abnormality, atraumatic  Eyes:  Conjunctiva clear, EOM's intact  Nose: Nares normal, {Blank multiple:19196:a:"***","hypertrophic turbinates","normal mucosa","no visible anterior polyps","septum midline"}  Throat: Lips, tongue normal; teeth and gums normal, {Blank multiple:19196:a:"***","normal posterior oropharynx","tonsils 2+","tonsils 3+","no tonsillar exudate","+ cobblestoning"}  Neck: Supple, symmetrical  Lungs:   {Blank multiple:19196:a:"***","clear to auscultation bilaterally","end-expiratory wheezing","wheezing throughout"}, Respirations unlabored, {Blank multiple:19196:a:"***","no coughing","intermittent dry coughing"}  Heart:  {Blank multiple:19196:a:"***","regular rate and rhythm","no murmur"}, Appears well perfused  Extremities: No edema  Skin: Skin color, texture, turgor normal, no rashes or lesions on visualized portions of skin  Neurologic: No gross deficits   Reviewed: ***  Spirometry:   Tracings reviewed. Her effort: {Blank single:19197::"Good reproducible efforts.","It was hard to get consistent efforts and there is a question as to whether this reflects a maximal maneuver.","Poor effort, data can not be interpreted.","Variable effort-results affected.","decent for first attempt at spirometry."} FVC: ***L FEV1: ***L, ***% predicted FEV1/FVC ratio: ***% Interpretation: {Blank single:19197::"Spirometry consistent with mild obstructive disease","Spirometry consistent with moderate obstructive disease","Spirometry consistent with severe obstructive disease","Spirometry consistent with possible restrictive disease","Spirometry consistent with mixed obstructive and restrictive disease","Spirometry uninterpretable due to technique","Spirometry consistent with normal pattern","No overt abnormalities noted given today's efforts"}.  Please see scanned spirometry results for details.  Skin Testing: {Blank single:19197::"Select foods","Environmental allergy panel","Environmental allergy panel and select foods","Food allergy panel","None","Deferred due to recent antihistamines use","deferred due to recent reaction"}. ***Adequate positive and negative controls Results discussed with patient/family.   {Blank single:19197::"Allergy testing results were read and interpreted by myself, documented by clinical staff."," "}  Assessment/Plan   ***  Sigurd Sos, MD  [facility]

## 2022-02-23 ENCOUNTER — Ambulatory Visit: Payer: Medicare Other | Admitting: Internal Medicine

## 2022-02-23 ENCOUNTER — Encounter (INDEPENDENT_AMBULATORY_CARE_PROVIDER_SITE_OTHER): Payer: Medicare Other | Admitting: Ophthalmology

## 2022-02-23 DIAGNOSIS — D3132 Benign neoplasm of left choroid: Secondary | ICD-10-CM | POA: Diagnosis not present

## 2022-02-23 DIAGNOSIS — H35372 Puckering of macula, left eye: Secondary | ICD-10-CM

## 2022-02-23 DIAGNOSIS — H43813 Vitreous degeneration, bilateral: Secondary | ICD-10-CM

## 2022-02-23 DIAGNOSIS — H353131 Nonexudative age-related macular degeneration, bilateral, early dry stage: Secondary | ICD-10-CM

## 2022-02-26 ENCOUNTER — Ambulatory Visit: Payer: Medicare Other | Admitting: Internal Medicine

## 2022-03-02 ENCOUNTER — Other Ambulatory Visit: Payer: Self-pay | Admitting: Family Medicine

## 2022-03-02 DIAGNOSIS — H0232 Blepharochalasis right lower eyelid: Secondary | ICD-10-CM | POA: Diagnosis not present

## 2022-03-02 DIAGNOSIS — H02135 Senile ectropion of left lower eyelid: Secondary | ICD-10-CM | POA: Diagnosis not present

## 2022-03-02 MED ORDER — METFORMIN HCL ER 500 MG PO TB24: 500.0000 mg | ORAL_TABLET | Freq: Two times a day (BID) | ORAL | 3 refills | Status: DC

## 2022-03-10 NOTE — Progress Notes (Signed)
FOLLOW UP Date of Service/Encounter:  03/15/22   Subjective:  Kara Lee (DOB: 1945/11/19) is a 76 y.o. female who returns to the [facility] on 03/15/2022 in re-evaluation of the following:  Chronic urticaria, allergic rhinitis History obtained from: chart review and patient.   For Review, LV was on 09/07/21  with Dr.Winson Eichorn seen for routine follow-up. She had been approved for Xolair, but was controlled with 1 Zyrtec tablet twice daily and Pepcid twice daily.  Experiencing some mild sedation with Zyrtec, so we discussed trial of Xyzal.   Pertinent History/Diagnostics:  Allergic Rhinitis:  - labs environmental panel (04/30/21): + tree pollen, detectable grass and weed pollen below traditional threshold for positive Chronic hives:  Claritin ineffective.  Zyrtec and Allegra cause mild sedation. - CU labs: CU index elevated (11.6), alpha gal negative, negative thryoid antibodies, normal baseline tryptase (8.2) Shellfish intolerance she can eat a small portion of popcorn shrimp without issue but if she eats a large portion she does get itchy.  Thus she tries to avoid.  She states she can eat fish like flounder without issue.  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  Carries an Epipen. --------------------------------------  Today presents for follow-up. Currently she is doing well with taking two [employer] brand antihistamines, 1 in the morning and 1 at night along with 1 Pepcid daily.  She has not tried decreasing dose.  She will still intermittently get hives which tend to be more related to pressure/heat.  She notices them more when she is lying down on her side.  Typically they will go away fairly quickly.  We did again discuss Xolair as she had some interest, but feels that her current symptoms are tolerable and she would like to continue with current therapy. Otherwise she has no new complaints.  Allergies are controlled.    Allergies as of  03/15/2022       Reactions   Bee Venom Itching, Swelling   Latex Itching, Swelling   Lovenox [enoxaparin Sodium] Other (See Comments)   Bruising   Minocycline Nausea And Vomiting   Morphine And Related Nausea And Vomiting   Nickel Other (See Comments)   Contact dermatitis    Shellfish Allergy Itching   Statins    Muscle aches   Sulfa Antibiotics Nausea And Vomiting   Tape Itching   Bandaids and adhesive tape   Cipro [ciprofloxacin Hcl] Rash   Ciprofloxacin Rash   Glimepiride Rash   Metronidazole Rash        Medication List        Accurate as of March 15, 2022  1:24 PM. If you have any questions, ask your nurse or doctor.          acetaminophen 650 MG CR tablet Commonly known as: TYLENOL Take 1,300 mg by mouth every 8 (eight) hours as needed for pain.   aspirin EC 81 MG tablet Take 81 mg by mouth at bedtime.   atenolol 25 MG tablet Commonly known as: TENORMIN TAKE 1 TABLET(25 MG) BY MOUTH TWICE DAILY   cetirizine 10 MG tablet Commonly known as: ZYRTEC Take 10 mg by mouth daily.   Comirnaty syringe Generic drug: COVID-19 mRNA vaccine 2023-2024 Inject into the muscle.   EPINEPHrine 0.3 mg/0.3 mL Soaj injection Commonly known as: EpiPen 2-Pak Inject 0.3 mg into the muscle as needed for anaphylaxis.   estradiol 0.1 MG/GM vaginal cream Commonly known as: ESTRACE Place 1 Applicatorful vaginally as needed (irritation).   famotidine 40 MG  tablet Commonly known as: PEPCID Take 40 mg by mouth daily.   FLUoxetine 40 MG capsule Commonly known as: PROZAC TAKE 1 CAPSULE(40 MG) BY MOUTH DAILY   fluticasone 50 MCG/ACT nasal spray Commonly known as: FLONASE Place 1 spray into both nostrils daily as needed for allergies.   insulin glargine 100 UNIT/ML injection Commonly known as: LANTUS Inject 10 Units into the skin daily. Sliding scale whil on prednisone   losartan 25 MG tablet Commonly known as: COZAAR TAKE 1 TABLET(25 MG) BY MOUTH DAILY   metFORMIN  500 MG 24 hr tablet Commonly known as: GLUCOPHAGE-XR Take 1 tablet (500 mg total) by mouth 2 (two) times daily with a meal.   METHOCARBAMOL PO Take by mouth as needed (for muscle spasms).   omeprazole 40 MG capsule Commonly known as: PRILOSEC Take 1 capsule (40 mg total) by mouth daily.   ondansetron 4 MG disintegrating tablet Commonly known as: ZOFRAN-ODT Take 1 tablet (4 mg total) by mouth every 8 (eight) hours as needed for nausea or vomiting.   Propylene Glycol 0.6 % Soln Place 1 drop into both eyes daily as needed (dry eyes).   Semaglutide(0.25 or 0.'5MG'$ /DOS) 2 MG/3ML Sopn Inject 0.5 mg into the skin once a week.   Shingrix injection Generic drug: Zoster Vaccine Adjuvanted Inject into the muscle.   triamcinolone cream 0.1 % Commonly known as: KENALOG Apply to itchy areas on back twice daily.   Zypitamag 4 MG Tabs Generic drug: Pitavastatin Magnesium Take 1 tablet by mouth daily. What changed: additional instructions       Past Medical History:  Diagnosis Date   Anxiety    Complication of anesthesia    Concussion November 09, 2012   From a fall at home on deck   Depression    postpartum depression after 2nd child   Diabetes mellitus ([place])    Fatty liver    GERD (gastroesophageal reflux disease)    HLD (hyperlipidemia)    Hypertension    Morbid obesity (HCC)    OSA on CPAP    uses CPAP nightly   Osteoarthritis of right knee 07/31/2013   PONV (postoperative nausea and vomiting)    Rosacea    Severe aortic stenosis    Past Surgical History:  Procedure Laterality Date   ABDOMINAL HYSTERECTOMY     still has uterus   ANKLE RECONSTRUCTION Left    ARTERY BIOPSY Left 01/21/2020   Procedure: BIOPSY TEMPORAL ARTERY;  Surgeon: Leta Baptist, MD;  Location: [facility];  Service: ENT;  Laterality: Left;   BIOPSY BREAST Bilateral    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   CESAREAN SECTION     x 2   CHOLECYSTECTOMY     COLONOSCOPY     EYE SURGERY      lasik; X3 for crossed eyes; 1 eyelid surgery   HERNIA REPAIR     PARTIAL KNEE ARTHROPLASTY Right 07/31/2013   Procedure: RIGHT UNICOMPARTMENTAL KNEE;  Surgeon: Johnny Bridge, MD;  Location: [facility];  Service: Orthopedics;  Laterality: Right;   RIGHT/LEFT HEART CATH AND CORONARY ANGIOGRAPHY N/A 10/04/2017   Procedure: RIGHT/LEFT HEART CATH AND CORONARY ANGIOGRAPHY;  Surgeon: Adrian Prows, MD;  Location: [facility];  Service: Cardiovascular;  Laterality: N/A;   TEE WITHOUT CARDIOVERSION N/A 08/31/2017   Procedure: TRANSESOPHAGEAL ECHOCARDIOGRAM (TEE);  Surgeon: Adrian Prows, MD;  [facility];  Service: Cardiovascular;  Laterality: N/A;   TEE WITHOUT CARDIOVERSION N/A 01/30/2019   Procedure: TRANSESOPHAGEAL ECHOCARDIOGRAM (  TEE);  Surgeon: Sherren Mocha, MD;  Location: [facility];  Service: Open Heart Surgery;  Laterality: N/A;   TRANSCATHETER AORTIC VALVE REPLACEMENT, TRANSFEMORAL N/A 01/30/2019   Procedure: TRANSCATHETER AORTIC VALVE REPLACEMENT, TRANSFEMORAL;  Surgeon: Sherren Mocha, MD;  Location: [facility];  Service: Open Heart Surgery;  Laterality: N/A;   TUBAL LIGATION     Otherwise, there have been no changes to her past medical history, surgical history, family history, or social history.  ROS: All others negative except as noted per HPI.   Objective:  BP 130/78 (BP Location: Right Arm, Patient Position: Sitting, Cuff Size: Normal)   Pulse 65   Temp 98.3 F (36.8 C) (Temporal)   Resp 18   Wt 218 lb 12.8 oz (99.2 kg)   SpO2 96%   BMI 40.02 kg/m  Body mass index is 40.02 kg/m. Physical Exam: General Appearance:  Alert, cooperative, no distress, appears stated age  Head:  Normocephalic, without obvious abnormality, atraumatic  Eyes:  Conjunctiva clear, EOM's intact  Nose: Nares normal, hypertrophic turbinates, normal mucosa, and no visible anterior polyps  Throat: Lips, tongue normal; teeth and gums normal, normal posterior oropharynx  Neck:  Supple, symmetrical  Lungs:   clear to auscultation bilaterally, Respirations unlabored, no coughing  Heart:  regular rate and rhythm and no murmur, Appears well perfused  Extremities: No edema  Skin: Skin color, texture, turgor normal, no rashes or lesions on visualized portions of skin  Neurologic: No gross deficits    Assessment/Plan   Chronic urticaria - for hive control continue taking high-dose antihistamine therapy: Zyrtec 1-2 tablets 1-2 times a day with Pepcid 1 tablet 1-2 times a day  - reserve Hydroxyzine for as needed use for breakthrough hives - if you are uncontrolled on this, please call to schedule a follow-up and we can re-discuss Xolair (omalizumab) the injectable medicine for hives  Food Allergy - continue avoidance of shellfish and stinging insects - have access to self-injectable epinephrine (Epipen or AuviQ) 0.'3mg'$  at all times - follow emergency action plan in case of allergic reaction  Allergic rhinitis:  - environmental allergy panel showed detectable IgE to grass pollen, tree pollen and weed pollen.  Continue avoidance measures for these allergens. - Zyrtec as above should help with allergy symptom control - use Flonase2 sprays each nostril daily for 1-2 weeks at a time before stopping once nasal congestion improves for maximum benefit - for itchy watery eyes she can use Pataday 1 drop each eye daily as needed.  Uses over-the-counter  Follow-up in 6 months or sooner if needed  Sigurd Sos, MD  [facility]

## 2022-03-15 ENCOUNTER — Ambulatory Visit: Payer: Medicare Other | Admitting: Internal Medicine

## 2022-03-15 ENCOUNTER — Encounter: Payer: Self-pay | Admitting: Internal Medicine

## 2022-03-15 VITALS — BP 130/78 | HR 65 | Temp 98.3°F | Resp 18 | Wt 218.8 lb

## 2022-03-15 DIAGNOSIS — L508 Other urticaria: Secondary | ICD-10-CM | POA: Diagnosis not present

## 2022-03-15 DIAGNOSIS — J301 Allergic rhinitis due to pollen: Secondary | ICD-10-CM

## 2022-03-15 DIAGNOSIS — T63481A Toxic effect of venom of other arthropod, accidental (unintentional), initial encounter: Secondary | ICD-10-CM

## 2022-03-15 DIAGNOSIS — G4733 Obstructive sleep apnea (adult) (pediatric): Secondary | ICD-10-CM | POA: Diagnosis not present

## 2022-03-15 DIAGNOSIS — T781XXD Other adverse food reactions, not elsewhere classified, subsequent encounter: Secondary | ICD-10-CM | POA: Diagnosis not present

## 2022-03-15 DIAGNOSIS — H0520 Unspecified exophthalmos: Secondary | ICD-10-CM | POA: Diagnosis not present

## 2022-03-15 MED ORDER — EPINEPHRINE 0.3 MG/0.3ML IJ SOAJ
0.3000 mg | INTRAMUSCULAR | 1 refills | Status: AC | PRN
Start: 2022-03-15 — End: ?

## 2022-03-15 NOTE — Patient Instructions (Addendum)
Chronic urticaria - for hive control continue taking high-dose antihistamine therapy: Zyrtec 1-2 tablets 1-2 times a day with Pepcid 1 tablet 1-2 times a day  - reserve Hydroxyzine for as needed use for breakthrough hives - if you are uncontrolled on this, please call to schedule a follow-up and we can re-discuss Xolair (omalizumab) the injectable medicine for hives  Food Allergy - continue avoidance of shellfish and stinging insects - have access to self-injectable epinephrine (Epipen or AuviQ) 0.'3mg'$  at all times - follow emergency action plan in case of allergic reaction  Allergic rhinitis:  - environmental allergy panel showed detectable IgE to grass pollen, tree pollen and weed pollen.  Continue avoidance measures for these allergens. - Zyrtec  as above should help with allergy symptom control - use Flonase2 sprays each nostril daily for 1-2 weeks at a time before stopping once nasal congestion improves for maximum benefit - for itchy watery eyes she can use Pataday 1 drop each eye daily as needed.  Purchase over-the-counter  Follow-up in 6 months or sooner if needed

## 2022-03-25 DIAGNOSIS — M542 Cervicalgia: Secondary | ICD-10-CM | POA: Diagnosis not present

## 2022-03-25 DIAGNOSIS — M5416 Radiculopathy, lumbar region: Secondary | ICD-10-CM | POA: Diagnosis not present

## 2022-03-25 DIAGNOSIS — M545 Low back pain, unspecified: Secondary | ICD-10-CM | POA: Diagnosis not present

## 2022-03-25 DIAGNOSIS — M791 Myalgia, unspecified site: Secondary | ICD-10-CM | POA: Diagnosis not present

## 2022-03-29 ENCOUNTER — Ambulatory Visit: Payer: Medicare Other

## 2022-03-29 DIAGNOSIS — I10 Essential (primary) hypertension: Secondary | ICD-10-CM | POA: Diagnosis not present

## 2022-03-29 DIAGNOSIS — Z952 Presence of prosthetic heart valve: Secondary | ICD-10-CM

## 2022-03-31 ENCOUNTER — Telehealth: Payer: Self-pay | Admitting: Gastroenterology

## 2022-03-31 NOTE — Progress Notes (Signed)
TAVR echo

## 2022-03-31 NOTE — Telephone Encounter (Addendum)
Hi Dr. Bryan Lemma,   We received a referral for patient to have a colonoscopy, she has GI history over at [facility] her last colonoscopy was done in 2016. She said she is not happy with her care there and is requesting you specifically. All of her records were obtained for you o review and advise on scheduling. Thanks   Addendum: Records received and reviewed and notable for: Previous Dr. Dorrene German at [facility].  - 2004 and 2006 had EGD and colonoscopy revealing for hyperplastic gastric and colon polyps and internal/external hemorrhoids - 03/06/2015: Normal small bowel follow-through - 01/20/2005: GI evaluation by Dr. Dorrene German for right-sided abdominal pain.  History of cholecystectomy. - 05/12/2006: Seen by Dr. Lisette Grinder at [facility] for evaluation of RUQ pain described as dull ache without precipitating factors.  Possibly worse with fatty foods, which she tries to avoid.  Reflux symptoms are controlled with Prevacid.  Suspected MSK etiology Zostrix 3 with RTC in 4 months dietary modifications for fatty liver disease. - 06/16/2007: Follow-up appoint with Dr. Gilford Rile for ongoing RUQ pain - 07/31/2007: Follow-up appointment with Dr. Gilford Rile with continued RUQ pain.  Was taking Bentyl.  Normal MRCP.  EUS at some point demonstrated dilated bile duct but no evidence of stones. - 06/2015: GI follow-up to discuss colon cancer screening.  No GI symptoms. - 04/21/2016: Colonoscopy: Single small external hemorrhoid, small internal hemorrhoids, mild sigmoid diverticulosis, 8 mm rectal polyp removed with snare (path: Fragments of colonic mucosa with distorting crush artifact).  Normal TI.  Ok to schedule OV for transfer of care.    Gerrit Heck, DO, [facility]

## 2022-04-05 ENCOUNTER — Other Ambulatory Visit: Payer: Medicare Other

## 2022-04-14 DIAGNOSIS — G4733 Obstructive sleep apnea (adult) (pediatric): Secondary | ICD-10-CM | POA: Diagnosis not present

## 2022-04-15 DIAGNOSIS — I639 Cerebral infarction, unspecified: Secondary | ICD-10-CM | POA: Diagnosis not present

## 2022-04-15 DIAGNOSIS — H05243 Constant exophthalmos, bilateral: Secondary | ICD-10-CM | POA: Diagnosis not present

## 2022-04-15 DIAGNOSIS — I6781 Acute cerebrovascular insufficiency: Secondary | ICD-10-CM | POA: Diagnosis not present

## 2022-04-15 DIAGNOSIS — I63541 Cerebral infarction due to unspecified occlusion or stenosis of right cerebellar artery: Secondary | ICD-10-CM | POA: Diagnosis not present

## 2022-04-16 ENCOUNTER — Ambulatory Visit: Payer: Medicare Other | Admitting: Cardiology

## 2022-04-20 ENCOUNTER — Encounter: Payer: Self-pay | Admitting: Cardiology

## 2022-04-20 ENCOUNTER — Other Ambulatory Visit: Payer: Self-pay | Admitting: Cardiology

## 2022-04-20 DIAGNOSIS — E7800 Pure hypercholesterolemia, unspecified: Secondary | ICD-10-CM

## 2022-04-20 DIAGNOSIS — I6523 Occlusion and stenosis of bilateral carotid arteries: Secondary | ICD-10-CM

## 2022-04-20 NOTE — Telephone Encounter (Signed)
From pt

## 2022-04-21 ENCOUNTER — Encounter: Payer: Self-pay | Admitting: Cardiology

## 2022-04-21 ENCOUNTER — Ambulatory Visit: Payer: Medicare Other | Admitting: Cardiology

## 2022-04-21 ENCOUNTER — Encounter: Payer: Self-pay | Admitting: Family Medicine

## 2022-04-21 VITALS — BP 119/76 | HR 71 | Resp 16 | Ht 62.0 in | Wt 220.0 lb

## 2022-04-21 DIAGNOSIS — I10 Essential (primary) hypertension: Secondary | ICD-10-CM

## 2022-04-21 DIAGNOSIS — Z952 Presence of prosthetic heart valve: Secondary | ICD-10-CM | POA: Diagnosis not present

## 2022-04-21 DIAGNOSIS — I8392 Asymptomatic varicose veins of left lower extremity: Secondary | ICD-10-CM | POA: Diagnosis not present

## 2022-04-21 DIAGNOSIS — E1169 Type 2 diabetes mellitus with other specified complication: Secondary | ICD-10-CM

## 2022-04-21 DIAGNOSIS — E7800 Pure hypercholesterolemia, unspecified: Secondary | ICD-10-CM

## 2022-04-21 MED ORDER — SEMAGLUTIDE(0.25 OR 0.5MG/DOS) 2 MG/3ML ~~LOC~~ SOPN: 0.2500 mg | PEN_INJECTOR | SUBCUTANEOUS | 2 refills | Status: DC

## 2022-04-21 NOTE — Progress Notes (Signed)
Primary Physician/Referring:  Shelda Pal, DO  Patient ID: Kara Lee, female    DOB: [birth year], 77 y.o.   MRN: [id number]  Chief Complaint  Patient presents with   Hypertension   Hyperlipidemia   TAVR   Follow-up    1 year    HPI:    Kara Lee  is a 77 y.o. 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  Presents for annual visit.  Presently doing well and essentially remains asymptomatic.  She has not had any dyspnea on exertion, no leg edema, no PND or orthopnea.  Past Medical History:  Diagnosis Date   Anxiety    Complication of anesthesia    Concussion November 09, 2012   From a fall at home on deck   Depression    postpartum depression after 2nd child   Diabetes mellitus ([place])    Fatty liver    GERD (gastroesophageal reflux disease)    HLD (hyperlipidemia)    Hypertension    Morbid obesity (HCC)    OSA on CPAP    uses CPAP nightly   Osteoarthritis of right knee 07/31/2013   PONV (postoperative nausea and vomiting)    Rosacea    Severe aortic stenosis    Past Surgical History:  Procedure Laterality Date   ABDOMINAL HYSTERECTOMY     still has uterus   ANKLE RECONSTRUCTION Left    ARTERY BIOPSY Left 01/21/2020   Procedure: BIOPSY TEMPORAL ARTERY;  Surgeon: Leta Baptist, MD;  Location: [place];  Service: ENT;  Laterality: Left;   BIOPSY BREAST Bilateral    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   CESAREAN SECTION     x 2   CHOLECYSTECTOMY     COLONOSCOPY     EYE SURGERY     lasik; X3 for crossed eyes; 1 eyelid surgery   HERNIA REPAIR     PARTIAL KNEE ARTHROPLASTY Right 07/31/2013   Procedure: RIGHT UNICOMPARTMENTAL KNEE;  Surgeon: Johnny Bridge, MD;  Location: [place];  Service: Orthopedics;  Laterality: Right;   RIGHT/LEFT HEART CATH AND CORONARY ANGIOGRAPHY N/A  10/04/2017   Procedure: RIGHT/LEFT HEART CATH AND CORONARY ANGIOGRAPHY;  Surgeon: Adrian Prows, MD;  Location: [place] [facility];  Service: Cardiovascular;  Laterality: N/A;   TEE WITHOUT CARDIOVERSION N/A 08/31/2017   Procedure: TRANSESOPHAGEAL ECHOCARDIOGRAM (TEE);  Surgeon: Adrian Prows, MD;  Location: [place];  Service: Cardiovascular;  Laterality: N/A;   TEE WITHOUT CARDIOVERSION N/A 01/30/2019   Procedure: TRANSESOPHAGEAL ECHOCARDIOGRAM (TEE);  Surgeon: Sherren Mocha, MD;  Location: [place] [facility];  Service: Open Heart Surgery;  Laterality: N/A;   TRANSCATHETER AORTIC VALVE REPLACEMENT, TRANSFEMORAL N/A 01/30/2019   Procedure: TRANSCATHETER AORTIC VALVE REPLACEMENT, TRANSFEMORAL;  Surgeon: Sherren Mocha, MD;  Location: [place] [facility];  Service: Open Heart Surgery;  Laterality: N/A;   TUBAL LIGATION     Family History  Problem Relation Age of Onset   Pancreatitis Mother    COPD Mother    Cancer - Lung Mother    Diabetes type II Mother    Allergic rhinitis Maternal Grandmother    Breast cancer Maternal Grandmother    Cataracts Maternal Grandfather    Asthma Son    Immunodeficiency Neg Hx    Angioedema Neg Hx    Eczema Neg Hx    Urticaria Neg Hx  Social History   Tobacco Use   Smoking status: Never   Smokeless tobacco: Never  Substance Use Topics   Alcohol use: Not Currently   Marital Status: Married   ROS  Review of Systems  Cardiovascular:  Positive for palpitations. Negative for dyspnea on exertion, leg swelling and syncope.  Gastrointestinal:  Negative for melena.   Objective  Blood pressure 119/76, pulse 71, resp. rate 16, height _0  (1.575 m), weight 220 lb (99.8 kg), SpO2 93 %.     04/21/2022    3:18 PM 03/15/2022   11:41 AM 01/22/2022    2:24 PM  Vitals with BMI  Height _1   _2   Weight 220 lbs 218 lbs 13 oz 213 lbs  BMI 93.81  01.75  Systolic 102 585 277  Diastolic 76 78 76  Pulse 71 65 69     Physical Exam Constitutional:       General: She is not in acute distress.    Appearance: She is well-developed.  Neck:     Vascular: Carotid bruit (bilateral) present. No JVD.  Cardiovascular:     Rate and Rhythm: Normal rate and regular rhythm.     Pulses: Intact distal pulses.          Dorsalis pedis pulses are 2+ on the right side and 2+ on the left side.       Posterior tibial pulses are 0 on the right side and 0 on the left side.     Heart sounds: Murmur heard.     Early systolic murmur is present with a grade of 2/6 at the upper right sternal border.     No gallop.     Comments: Varicose veins left calf Pulmonary:     Effort: Pulmonary effort is normal. No accessory muscle usage or respiratory distress.     Breath sounds: Normal breath sounds.  Abdominal:     General: Bowel sounds are normal.     Palpations: Abdomen is soft.  Musculoskeletal:     Right lower leg: No edema.     Left lower leg: No edema.    Laboratory examination:   Lab Results  Component Value Date   NA 135 11/02/2021   K 4.5 11/02/2021   CO2 24 11/02/2021   GLUCOSE 120 (H) 11/02/2021   BUN 14 11/02/2021   CREATININE 0.66 11/02/2021   CALCIUM 9.8 11/02/2021   GFRNONAA >60 04/17/2020       Latest Ref Rng & Units 11/02/2021    4:18 PM 09/17/2020   10:00 AM 04/17/2020    2:51 PM  CMP  Glucose 70 - 99 mg/dL 120  105  255   BUN 6 - 23 mg/dL _3 Creatinine 0.40 - 1.20 mg/dL 0.66  0.67  0.56   Sodium 135 - 145 mEq/L 135  136  130   Potassium 3.5 - 5.1 mEq/L 4.5  4.4  4.4   Chloride 96 - 112 mEq/L 102  103  98   CO2 19 - 32 mEq/L _4 Calcium 8.4 - 10.5 mg/dL 9.8  9.5  9.1   Total Protein 6.0 - 8.3 g/dL 7.6  6.8  7.3   Total Bilirubin 0.2 - 1.2 mg/dL 0.4  0.5  0.5   Alkaline Phos 39 - 117 U/L 58  69  47   AST 0 - 37 U/L 31  21  37   ALT 0 - 35 U/L 24  19  28       Latest Ref Rng & Units 11/02/2021    4:18 PM 08/29/2020    1:27 PM 07/15/2020    1:22 PM  CBC  WBC 4.0 - 10.5 K/uL 10.7  8.6  14.7   Hemoglobin 12.0 -  15.0 g/dL 12.5  12.3  12.2   Hematocrit 36.0 - 46.0 % 37.3  37.5  37.2   Platelets 150.0 - 400.0 K/uL 195.0  203.0  229.0    Lipid Panel Recent Labs    11/02/21 1618  CHOL 138  TRIG 116.0  LDLCALC 62  VLDL 23.2  HDL 53.00  CHOLHDL 3     HEMOGLOBIN A1C Lab Results  Component Value Date   HGBA1C 7.1 (H) 11/02/2021   MPG 159.94 01/26/2019   TSH Recent Labs    11/02/21 1618  TSH 1.20   Medications and allergies   Allergies  Allergen Reactions   Bee Venom Itching and Swelling   Latex Itching and Swelling   Lovenox [Enoxaparin Sodium] Other (See Comments)    Bruising   Minocycline Nausea And Vomiting   Morphine And Related Nausea And Vomiting   Nickel Other (See Comments)    Contact dermatitis    Shellfish Allergy Itching   Statins     Muscle aches   Sulfa Antibiotics Nausea And Vomiting   Tape Itching    Bandaids and adhesive tape   Cipro [Ciprofloxacin Hcl] Rash   Ciprofloxacin Rash   Glimepiride Rash   Metronidazole Rash     Current Outpatient Medications:    acetaminophen (TYLENOL) 650 MG CR tablet, Take 1,300 mg by mouth every 8 (eight) hours as needed for pain., Disp: , Rfl:    aspirin EC 81 MG tablet, Take 81 mg by mouth at bedtime. , Disp: , Rfl:    atenolol (TENORMIN) 25 MG tablet, TAKE 1 TABLET(25 MG) BY MOUTH TWICE DAILY, Disp: 180 tablet, Rfl: 0   cetirizine (ZYRTEC) 10 MG tablet, Take 10 mg by mouth daily., Disp: , Rfl:    EPINEPHrine (EPIPEN 2-PAK) 0.3 mg/0.3 mL IJ SOAJ injection, Inject 0.3 mg into the muscle as needed for anaphylaxis., Disp: 2 each, Rfl: 1   estradiol (ESTRACE) 0.1 MG/GM vaginal cream, Place 1 Applicatorful vaginally as needed (irritation). , Disp: , Rfl:    famotidine (PEPCID) 40 MG tablet, Take 40 mg by mouth daily., Disp: , Rfl:    FLUoxetine (PROZAC) 40 MG capsule, TAKE 1 CAPSULE(40 MG) BY MOUTH DAILY, Disp: 90 capsule, Rfl: 2   fluticasone (FLONASE) 50 MCG/ACT nasal spray, Place 1 spray into both nostrils daily as needed for  allergies., Disp: , Rfl:    insulin glargine (LANTUS) 100 UNIT/ML injection, Inject 10 Units into the skin daily. Sliding scale whil on prednisone, Disp: , Rfl:    losartan (COZAAR) 25 MG tablet, TAKE 1 TABLET(25 MG) BY MOUTH DAILY, Disp: 90 tablet, Rfl: 2   metFORMIN (GLUCOPHAGE-XR) 500 MG 24 hr tablet, Take 1 tablet (500 mg total) by mouth 2 (two) times daily with a meal., Disp: 180 tablet, Rfl: 3   METHOCARBAMOL PO, Take by mouth as needed (for muscle spasms)., Disp: , Rfl:    omeprazole (PRILOSEC) 40 MG capsule, Take 1 capsule (40 mg total) by mouth daily., Disp: 30 capsule, Rfl: 3   Propylene Glycol 0.6 % SOLN, Place 1 drop into both eyes daily as needed (dry eyes)., Disp: , Rfl:    Semaglutide,0.25 or 0.5MG/DOS, 2 MG/3ML SOPN, Inject 0.25 mg into the skin  once a week., Disp: 3 mL, Rfl: 2   triamcinolone cream (KENALOG) 0.1 %, Apply to itchy areas on back twice daily., Disp: 30 g, Rfl: 0   ZYPITAMAG 4 MG TABS, Take 1 tablet by mouth daily., Disp: 90 tablet, Rfl: 3    Radiology:   Cardiac Studies:   Carotid artery duplex 2017/09/13: Minimal stenosis in the bilateral internal carotid artery (1-15%). No significant plaque.   Antegrade right vertebral artery flow. Antegrade left vertebral artery flow. F/U studies if clinically indicated.  No significant change from 02/01/2015.  Lexiscan myoview stress test 09/16/2017: 1. Lexiscan stress test was performed. Exercise capacity was not assessed. Stress symptoms included dyspnea, dizziness and headache. Peak blood pressure 168/90 mmHg. Stress EKG is non diagnostic for ischemia as it is a pharmacologic stress. The stress electrocardiogram demonstrated sinus tachycardia, normal resting conduction, left ventricular hypertrophy, no resting arrhythmias and normal rest repolarization. 2.  The overall quality of the study is good.  Left ventricular cavity is noted to be normal on the rest and stress studies.  Gated SPECT images reveal normal myocardial  thickening and wall motion.  The left ventricular ejection fraction was calculated or visually estimated to be 69%. SPECT images demonstrate predominantly reversible, medium sized, mild intensity perfusion defect. This likely suggests LCx/RPDA territory ischemia.   3. Intermediate risk study.   Right and left heart catheterization 10/04/2017: Hemodynamic data: Right heart catheterization: RA 5/3, mean 1 mmHg, RV 24/0, EDP 4 mmHg.  PA 27/4, mean 50.  PA saturation 64%.  PW 12/16, mean 7 mmHg.  Aortic saturation 98%.  CO 4.67, CI 2.25, preserved.   Left heart catheterization hemodynamic data: LV 164/3, EDP 13 mmHg.  Aortic pressure 105/52, mean 74 mmHg.  Peak to peak pressure gradient of 27 mmHg, mean gradient 22.5 mm and valve area 0.99 cm.   Angiographic data: Normal coronary arteries, right dominant circulation.  PCV ECHOCARDIOGRAM COMPLETE 03/29/2022  Narrative Echocardiogram 03/29/2022: Normal LV systolic function with visual EF 55-60%. Left ventricle cavity is normal in size. Moderate concentric hypertrophy of the left ventricle. Normal global wall motion. Indeterminate diastolic filling pattern due to severe MAC. Calculated EF 69%. Left atrial cavity is mildly dilated at 36.8 ml/m^2. S/P TAVR .  No aortic valve regurgitation or periprosthetic regurgitation. Aortic valve peak gradient of 16.6 mmHg, mean gradient of 7.5 mmHg, aortic valve area by VTI 1.7 cm. Mild (Grade I) mitral regurgitation. Moderate calcification of the mitral valve annulus. Mild mitral valve leaflet thickening. Mildly restricted mitral valve leaflets. Structurally normal tricuspid valve.  Mild tricuspid regurgitation. No evidence of pulmonary hypertension. Structurally normal pulmonic valve.  Mild pulmonic regurgitation. No significant change compared to 02/2021.  EKG  EKG 01/303/2024: Normal sinus rhythm at rate of 63 bpm, left axis deviation, left intrafascicular block.  Poor R progression, probably normal  variant, IVCD, LVH.  Compared to 04/16/2021, no change.  Assessment     ICD-10-CM   1. Essential hypertension  I10 EKG 12-Lead    2. S/P TAVR (64m Edwards Sapien bioprosthetic on 01/30/2019)  Z95.2     3. Pure hypercholesterolemia  E78.00        No orders of the defined types were placed in this encounter.   Medications Discontinued During This Encounter  Medication Reason   COVID-19 mRNA vaccine 2023-2024 (COMIRNATY) syringe Completed Course   ondansetron (ZOFRAN-ODT) 4 MG disintegrating tablet    Zoster Vaccine Adjuvanted ([facility] injection Completed Course    Recommendations:   Kara Lee is a  77 y.o. 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 She now presents for annual follow up.  1. Essential hypertension Blood pressure is under excellent control.  Presently on atenolol and losartan, continue the same.  2. S/P TAVR (67m Edwards Sapien bioprosthetic on 01/30/2019) Reviewed the results of the echocardiogram, gradients across the aortic valve has remained stable.  She has significant degenerative changes in the mitral valve annulus but no mitral stenosis.  I reassured the patient.  3. Pure hypercholesterolemia She is tolerating Livalo, lipids under excellent control.  I reviewed her external labs.  4. Varicose veins of left calf She has developed left calf varicose veins that are more prominent now than before, I discussed with her regarding weight loss, she has gained significant amount of weight once Ozempic was discontinued due to insurance cost.  She is now trying to go back on Ozempic.  I have advised patient to wear support stockings on a daily basis.  Otherwise stable from cardiac standpoint, I will see her back in a year with repeat echocardiogram.    JAdrian Prows MD, [facility]1/06/2022, 4[place] PM Office: [phone]: [phone]

## 2022-04-23 NOTE — Telephone Encounter (Signed)
Called patient to schedule office visit per Dr. Bryan Lemma. Left voicemail.

## 2022-04-28 DIAGNOSIS — M5416 Radiculopathy, lumbar region: Secondary | ICD-10-CM | POA: Diagnosis not present

## 2022-05-06 ENCOUNTER — Encounter: Payer: Self-pay | Admitting: Gastroenterology

## 2022-05-10 ENCOUNTER — Encounter (INDEPENDENT_AMBULATORY_CARE_PROVIDER_SITE_OTHER): Payer: Medicare Other | Admitting: Ophthalmology

## 2022-05-11 ENCOUNTER — Encounter: Payer: Medicare Other | Admitting: Family Medicine

## 2022-05-11 DIAGNOSIS — H04203 Unspecified epiphora, bilateral lacrimal glands: Secondary | ICD-10-CM | POA: Diagnosis not present

## 2022-05-11 DIAGNOSIS — H02831 Dermatochalasis of right upper eyelid: Secondary | ICD-10-CM | POA: Diagnosis not present

## 2022-05-12 ENCOUNTER — Encounter: Payer: Self-pay | Admitting: Family Medicine

## 2022-05-12 ENCOUNTER — Ambulatory Visit (INDEPENDENT_AMBULATORY_CARE_PROVIDER_SITE_OTHER): Payer: Medicare Other | Admitting: Family Medicine

## 2022-05-12 VITALS — BP 110/68 | HR 78 | Temp 97.9°F | Resp 18 | Ht 62.0 in | Wt 219.0 lb

## 2022-05-12 DIAGNOSIS — E669 Obesity, unspecified: Secondary | ICD-10-CM | POA: Diagnosis not present

## 2022-05-12 DIAGNOSIS — Z0000 Encounter for general adult medical examination without abnormal findings: Secondary | ICD-10-CM | POA: Diagnosis not present

## 2022-05-12 DIAGNOSIS — E1169 Type 2 diabetes mellitus with other specified complication: Secondary | ICD-10-CM

## 2022-05-12 LAB — MICROALBUMIN / CREATININE URINE RATIO
Creatinine,U: 352.5 mg/dL
Microalb Creat Ratio: 2.9 mg/g (ref 0.0–30.0)
Microalb, Ur: 10 mg/dL — ABNORMAL HIGH (ref 0.0–1.9)

## 2022-05-12 LAB — COMPREHENSIVE METABOLIC PANEL
ALT: 15 U/L (ref 0–35)
AST: 18 U/L (ref 0–37)
Albumin: 4.2 g/dL (ref 3.5–5.2)
Alkaline Phosphatase: 60 U/L (ref 39–117)
BUN: 17 mg/dL (ref 6–23)
CO2: 25 mEq/L (ref 19–32)
Calcium: 9.4 mg/dL (ref 8.4–10.5)
Chloride: 101 mEq/L (ref 96–112)
Creatinine, Ser: 0.77 mg/dL (ref 0.40–1.20)
GFR: 76.11 mL/min (ref >60.00)
Glucose, Bld: 163 mg/dL — ABNORMAL HIGH (ref 70–99)
Potassium: 4.4 mEq/L (ref 3.5–5.1)
Sodium: 136 mEq/L (ref 135–145)
Total Bilirubin: 0.4 mg/dL (ref 0.2–1.2)
Total Protein: 6.8 g/dL (ref 6.0–8.3)

## 2022-05-12 LAB — LIPID PANEL
Cholesterol: 159 mg/dL (ref 0–200)
HDL: 52.7 mg/dL (ref >39.00)
LDL Cholesterol: 74 mg/dL (ref 0–99)
NonHDL: 106.62
Total CHOL/HDL Ratio: 3
Triglycerides: 165 mg/dL — ABNORMAL HIGH (ref 0.0–149.0)
VLDL: 33 mg/dL (ref 0.0–40.0)

## 2022-05-12 LAB — CBC
HCT: 37.5 % (ref 36.0–46.0)
Hemoglobin: 12.6 g/dL (ref 12.0–15.0)
MCHC: 33.6 g/dL (ref 30.0–36.0)
MCV: 89.7 fl (ref 78.0–100.0)
Platelets: 197 10*3/uL (ref 150.0–400.0)
RBC: 4.18 Mil/uL (ref 3.87–5.11)
RDW: 13.6 % (ref 11.5–15.5)
WBC: 10.6 10*3/uL — ABNORMAL HIGH (ref 4.0–10.5)

## 2022-05-12 LAB — HEMOGLOBIN A1C: Hgb A1c MFr Bld: 7.7 % — ABNORMAL HIGH (ref 4.6–6.5)

## 2022-05-12 NOTE — Progress Notes (Signed)
Chief Complaint  Patient presents with   Annual Exam    Pt states fasting      Well Woman Kara Lee is here for a complete physical.   Her last physical was >1 year ago.  Current diet: in general, a "healthy" diet. Current exercise: none. Weight is slightly increased and she denies daytime fatigue. Seatbelt? Yes Advanced directive? Yes  Health Maintenance Colonoscopy- Yes Shingrix- Yes DEXA- Yes Mammogram- Yes Tetanus- Yes Pneumonia- Yes Hep C screen- Yes  Past Medical History:  Diagnosis Date   Anxiety    Complication of anesthesia    Concussion November 09, 2012   From a fall at home on deck   Depression    postpartum depression after 2nd child   Diabetes mellitus ([place])    Fatty liver    GERD (gastroesophageal reflux disease)    HLD (hyperlipidemia)    Hypertension    Morbid obesity (HCC)    OSA on CPAP    uses CPAP nightly   Osteoarthritis of right knee 07/31/2013   PONV (postoperative nausea and vomiting)    Rosacea    Severe aortic stenosis      Past Surgical History:  Procedure Laterality Date   ABDOMINAL HYSTERECTOMY     still has uterus   ANKLE RECONSTRUCTION Left    ARTERY BIOPSY Left 01/21/2020   Procedure: BIOPSY TEMPORAL ARTERY;  Surgeon: Leta Baptist, MD;  Location: [place];  Service: ENT;  Laterality: Left;   BIOPSY BREAST Bilateral    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   CESAREAN SECTION     x 2   CHOLECYSTECTOMY     COLONOSCOPY     EYE SURGERY     lasik; X3 for crossed eyes; 1 eyelid surgery   HERNIA REPAIR     PARTIAL KNEE ARTHROPLASTY Right 07/31/2013   Procedure: RIGHT UNICOMPARTMENTAL KNEE;  Surgeon: Johnny Bridge, MD;  Location: [place];  Service: Orthopedics;  Laterality: Right;   RIGHT/LEFT HEART CATH AND CORONARY ANGIOGRAPHY N/A 10/04/2017   Procedure: RIGHT/LEFT HEART CATH AND CORONARY ANGIOGRAPHY;  Surgeon: Adrian Prows, MD;  Location: [place] [facility];  Service: Cardiovascular;  Laterality: N/A;   TEE  WITHOUT CARDIOVERSION N/A 08/31/2017   Procedure: TRANSESOPHAGEAL ECHOCARDIOGRAM (TEE);  Surgeon: Adrian Prows, MD;  Location: [place];  Service: Cardiovascular;  Laterality: N/A;   TEE WITHOUT CARDIOVERSION N/A 01/30/2019   Procedure: TRANSESOPHAGEAL ECHOCARDIOGRAM (TEE);  Surgeon: Sherren Mocha, MD;  Location: [place] [facility];  Service: Open Heart Surgery;  Laterality: N/A;   TRANSCATHETER AORTIC VALVE REPLACEMENT, TRANSFEMORAL N/A 01/30/2019   Procedure: TRANSCATHETER AORTIC VALVE REPLACEMENT, TRANSFEMORAL;  Surgeon: Sherren Mocha, MD;  Location: [place] [facility];  Service: Open Heart Surgery;  Laterality: N/A;   TUBAL LIGATION      Medications  Current Outpatient Medications on File Prior to Visit  Medication Sig Dispense Refill   acetaminophen (TYLENOL) 650 MG CR tablet Take 1,300 mg by mouth every 8 (eight) hours as needed for pain.     aspirin EC 81 MG tablet Take 81 mg by mouth at bedtime.      atenolol (TENORMIN) 25 MG tablet TAKE 1 TABLET(25 MG) BY MOUTH TWICE DAILY 180 tablet 0   cetirizine (ZYRTEC) 10 MG tablet Take 10 mg by mouth daily.     EPINEPHrine (EPIPEN 2-PAK) 0.3 mg/0.3 mL IJ SOAJ injection Inject 0.3 mg into the muscle as needed for anaphylaxis. 2 each 1   estradiol (ESTRACE) 0.1  MG/GM vaginal cream Place 1 Applicatorful vaginally as needed (irritation).      famotidine (PEPCID) 40 MG tablet Take 40 mg by mouth daily.     FLUoxetine (PROZAC) 40 MG capsule TAKE 1 CAPSULE(40 MG) BY MOUTH DAILY 90 capsule 2   fluticasone (FLONASE) 50 MCG/ACT nasal spray Place 1 spray into both nostrils daily as needed for allergies.     insulin glargine (LANTUS) 100 UNIT/ML injection Inject 10 Units into the skin daily. Sliding scale whil on prednisone     losartan (COZAAR) 25 MG tablet TAKE 1 TABLET(25 MG) BY MOUTH DAILY 90 tablet 2   metFORMIN (GLUCOPHAGE-XR) 500 MG 24 hr tablet Take 1 tablet (500 mg total) by mouth 2 (two) times daily with a meal. 180 tablet 3   METHOCARBAMOL  PO Take by mouth as needed (for muscle spasms).     omeprazole (PRILOSEC) 40 MG capsule Take 1 capsule (40 mg total) by mouth daily. 30 capsule 3   Propylene Glycol 0.6 % SOLN Place 1 drop into both eyes daily as needed (dry eyes).     triamcinolone cream (KENALOG) 0.1 % Apply to itchy areas on back twice daily. 30 g 0   ZYPITAMAG 4 MG TABS Take 1 tablet by mouth daily. 90 tablet 3   Allergies Allergies  Allergen Reactions   Bee Venom Itching and Swelling   Latex Itching and Swelling   Lovenox [Enoxaparin Sodium] Other (See Comments)    Bruising   Minocycline Nausea And Vomiting   Morphine And Related Nausea And Vomiting   Nickel Other (See Comments)    Contact dermatitis    Shellfish Allergy Itching   Statins     Muscle aches   Sulfa Antibiotics Nausea And Vomiting   Tape Itching    Bandaids and adhesive tape   Cipro [Ciprofloxacin Hcl] Rash   Ciprofloxacin Rash   Glimepiride Rash   Metronidazole Rash    Review of Systems: Constitutional:  no fevers Eye:  no recent significant change in vision Ears:  No changes in hearing Nose/Mouth/Throat:  no complaints of nasal congestion, no sore throat Cardiovascular: no chest pain Respiratory:  No shortness of breath Gastrointestinal:  No change in bowel habits GU:  Female: negative for dysuria Integumentary:  no abnormal skin lesions reported MSK: +L wrist pain Neurologic:  no headaches Endocrine:  denies unexplained weight changes  Exam BP 110/68 (BP Location: Left Arm, Patient Position: Sitting, Cuff Size: Large)   Pulse 78   Temp 97.9 F (36.6 C) (Oral)   Resp 18   Ht '5\' 2"'$  (1.575 m)   Wt 219 lb (99.3 kg)   SpO2 94%   BMI 40.06 kg/m  General:  well developed, well nourished, in no apparent distress Skin:  no significant moles, warts, or growths Head:  no masses, lesions, or tenderness Eyes:  pupils equal and round, sclera anicteric without injection Ears:  canals without lesions, TMs shiny without retraction, no  obvious effusion, no erythema Nose:  nares patent, mucosa normal, and no drainage Throat/Pharynx:  lips and gingiva without lesion; tongue and uvula midline; non-inflamed pharynx; no exudates or postnasal drainage Neck: neck supple without adenopathy, thyromegaly, or masses Lungs:  clear to auscultation, breath sounds equal bilaterally, no respiratory distress Cardio:  regular rate and rhythm, no bruits or LE edema Abdomen:  abdomen soft, nontender; bowel sounds normal; no masses or organomegaly Genital: Deferred Neuro:  gait normal; deep tendon reflexes normal and symmetric Psych: well oriented with normal range of affect and  appropriate judgment/insight  Assessment and Plan  Well adult exam  Diabetes mellitus type 2 in obese ([place]) - Plan: CBC, Comprehensive metabolic panel, Lipid panel, Hemoglobin A1c, Microalbumin / creatinine urine ratio   Well 77 y.o. female. Counseled on diet and exercise. Other orders as above. Follow up in 6 mo or prn. The patient voiced understanding and agreement to the plan.  [place], DO 05/12/22 9:37 AM

## 2022-05-12 NOTE — Patient Instructions (Addendum)
Give [place] 2-3 business days to get the results of your labs back.   Keep the diet clean and stay active.  Let us know if you need anything.  Wrist and Forearm Exercises Do exercises exactly as told by your health care provider and adjust them as directed. It is normal to feel mild stretching, pulling, tightness, or discomfort as you do these exercises, but you should stop right away if you feel sudden pain or your pain gets worse.   RANGE OF MOTION EXERCISES These exercises warm up your muscles and joints and improve the movement and flexibility of your injured wrist and forearm. These exercises also help to relieve pain, numbness, and tingling. These exercises are done using the muscles in your injured wrist and forearm. Exercise A: Wrist Flexion, Active With your fingers relaxed, bend your wrist forward as far as you can. Hold this position for 30 seconds. Repeat 2 times. Complete this exercise 3 times per week. Exercise B: Wrist Extension, Active With your fingers relaxed, bend your wrist backward as far as you can. Hold this position for 30 seconds. Repeat 2 times. Complete this exercise 3 times per week. Exercise C: Supination, Active  Stand or sit with your arms at your sides. Bend your left / right elbow to an "L" shape (90 degrees). Turn your palm upward until you feel a gentle stretch on the inside of your forearm. Hold this position for 30 seconds. Slowly return your palm to the starting position. Repeat 2 times. Complete this exercise 3 times per week. Exercise D: Pronation, Active  Stand or sit with your arms at your sides. Bend your left / right elbow to an "L" shape (90 degrees). Turn your palm downward until you feel a gentle stretch on the top of your forearm. Hold this position for 30 seconds. Slowly return your palm to the starting position. Repeat 2 times. Complete this exercise once a day.  STRETCHING EXERCISES These exercises warm up your muscles and joints and  improve the movement and flexibility of your injured wrist and forearm. These exercises also help to relieve pain, numbness, and tingling. These exercises are done using your healthy wrist and forearm to help stretch the muscles in your injured wrist and forearm. Exercise E: Wrist Flexion, Passive  Extend your left / right arm in front of you, relax your wrist, and point your fingers downward. Gently push on the back of your hand. Stop when you feel a gentle stretch on the top of your forearm. Hold this position for 30 seconds. Repeat 2 times. Complete this exercise 3 times per week. Exercise F: Wrist Extension, Passive  Extend your left / right arm in front of you and turn your palm upward. Gently pull your palm and fingertips back so your fingers point downward. You should feel a gentle stretch on the palm-side of your forearm. Hold this position for 30 seconds. Repeat 2 times. Complete this exercise 3 times per week. Exercise G: Forearm Rotation, Supination, Passive Sit with your left / right elbow bent to an "L" shape (90 degrees) with your forearm resting on a table. Keeping your upper body and shoulder still, use your other hand to rotate your forearm palm-up until you feel a gentle to moderate stretch. Hold this position for 30 seconds. Slowly release the stretch and return to the starting position. Repeat 2 times. Complete this exercise 3 times per week. Exercise H: Forearm Rotation, Pronation, Passive Sit with your left / right elbow bent to an "  L" shape (90 degrees) with your forearm resting on a table. Keeping your upper body and shoulder still, use your other hand to rotate your forearm palm-down until you feel a gentle to moderate stretch. Hold this position for 30 seconds. Slowly release the stretch and return to the starting position. Repeat 2 times. Complete this exercise 3 times per week.  STRENGTHENING EXERCISES These exercises build strength and endurance in your wrist  and forearm. Endurance is the ability to use your muscles for a long time, even after they get tired. Exercise I: Wrist Flexors  Sit with your left / right forearm supported on a table and your hand resting palm-up over the edge of the table. Your elbow should be bent to an "L" shape (about 90 degrees) and be below the level of your shoulder. Hold a 3-5 lb weight in your left / right hand. Or, hold a rubber exercise band or tube in both hands, keeping your hands at the same level and hip distance apart. There should be a slight tension in the exercise band or tube. Slowly curl your hand up toward your forearm. Hold this position for 3 seconds. Slowly lower your hand back to the starting position. Repeat 2 times. Complete this exercise 3 times per week. Exercise J: Wrist Extensors  Sit with your left / right forearm supported on a table and your hand resting palm-down over the edge of the table. Your elbow should be bent to an "L" shape (about 90 degrees) and be below the level of your shoulder. Hold a 3-5 lb weight in your left / right hand. Or, hold a rubber exercise band or tube in both hands, keeping your hands at the same level and hip distance apart. There should be a slight tension in the exercise band or tube. Slowly curl your hand up toward your forearm. Hold this position for 3 seconds. Slowly lower your hand back to the starting position. Repeat 2 times. Complete this exercise 3 times per week. Exercise K: Forearm Rotation, Supination  Sit with your left / right forearm supported on a table and your hand resting palm-down. Your elbow should be at your side, bent to an "L" shape (about 90 degrees), and below the level of your shoulder. Keep your wrist stable and in a neutral position throughout the exercise. Gently hold a lightweight hammer with your left / right hand. Without moving your elbow or wrist, slowly rotate your palm upward to a thumbs-up position. Hold this position for 3  seconds. Slowly return your forearm to the starting position. Repeat 2 times. Complete this exercise 3 times per week. Exercise L: Forearm Rotation, Pronation  Sit with your left / right forearm supported on a table and your hand resting palm-up. Your elbow should be at your side, bent to an "L" shape (about 90 degrees), and below the level of your shoulder. Keep your wrist stable. Do not allow it to move backward or forward during the exercise. Gently hold a lightweight hammer with your left / right hand. Without moving your elbow or wrist, slowly rotate your palm and hand upward to a thumbs-up position. Hold this position for 3 seconds. Slowly return your forearm to the starting position. Repeat 2 times. Complete this exercise 3 times per week. Exercise M: Grip Strengthening  Hold one of these items in your left / right hand: play dough, therapy putty, a dense sponge, a stress ball, or a large, rolled sock. Squeeze as hard as you can without  increasing pain. Hold this position for 5 seconds. Slowly release your grip. Repeat 2 times. Complete this exercise 3 times per week.  This information is not intended to replace advice given to you by your health care provider. Make sure you discuss any questions you have with your health care provider. Document Released: 02/17/2005 Document Revised: 12/29/2015 Document Reviewed: 12/29/2014 Elsevier Interactive Patient Education  [employer].

## 2022-05-15 DIAGNOSIS — G4733 Obstructive sleep apnea (adult) (pediatric): Secondary | ICD-10-CM | POA: Diagnosis not present

## 2022-05-18 DIAGNOSIS — M5416 Radiculopathy, lumbar region: Secondary | ICD-10-CM | POA: Diagnosis not present

## 2022-05-25 ENCOUNTER — Other Ambulatory Visit: Payer: Self-pay | Admitting: Family Medicine

## 2022-05-25 ENCOUNTER — Encounter: Payer: Self-pay | Admitting: Family Medicine

## 2022-05-26 MED ORDER — OMEPRAZOLE 40 MG PO CPDR: 40.0000 mg | DELAYED_RELEASE_CAPSULE | Freq: Every day | ORAL | 3 refills | Status: DC

## 2022-06-08 ENCOUNTER — Encounter: Payer: Self-pay | Admitting: Family Medicine

## 2022-06-08 ENCOUNTER — Ambulatory Visit (INDEPENDENT_AMBULATORY_CARE_PROVIDER_SITE_OTHER): Payer: Medicare Other | Admitting: Family Medicine

## 2022-06-08 VITALS — BP 108/70 | HR 69 | Temp 99.0°F | Ht 62.0 in | Wt 222.0 lb

## 2022-06-08 DIAGNOSIS — L304 Erythema intertrigo: Secondary | ICD-10-CM

## 2022-06-08 DIAGNOSIS — H9201 Otalgia, right ear: Secondary | ICD-10-CM

## 2022-06-08 MED ORDER — CLOTRIMAZOLE-BETAMETHASONE 1-0.05 % EX CREA: TOPICAL_CREAM | CUTANEOUS | 0 refills | Status: DC

## 2022-06-08 NOTE — Progress Notes (Signed)
Chief Complaint  Patient presents with   Ear Pain    Right      Pt is here for right ear pain. 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 2nd child   Diabetes mellitus ([place])    Fatty liver    GERD (gastroesophageal reflux disease)    HLD (hyperlipidemia)    Hypertension    Morbid obesity ([facility])    OSA on CPAP    uses CPAP nightly   Osteoarthritis of right knee 07/31/2013   PONV (postoperative nausea and vomiting)    Rosacea    Severe aortic stenosis     BP 108/70 (BP Location: Right Arm, Patient Position: Sitting, Cuff Size: Normal)   Pulse 69   Temp 99 F (37.2 C) (Oral)   Ht 5' 2"$  (1.575 m)   Wt 222 lb (100.7 kg)   SpO2 97%   BMI 40.60 kg/m  General: Awake, alert, appearing stated age HEENT:  L ear- Canal patent without drainage or erythema, TM is neg R ear- canal patent with dried blood over the inferior anterior portion of the canal wall, no active bleeding noted, no otorrhea, TM is neg Nose- nares patent and without discharge Mouth- Lips, gums and dentition unremarkable, pharynx is without erythema or exudate Neck: No adenopathy Lungs: CTAB.  Normal effort, no accessory muscle use Heart: RRR Psych: Age appropriate judgment and insight, normal mood and affect  Right ear pain  Intertrigo - Plan: clotrimazole-betamethasone (LOTRISONE) cream  No signs of ear infection.  Appears to be some sort of trauma in the outer third of the canal antero-inferiorly.  As there is no wax, she will take a Q-tip and put a small amount of triple antibiotic ointment on the tip and apply to the area twice daily for the next 4 days.  She will let me know if things do not improve or get  worse. She notes the rash under her left breast is back.  The cream we have sent in before was helpful.  Will resend today. F/u prn.  Pt voiced understanding and agreement to the plan.  [place], [place] 06/08/22 4:37 PM

## 2022-06-08 NOTE — Patient Instructions (Signed)
Use a Q tip to put some triple antibiotic ointment just inside your R ear canal. Twice daily for the next 4 days.  Keep the diet clean and stay active.  Let us know if you need anything.

## 2022-06-11 ENCOUNTER — Ambulatory Visit: Payer: Medicare Other | Admitting: Gastroenterology

## 2022-06-11 ENCOUNTER — Telehealth: Payer: Self-pay

## 2022-06-11 ENCOUNTER — Encounter: Payer: Self-pay | Admitting: Gastroenterology

## 2022-06-11 VITALS — BP 112/62 | HR 72 | Ht 62.0 in | Wt 221.5 lb

## 2022-06-11 DIAGNOSIS — K5790 Diverticulosis of intestine, part unspecified, without perforation or abscess without bleeding: Secondary | ICD-10-CM

## 2022-06-11 DIAGNOSIS — Z952 Presence of prosthetic heart valve: Secondary | ICD-10-CM | POA: Diagnosis not present

## 2022-06-11 DIAGNOSIS — K635 Polyp of colon: Secondary | ICD-10-CM | POA: Diagnosis not present

## 2022-06-11 MED ORDER — NA SULFATE-K SULFATE-MG SULF 17.5-3.13-1.6 GM/177ML PO SOLN: 1.0000 | Freq: Once | ORAL | 0 refills | Status: AC

## 2022-06-11 NOTE — Telephone Encounter (Signed)
Dr. Nadyne Coombes, Please advise if this patient is cleared for upcoming Colonoscopy procedure from cardiac stand point.   [facility] Pre-operative Risk Assessment     Request for surgical clearance: Endoscopy Procedure  What type of surgery is being performed?  Colonoscopy  When is this surgery scheduled?  07/29/22  What type of clearance is required ? Medical  Are there any medications that need to be held prior to surgery and how long? No  Practice name and name of physician performing surgery? Dr. Bryan Lemma, [facility]  What is your office phone and fax number?      Phone- [phone]  [phone]  Anesthesia type (None, local, MAC, general) ?       MAC

## 2022-06-11 NOTE — Progress Notes (Signed)
Chief Complaint: Discuss colon cancer screening   Referring Provider:    Shelda Pal, DO     HPI:     Kara Lee is a 77 y.o. female with a history of diabetes, OSA (on CPAP), HLD, obesity (BMI 40), severe aortic stenosis s/p TAVR 2020, depression anxiety, cholecystectomy, hysterectomy, referred to the Gastroenterology Clinic for evaluation of colon cancer screening and colon polyp surveillance.  She is o/w without any GI symptoms. No hematochezia.   Previously followed with  Dr. Dorrene German at [place].   - 2004 and 2006 had EGD and colonoscopy revealing for hyperplastic gastric and colon polyps and internal/external hemorrhoids - 03/06/2015: Normal small bowel follow-through - 01/20/2005: GI evaluation by Dr. Dorrene German for right-sided abdominal pain.  History of cholecystectomy. - 05/12/2006: Seen by Dr. Lisette Grinder at [facility] for evaluation of RUQ pain described as dull ache without precipitating factors.  Possibly worse with fatty foods, which she tries to avoid.  Reflux symptoms are controlled with Prevacid.  Suspected MSK etiology with RTC in 4 months dietary modifications for fatty liver disease. - 06/16/2007: Follow-up appoint with Dr. Gilford Rile for ongoing RUQ pain - 07/31/2007: Follow-up appointment with Dr. Gilford Rile with continued RUQ pain.  Was taking Bentyl.  Normal MRCP.  EUS at some point demonstrated dilated bile duct but no evidence of stones. - 06/2015: GI follow-up to discuss colon cancer screening.  No GI symptoms. - 04/21/2016: Colonoscopy: Single small external hemorrhoid, small internal hemorrhoids, mild sigmoid diverticulosis, 8 mm rectal polyp removed with snare (path: Fragments of colonic mucosa with distorting crush artifact).  Normal TI.  Recommended repeat in 5 years due to questionable histology results   Follows in the Cardiology Clinic, last seen 04/21/2022.  Most recent echocardiogram was 03/29/2022 with EF 55-60%, moderate  concentric LVH, TAVR without aortic valve regurgitation, mild MR.   -04/17/2020: CT A/P: Borderline hepatic steatosis,ccy, CBD 9 mm, not enlarged for postcholecystectomy state.  5 mm stone in the mid distal CBD.  No PD dilation, occasional calcifications in the pancreatic head and uncinate process.  Diverticulosis with acute uncomplicated diverticulitis.      Latest Ref Rng & Units 05/12/2022    9:47 AM 11/02/2021    4:18 PM 09/17/2020   10:00 AM  CMP  Glucose 70 - 99 mg/dL 163  120  105   BUN 6 - 23 mg/dL '17  14  20   '$ Creatinine 0.40 - 1.20 mg/dL 0.77  0.66  0.67   Sodium 135 - 145 mEq/L 136  135  136   Potassium 3.5 - 5.1 mEq/L 4.4  4.5  4.4   Chloride 96 - 112 mEq/L 101  102  103   CO2 19 - 32 mEq/L '25  24  24   '$ Calcium 8.4 - 10.5 mg/dL 9.4  9.8  9.5   Total Protein 6.0 - 8.3 g/dL 6.8  7.6  6.8   Total Bilirubin 0.2 - 1.2 mg/dL 0.4  0.4  0.5   Alkaline Phos 39 - 117 U/L 60  58  69   AST 0 - 37 U/L '18  31  21   '$ ALT 0 - 35 U/L '15  24  19       '$ Latest Ref Rng & Units 05/12/2022    9:47 AM 11/02/2021    4:18 PM 08/29/2020    1:27 PM  CBC  WBC 4.0 - 10.5 K/uL 10.6  10.7  8.6   Hemoglobin 12.0 - 15.0 g/dL 12.6  12.5  12.3   Hematocrit 36.0 - 46.0 % 37.5  37.3  37.5   Platelets 150.0 - 400.0 K/uL 197.0  195.0  203.0       Past Medical History:  Diagnosis Date   Anxiety    Complication of anesthesia    Concussion November 09, 2012   From a fall at home on deck   Depression    postpartum depression after 2nd child   Diabetes mellitus ([place])    Fatty liver    GERD (gastroesophageal reflux disease)    HLD (hyperlipidemia)    Hypertension    Morbid obesity (HCC)    OSA on CPAP    uses CPAP nightly   Osteoarthritis of right knee 07/31/2013   PONV (postoperative nausea and vomiting)    Rosacea    Severe aortic stenosis      Past Surgical History:  Procedure Laterality Date   ABDOMINAL HYSTERECTOMY     still has uterus   ANKLE RECONSTRUCTION Left    ARTERY BIOPSY Left  01/21/2020   Procedure: BIOPSY TEMPORAL ARTERY;  Surgeon: Leta Baptist, MD;  Location: [place];  Service: ENT;  Laterality: Left;   BIOPSY BREAST Bilateral    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   CESAREAN SECTION     x 2   CHOLECYSTECTOMY     COLONOSCOPY  04/21/2016   Single small external hemorrhoid, small internal hemorrhoids, mild sigmoid diverticulosis, 8 mm rectal polyp removed with snare   EYE SURGERY     lasik; X3 for crossed eyes; 1 eyelid surgery   HERNIA REPAIR     PARTIAL KNEE ARTHROPLASTY Right 07/31/2013   Procedure: RIGHT UNICOMPARTMENTAL KNEE;  Surgeon: Johnny Bridge, MD;  Location: [place];  Service: Orthopedics;  Laterality: Right;   RIGHT/LEFT HEART CATH AND CORONARY ANGIOGRAPHY N/A 10/04/2017   Procedure: RIGHT/LEFT HEART CATH AND CORONARY ANGIOGRAPHY;  Surgeon: Adrian Prows, MD;  Location: [place] [facility];  Service: Cardiovascular;  Laterality: N/A;   TEE WITHOUT CARDIOVERSION N/A 08/31/2017   Procedure: TRANSESOPHAGEAL ECHOCARDIOGRAM (TEE);  Surgeon: Adrian Prows, MD;  Location: [place];  Service: Cardiovascular;  Laterality: N/A;   TEE WITHOUT CARDIOVERSION N/A 01/30/2019   Procedure: TRANSESOPHAGEAL ECHOCARDIOGRAM (TEE);  Surgeon: Sherren Mocha, MD;  Location: [place] [facility];  Service: Open Heart Surgery;  Laterality: N/A;   TRANSCATHETER AORTIC VALVE REPLACEMENT, TRANSFEMORAL N/A 01/30/2019   Procedure: TRANSCATHETER AORTIC VALVE REPLACEMENT, TRANSFEMORAL;  Surgeon: Sherren Mocha, MD;  Location: [place] [facility];  Service: Open Heart Surgery;  Laterality: N/A;   TUBAL LIGATION     Family History  Problem Relation Age of Onset   Pancreatitis Mother    COPD Mother    Cancer - Lung Mother    Diabetes type II Mother    Allergic rhinitis Maternal Grandmother    Breast cancer Maternal Grandmother    Cataracts Maternal Grandfather    Asthma Son    Immunodeficiency Neg Hx    Angioedema Neg Hx    Eczema Neg Hx    Urticaria Neg Hx     Social History   Tobacco Use   Smoking status: Never   Smokeless tobacco: Never  Vaping Use   Vaping Use: Never used  Substance Use Topics   Alcohol use: Not Currently   Drug use: No   Current Outpatient Medications  Medication Sig Dispense Refill   acetaminophen (TYLENOL) 650 MG  CR tablet Take 1,300 mg by mouth every 8 (eight) hours as needed for pain.     aspirin EC 81 MG tablet Take 81 mg by mouth at bedtime.      atenolol (TENORMIN) 25 MG tablet TAKE 1 TABLET(25 MG) BY MOUTH TWICE DAILY 180 tablet 0   cetirizine (ZYRTEC) 10 MG tablet Take 10 mg by mouth daily.     clotrimazole-betamethasone (LOTRISONE) cream APPLY TOPICALLY UNDER BREAST TWICE DAILY 30 g 0   EPINEPHrine (EPIPEN 2-PAK) 0.3 mg/0.3 mL IJ SOAJ injection Inject 0.3 mg into the muscle as needed for anaphylaxis. 2 each 1   estradiol (ESTRACE) 0.1 MG/GM vaginal cream Place 1 Applicatorful vaginally as needed (irritation).      famotidine (PEPCID) 40 MG tablet Take 40 mg by mouth daily.     FLUoxetine (PROZAC) 40 MG capsule TAKE 1 CAPSULE(40 MG) BY MOUTH DAILY 90 capsule 2   fluticasone (FLONASE) 50 MCG/ACT nasal spray Place 1 spray into both nostrils daily as needed for allergies.     insulin glargine (LANTUS) 100 UNIT/ML injection Inject 10 Units into the skin daily. Sliding scale whil on prednisone     losartan (COZAAR) 25 MG tablet TAKE 1 TABLET(25 MG) BY MOUTH DAILY 90 tablet 2   metFORMIN (GLUCOPHAGE-XR) 500 MG 24 hr tablet Take 1 tablet (500 mg total) by mouth 2 (two) times daily with a meal. 180 tablet 3   METHOCARBAMOL PO Take by mouth as needed (for muscle spasms).     omeprazole (PRILOSEC) 40 MG capsule Take 1 capsule (40 mg total) by mouth daily. 30 capsule 3   Propylene Glycol 0.6 % SOLN Place 1 drop into both eyes daily as needed (dry eyes).     triamcinolone cream (KENALOG) 0.1 % Apply to itchy areas on back twice daily. 30 g 0   ZYPITAMAG 4 MG TABS Take 1 tablet by mouth daily. 90 tablet 3   No current  facility-administered medications for this visit.   Allergies  Allergen Reactions   Bee Venom Itching and Swelling   Latex Itching and Swelling   Lovenox [Enoxaparin Sodium] Other (See Comments)    Bruising   Minocycline Nausea And Vomiting   Morphine And Related Nausea And Vomiting   Nickel Other (See Comments)    Contact dermatitis    Shellfish Allergy Itching   Statins     Muscle aches   Sulfa Antibiotics Nausea And Vomiting   Tape Itching    Bandaids and adhesive tape   Cipro [Ciprofloxacin Hcl] Rash   Ciprofloxacin Rash   Glimepiride Rash   Metronidazole Rash     Review of Systems: All systems reviewed and negative except where noted in HPI.     Physical Exam:    Wt Readings from Last 3 Encounters:  06/11/22 221 lb 8 oz (100.5 kg)  06/08/22 222 lb (100.7 kg)  05/12/22 219 lb (99.3 kg)    Ht '5\' 2"'$  (1.575 m)   Wt 221 lb 8 oz (100.5 kg)   BMI 40.51 kg/m  Constitutional:  Pleasant, in no acute distress. Psychiatric: Normal mood and affect. Behavior is normal. Cardiovascular: Normal rate, regular rhythm. No edema Pulmonary/chest: Effort normal and breath sounds normal. No wheezing, rales or rhonchi. Abdominal: Soft, nondistended, nontender. Bowel sounds active throughout. There are no masses palpable. No hepatomegaly. Neurological: Alert and oriented to person place and time. Skin: Skin is warm and dry. No rashes noted.   ASSESSMENT AND PLAN;   1) History of colon polyps Due  for repeat colonoscopy for ongoing polyp surveillance.  The role of repeat colonoscopy vs observation given her age, comorbidities, previous findings, etc., and she would like to proceed with repeat optical colonoscopy. - Schedule colonoscopy - Will discuss with Anesthesia service to ensure she is an appropriate candidate for outpatient [facility] vs colonoscopy at [facility] unit - Per patient preference, she would like formal cardiology clearance - Ok to resume ASA 81 mg in the  perioperative setting  2) Diverticulosis Plan for pediatric colonoscope given diverticulosis, habitus, prior abdominal surgery  3) Aortic stenosis s/p TAVR - Cardiology clearance from Dr. Einar Gip to proceed with colonoscopy - As above, will discuss with Anesthesia service  The indications, risks, and benefits of colonoscopy were explained to the patient in detail. Risks include but are not limited to bleeding, perforation, adverse reaction to medications, and cardiopulmonary compromise. Sequelae include but are not limited to the possibility of surgery, hospitalization, and mortality. The patient verbalized understanding and wished to proceed. All questions answered, referred to the scheduler and bowel prep ordered. Further recommendations pending results of the exam.     Lavena Bullion, DO, FACG  06/11/2022, 2:08 PM   Nani Ravens, [place]*

## 2022-06-11 NOTE — Patient Instructions (Addendum)
We have sent the following medications to your pharmacy for you to pick up at your convenience: [place] have been scheduled for a colonoscopy. Please follow written instructions given to you at your visit today.  Please pick up your prep supplies at the pharmacy within the next 1-3 days. If you use inhalers (even only as needed), please bring them with you on the day of your procedure.    _______________________________________________________  If your blood pressure at your visit was 140/90 or greater, please contact your primary care physician to follow up on this.  _______________________________________________________  If you are age 75 or older, your body mass index should be between 23-30. Your Body mass index is 40.51 kg/m. If this is out of the aforementioned range listed, please consider follow up with your Primary Care Provider.  __________________________________________________________  The [facility] providers would like to encourage you to use [facility] to communicate with providers for non-urgent requests or questions.  Due to long hold times on the telephone, sending your provider a message by [facility] may be a faster and more efficient way to get a response.  Please allow 48 business hours for a response.  Please remember that this is for non-urgent requests.   Due to recent changes in healthcare laws, you may see the results of your imaging and laboratory studies on MyChart before your provider has had a chance to review them.  We understand that in some cases there may be results that are confusing or concerning to you. Not all laboratory results come back in the same time frame and the provider may be waiting for multiple results in order to interpret others.  Please give [place] 48 hours in order for your provider to thoroughly review all the results before contacting the office for clarification of your results.    Thank you for choosing me and [facility].  Vito Cirigliano, D.O.

## 2022-06-14 NOTE — Telephone Encounter (Signed)
Patient made aware.

## 2022-06-14 NOTE — Telephone Encounter (Signed)
Called patient and LVM to make her aware that we received cardiology clearance from Dr. Einar Gip stating that patient is at low risk for colonoscopy procedure.

## 2022-06-16 ENCOUNTER — Encounter: Payer: Self-pay | Admitting: Family Medicine

## 2022-06-16 DIAGNOSIS — G4733 Obstructive sleep apnea (adult) (pediatric): Secondary | ICD-10-CM | POA: Diagnosis not present

## 2022-06-17 ENCOUNTER — Other Ambulatory Visit: Payer: Self-pay | Admitting: Family Medicine

## 2022-06-17 MED ORDER — AMOXICILLIN 500 MG PO CAPS: 500.0000 mg | ORAL_CAPSULE | Freq: Two times a day (BID) | ORAL | 0 refills | Status: AC

## 2022-06-25 DIAGNOSIS — S82454A Nondisplaced comminuted fracture of shaft of right fibula, initial encounter for closed fracture: Secondary | ICD-10-CM | POA: Diagnosis not present

## 2022-06-30 DIAGNOSIS — S82454D Nondisplaced comminuted fracture of shaft of right fibula, subsequent encounter for closed fracture with routine healing: Secondary | ICD-10-CM | POA: Diagnosis not present

## 2022-07-07 DIAGNOSIS — S82454D Nondisplaced comminuted fracture of shaft of right fibula, subsequent encounter for closed fracture with routine healing: Secondary | ICD-10-CM | POA: Diagnosis not present

## 2022-07-10 ENCOUNTER — Other Ambulatory Visit: Payer: Self-pay | Admitting: Cardiology

## 2022-07-10 ENCOUNTER — Other Ambulatory Visit: Payer: Self-pay | Admitting: Family Medicine

## 2022-07-10 DIAGNOSIS — R809 Proteinuria, unspecified: Secondary | ICD-10-CM

## 2022-07-15 DIAGNOSIS — G4733 Obstructive sleep apnea (adult) (pediatric): Secondary | ICD-10-CM | POA: Diagnosis not present

## 2022-07-29 ENCOUNTER — Encounter: Payer: Medicare Other | Admitting: Gastroenterology

## 2022-08-13 DIAGNOSIS — S82454D Nondisplaced comminuted fracture of shaft of right fibula, subsequent encounter for closed fracture with routine healing: Secondary | ICD-10-CM | POA: Diagnosis not present

## 2022-08-15 DIAGNOSIS — G4733 Obstructive sleep apnea (adult) (pediatric): Secondary | ICD-10-CM | POA: Diagnosis not present

## 2022-08-16 ENCOUNTER — Other Ambulatory Visit: Payer: Self-pay | Admitting: Family Medicine

## 2022-08-16 ENCOUNTER — Encounter: Payer: Self-pay | Admitting: Family Medicine

## 2022-08-25 ENCOUNTER — Encounter (INDEPENDENT_AMBULATORY_CARE_PROVIDER_SITE_OTHER): Payer: Medicare Other | Admitting: Ophthalmology

## 2022-08-25 DIAGNOSIS — H524 Presbyopia: Secondary | ICD-10-CM | POA: Diagnosis not present

## 2022-08-25 DIAGNOSIS — Z961 Presence of intraocular lens: Secondary | ICD-10-CM | POA: Diagnosis not present

## 2022-08-25 DIAGNOSIS — E119 Type 2 diabetes mellitus without complications: Secondary | ICD-10-CM | POA: Diagnosis not present

## 2022-08-25 DIAGNOSIS — H35372 Puckering of macula, left eye: Secondary | ICD-10-CM | POA: Diagnosis not present

## 2022-08-25 LAB — HM DIABETES EYE EXAM

## 2022-09-03 DIAGNOSIS — H43813 Vitreous degeneration, bilateral: Secondary | ICD-10-CM | POA: Diagnosis not present

## 2022-09-03 DIAGNOSIS — H35372 Puckering of macula, left eye: Secondary | ICD-10-CM | POA: Diagnosis not present

## 2022-09-03 DIAGNOSIS — H3581 Retinal edema: Secondary | ICD-10-CM | POA: Diagnosis not present

## 2022-09-03 DIAGNOSIS — H43392 Other vitreous opacities, left eye: Secondary | ICD-10-CM | POA: Diagnosis not present

## 2022-09-10 DIAGNOSIS — S82454D Nondisplaced comminuted fracture of shaft of right fibula, subsequent encounter for closed fracture with routine healing: Secondary | ICD-10-CM | POA: Diagnosis not present

## 2022-09-14 ENCOUNTER — Encounter: Payer: Self-pay | Admitting: Cardiology

## 2022-09-14 DIAGNOSIS — G4733 Obstructive sleep apnea (adult) (pediatric): Secondary | ICD-10-CM | POA: Diagnosis not present

## 2022-09-16 ENCOUNTER — Ambulatory Visit: Payer: Medicare Other | Admitting: Internal Medicine

## 2022-09-24 ENCOUNTER — Ambulatory Visit (INDEPENDENT_AMBULATORY_CARE_PROVIDER_SITE_OTHER): Payer: Medicare Other | Admitting: Family Medicine

## 2022-09-24 ENCOUNTER — Other Ambulatory Visit: Payer: Self-pay | Admitting: Family Medicine

## 2022-09-24 ENCOUNTER — Encounter: Payer: Self-pay | Admitting: Family Medicine

## 2022-09-24 VITALS — BP 118/70 | HR 73 | Temp 98.0°F | Ht 62.0 in | Wt 223.2 lb

## 2022-09-24 DIAGNOSIS — L308 Other specified dermatitis: Secondary | ICD-10-CM | POA: Diagnosis not present

## 2022-09-24 DIAGNOSIS — G894 Chronic pain syndrome: Secondary | ICD-10-CM | POA: Diagnosis not present

## 2022-09-24 MED ORDER — FLUOXETINE HCL 20 MG PO CAPS: 20.0000 mg | ORAL_CAPSULE | Freq: Every day | ORAL | 3 refills | Status: DC

## 2022-09-24 MED ORDER — TRIAMCINOLONE ACETONIDE 0.1 % EX CREA: TOPICAL_CREAM | CUTANEOUS | 0 refills | Status: DC

## 2022-09-24 MED ORDER — DULOXETINE HCL 20 MG PO CPEP
20.0000 mg | ORAL_CAPSULE | Freq: Every day | ORAL | 3 refills | Status: DC
Start: 2022-09-24 — End: 2022-09-27

## 2022-09-24 MED ORDER — OMEPRAZOLE 40 MG PO CPDR: 40.0000 mg | DELAYED_RELEASE_CAPSULE | Freq: Every day | ORAL | 3 refills | Status: DC

## 2022-09-24 NOTE — Patient Instructions (Signed)
Aim to do some physical exertion for 150 minutes per week. This is typically divided into 5 days per week, 30 minutes per day. The activity should be enough to get your heart rate up. Anything is better than nothing if you have time constraints.  Heat (pad or rice pillow in microwave) over affected area, 10-15 minutes twice daily.   Ice/cold pack over area for 10-15 min twice daily.  OK to take Tylenol 1000 mg (2 extra strength tabs) or 975 mg (3 regular strength tabs) every 6 hours as needed.  Let us know if you need anything.  

## 2022-09-24 NOTE — Progress Notes (Signed)
Chief Complaint  Patient presents with   Pain   Rash    Arms and under breast     Subjective: Patient is a 77 y.o. female here for fu.  Patient has a nearly 1 year history of pain over back, legs, and wrists.  No recent injury or change in activity.  When she gets stressed out, mainly with her brother-in-law, her pain gets worse.  Tylenol and ice will sometimes help.  No weakness unrelated to pain.  She took a muscle relaxer there to calm her down and help her fall asleep.  Of note, she is on Prozac 40 mg daily for anxiety.  Past Medical History:  Diagnosis Date   Anxiety    Cardiac arrhythmia    CHF (congestive heart failure) (HCC)    Colon polyp    Complication of anesthesia    Concussion 11/09/2012   From a fall at home on deck   Depression    postpartum depression after 2nd child   Diabetes mellitus (HCC)    Diverticulosis    Fatty liver    GERD (gastroesophageal reflux disease)    HLD (hyperlipidemia)    Hypertension    Morbid obesity (HCC)    OSA on CPAP    uses CPAP nightly   Osteoarthritis of right knee 07/31/2013   PONV (postoperative nausea and vomiting)    Rosacea    Severe aortic stenosis    Spinal stenosis     Objective: BP 118/70 (BP Location: Left Arm, Patient Position: Sitting, Cuff Size: Large)   Pulse 73   Temp 98 F (36.7 C) (Oral)   Ht 5\' 2"  (1.575 m)   Wt 223 lb 4 oz (101.3 kg)   SpO2 97%   BMI 40.83 kg/m  General: Awake, appears stated age MSK: TTP over the trapezius muscles and cervical, thoracic, and lumbar paraspinal musculature bilaterally.  There is also tenderness over the TA's bilaterally, quadriceps bilaterally.  No obvious synovitis of the hand joints.  No excessive warmth, erythema, or fluctuance.  There is no bony deformity. Neuro: Gait is slow and cautious.  No cerebellar signs. Lungs: No accessory muscle use Psych: Age appropriate judgment and insight, normal affect and mood  Assessment and Plan: Chronic pain syndrome - Plan:  DULoxetine (CYMBALTA) 20 MG capsule  Pruritic dermatitis - Plan: triamcinolone cream (KENALOG) 0.1 %  Chronic, uncontrolled.  Start Cymbalta 20 mg daily.  We will decrease her Prozac dosage from 40 mg daily to 20 mg daily.  Counseled on exercise.  Follow-up in 1 month to recheck.  She will let me know if there are any cost issues with this medication. Refill Kenalog for periodic dermatitis. The patient voiced understanding and agreement to the plan.  Kara Lee [place], DO 09/24/22  4:26 PM

## 2022-09-25 ENCOUNTER — Encounter: Payer: Self-pay | Admitting: Family Medicine

## 2022-09-27 ENCOUNTER — Other Ambulatory Visit: Payer: Self-pay | Admitting: Family Medicine

## 2022-09-27 MED ORDER — PREGABALIN 25 MG PO CAPS: 25.0000 mg | ORAL_CAPSULE | Freq: Two times a day (BID) | ORAL | 0 refills | Status: DC

## 2022-09-28 ENCOUNTER — Other Ambulatory Visit: Payer: Self-pay | Admitting: Family Medicine

## 2022-09-28 DIAGNOSIS — G894 Chronic pain syndrome: Secondary | ICD-10-CM

## 2022-09-28 MED ORDER — DULOXETINE HCL 20 MG PO CPEP: 20.0000 mg | ORAL_CAPSULE | Freq: Every day | ORAL | 3 refills | Status: DC

## 2022-10-15 DIAGNOSIS — G4733 Obstructive sleep apnea (adult) (pediatric): Secondary | ICD-10-CM | POA: Diagnosis not present

## 2022-10-16 ENCOUNTER — Other Ambulatory Visit: Payer: Self-pay | Admitting: Cardiology

## 2022-10-20 DIAGNOSIS — M654 Radial styloid tenosynovitis [de Quervain]: Secondary | ICD-10-CM | POA: Diagnosis not present

## 2022-10-22 ENCOUNTER — Ambulatory Visit (INDEPENDENT_AMBULATORY_CARE_PROVIDER_SITE_OTHER): Payer: Medicare Other | Admitting: Family Medicine

## 2022-10-22 ENCOUNTER — Encounter: Payer: Self-pay | Admitting: Family Medicine

## 2022-10-22 VITALS — BP 120/80 | HR 65 | Temp 98.2°F | Ht 62.0 in | Wt 222.4 lb

## 2022-10-22 DIAGNOSIS — Z7984 Long term (current) use of oral hypoglycemic drugs: Secondary | ICD-10-CM | POA: Diagnosis not present

## 2022-10-22 DIAGNOSIS — G894 Chronic pain syndrome: Secondary | ICD-10-CM | POA: Diagnosis not present

## 2022-10-22 DIAGNOSIS — E1165 Type 2 diabetes mellitus with hyperglycemia: Secondary | ICD-10-CM | POA: Diagnosis not present

## 2022-10-22 MED ORDER — DULOXETINE HCL 20 MG PO CPEP: 20.0000 mg | ORAL_CAPSULE | Freq: Every day | ORAL | 3 refills | Status: DC

## 2022-10-22 NOTE — Progress Notes (Signed)
Subjective:   Chief Complaint  Patient presents with   Follow-up    1 month     Kara Lee is a 77 y.o. female here for follow-up of diabetes.   Kara Lee's self monitored glucose range is low 100's.  Patient denies hypoglycemic reactions. She checks her glucose levels 1 time(s) per day. Patient does not require insulin.   Medications include: Metformin XR 500 mg bid (holding Ozempic due to future procedures) Diet is healthy.  Exercise: none No CP or SOB  Chronic pain Patient was started on Cymbalta 20 mg daily for chronic pain syndrome.  She reports around 50% improvement in her pain.  No adverse effects.  She reports compliance.  She does not wish to change the dosage at this time.  Past Medical History:  Diagnosis Date   Anxiety    Cardiac arrhythmia    CHF (congestive heart failure) (HCC)    Colon polyp    Complication of anesthesia    Concussion 11/09/2012   From a fall at home on deck   Depression    postpartum depression after 2nd child   Diabetes mellitus (HCC)    Diverticulosis    Fatty liver    GERD (gastroesophageal reflux disease)    HLD (hyperlipidemia)    Hypertension    Morbid obesity (HCC)    OSA on CPAP    uses CPAP nightly   Osteoarthritis of right knee 07/31/2013   PONV (postoperative nausea and vomiting)    Rosacea    Severe aortic stenosis    Spinal stenosis      Related testing: Retinal exam: Done Pneumovax: done  Objective:  BP 120/80 (BP Location: Left Arm, Patient Position: Sitting, Cuff Size: Large)   Pulse 65   Temp 98.2 F (36.8 C) (Oral)   Ht 5\' 2"  (1.575 m)   Wt 222 lb 6 oz (100.9 kg)   SpO2 98%   BMI 40.67 kg/m  General:  Well developed, well nourished, in no apparent distress Lungs:  CTAB, no access msc use Cardio:  RRR, no bruits, no LE edema Psych: Age appropriate judgment and insight  Assessment:   Type 2 diabetes mellitus with hyperglycemia, without long-term current use of insulin (HCC) - Plan: Comprehensive  metabolic panel, Hemoglobin A1c, Lipid panel  Chronic pain syndrome - Plan: DULoxetine (CYMBALTA) 20 MG capsule   Plan:   Chronic, hopefully stable.  Continue metformin XR 500 mg twice daily.  Counseled on diet and exercise. Chronic, now stable.  Continue Cymbalta 20 mg daily. F/u in 6 mo. The patient voiced understanding and agreement to the plan.  Jilda Roche [place], DO 10/22/22 2:15 PM

## 2022-10-22 NOTE — Patient Instructions (Signed)
Give us 2-3 business days to get the results of your labs back.   Keep the diet clean and stay active.  Let us know if you need anything. 

## 2022-10-23 ENCOUNTER — Encounter: Payer: Self-pay | Admitting: Family Medicine

## 2022-10-23 LAB — COMPREHENSIVE METABOLIC PANEL
AG Ratio: 1.5 (calc) (ref 1.0–2.5)
ALT: 20 U/L (ref 6–29)
AST: 20 U/L (ref 10–35)
Albumin: 4.2 g/dL (ref 3.6–5.1)
Alkaline phosphatase (APISO): 67 U/L (ref 37–153)
BUN: 15 mg/dL (ref 7–25)
CO2: 23 mmol/L (ref 20–32)
Calcium: 9.3 mg/dL (ref 8.6–10.4)
Chloride: 102 mmol/L (ref 98–110)
Creat: 0.7 mg/dL (ref 0.60–1.00)
Globulin: 2.8 g/dL (calc) (ref 1.9–3.7)
Glucose, Bld: 256 mg/dL — ABNORMAL HIGH (ref 65–99)
Potassium: 4.2 mmol/L (ref 3.5–5.3)
Sodium: 135 mmol/L (ref 135–146)
Total Bilirubin: 0.3 mg/dL (ref 0.2–1.2)
Total Protein: 7 g/dL (ref 6.1–8.1)

## 2022-10-23 LAB — LIPID PANEL
Cholesterol: 158 mg/dL (ref <200)
HDL: 59 mg/dL (ref >=50)
LDL Cholesterol (Calc): 75 mg/dL (calc)
Non-HDL Cholesterol (Calc): 99 mg/dL (calc) (ref <130)
Total CHOL/HDL Ratio: 2.7 (calc) (ref <5.0)
Triglycerides: 159 mg/dL — ABNORMAL HIGH (ref <150)

## 2022-10-23 LAB — HEMOGLOBIN A1C
Hgb A1c MFr Bld: 7.9 % of total Hgb — ABNORMAL HIGH (ref <5.7)
Mean Plasma Glucose: 180 mg/dL
eAG (mmol/L): 10 mmol/L

## 2022-10-25 ENCOUNTER — Ambulatory Visit: Payer: Medicare Other | Admitting: Family Medicine

## 2022-11-08 ENCOUNTER — Telehealth: Payer: Self-pay | Admitting: Family Medicine

## 2022-11-08 DIAGNOSIS — H35372 Puckering of macula, left eye: Secondary | ICD-10-CM | POA: Diagnosis not present

## 2022-11-08 DIAGNOSIS — H3581 Retinal edema: Secondary | ICD-10-CM | POA: Diagnosis not present

## 2022-11-08 NOTE — Telephone Encounter (Signed)
Copied from CRM [id number]. Topic: Medicare AWV >> Nov 08, 2022  1:01 PM Payton Doughty wrote: Reason for CRM: LM 11/08/2022 to schedule AWV   Verlee Rossetti; Care Guide Ambulatory Clinical Support [place] [facility] Direct Dial: [phone]

## 2022-11-14 DIAGNOSIS — G4733 Obstructive sleep apnea (adult) (pediatric): Secondary | ICD-10-CM | POA: Diagnosis not present

## 2022-11-15 ENCOUNTER — Encounter: Payer: Self-pay | Admitting: Family Medicine

## 2022-11-18 ENCOUNTER — Other Ambulatory Visit: Payer: Self-pay | Admitting: Family Medicine

## 2022-11-18 ENCOUNTER — Encounter: Payer: Self-pay | Admitting: Family Medicine

## 2022-11-18 MED ORDER — FLUOXETINE HCL 40 MG PO CAPS: 40.0000 mg | ORAL_CAPSULE | Freq: Every day | ORAL | 3 refills | Status: DC

## 2022-11-19 DIAGNOSIS — H43821 Vitreomacular adhesion, right eye: Secondary | ICD-10-CM | POA: Diagnosis not present

## 2022-11-26 ENCOUNTER — Encounter: Payer: Self-pay | Admitting: Family Medicine

## 2022-12-13 DIAGNOSIS — G4733 Obstructive sleep apnea (adult) (pediatric): Secondary | ICD-10-CM | POA: Diagnosis not present

## 2022-12-15 DIAGNOSIS — H35372 Puckering of macula, left eye: Secondary | ICD-10-CM | POA: Diagnosis not present

## 2022-12-15 DIAGNOSIS — G4733 Obstructive sleep apnea (adult) (pediatric): Secondary | ICD-10-CM | POA: Diagnosis not present

## 2022-12-17 ENCOUNTER — Other Ambulatory Visit: Payer: Self-pay | Admitting: Family Medicine

## 2022-12-17 ENCOUNTER — Encounter: Payer: Self-pay | Admitting: Family Medicine

## 2022-12-17 MED ORDER — FLUOXETINE HCL 20 MG PO CAPS: 20.0000 mg | ORAL_CAPSULE | Freq: Every day | ORAL | 3 refills | Status: DC

## 2022-12-17 MED ORDER — DULOXETINE HCL 20 MG PO CPEP: 20.0000 mg | ORAL_CAPSULE | Freq: Every day | ORAL | 3 refills | Status: DC

## 2022-12-20 ENCOUNTER — Encounter (HOSPITAL_COMMUNITY): Payer: Self-pay

## 2022-12-20 ENCOUNTER — Other Ambulatory Visit: Payer: Self-pay

## 2022-12-20 ENCOUNTER — Emergency Department (HOSPITAL_COMMUNITY)
Admission: EM | Admit: 2022-12-20 | Discharge: 2022-12-20 | Disposition: A | Discharge status: Home / Self Care | Payer: Medicare Other | Attending: Emergency Medicine | Admitting: Emergency Medicine

## 2022-12-20 ENCOUNTER — Emergency Department (HOSPITAL_COMMUNITY): Payer: Medicare Other

## 2022-12-20 DIAGNOSIS — R42 Dizziness and giddiness: Secondary | ICD-10-CM | POA: Diagnosis not present

## 2022-12-20 DIAGNOSIS — Z91040 Latex allergy status: Secondary | ICD-10-CM | POA: Insufficient documentation

## 2022-12-20 DIAGNOSIS — I480 Paroxysmal atrial fibrillation: Secondary | ICD-10-CM | POA: Insufficient documentation

## 2022-12-20 DIAGNOSIS — Z7901 Long term (current) use of anticoagulants: Secondary | ICD-10-CM | POA: Insufficient documentation

## 2022-12-20 DIAGNOSIS — Z79899 Other long term (current) drug therapy: Secondary | ICD-10-CM | POA: Insufficient documentation

## 2022-12-20 DIAGNOSIS — R000 Tachycardia, unspecified: Secondary | ICD-10-CM | POA: Diagnosis not present

## 2022-12-20 DIAGNOSIS — E119 Type 2 diabetes mellitus without complications: Secondary | ICD-10-CM | POA: Diagnosis not present

## 2022-12-20 DIAGNOSIS — I10 Essential (primary) hypertension: Secondary | ICD-10-CM | POA: Insufficient documentation

## 2022-12-20 DIAGNOSIS — Z7982 Long term (current) use of aspirin: Secondary | ICD-10-CM | POA: Diagnosis not present

## 2022-12-20 DIAGNOSIS — Z7984 Long term (current) use of oral hypoglycemic drugs: Secondary | ICD-10-CM | POA: Insufficient documentation

## 2022-12-20 DIAGNOSIS — R0789 Other chest pain: Secondary | ICD-10-CM | POA: Diagnosis not present

## 2022-12-20 DIAGNOSIS — Z794 Long term (current) use of insulin: Secondary | ICD-10-CM | POA: Insufficient documentation

## 2022-12-20 DIAGNOSIS — I700 Atherosclerosis of aorta: Secondary | ICD-10-CM | POA: Diagnosis not present

## 2022-12-20 DIAGNOSIS — R002 Palpitations: Secondary | ICD-10-CM | POA: Diagnosis not present

## 2022-12-20 DIAGNOSIS — R079 Chest pain, unspecified: Secondary | ICD-10-CM | POA: Diagnosis not present

## 2022-12-20 LAB — CBC WITH DIFFERENTIAL/PLATELET
Abs Immature Granulocytes: 0.04 10*3/uL (ref 0.00–0.07)
Basophils Absolute: 0.1 10*3/uL (ref 0.0–0.1)
Basophils Relative: 1 %
Eosinophils Absolute: 0.3 10*3/uL (ref 0.0–0.5)
Eosinophils Relative: 3 %
HCT: 38.2 % (ref 36.0–46.0)
Hemoglobin: 12.2 g/dL (ref 12.0–15.0)
Immature Granulocytes: 0 %
Lymphocytes Relative: 30 %
Lymphs Abs: 2.9 10*3/uL (ref 0.7–4.0)
MCH: 28.8 pg (ref 26.0–34.0)
MCHC: 31.9 g/dL (ref 30.0–36.0)
MCV: 90.3 fL (ref 80.0–100.0)
Monocytes Absolute: 0.7 10*3/uL (ref 0.1–1.0)
Monocytes Relative: 7 %
Neutro Abs: 5.7 10*3/uL (ref 1.7–7.7)
Neutrophils Relative %: 59 %
Platelets: 150 10*3/uL (ref 150–400)
RBC: 4.23 MIL/uL (ref 3.87–5.11)
RDW: 12.9 % (ref 11.5–15.5)
WBC: 9.7 10*3/uL (ref 4.0–10.5)
nRBC: 0 % (ref 0.0–0.2)

## 2022-12-20 LAB — COMPREHENSIVE METABOLIC PANEL
ALT: 28 U/L (ref 0–44)
AST: 42 U/L — ABNORMAL HIGH (ref 15–41)
Albumin: 3.5 g/dL (ref 3.5–5.0)
Alkaline Phosphatase: 61 U/L (ref 38–126)
Anion gap: 16 — ABNORMAL HIGH (ref 5–15)
BUN: 8 mg/dL (ref 8–23)
CO2: 20 mmol/L — ABNORMAL LOW (ref 22–32)
Calcium: 9.5 mg/dL (ref 8.9–10.3)
Chloride: 101 mmol/L (ref 98–111)
Creatinine, Ser: 0.74 mg/dL (ref 0.44–1.00)
GFR, Estimated: >60 mL/min (ref >60)
Glucose, Bld: 172 mg/dL — ABNORMAL HIGH (ref 70–99)
Potassium: 4.4 mmol/L (ref 3.5–5.1)
Sodium: 137 mmol/L (ref 135–145)
Total Bilirubin: 1 mg/dL (ref 0.3–1.2)
Total Protein: 6.9 g/dL (ref 6.5–8.1)

## 2022-12-20 LAB — TSH: TSH: 1.248 u[IU]/mL (ref 0.350–4.500)

## 2022-12-20 LAB — TROPONIN I (HIGH SENSITIVITY): Troponin I (High Sensitivity): 6 ng/L (ref <18)

## 2022-12-20 LAB — MAGNESIUM: Magnesium: 1.7 mg/dL (ref 1.7–2.4)

## 2022-12-20 MED ORDER — APIXABAN 5 MG PO TABS
5.0000 mg | ORAL_TABLET | Freq: Once | ORAL | Status: AC
  Administered 2022-12-20: 5 mg via ORAL
  Filled 2022-12-20: qty 1

## 2022-12-20 MED ORDER — APIXABAN 5 MG PO TABS: 5.0000 mg | ORAL_TABLET | Freq: Two times a day (BID) | ORAL | 0 refills | Status: DC

## 2022-12-20 NOTE — ED Provider Notes (Signed)
[facility] AT [facility] Provider Note   CSN: [id number] Arrival date & time: 12/20/22  1324     History  Chief Complaint  Patient presents with   Chest Pain    Kara Lee is a 77 y.o. female.  Patient is a 77 year old female with a past medical history of prior SVT, aortic stenosis status post TAVR presenting to the emergency department with palpitations.  Patient reports for the last 4 days she has had intermittent symptoms where she feels like her heart is beating irregularly.  She states that she had similar symptoms in this prior to having her TAVR 4 years ago which she did vagal maneuvers for in the past that converted her back to a normal rhythm.  She states that she did need adenosine once in the emergency department but is unsure of what rhythm she was in.  She states that 4 days ago she tried vagal maneuvers and converted back into a normal heart rate however since 4 PM today her heart rate has remained elevated between the 140s to 150s.  She states that she has some mild associated chest pressure and shortness of breath.  She denies any recent nausea, vomiting or diarrhea.  She states that she was recently started on Cymbalta and decreased her dose of Prozac but denies any other medication changes.  She denies any known history of A-fib and has never been on a blood thinner.  The history is provided by the patient.  Chest Pain      Home Medications Prior to Admission medications   Medication Sig Start Date End Date Taking? Authorizing Provider  apixaban (ELIQUIS) 5 MG TABS tablet Take 1 tablet (5 mg total) by mouth 2 (two) times daily. 12/20/22 01/19/23 Yes Elayne Snare K, DO  aspirin EC 81 MG tablet Take 81 mg by mouth at bedtime.    Yes [provider]  atenolol (TENORMIN) 25 MG tablet TAKE 1 TABLET(25 MG) BY MOUTH TWICE DAILY Patient taking differently: Take 25 mg by mouth 2 (two) times daily. 10/19/22  Yes Yates Decamp, MD   cetirizine (ZYRTEC) 10 MG tablet Take 10 mg by mouth in the morning.   Yes [provider]  clotrimazole-betamethasone (LOTRISONE) cream APPLY TOPICALLY UNDER BREAST TWICE DAILY Patient taking differently: Apply 1 Application topically See admin instructions. APPLY TOPICALLY UNDER AFFECTED BREAST(S) TWICE DAILY AS NEEDED FOR IRRITATION 06/08/22  Yes Wendling, Jilda Roche, DO  DULoxetine (CYMBALTA) 20 MG capsule Take 1 capsule (20 mg total) by mouth daily. 12/17/22  Yes Sharlene Dory, DO  EPINEPHrine (EPIPEN 2-PAK) 0.3 mg/0.3 mL IJ SOAJ injection Inject 0.3 mg into the muscle as needed for anaphylaxis. 03/15/22  Yes Verlee Monte, MD  FLUoxetine (PROZAC) 20 MG capsule Take 1 capsule (20 mg total) by mouth daily. 12/17/22  Yes Sharlene Dory, DO  fluticasone (FLONASE) 50 MCG/ACT nasal spray Place 1 spray into both nostrils daily as needed for allergies. 09/05/18  Yes [provider]  insulin glargine (LANTUS) 100 UNIT/ML injection Inject into the skin See admin instructions. Inject as directed into the skin, PER SLIDING SCALE, while taking Prednisone- for elevated BGL   Yes [provider]  losartan (COZAAR) 25 MG tablet TAKE 1 TABLET(25 MG) BY MOUTH DAILY Patient taking differently: Take 25 mg by mouth daily. 07/12/22  Yes Sharlene Dory, DO  metFORMIN (GLUCOPHAGE-XR) 500 MG 24 hr tablet Take 1 tablet (500 mg total) by mouth 2 (two) times daily with  a meal. 03/02/22  Yes Wendling, Jilda Roche, DO  methocarbamol (ROBAXIN) 500 MG tablet Take 500 mg by mouth every 8 (eight) hours as needed for muscle spasms.   Yes [provider]  omeprazole (PRILOSEC) 40 MG capsule Take 1 capsule (40 mg total) by mouth daily. Patient taking differently: Take 40 mg by mouth daily before breakfast. 09/24/22  Yes Wendling, Jilda Roche, DO  SYSTANE COMPLETE PF 0.6 % SOLN Place 1 drop into both eyes See admin instructions. Instill 1 drop into both eyes two to  three times a day   Yes [provider]  triamcinolone cream (KENALOG) 0.1 % Apply to itchy areas on back twice daily. 09/24/22  Yes Wendling, Jilda Roche, DO  TYLENOL 8 HOUR 650 MG CR tablet Take 1,300 mg by mouth every 8 (eight) hours as needed for pain.   Yes [provider]  ZYPITAMAG 4 MG TABS Take 1 tablet by mouth daily. Patient taking differently: Take 4 mg by mouth daily after supper. 04/20/22  Yes Yates Decamp, MD      Allergies    Bee venom, Latex, Lovenox [enoxaparin sodium], Minocycline, Morphine and codeine, Nickel, Shellfish allergy, Statins, Sulfa antibiotics, Tape, Cipro [ciprofloxacin hcl], Glimepiride, and Metronidazole    Review of Systems   Review of Systems  Cardiovascular:  Positive for chest pain.    Physical Exam Updated Vital Signs BP 110/60   Pulse 74   Temp 98.1 F (36.7 C) (Oral)   Resp 13   Ht 5\' 2"  (1.575 m)   Wt 99.8 kg   SpO2 94%   BMI 40.24 kg/m  Physical Exam Vitals and nursing note reviewed.  Constitutional:      General: She is not in acute distress.    Appearance: She is well-developed.  HENT:     Head: Normocephalic and atraumatic.  Eyes:     Extraocular Movements: Extraocular movements intact.  Cardiovascular:     Rate and Rhythm: Normal rate and regular rhythm.     Heart sounds: Normal heart sounds.  Pulmonary:     Effort: Pulmonary effort is normal.     Breath sounds: Normal breath sounds.  Abdominal:     Palpations: Abdomen is soft.     Tenderness: There is no abdominal tenderness.  Musculoskeletal:        General: Normal range of motion.     Cervical back: Normal range of motion and neck supple.     Right lower leg: No edema.     Left lower leg: No edema.  Skin:    General: Skin is warm and dry.  Neurological:     General: No focal deficit present.     Mental Status: She is alert and oriented to person, place, and time.  Psychiatric:        Mood and Affect: Mood normal.        Behavior: Behavior normal.      ED Results / Procedures / Treatments   Labs (all labs ordered are listed, but only abnormal results are displayed) Labs Reviewed  COMPREHENSIVE METABOLIC PANEL - Abnormal; Notable for the following components:      Result Value   CO2 20 (*)    Glucose, Bld 172 (*)    AST 42 (*)    Anion gap 16 (*)    All other components within normal limits  CBC WITH DIFFERENTIAL/PLATELET  MAGNESIUM  TSH  TROPONIN I (HIGH SENSITIVITY)    EKG EKG Interpretation Date/Time:  Monday December 20 2022  20:03:45 EDT Ventricular Rate:  75 PR Interval:  188 QRS Duration:  126 QT Interval:  437 QTC Calculation: 489 R Axis:   -52  Text Interpretation: Sinus rhythm Nonspecific IVCD with LAD Left ventricular hypertrophy Now in sinus comapred to prior EKG Confirmed by Elayne Snare ([id number]) on 12/20/2022 8:07:39 PM  Radiology DG Chest 2 View  Result Date: 12/20/2022 CLINICAL DATA:  Chest pain and tachycardia EXAM: CHEST - 2 VIEW COMPARISON:  Radiograph 02/03/2020 FINDINGS: TAVR. Stable cardiomediastinal silhouette. Aortic atherosclerotic calcification. No focal consolidation, pleural effusion, or pneumothorax. No displaced rib fractures. IMPRESSION: No acute cardiopulmonary disease. Electronically Signed   By: Minerva Fester M.D.   On: 12/20/2022 21:04    Procedures Procedures    Medications Ordered in ED Medications  apixaban (ELIQUIS) tablet 5 mg (has no administration in time range)    ED Course/ Medical Decision Making/ A&P Clinical Course as of 12/20/22 2208  Mon Dec 20, 2022  2103 Labs within normal range. Symptoms ongoing for 4 hours prior to arrival so single troponin is sufficient. She does have a CHADS-VASC score of 4 and will be recommended to start on anticoagulation. [VK]    Clinical Course User Index [VK] Rexford Maus, DO                                 Medical Decision Making This patient presents to the ED with chief complaint(s) of heart racing with pertinent  past medical history of SVT, aortic stenosis status post TAVR, hypertension, diabetes which further complicates the presenting complaint. The complaint involves an extensive differential diagnosis and also carries with it a high risk of complications and morbidity.    The differential diagnosis includes ACS, arrhythmia, anemia, electrolyte abnormality, thyroid dysfunction, pneumonia, pneumothorax, pulmonary edema, pleural effusion  Additional history obtained: Additional history obtained from spouse and EMS  Records reviewed Primary Care Documents and outpatient cardiology records  ED Course and Reassessment: Per EMS she was initially found to be tachycardic with a rate between the 120s to 150s appearing to be in A-fib.  They did give her 150 mg of amnio and route.  On arrival here she was in rate controlled atrial flutter with a heart rate in the 100s with improvement of her symptoms.  On my evaluation of the patient in the room her heart rate was in the 70s on the monitor and appeared to be back in normal sinus rhythm and had repeat EKG which confirmed this and showed no ischemic changes.  The patient will have workup including troponin, electrolytes, TSH and chest x-ray and will have her symptoms closely reassessed.  Independent labs interpretation:  The following labs were independently interpreted: within normal range  Independent visualization of imaging: - I independently visualized the following imaging with scope of interpretation limited to determining acute life threatening conditions related to emergency care: CXR, which revealed no acute disease  Consultation: - Consulted or discussed management/test interpretation w/ external professional: pharmacy  Consideration for admission or further workup: Patient has no emergent conditions requiring admission or further work-up at this time and is stable for discharge home with cardiology follow-up  Social Determinants of health:  N/A    Amount and/or Complexity of Data Reviewed Labs: ordered. Radiology: ordered.  Risk Prescription drug management.          Final Clinical Impression(s) / ED Diagnoses Final diagnoses:  Paroxysmal atrial fibrillation with RVR (HCC)  Rx / DC Orders ED Discharge Orders          Ordered    apixaban (ELIQUIS) 5 MG TABS tablet  2 times daily        12/20/22 2203              Rexford Maus, DO 12/20/22 2208

## 2022-12-20 NOTE — ED Triage Notes (Addendum)
Patient comes from home, c/o chest pain while walking up steps , HX afib and valve replacement. Patient states her watch said her heart rate was in the 150s, patient attempted the vagal maneuver.  EMS gave 150 of amio, Chest pain has resolved.

## 2022-12-20 NOTE — Discharge Instructions (Addendum)
You were seen in the emergency department for your heart racing.  You were found to be in atrial for flutter and a rapid heart rhythm.  You were given a medication called amiodarone by the EMS personnel and while you were in the emergency department you spontaneously converted back into a normal sinus rhythm.  A-fib can put you at increased risk of forming blood clots that can lead to stroke so we have started you on anticoagulation with Eliquis and you should take this as prescribed.  You should follow-up with your cardiologist in the next few days to have your symptoms rechecked and for further workup.  You should return to the emergency department if you feel your heart is racing and it does not go back to normal on its own within an hour, you have severe chest pain or shortness of breath with that, you pass out or if you have any other new or concerning symptoms.

## 2022-12-22 ENCOUNTER — Other Ambulatory Visit: Payer: Self-pay | Admitting: Family Medicine

## 2022-12-22 ENCOUNTER — Encounter: Payer: Self-pay | Admitting: Cardiology

## 2022-12-22 ENCOUNTER — Encounter: Payer: Self-pay | Admitting: Family Medicine

## 2022-12-22 DIAGNOSIS — I480 Paroxysmal atrial fibrillation: Secondary | ICD-10-CM

## 2022-12-22 DIAGNOSIS — G4733 Obstructive sleep apnea (adult) (pediatric): Secondary | ICD-10-CM

## 2022-12-22 NOTE — Telephone Encounter (Signed)
From patient.

## 2022-12-23 MED ORDER — APIXABAN 5 MG PO TABS: 5.0000 mg | ORAL_TABLET | Freq: Two times a day (BID) | ORAL | 1 refills | Status: DC

## 2022-12-23 NOTE — Telephone Encounter (Signed)
ICD-10-CM   1. Paroxysmal atrial fibrillation (HCC)  I48.0 apixaban (ELIQUIS) 5 MG TABS tablet     Meds ordered this encounter  Medications   apixaban (ELIQUIS) 5 MG TABS tablet    Sig: Take 1 tablet (5 mg total) by mouth 2 (two) times daily.    Dispense:  180 tablet    Refill:  1

## 2022-12-30 ENCOUNTER — Telehealth: Payer: Self-pay | Admitting: Gastroenterology

## 2022-12-30 NOTE — Telephone Encounter (Signed)
Lets have her come back in just since it has been several months, she has had a change in cardiac status. We will need new clearance from cardiology to go forward with procedure and will also now need anticoagulation clearance.

## 2022-12-30 NOTE — Telephone Encounter (Signed)
Received MyChart message from patient requesting to reschedule the colonoscopy she had to cancel earlier in the year.  Since that time (within the last two weeks), she has been diagnosed with A-Fib and put on Eliquis, 5 mg., twice a day.  Does Dr. Barron Alvine want to bring her in for another office visit before another colonoscopy is scheduled?  Please advise.  Thank you.

## 2023-01-04 ENCOUNTER — Ambulatory Visit (INDEPENDENT_AMBULATORY_CARE_PROVIDER_SITE_OTHER): Payer: Medicare Other | Admitting: Family Medicine

## 2023-01-04 ENCOUNTER — Other Ambulatory Visit: Payer: Self-pay | Admitting: Cardiology

## 2023-01-04 ENCOUNTER — Encounter: Payer: Self-pay | Admitting: Family Medicine

## 2023-01-04 VITALS — BP 120/81 | HR 75 | Temp 98.4°F | Ht 62.0 in | Wt 221.2 lb

## 2023-01-04 DIAGNOSIS — Z7984 Long term (current) use of oral hypoglycemic drugs: Secondary | ICD-10-CM | POA: Diagnosis not present

## 2023-01-04 DIAGNOSIS — E1165 Type 2 diabetes mellitus with hyperglycemia: Secondary | ICD-10-CM | POA: Diagnosis not present

## 2023-01-04 MED ORDER — OZEMPIC (0.25 OR 0.5 MG/DOSE) 2 MG/1.5ML ~~LOC~~ SOPN: 0.5000 mg | PEN_INJECTOR | SUBCUTANEOUS | 0 refills | Status: DC

## 2023-01-04 NOTE — Patient Instructions (Addendum)
For your husband's endocrinology referral: [address] [place], [place] [address] [phone]  Keep the diet clean and stay active.  Start on the 0.25 mg/week with the Ozempic for the first 4 weeks. The 0.5 mg dosage has been sent to transition to next.   Let us know if you need anything.

## 2023-01-04 NOTE — Progress Notes (Signed)
Subjective:   Chief Complaint  Patient presents with   handicap placard    Restart ozempic     Kara Lee is a 77 y.o. female here for follow-up of diabetes.   Kara Lee has not been monitoring her sugars.  Patient does not require insulin.   Medications include: metformin XR 500 mg bid Diet is fair.  Exercise: walking No CP or SOb.   Past Medical History:  Diagnosis Date   Anxiety    Cardiac arrhythmia    CHF (congestive heart failure) (HCC)    Colon polyp    Complication of anesthesia    Concussion 11/09/2012   From a fall at home on deck   Depression    postpartum depression after 2nd child   Diabetes mellitus (HCC)    Diverticulosis    Fatty liver    GERD (gastroesophageal reflux disease)    HLD (hyperlipidemia)    Hypertension    Morbid obesity (HCC)    OSA on CPAP    uses CPAP nightly   Osteoarthritis of right knee 07/31/2013   PONV (postoperative nausea and vomiting)    Rosacea    Severe aortic stenosis    Spinal stenosis      Related testing: Retinal exam: Done Pneumovax: done  Objective:  BP 120/81 (BP Location: Right Arm, Patient Position: Sitting, Cuff Size: Large)   Pulse 75   Temp 98.4 F (36.9 C) (Oral)   Ht 5\' 2"  (1.575 m)   Wt 221 lb 4 oz (100.4 kg)   SpO2 97%   BMI 40.47 kg/m  General:  Well developed, well nourished, in no apparent distress Skin:  Warm, no pallor or diaphoresis Lungs:  CTAB, no access msc use Cardio:  RRR, no bruits, no LE edema Musculoskeletal:  Symmetrical muscle groups noted without atrophy or deformity Neuro:  Sensation intact to pinprick on feet Psych: Age appropriate judgment and insight  Assessment:   Type 2 diabetes mellitus with hyperglycemia, without long-term current use of insulin (HCC)   Plan:   Chronic, uncontrolled. Cont metformin XR 500 mg bid. Add Ozempic 0.25 mg/week and then increase to 0.5 g/week. Counseled on diet and exercise. F/u as originally scheduled. The patient voiced understanding  and agreement to the plan.  Jilda Roche [place], DO 01/04/23 4:18 PM

## 2023-01-05 ENCOUNTER — Encounter: Payer: Self-pay | Admitting: Family Medicine

## 2023-01-05 DIAGNOSIS — E1165 Type 2 diabetes mellitus with hyperglycemia: Secondary | ICD-10-CM

## 2023-01-06 ENCOUNTER — Encounter: Payer: Self-pay | Admitting: Internal Medicine

## 2023-01-06 ENCOUNTER — Ambulatory Visit (INDEPENDENT_AMBULATORY_CARE_PROVIDER_SITE_OTHER): Payer: Medicare Other | Admitting: Internal Medicine

## 2023-01-06 VITALS — BP 104/60 | HR 72 | Temp 97.1°F | Ht 62.0 in | Wt 220.3 lb

## 2023-01-06 DIAGNOSIS — I350 Nonrheumatic aortic (valve) stenosis: Secondary | ICD-10-CM | POA: Diagnosis not present

## 2023-01-06 DIAGNOSIS — G4733 Obstructive sleep apnea (adult) (pediatric): Secondary | ICD-10-CM

## 2023-01-06 NOTE — Progress Notes (Signed)
01/06/23- 76 yoF never smoker for sleep evaluation for OSA on CPAP Medical problem list includes Aortic Stenosis/TAVR, HTN, PAFib, CHF, GERD, DM2, Morbid Obesity, Hyperlipidemia, Anxiety,  NPSG 2016 [facility]- AHI 86/hr,> CPAP 14.  Was to replace her machine in 2023, but they wanted her to update Split nIght test. CPAP 14/ [employer]  AirSense 10 Download compliance 71%, AHI 3.7/hr Epworth score-3 Body weight today-220 lbs She has been treated for sleep apnea for about 18 years.  Admits she is really tired of dealing with her CPAP.  We discussed alternatives and agreed it was time to update sleep study before making any changes of therapeutic direction. She denies any history of ENT surgery or lung disease. Will get flu shot at her primary care office.  Prior to Admission medications   Medication Sig Start Date End Date Taking? Authorizing Provider  apixaban (ELIQUIS) 5 MG TABS tablet Take 1 tablet (5 mg total) by mouth 2 (two) times daily. 12/23/22 06/21/23 Yes Yates Decamp, MD  atenolol (TENORMIN) 25 MG tablet Take 1 tablet (25 mg total) by mouth 2 (two) times daily. 01/05/23  Yes Yates Decamp, MD  cetirizine (ZYRTEC) 10 MG tablet Take 10 mg by mouth in the morning.   Yes [provider]  clotrimazole-betamethasone (LOTRISONE) cream APPLY TOPICALLY UNDER BREAST TWICE DAILY Patient taking differently: Apply 1 Application topically See admin instructions. APPLY TOPICALLY UNDER AFFECTED BREAST(S) TWICE DAILY AS NEEDED FOR IRRITATION 06/08/22  Yes Wendling, Jilda Roche, DO  DULoxetine (CYMBALTA) 20 MG capsule Take 1 capsule (20 mg total) by mouth daily. 12/17/22  Yes Sharlene Dory, DO  EPINEPHrine (EPIPEN 2-PAK) 0.3 mg/0.3 mL IJ SOAJ injection Inject 0.3 mg into the muscle as needed for anaphylaxis. 03/15/22  Yes Verlee Monte, MD  FLUoxetine (PROZAC) 20 MG capsule Take 1 capsule (20 mg total) by mouth daily. 12/17/22  Yes Sharlene Dory, DO  fluticasone (FLONASE) 50 MCG/ACT nasal spray  Place 1 spray into both nostrils daily as needed for allergies. 09/05/18  Yes [provider]  insulin glargine (LANTUS) 100 UNIT/ML injection Inject into the skin See admin instructions. Inject as directed into the skin, PER SLIDING SCALE, while taking Prednisone- for elevated BGL   Yes [provider]  losartan (COZAAR) 25 MG tablet TAKE 1 TABLET(25 MG) BY MOUTH DAILY Patient taking differently: Take 25 mg by mouth daily. 07/12/22  Yes Sharlene Dory, DO  metFORMIN (GLUCOPHAGE-XR) 500 MG 24 hr tablet Take 1 tablet (500 mg total) by mouth 2 (two) times daily with a meal. 03/02/22  Yes Wendling, Jilda Roche, DO  methocarbamol (ROBAXIN) 500 MG tablet Take 500 mg by mouth every 8 (eight) hours as needed for muscle spasms.   Yes [provider]  omeprazole (PRILOSEC) 40 MG capsule Take 1 capsule (40 mg total) by mouth daily. Patient taking differently: Take 40 mg by mouth daily before breakfast. 09/24/22  Yes Wendling, Jilda Roche, DO  Semaglutide,0.25 or 0.5MG /DOS, (OZEMPIC, 0.25 OR 0.5 MG/DOSE,) 2 MG/1.5ML SOPN Inject 0.5 mg into the skin once a week for 28 days. 02/01/23 03/01/23 Yes Wendling, Jilda Roche, DO  SYSTANE COMPLETE PF 0.6 % SOLN Place 1 drop into both eyes See admin instructions. Instill 1 drop into both eyes two to three times a day   Yes [provider]  triamcinolone cream (KENALOG) 0.1 % Apply to itchy areas on back twice daily. 09/24/22  Yes Wendling, Jilda Roche, DO  TYLENOL 8 HOUR 650 MG CR tablet Take 1,300 mg by  mouth every 8 (eight) hours as needed for pain.   Yes [provider]  ZYPITAMAG 4 MG TABS Take 1 tablet by mouth daily. Patient taking differently: Take 4 mg by mouth daily after supper. 04/20/22  Yes Yates Decamp, MD   Past Medical History:  Diagnosis Date   Anxiety    Cardiac arrhythmia    CHF (congestive heart failure) (HCC)    Colon polyp    Complication of anesthesia    Concussion 11/09/2012   From a fall at  home on deck   Depression    postpartum depression after 2nd child   Diabetes mellitus (HCC)    Diverticulosis    Fatty liver    GERD (gastroesophageal reflux disease)    HLD (hyperlipidemia)    Hypertension    Morbid obesity (HCC)    OSA on CPAP    uses CPAP nightly   Osteoarthritis of right knee 07/31/2013   PONV (postoperative nausea and vomiting)    Rosacea    Severe aortic stenosis    Spinal stenosis    Past Surgical History:  Procedure Laterality Date   ABDOMINAL HYSTERECTOMY     still has uterus   ANKLE RECONSTRUCTION Left    ARTERY BIOPSY Left 01/21/2020   Procedure: BIOPSY TEMPORAL ARTERY;  Surgeon: Newman Pies, MD;  Location: [facility];  Service: ENT;  Laterality: Left;   BIOPSY BREAST Bilateral    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   CESAREAN SECTION     x 2   CHOLECYSTECTOMY     COLONOSCOPY  04/21/2016   Single small external hemorrhoid, small internal hemorrhoids, mild sigmoid diverticulosis, 8 mm rectal polyp removed with snare   EYE SURGERY     lasik; X3 for crossed eyes; 1 eyelid surgery   INCISIONAL HERNIA REPAIR     PARTIAL KNEE ARTHROPLASTY Right 07/31/2013   Procedure: RIGHT UNICOMPARTMENTAL KNEE;  Surgeon: Eulas Post, MD;  Location: [facility] OR;  Service: Orthopedics;  Laterality: Right;   RIGHT/LEFT HEART CATH AND CORONARY ANGIOGRAPHY N/A 10/04/2017   Procedure: RIGHT/LEFT HEART CATH AND CORONARY ANGIOGRAPHY;  Surgeon: Yates Decamp, MD;  Location: [facility] INVASIVE CV LAB;  Service: Cardiovascular;  Laterality: N/A;   TEE WITHOUT CARDIOVERSION N/A 08/31/2017   Procedure: TRANSESOPHAGEAL ECHOCARDIOGRAM (TEE);  Surgeon: Yates Decamp, MD;  Location: [facility] ENDOSCOPY;  Service: Cardiovascular;  Laterality: N/A;   TEE WITHOUT CARDIOVERSION N/A 01/30/2019   Procedure: TRANSESOPHAGEAL ECHOCARDIOGRAM (TEE);  Surgeon: Tonny Bollman, MD;  Location: [facility] INVASIVE CV LAB;  Service: Open Heart Surgery;  Laterality: N/A;   TRANSCATHETER AORTIC VALVE  REPLACEMENT, TRANSFEMORAL N/A 01/30/2019   Procedure: TRANSCATHETER AORTIC VALVE REPLACEMENT, TRANSFEMORAL;  Surgeon: Tonny Bollman, MD;  Location: [facility] INVASIVE CV LAB;  Service: Open Heart Surgery;  Laterality: N/A;   TUBAL LIGATION     Family History  Problem Relation Age of Onset   Pancreatitis Mother    COPD Mother    Diabetes type II Mother    Cirrhosis Sister        NASH   Allergic rhinitis Maternal Grandmother    Breast cancer Maternal Grandmother    Cataracts Maternal Grandfather    Prostate cancer Maternal Grandfather    Heart disease Maternal Grandfather    Asthma Son    Kidney Stones Daughter    Immunodeficiency Neg Hx    Angioedema Neg Hx    Eczema Neg Hx    Urticaria Neg Hx    Social History   Socioeconomic History  Marital status: Married    Spouse name: Not on file   Number of children: 2   Years of education: Not on file   Highest education level: 12th grade  Occupational History   Occupation: retired  Tobacco Use   Smoking status: Never   Smokeless tobacco: Never  Vaping Use   Vaping status: Never Used  Substance and Sexual Activity   Alcohol use: Not Currently   Drug use: No   Sexual activity: Not Currently    Partners: Male    Birth control/protection: Surgical  Other Topics Concern   Not on file  Social History Narrative   Not on file   Social Determinants of Health   Financial Resource Strain: Low Risk  (10/15/2022)   Overall Financial Resource Strain (CARDIA)    Difficulty of Paying Living Expenses: Not hard at all  Food Insecurity: No Food Insecurity (10/15/2022)   Hunger Vital Sign    Worried About Running Out of Food in the Last Year: Never true    Ran Out of Food in the Last Year: Never true  Transportation Needs: No Transportation Needs (10/15/2022)   PRAPARE - Administrator, Civil Service (Medical): No    Lack of Transportation (Non-Medical): No  Physical Activity: Unknown (10/15/2022)   Exercise Vital Sign    Days  of Exercise per Week: 0 days    Minutes of Exercise per Session: Not on file  Stress: Stress Concern Present (10/15/2022)   [employer] of Occupational Health - Occupational Stress Questionnaire    Feeling of Stress : To some extent  Social Connections: Socially Isolated (10/15/2022)   Social Connection and Isolation Panel [NHANES]    Frequency of Communication with Friends and Family: Once a week    Frequency of Social Gatherings with Friends and Family: Once a week    Attends Religious Services: Never    Database administrator or Organizations: No    Attends Engineer, structural: Not on file    Marital Status: Married  Intimate Partner Violence: Unknown (03/23/2022)   Received from [employer], [facility]   HITS    Physically Hurt: Not on file    Insult or Talk Down To: Not on file    Threaten Physical Harm: Not on file    Scream or Curse: Not on file   ROS-see HPI   + = positive Constitutional:    weight loss, night sweats, fevers, chills, fatigue, lassitude. HEENT:    headaches, difficulty swallowing, tooth/dental problems, sore throat,       sneezing, itching, ear ache, nasal congestion, post nasal drip, snoring CV:    chest pain, orthopnea, PND, swelling in lower extremities, anasarca,          dizziness, +palpitations Resp:   +shortness of breath with exertion or at rest.                productive cough,   +non-productive cough, coughing up of blood.              change in color of mucus.  wheezing.   Skin:    rash or lesions. GI:  +heartburn, indigestion, abdominal pain, nausea, vomiting, diarrhea,                 change in bowel habits, loss of appetite GU: dysuria, change in color of urine, no urgency or frequency.   flank pain. MS:   joint pain, stiffness, decreased range of motion, back pain. Neuro-  nothing unusual Psych:  change in mood or affect.  depression or anxiety.   memory loss.  OBJ- Physical Exam General- Alert, Oriented,  Affect-appropriate, Distress- none acute, + obese Skin- rash-none, lesions- none, excoriation- none Lymphadenopathy- none Head- atraumatic            Eyes- Gross vision intact, PERRLA, conjunctivae and secretions clear            Ears- Hearing, canals-normal            Nose- Clear, no-Septal dev, mucus, polyps, erosion, perforation             Throat- Mallampati II/ thin, posterior , mucosa clear , drainage- none, tonsils- atrophic, +teeth Neck- flexible , trachea midline, no stridor , thyroid nl, carotid no bruit Chest - symmetrical excursion , unlabored           Heart/CV- RRR , no murmur , no gallop  , no rub, nl s1 s2                           - JVD- none , edema- none, stasis changes- none, varices- none           Lung- clear to P&A, wheeze- none, cough- none , dullness-none, rub- none           Chest wall-  Abd-  Br/ Gen/ Rectal- Not done, not indicated Extrem- cyanosis- none, clubbing, none, atrophy- none, strength- nl Neuro- grossly intact to observation

## 2023-01-06 NOTE — Patient Instructions (Signed)
Order- schedule home sleep test (off CPAP)   dx OSA  Please call us about 2 weeks after your sleep study for results and recommendations  After this we can look at whether to replace your old CPAP or look into alternatives

## 2023-01-07 ENCOUNTER — Encounter: Payer: Self-pay | Admitting: Internal Medicine

## 2023-01-07 ENCOUNTER — Encounter: Payer: Self-pay | Admitting: Family Medicine

## 2023-01-07 NOTE — Assessment & Plan Note (Signed)
We discussed alternatives to CPAP although that may still be her best option.  I think she is going to be too heavy for [facility] at this time but she may still want to go and talk with the provider. Plan-schedule home sleep test.

## 2023-01-07 NOTE — Assessment & Plan Note (Addendum)
Followed by cardiology.  Seems to be doing well after TAVR

## 2023-01-13 DIAGNOSIS — G4733 Obstructive sleep apnea (adult) (pediatric): Secondary | ICD-10-CM | POA: Diagnosis not present

## 2023-01-14 ENCOUNTER — Encounter: Payer: Self-pay | Admitting: Cardiology

## 2023-01-15 ENCOUNTER — Other Ambulatory Visit: Payer: Self-pay | Admitting: Family Medicine

## 2023-01-18 ENCOUNTER — Ambulatory Visit: Payer: Medicare Other | Attending: Cardiology | Admitting: Cardiology

## 2023-01-18 ENCOUNTER — Encounter: Payer: Self-pay | Admitting: Cardiology

## 2023-01-18 VITALS — BP 106/74 | HR 78 | Resp 16 | Ht 62.0 in | Wt 217.4 lb

## 2023-01-18 DIAGNOSIS — E66813 Obesity, class 3: Secondary | ICD-10-CM

## 2023-01-18 DIAGNOSIS — E8882 Obesity due to disruption of MC4R pathway: Secondary | ICD-10-CM

## 2023-01-18 DIAGNOSIS — G4733 Obstructive sleep apnea (adult) (pediatric): Secondary | ICD-10-CM

## 2023-01-18 DIAGNOSIS — I359 Nonrheumatic aortic valve disorder, unspecified: Secondary | ICD-10-CM

## 2023-01-18 DIAGNOSIS — I480 Paroxysmal atrial fibrillation: Secondary | ICD-10-CM

## 2023-01-18 DIAGNOSIS — Z952 Presence of prosthetic heart valve: Secondary | ICD-10-CM | POA: Diagnosis not present

## 2023-01-18 DIAGNOSIS — Z6841 Body Mass Index (BMI) 40.0 and over, adult: Secondary | ICD-10-CM

## 2023-01-18 DIAGNOSIS — Z152 Genetic susceptibility to obesity: Secondary | ICD-10-CM

## 2023-01-18 NOTE — Patient Instructions (Addendum)
Medication Instructions:  Your physician recommends that you continue on your current medications as directed. Please refer to the Current Medication list given to you today.  *If you need a refill on your cardiac medications before your next appointment, please call your pharmacy*   Lab Work: none If you have labs (blood work) drawn today and your tests are completely normal, you will receive your results only by: MyChart Message (if you have MyChart) OR A paper copy in the mail If you have any lab test that is abnormal or we need to change your treatment, we will call you to review the results.   Testing/Procedures: Your physician has requested that you have an echocardiogram. Echocardiography is a painless test that uses sound waves to create images of your heart. It provides your doctor with information about the size and shape of your heart and how well your heart's chambers and valves are working. This procedure takes approximately one hour. There are no restrictions for this procedure. Please do NOT wear cologne, perfume, aftershave, or lotions (deodorant is allowed). Please arrive 15 minutes prior to your appointment time. To be done in November or December 2024    Follow-Up: At [facility], you and your health needs are our priority.  As part of our continuing mission to provide you with exceptional heart care, we have created designated Provider Care Teams.  These Care Teams include your primary Cardiologist (physician) and Advanced Practice Providers (APPs -  Physician Assistants and Nurse Practitioners) who all work together to provide you with the care you need, when you need it.  We recommend signing up for the patient portal called "MyChart".  Sign up information is provided on this After Visit Summary.  MyChart is used to connect with patients for Virtual Visits (Telemedicine).  Patients are able to view lab/test results, encounter notes, upcoming appointments, etc.   Non-urgent messages can be sent to your provider as well.   To learn more about what you can do with MyChart, go to [email or web address].    Your next appointment:   12 month(s)  Provider:   Yates Decamp, MD     Other Instructions

## 2023-01-18 NOTE — Progress Notes (Signed)
Cardiology Office Note:  .   Date:  01/18/2023  ID:  Kara Lee, DOB 1945/06/26, MRN [id number] PCP: Sharlene Dory, DO  [facility] Providers Cardiologist:  Yates Decamp, MD    History of Present Illness: Kara Lee is a 77 y.o. female female  with morbid obesity, diabetes mellitus, hyperlipidemia, unable to tolerate statins except has been tolerating Livalo, history of SVT with no recurrence, severe aortic stenosis s/p TAVR on 01/30/19.  Presented to the emergency room on 12/20/2022 with palpitations and not feeling well, EMS found her to be in A-fib with RVR and received 300 mg of IV amiodarone and upon presentation to the ED, was in atypical a flutter finally converted to sinus rhythm and discharged home.  Discussed the use of AI scribe software for clinical note transcription with the patient, who gave verbal consent to proceed.  History of Present Illness   The patient, with a history of heart disease and obesity, presents after a recent hospitalization for heart racing. They report feeling 'really tired all the time' since the hospitalization. They have been on a blood thinner since the hospitalization and have not had their blood checked since starting the medication. They also report poor sleep quality and are in the process of seeing a pulmonologist for a new CPAP machine. They have not been using their current CPAP machine regularly due to it being old and not working well. They have not had any more heart racing symptoms since the hospitalization. They have been on Ozempic for weight loss for about three weeks and have lost about three pounds. They report not eating as much as they used to. They also report feeling 'so bad' and have had some operations on their eyes in the past year.       Review of Systems  Cardiovascular:  Negative for chest pain, dyspnea on exertion, leg swelling, palpitations and syncope.  Respiratory:  Positive for snoring (not using cpap).    Gastrointestinal:  Negative for melena.    Risk Assessment/Calculations:    CHA2DS2-VASc Score = 4   This indicates a 4.8% annual risk of stroke. The patient's score is based upon: CHF History: 0 HTN History: 0 Diabetes History: 1 Stroke History: 0 Vascular Disease History: 1 Age Score: 1 Gender Score: 1             Lab Results  Component Value Date   CHOL 158 10/22/2022   HDL 59 10/22/2022   LDLCALC 75 10/22/2022   TRIG 159 (H) 10/22/2022   CHOLHDL 2.7 10/22/2022   Lab Results  Component Value Date   NA 137 12/20/2022   K 4.4 12/20/2022   CO2 20 (L) 12/20/2022   GLUCOSE 172 (H) 12/20/2022   BUN 8 12/20/2022   CREATININE 0.74 12/20/2022   CALCIUM 9.5 12/20/2022   GFR 76.11 05/12/2022   GFRNONAA >60 12/20/2022   Lab Results  Component Value Date   WBC 9.7 12/20/2022   HGB 12.2 12/20/2022   HCT 38.2 12/20/2022   MCV 90.3 12/20/2022   PLT 150 12/20/2022   Lab Results  Component Value Date   TSH 1.248 12/20/2022   Review of Systems  Respiratory:  Positive for snoring (not using cpap).   Cardiovascular:  Negative for chest pain, dyspnea on exertion, palpitations, leg swelling and syncope.  Gastrointestinal:  Negative for melena.     Physical Exam:   VS:  BP 106/74 (BP Location: Left Arm, Patient Position: Sitting, Cuff Size:  Large)   Pulse 78   Resp 16   Ht 5\' 2"  (1.575 m)   Wt 217 lb 6.4 oz (98.6 kg)   SpO2 96%   BMI 39.76 kg/m    Wt Readings from Last 3 Encounters:  01/18/23 217 lb 6.4 oz (98.6 kg)  01/06/23 220 lb 4.8 oz (99.9 kg)  01/04/23 221 lb 4 oz (100.4 kg)     Physical Exam Constitutional:      Appearance: She is well-developed.  Neck:     Vascular: No carotid bruit or JVD.  Cardiovascular:     Rate and Rhythm: Normal rate and regular rhythm.     Pulses: Intact distal pulses.          Dorsalis pedis pulses are 2+ on the right side and 2+ on the left side.       Posterior tibial pulses are 0 on the right side and 0 on the left  side.     Heart sounds: No murmur heard.    No gallop.     Comments: Varicose veins left calf Pulmonary:     Effort: Pulmonary effort is normal. No accessory muscle usage or respiratory distress.     Breath sounds: Normal breath sounds.  Abdominal:     General: Bowel sounds are normal.     Palpations: Abdomen is soft.  Musculoskeletal:     Right lower leg: No edema.     Left lower leg: No edema.      Studies Reviewed: Marland Kitchen   EKG Interpretation Date/Time:  Tuesday January 18 2023 11:26:30 EDT Ventricular Rate:  75 PR Interval:  182 QRS Duration:  124 QT Interval:  424 QTC Calculation: 473 R Axis:   -44  Text Interpretation: EKG 01/18/2023: Normal sinus rhythm at rate of 75 bpm, left anterior fascicular block.  Incomplete right bundle branch block.  LVH with repolarization abnormality.  Compared to 12/20/2022, no significant change. Confirmed by Delrae Rend ([id number]) on 01/18/2023 11:41:56 AM    Echocardiogram 03/29/2022:   Normal LV systolic function with visual EF 55-60%. Left ventricle cavity is normal in size. Moderate concentric hypertrophy of the left ventricle. Normal global wall motion. Indeterminate diastolic filling pattern due to severe MAC. Calculated EF 69%. Left atrial cavity is mildly dilated at 36.8 ml/m^2.  S/P TAVR .  No aortic valve regurgitation or periprosthetic regurgitation. Peak velocity of 2.04 m/s and Mild (Grade I) mitral regurgitation. Moderate calcification of the mitral valve annulus. Mild mitral valve leaflet thickening. Mildly restricted mitral valve leaflets. Structurally normal tricuspid valve.  Mild tricuspid regurgitation. No evidence of pulmonary hypertension. Structurally normal pulmonic valve.  Mild pulmonic regurgitation. No significant change compared to 02/2021.  EMS documentation 12/20/2022  ASSESSMENT AND PLAN: .      ICD-10-CM   1. Paroxysmal atrial fibrillation (HCC)  I48.0 EKG 12-Lead    2. S/P TAVR (26mm Edwards Sapien bioprosthetic  on 01/30/2019)  Z95.2 ECHOCARDIOGRAM COMPLETE    3. OSA on CPAP  G47.33     4. 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)  W29.562    E88.82    Z15.2    Z68.41     5. Aortic valve disorder  I35.9 ECHOCARDIOGRAM COMPLETE     CHA2DS2-VASc Score = 4 [CHF History: 0, HTN History: 0, Diabetes History: 1, Stroke History: 0, Vascular Disease History: 1, Age Score: 1, Gender Score: 1].  Therefore, the patient's  annual risk of stroke is 4.8 %.     Assessment and Plan    Atrial Fibrillation Recent episode of atrial fibrillation managed with amiodarone in the emergency department. Currently asymptomatic. High risk for recurrence due to underlying heart disease, age, obesity, and untreated sleep apnea. -Continue blood thinner for stroke prevention. -Discontinue aspirin. She is presently maintaining sinus rhythm, no indication for starting her on antiarrhythmic therapy, she does get symptomatic palpitations if she were to go to atrial fibrillation.  Obesity On Ozempic for weight management. Recent weight loss of 3 pounds. -Continue Ozempic. -Advised to eat slowly and take breaks during meals to enhance the effect of Ozempic.  Sleep Apnea Not currently using CPAP due to outdated machine. Pending sleep study to reassess need for CPAP. -Encouraged to use CPAP regularly once new machine is obtained to improve overall health and reduce risk of atrial fibrillation recurrence.  Fatigue Ongoing fatigue, possibly related to untreated sleep apnea and recent atrial fibrillation episode. -Expect improvement with regular use of CPAP and management of atrial fibrillation.  With regard to TAVR, physical examination is unremarkable, will repeat echocardiogram sometime in December/January 2020 07-2023.  I will see her back on an annual basis.  Follow-up No additional testing required at this time. -Contact office with  any issues.        OV in 1 year Signed,  Yates Decamp, MD, [facility] 01/18/2023, 12:02 PM

## 2023-01-19 DIAGNOSIS — M654 Radial styloid tenosynovitis [de Quervain]: Secondary | ICD-10-CM | POA: Diagnosis not present

## 2023-01-23 ENCOUNTER — Encounter: Payer: Self-pay | Admitting: Family Medicine

## 2023-02-04 ENCOUNTER — Ambulatory Visit: Payer: Medicare Other | Admitting: Family Medicine

## 2023-02-04 ENCOUNTER — Encounter: Payer: Self-pay | Admitting: Family Medicine

## 2023-02-04 VITALS — BP 112/76 | HR 76 | Temp 98.0°F | Resp 16 | Ht 62.0 in | Wt 216.0 lb

## 2023-02-04 DIAGNOSIS — M609 Myositis, unspecified: Secondary | ICD-10-CM

## 2023-02-04 DIAGNOSIS — Z23 Encounter for immunization: Secondary | ICD-10-CM

## 2023-02-04 DIAGNOSIS — R7401 Elevation of levels of liver transaminase levels: Secondary | ICD-10-CM

## 2023-02-04 DIAGNOSIS — E1165 Type 2 diabetes mellitus with hyperglycemia: Secondary | ICD-10-CM | POA: Diagnosis not present

## 2023-02-04 DIAGNOSIS — M797 Fibromyalgia: Secondary | ICD-10-CM

## 2023-02-04 DIAGNOSIS — Z794 Long term (current) use of insulin: Secondary | ICD-10-CM

## 2023-02-04 DIAGNOSIS — R053 Chronic cough: Secondary | ICD-10-CM | POA: Insufficient documentation

## 2023-02-04 DIAGNOSIS — T466X5A Adverse effect of antihyperlipidemic and antiarteriosclerotic drugs, initial encounter: Secondary | ICD-10-CM

## 2023-02-04 MED ORDER — OMEPRAZOLE 40 MG PO CPDR
40.0000 mg | DELAYED_RELEASE_CAPSULE | Freq: Every day | ORAL | 2 refills | Status: DC
  Filled 2023-10-06: qty 90, 90d supply, fill #0

## 2023-02-04 MED ORDER — DULOXETINE HCL 30 MG PO CPEP: 30.0000 mg | ORAL_CAPSULE | Freq: Every day | ORAL | 3 refills | Status: DC

## 2023-02-04 MED ORDER — MOMETASONE FUROATE 50 MCG/ACT NA SUSP
2.0000 | Freq: Every day | NASAL | 12 refills | Status: DC
Start: 2023-02-04 — End: 2023-10-07

## 2023-02-04 NOTE — Progress Notes (Signed)
Subjective:   Chief Complaint  Patient presents with   Follow-up    Follow up    Kara Lee is a 77 y.o. female here for follow-up of diabetes.   Kara Lee does not check her sugars routinely.  Patient does not require insulin.   Medications include: Ozempic 0.5 mg/week, metformin XR 500 mg bid Diet is OK.  Exercise: active at home. No CP or SOB.   FM Patient has a history of fibromyalgia.  She is currently taking Cymbalta 20 mg daily and we are decreasing her Prozac, she is currently taking 20 mg daily as well.  She reports her symptoms have worsened.  She wonders if increasing the dosage would help things.  She has not been as active lately.  No recent injury or change in activity otherwise.  Chronic cough 6 mo. feels when she has a tight bra or pushes on her stomach, and make things worse.  She takes omeprazole 40 mg daily as needed.  No association with positions or meals.  She sometimes feels drainage in the back of her throat.  She is not having any shortness of breath or wheezing.  Recent chest x-ray was unremarkable.  Past Medical History:  Diagnosis Date   Anxiety    Cardiac arrhythmia    CHF (congestive heart failure) (HCC)    Colon polyp    Complication of anesthesia    Concussion 11/09/2012   From a fall at home on deck   Depression    postpartum depression after 2nd child   Diabetes mellitus (HCC)    Diverticulosis    Fatty liver    GERD (gastroesophageal reflux disease)    HLD (hyperlipidemia)    Hypertension    Morbid obesity (HCC)    OSA on CPAP    uses CPAP nightly   Osteoarthritis of right knee 07/31/2013   PONV (postoperative nausea and vomiting)    Rosacea    Severe aortic stenosis    Spinal stenosis      Related testing: Retinal exam: Done Pneumovax: done  Objective:  BP 112/76 (BP Location: Left Arm, Patient Position: Sitting, Cuff Size: Normal)   Pulse 76   Temp 98 F (36.7 C) (Oral)   Resp 16   Ht 5\' 2"  (1.575 m)   Wt 216 lb (98 kg)    SpO2 96%   BMI 39.51 kg/m  General:  Well developed, well nourished, in no apparent distress Mouth: MMM, no pharyngeal exudate/drainage Lungs:  CTAB, no access msc use Cardio:  RRR, no bruits, no LE edema Psych: Age appropriate judgment and insight  Assessment:   Type 2 diabetes mellitus with hyperglycemia, without long-term current use of insulin (HCC) - Plan: Comprehensive metabolic panel, Lipid panel, Hemoglobin A1c  Fibromyalgia  Chronic cough - Plan: mometasone (NASONEX) 50 MCG/ACT nasal spray  Elevated AST (SGOT)  Statin-induced myositis  Need for influenza vaccination - Plan: Flu Vaccine Trivalent High Dose (Fluad)   Plan:   Chronic, unstable. Cont metformin XR 500 mg twice daily, Ozempic 0.5 mg weekly.  Counseled on diet and exercise. Chronic, unstable.  Increase Cymbalta from 20 mg daily to 30 mg daily.  Stop Prozac. F/u in 1 mo. Chronic, unstable.  Start steroid nasal spray to cover for upper airway cough syndrome.  Does not seem to be related to reflux and unlikely to be emphysema.  Could consider chronic bronchitis versus reversible airway disease if no improvement.  Offered referral to ENT for possible scope which she politely declined for  now. Will follow-up on elevated AST. Has tried several statins unsuccessfully with the cardiology team. Flu shot today. The patient voiced understanding and agreement to the plan.  Kara Roche [place], DO 02/04/23 3:14 PM

## 2023-02-04 NOTE — Patient Instructions (Addendum)
Give [place] 2-3 business days to get the results of your labs back.   Keep the diet clean and stay active.  Use your nasal spray daily.   We are done with the Prozac.   Let us know if you need anything.

## 2023-02-05 LAB — LIPID PANEL
Chol/HDL Ratio: 2.9 {ratio} (ref 0.0–4.4)
Cholesterol, Total: 150 mg/dL (ref 100–199)
HDL: 51 mg/dL (ref >39)
LDL Chol Calc (NIH): 76 mg/dL (ref 0–99)
Triglycerides: 130 mg/dL (ref 0–149)
VLDL Cholesterol Cal: 23 mg/dL (ref 5–40)

## 2023-02-05 LAB — COMPREHENSIVE METABOLIC PANEL
ALT: 24 [IU]/L (ref 0–32)
AST: 30 [IU]/L (ref 0–40)
Albumin: 4.4 g/dL (ref 3.8–4.8)
Alkaline Phosphatase: 76 [IU]/L (ref 44–121)
BUN/Creatinine Ratio: 16 (ref 12–28)
BUN: 11 mg/dL (ref 8–27)
Bilirubin Total: 0.4 mg/dL (ref 0.0–1.2)
CO2: 22 mmol/L (ref 20–29)
Calcium: 10 mg/dL (ref 8.7–10.3)
Chloride: 101 mmol/L (ref 96–106)
Creatinine, Ser: 0.69 mg/dL (ref 0.57–1.00)
Globulin, Total: 2.9 g/dL (ref 1.5–4.5)
Glucose: 104 mg/dL — ABNORMAL HIGH (ref 70–99)
Potassium: 4.9 mmol/L (ref 3.5–5.2)
Sodium: 139 mmol/L (ref 134–144)
Total Protein: 7.3 g/dL (ref 6.0–8.5)
eGFR: 91 mL/min/{1.73_m2} (ref >59)

## 2023-02-05 LAB — HEMOGLOBIN A1C
Est. average glucose Bld gHb Est-mCnc: 183 mg/dL
Hgb A1c MFr Bld: 8 % — ABNORMAL HIGH (ref 4.8–5.6)

## 2023-02-07 DIAGNOSIS — Z1231 Encounter for screening mammogram for malignant neoplasm of breast: Secondary | ICD-10-CM | POA: Diagnosis not present

## 2023-02-07 LAB — HM MAMMOGRAPHY

## 2023-02-10 ENCOUNTER — Encounter: Payer: Self-pay | Admitting: Family Medicine

## 2023-02-11 NOTE — Telephone Encounter (Signed)
Is this certain lots or all of the Duloxetine? Should pt speak to pharmacist? Do you suggest changing tx?

## 2023-02-12 ENCOUNTER — Encounter: Payer: Self-pay | Admitting: Family Medicine

## 2023-02-12 DIAGNOSIS — G4733 Obstructive sleep apnea (adult) (pediatric): Secondary | ICD-10-CM | POA: Diagnosis not present

## 2023-02-13 ENCOUNTER — Other Ambulatory Visit: Payer: Self-pay | Admitting: Family Medicine

## 2023-02-14 ENCOUNTER — Other Ambulatory Visit: Payer: Self-pay | Admitting: Family Medicine

## 2023-02-14 MED ORDER — TIRZEPATIDE 2.5 MG/0.5ML ~~LOC~~ SOAJ: 2.5000 mg | SUBCUTANEOUS | 0 refills | Status: AC

## 2023-02-14 MED ORDER — TIRZEPATIDE 5 MG/0.5ML ~~LOC~~ SOAJ: 5.0000 mg | SUBCUTANEOUS | 0 refills | Status: AC

## 2023-02-14 MED ORDER — TIRZEPATIDE 7.5 MG/0.5ML ~~LOC~~ SOAJ: 7.5000 mg | SUBCUTANEOUS | 0 refills | Status: DC

## 2023-02-20 ENCOUNTER — Other Ambulatory Visit: Payer: Self-pay | Admitting: Family Medicine

## 2023-02-20 DIAGNOSIS — L304 Erythema intertrigo: Secondary | ICD-10-CM

## 2023-02-25 ENCOUNTER — Encounter (INDEPENDENT_AMBULATORY_CARE_PROVIDER_SITE_OTHER): Payer: Medicare Other

## 2023-02-25 DIAGNOSIS — G4730 Sleep apnea, unspecified: Secondary | ICD-10-CM | POA: Diagnosis not present

## 2023-02-25 DIAGNOSIS — G4733 Obstructive sleep apnea (adult) (pediatric): Secondary | ICD-10-CM

## 2023-02-26 ENCOUNTER — Other Ambulatory Visit: Payer: Self-pay | Admitting: Family Medicine

## 2023-02-26 DIAGNOSIS — L304 Erythema intertrigo: Secondary | ICD-10-CM

## 2023-03-01 ENCOUNTER — Ambulatory Visit: Payer: Medicare Other | Admitting: Family Medicine

## 2023-03-01 DIAGNOSIS — G4733 Obstructive sleep apnea (adult) (pediatric): Secondary | ICD-10-CM

## 2023-03-04 ENCOUNTER — Encounter: Payer: Self-pay | Admitting: Cardiology

## 2023-03-04 ENCOUNTER — Encounter: Payer: Self-pay | Admitting: Internal Medicine

## 2023-03-04 DIAGNOSIS — G4733 Obstructive sleep apnea (adult) (pediatric): Secondary | ICD-10-CM

## 2023-03-04 NOTE — Telephone Encounter (Signed)
Based on recent home sleep test, okay to order DME [employer]-please replace old CPAP machine, auto 5-20, mask of choice, humidifier, supplies, Airview/card.  There will be appropriate follow-up timing for her to keep her late December office visit with me.

## 2023-03-04 NOTE — Telephone Encounter (Signed)
Please see mychart messages about pt's cpap and advise.

## 2023-03-25 DIAGNOSIS — M654 Radial styloid tenosynovitis [de Quervain]: Secondary | ICD-10-CM | POA: Diagnosis not present

## 2023-03-29 ENCOUNTER — Ambulatory Visit (HOSPITAL_COMMUNITY): Payer: Medicare Other | Attending: Cardiology

## 2023-03-29 DIAGNOSIS — I359 Nonrheumatic aortic valve disorder, unspecified: Secondary | ICD-10-CM | POA: Diagnosis not present

## 2023-03-29 DIAGNOSIS — Z952 Presence of prosthetic heart valve: Secondary | ICD-10-CM | POA: Diagnosis not present

## 2023-03-29 LAB — ECHOCARDIOGRAM COMPLETE
AR max vel: 0.95 cm2
AV Area VTI: 1.08 cm2
AV Area mean vel: 1.03 cm2
AV Mean grad: 15 mmHg
AV Peak grad: 30 mmHg
Ao pk vel: 2.74 m/s
Area-P 1/2: 2.19 cm2
S' Lateral: 3.4 cm

## 2023-04-05 DIAGNOSIS — G4733 Obstructive sleep apnea (adult) (pediatric): Secondary | ICD-10-CM | POA: Diagnosis not present

## 2023-04-06 ENCOUNTER — Ambulatory Visit: Payer: Medicare Other | Admitting: Family Medicine

## 2023-04-07 ENCOUNTER — Ambulatory Visit: Payer: Medicare Other | Admitting: Gastroenterology

## 2023-04-10 ENCOUNTER — Other Ambulatory Visit: Payer: Self-pay | Admitting: Family Medicine

## 2023-04-10 DIAGNOSIS — R809 Proteinuria, unspecified: Secondary | ICD-10-CM

## 2023-04-18 ENCOUNTER — Ambulatory Visit: Payer: Medicare Other | Admitting: Internal Medicine

## 2023-04-22 ENCOUNTER — Other Ambulatory Visit: Payer: Medicare Other

## 2023-04-22 ENCOUNTER — Encounter: Payer: Self-pay | Admitting: Family Medicine

## 2023-04-22 ENCOUNTER — Other Ambulatory Visit: Payer: Self-pay | Admitting: Family Medicine

## 2023-04-22 ENCOUNTER — Other Ambulatory Visit (HOSPITAL_BASED_OUTPATIENT_CLINIC_OR_DEPARTMENT_OTHER): Payer: Self-pay

## 2023-04-22 MED ORDER — TIRZEPATIDE 7.5 MG/0.5ML ~~LOC~~ SOAJ: 7.5000 mg | SUBCUTANEOUS | 0 refills | Status: DC

## 2023-04-22 MED ORDER — COMIRNATY 30 MCG/0.3ML IM SUSY
PREFILLED_SYRINGE | INTRAMUSCULAR | 0 refills | Status: DC
  Filled 2023-04-22: qty 0.3, 1d supply, fill #0

## 2023-04-22 MED ORDER — TIRZEPATIDE 2.5 MG/0.5ML ~~LOC~~ SOAJ
2.5000 mg | SUBCUTANEOUS | 0 refills | Status: AC
Start: 2023-04-22 — End: 2023-05-20

## 2023-04-22 MED ORDER — TIRZEPATIDE 5 MG/0.5ML ~~LOC~~ SOAJ: 5.0000 mg | SUBCUTANEOUS | 0 refills | Status: AC

## 2023-04-26 ENCOUNTER — Encounter: Payer: Self-pay | Admitting: Cardiology

## 2023-04-26 ENCOUNTER — Telehealth: Payer: Self-pay

## 2023-04-26 ENCOUNTER — Other Ambulatory Visit: Payer: Self-pay | Admitting: Cardiology

## 2023-04-26 ENCOUNTER — Other Ambulatory Visit: Payer: Self-pay

## 2023-04-26 DIAGNOSIS — E7800 Pure hypercholesterolemia, unspecified: Secondary | ICD-10-CM

## 2023-04-26 DIAGNOSIS — I6523 Occlusion and stenosis of bilateral carotid arteries: Secondary | ICD-10-CM

## 2023-04-26 MED ORDER — ZYPITAMAG 4 MG PO TABS: 1.0000 | ORAL_TABLET | Freq: Every day | ORAL | 2 refills | Status: DC

## 2023-04-26 NOTE — Telephone Encounter (Signed)
 This Pt of Dr. Jacinto Halim is asking for a refill of Zypitamag. Is this something that he'd like to refill? Please advise

## 2023-04-28 ENCOUNTER — Ambulatory Visit: Payer: Medicare Other | Admitting: Cardiology

## 2023-05-02 ENCOUNTER — Encounter: Payer: Self-pay | Admitting: Family Medicine

## 2023-05-04 DIAGNOSIS — G4733 Obstructive sleep apnea (adult) (pediatric): Secondary | ICD-10-CM | POA: Diagnosis not present

## 2023-05-05 ENCOUNTER — Encounter: Payer: Self-pay | Admitting: Family Medicine

## 2023-05-06 DIAGNOSIS — G4733 Obstructive sleep apnea (adult) (pediatric): Secondary | ICD-10-CM | POA: Diagnosis not present

## 2023-05-09 ENCOUNTER — Encounter: Payer: Self-pay | Admitting: Family Medicine

## 2023-05-10 ENCOUNTER — Other Ambulatory Visit: Payer: Self-pay

## 2023-05-10 ENCOUNTER — Telehealth: Payer: Self-pay

## 2023-05-10 DIAGNOSIS — M8580 Other specified disorders of bone density and structure, unspecified site: Secondary | ICD-10-CM

## 2023-05-10 NOTE — Telephone Encounter (Signed)
Called pt spoke with her about her last bone density  and she stated last was in 2021, pt was ok with is [place] ordering it. Bone density order was placed.

## 2023-05-16 ENCOUNTER — Encounter: Payer: Medicare Other | Admitting: Family Medicine

## 2023-06-03 ENCOUNTER — Ambulatory Visit: Payer: Medicare Other | Admitting: Family Medicine

## 2023-06-03 ENCOUNTER — Ambulatory Visit: Payer: Self-pay | Admitting: Family Medicine

## 2023-06-03 VITALS — BP 110/60 | HR 67 | Temp 98.6°F | Resp 18 | Ht 62.0 in | Wt 215.2 lb

## 2023-06-03 DIAGNOSIS — E1165 Type 2 diabetes mellitus with hyperglycemia: Secondary | ICD-10-CM

## 2023-06-03 DIAGNOSIS — Z7985 Long-term (current) use of injectable non-insulin antidiabetic drugs: Secondary | ICD-10-CM

## 2023-06-03 DIAGNOSIS — I10 Essential (primary) hypertension: Secondary | ICD-10-CM | POA: Diagnosis not present

## 2023-06-03 LAB — GLUCOSE, POCT (MANUAL RESULT ENTRY): POC Glucose: 186 mg/dL — AB (ref 70–99)

## 2023-06-03 MED ORDER — LANCET DEVICE MISC: 1.0000 | Freq: Three times a day (TID) | 0 refills | Status: AC

## 2023-06-03 MED ORDER — LANCETS MISC. MISC: 1.0000 | Freq: Three times a day (TID) | 0 refills | Status: AC

## 2023-06-03 MED ORDER — INSULIN REGULAR HUMAN 100 UNIT/ML IJ SOLN
40.0000 [IU] | Freq: Every day | INTRAMUSCULAR | 11 refills | Status: DC
  Filled 2023-08-11 – 2023-10-06 (×2): qty 10, 25d supply, fill #0
  Filled 2023-11-01: qty 10, 25d supply, fill #1
  Filled 2024-01-06: qty 10, 25d supply, fill #2

## 2023-06-03 MED ORDER — BLOOD GLUCOSE TEST VI STRP: 1.0000 | ORAL_STRIP | Freq: Three times a day (TID) | 0 refills | Status: AC

## 2023-06-03 MED ORDER — INSULIN GLARGINE 100 UNIT/ML ~~LOC~~ SOLN: SUBCUTANEOUS | 11 refills | Status: DC

## 2023-06-03 MED ORDER — BLOOD GLUCOSE MONITORING SUPPL DEVI: 1.0000 | Freq: Three times a day (TID) | 0 refills | Status: DC

## 2023-06-03 NOTE — Progress Notes (Signed)
 Established Patient Office Visit  Subjective   Patient ID: Kara Lee, female    DOB: 1945-08-28  Age: 78 y.o. MRN: [id number]  Chief Complaint  Patient presents with   Diabetes    HPI Discussed the use of AI scribe software for clinical note transcription with the patient, who gave verbal consent to proceed.  History of Present Illness   Kara Lee is a 78 year old female with diabetes who presents with concerns about blood sugar management.  She has noted elevated blood sugar levels, with a home reading of 225 mg/dL an hour before the visit and 186 mg/dL in the office. She uses an older glucometer and is considering purchasing a new one.  She is not currently taking Mounjaro due to personal circumstances related to her husband's health but plans to start it soon. She previously used Ozempic, which caused significant nausea and constipation, and is worried about similar side effects with Mounjaro. She is currently on metformin for diabetes management.  She has experience with insulin injections, having used both long-acting and short-acting insulins in the past, especially during periods of elevated blood sugar due to corticosteroid injections for back pain.  Her husband's multiple health issues, including a stroke, RSV, and pacemaker placement, have impacted her ability to focus on her own health. He is currently in rehabilitation.      Patient Active Problem List   Diagnosis Date Noted   Chronic cough 02/04/2023   Statin-induced myositis 02/04/2023   Polyp of colon 01/06/2022   Fibromyalgia 11/25/2020   Essential hypertension 03/03/2020   GAD (generalized anxiety disorder) 03/03/2020   Depression, major, single episode, moderate (HCC) 02/11/2020   Type 2 diabetes mellitus with obesity (HCC) 10/24/2019   Proteinuria 10/24/2019   S/P TAVR (transcatheter aortic valve replacement) Transcatheter Aortic Valve Replacement - Percutaneous Transfemoral Approach Edwards Sapien  3 Ultra THV (size 26 mm ) 01/30/2019 01/30/2019   Diabetes mellitus (HCC)    GERD (gastroesophageal reflux disease)    HLD (hyperlipidemia)    OSA on CPAP 10/02/2017   Morbid obesity (HCC) 10/02/2017   Severe aortic stenosis    Osteoarthritis of right knee 07/31/2013   Past Medical History:  Diagnosis Date   Anxiety    Cardiac arrhythmia    CHF (congestive heart failure) (HCC)    Colon polyp    Complication of anesthesia    Concussion 11/09/2012   From a fall at home on deck   Depression    postpartum depression after 2nd child   Diabetes mellitus (HCC)    Diverticulosis    Fatty liver    GERD (gastroesophageal reflux disease)    HLD (hyperlipidemia)    Hypertension    Morbid obesity (HCC)    OSA on CPAP    uses CPAP nightly   Osteoarthritis of right knee 07/31/2013   PONV (postoperative nausea and vomiting)    Rosacea    Severe aortic stenosis    Spinal stenosis    Past Surgical History:  Procedure Laterality Date   ABDOMINAL HYSTERECTOMY     still has uterus   ANKLE RECONSTRUCTION Left    ARTERY BIOPSY Left 01/21/2020   Procedure: BIOPSY TEMPORAL ARTERY;  Surgeon: Newman Pies, MD;  Location: [facility];  Service: ENT;  Laterality: Left;   BIOPSY BREAST Bilateral    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   CESAREAN SECTION     x 2   CHOLECYSTECTOMY     COLONOSCOPY  04/21/2016   Single small external hemorrhoid, small internal hemorrhoids, mild sigmoid diverticulosis, 8 mm rectal polyp removed with snare   EYE SURGERY     lasik; X3 for crossed eyes; 1 eyelid surgery   INCISIONAL HERNIA REPAIR     PARTIAL KNEE ARTHROPLASTY Right 07/31/2013   Procedure: RIGHT UNICOMPARTMENTAL KNEE;  Surgeon: Eulas Post, MD;  Location: [facility] OR;  Service: Orthopedics;  Laterality: Right;   RIGHT/LEFT HEART CATH AND CORONARY ANGIOGRAPHY N/A 10/04/2017   Procedure: RIGHT/LEFT HEART CATH AND CORONARY ANGIOGRAPHY;  Surgeon: Yates Decamp, MD;  Location: [facility] INVASIVE CV  LAB;  Service: Cardiovascular;  Laterality: N/A;   TEE WITHOUT CARDIOVERSION N/A 08/31/2017   Procedure: TRANSESOPHAGEAL ECHOCARDIOGRAM (TEE);  Surgeon: Yates Decamp, MD;  Location: [facility] ENDOSCOPY;  Service: Cardiovascular;  Laterality: N/A;   TEE WITHOUT CARDIOVERSION N/A 01/30/2019   Procedure: TRANSESOPHAGEAL ECHOCARDIOGRAM (TEE);  Surgeon: Tonny Bollman, MD;  Location[facility] INVASIVE CV LAB;  Service: Open Heart Surgery;  Laterality: N/A;   TRANSCATHETER AORTIC VALVE REPLACEMENT, TRANSFEMORAL N/A 01/30/2019   Procedure: TRANSCATHETER AORTIC VALVE REPLACEMENT, TRANSFEMORAL;  Surgeon: Tonny Bollman, MD;  Location: [facility] INVASIVE CV LAB;  Service: Open Heart Surgery;  Laterality: N/A;   TUBAL LIGATION     Social History   Tobacco Use   Smoking status: Never   Smokeless tobacco: Never  Vaping Use   Vaping status: Never Used  Substance Use Topics   Alcohol use: Not Currently   Drug use: No   Social History   Socioeconomic History   Marital status: Married    Spouse name: Not on file   Number of children: 2   Years of education: Not on file   Highest education level: 12th grade  Occupational History   Occupation: retired  Tobacco Use   Smoking status: Never   Smokeless tobacco: Never  Vaping Use   Vaping status: Never Used  Substance and Sexual Activity   Alcohol use: Not Currently   Drug use: No   Sexual activity: Not Currently    Partners: Male    Birth control/protection: Surgical  Other Topics Concern   Not on file  Social History Narrative   Not on file   Social Drivers of Health   Financial Resource Strain: Low Risk  (06/03/2023)   Overall Financial Resource Strain (CARDIA)    Difficulty of Paying Living Expenses: Not very hard  Food Insecurity: No Food Insecurity (06/03/2023)   Hunger Vital Sign    Worried About Running Out of Food in the Last Year: Never true    Ran Out of Food in the Last Year: Never true  Transportation Needs: No Transportation Needs (06/03/2023)    PRAPARE - Administrator, Civil Service (Medical): No    Lack of Transportation (Non-Medical): No  Physical Activity: Insufficiently Active (06/03/2023)   Exercise Vital Sign    Days of Exercise per Week: 1 day    Minutes of Exercise per Session: 10 min  Stress: Stress Concern Present (06/03/2023)   [employer] of Occupational Health - Occupational Stress Questionnaire    Feeling of Stress : Rather much  Social Connections: Moderately Isolated (06/03/2023)   Social Connection and Isolation Panel [NHANES]    Frequency of Communication with Friends and Family: Twice a week    Frequency of Social Gatherings with Friends and Family: Once a week    Attends Religious Services: Never    Database administrator or Organizations: No    Attends Banker Meetings:  Not on file    Marital Status: Married  Intimate Partner Violence: Unknown (03/23/2022)   Received from [employer], [facility]   HITS    Physically Hurt: Not on file    Insult or Talk Down To: Not on file    Threaten Physical Harm: Not on file    Scream or Curse: Not on file   Family Status  Relation Name Status   Mother  Deceased   Father  Deceased   Sister 1/2 Deceased   MGM  Deceased   MGF  (Not Specified)   Son  Alive   Daughter  Alive   Neg Hx  (Not Specified)  No partnership data on file   Family History  Problem Relation Age of Onset   Pancreatitis Mother    COPD Mother    Diabetes type II Mother    Cirrhosis Sister        NASH   Allergic rhinitis Maternal Grandmother    Breast cancer Maternal Grandmother    Cataracts Maternal Grandfather    Prostate cancer Maternal Grandfather    Heart disease Maternal Grandfather    Asthma Son    Kidney Stones Daughter    Immunodeficiency Neg Hx    Angioedema Neg Hx    Eczema Neg Hx    Urticaria Neg Hx    Allergies  Allergen Reactions   Bee Venom Itching and Swelling   Latex Itching and Swelling   Lovenox [Enoxaparin Sodium] Other  (See Comments)    Bruising   Minocycline Nausea And Vomiting   Morphine And Codeine Nausea And Vomiting   Nickel Other (See Comments)    Contact dermatitis    Shellfish Allergy Itching   Statins Other (See Comments)    Muscle aches   Sulfa Antibiotics Nausea And Vomiting   Tape Itching    Bandaids and adhesive tape   Cipro [Ciprofloxacin Hcl] Rash   Glimepiride Rash   Metronidazole Rash      Review of Systems  Constitutional:  Negative for fever and malaise/fatigue.  HENT:  Negative for congestion.   Eyes:  Negative for blurred vision.  Respiratory:  Negative for cough and shortness of breath.   Cardiovascular:  Negative for chest pain, palpitations and leg swelling.  Gastrointestinal:  Negative for vomiting.  Musculoskeletal:  Negative for back pain.  Skin:  Negative for rash.  Neurological:  Negative for loss of consciousness and headaches.      Objective:     BP 110/60 (BP Location: Left Arm, Patient Position: Sitting)   Pulse 67   Temp 98.6 F (37 C) (Oral)   Resp 18   Ht 5\' 2"  (1.575 m)   Wt 215 lb 3.2 oz (97.6 kg)   SpO2 96%   BMI 39.36 kg/m  BP Readings from Last 3 Encounters:  06/03/23 110/60  02/04/23 112/76  01/18/23 106/74   Wt Readings from Last 3 Encounters:  06/03/23 215 lb 3.2 oz (97.6 kg)  02/04/23 216 lb (98 kg)  01/18/23 217 lb 6.4 oz (98.6 kg)   SpO2 Readings from Last 3 Encounters:  06/03/23 96%  02/04/23 96%  01/18/23 96%      Physical Exam Vitals and nursing note reviewed.  Constitutional:      General: She is not in acute distress.    Appearance: Normal appearance. She is well-developed.  HENT:     Head: Normocephalic and atraumatic.  Eyes:     General: No scleral icterus.       Right  eye: No discharge.        Left eye: No discharge.  Cardiovascular:     Rate and Rhythm: Normal rate and regular rhythm.     Heart sounds: No murmur heard. Pulmonary:     Effort: Pulmonary effort is normal. No respiratory distress.      Breath sounds: Normal breath sounds.  Musculoskeletal:        General: Normal range of motion.     Cervical back: Normal range of motion and neck supple.     Right lower leg: No edema.     Left lower leg: No edema.  Skin:    General: Skin is warm and dry.  Neurological:     Mental Status: She is alert and oriented to person, place, and time.  Psychiatric:        Mood and Affect: Mood normal.        Behavior: Behavior normal.        Thought Content: Thought content normal.        Judgment: Judgment normal.      Results for orders placed or performed in visit on 06/03/23  POCT glucose (manual entry)  Result Value Ref Range   POC Glucose 186 (A) 70 - 99 mg/dl    Last CBC Lab Results  Component Value Date   WBC 9.7 12/20/2022   HGB 12.2 12/20/2022   HCT 38.2 12/20/2022   MCV 90.3 12/20/2022   MCH 28.8 12/20/2022   RDW 12.9 12/20/2022   PLT 150 12/20/2022   Last metabolic panel Lab Results  Component Value Date   GLUCOSE 104 (H) 02/04/2023   NA 139 02/04/2023   K 4.9 02/04/2023   CL 101 02/04/2023   CO2 22 02/04/2023   BUN 11 02/04/2023   CREATININE 0.69 02/04/2023   EGFR 91 02/04/2023   CALCIUM 10.0 02/04/2023   PROT 7.3 02/04/2023   ALBUMIN 4.4 02/04/2023   LABGLOB 2.9 02/04/2023   BILITOT 0.4 02/04/2023   ALKPHOS 76 02/04/2023   AST 30 02/04/2023   ALT 24 02/04/2023   ANIONGAP 16 (H) 12/20/2022   Last lipids Lab Results  Component Value Date   CHOL 150 02/04/2023   HDL 51 02/04/2023   LDLCALC 76 02/04/2023   TRIG 130 02/04/2023   CHOLHDL 2.9 02/04/2023   Last hemoglobin A1c Lab Results  Component Value Date   HGBA1C 8.0 (H) 02/04/2023   Last thyroid functions Lab Results  Component Value Date   TSH 1.248 12/20/2022   Last vitamin D Lab Results  Component Value Date   VD25OH 27.06 (L) 07/15/2020   Last vitamin B12 and Folate No results found for: "VITAMINB12", "FOLATE"    The 10-year ASCVD risk score (Arnett DK, et al., 2019) is:  27.9%    Assessment & Plan:   Problem List Items Addressed This Visit       Unprioritized   Diabetes mellitus (HCC) - Primary   Relevant Medications   Blood Glucose Monitoring Suppl DEVI   Glucose Blood (BLOOD GLUCOSE TEST STRIPS) STRP   Lancet Device MISC   Lancets Misc. MISC   insulin regular (NOVOLIN R) 100 units/mL injection   insulin glargine (LANTUS) 100 UNIT/ML injection   Other Relevant Orders   POCT glucose (manual entry) (Completed)   Urine Microalbumin w/creat. ratio  Assessment and Plan    Type 2 Diabetes Mellitus Blood sugar management is a concern with recent readings of 225 mg/dL at home and 161 mg/dL in-office. The last HbA1c was 8%.  Greggory Keen will be started today after a delay due to personal circumstances. Previous use of Ozempic resulted in significant gastrointestinal side effects. Alternatives were discussed if Greggory Keen is not tolerated. Prescribe Mounjaro to be taken once weekly on Fridays, starting with the lowest dose and monitoring for side effects. A new glucometer prescription will be sent to [facility]. Blood sugar should be checked four times daily, including fasting and two hours post-meals. Prescribe Novolin R for sliding scale insulin based on blood sugar readings and Lantus at 10 units once daily if levels remain high. Educate on the use of sliding scale and long-acting insulin. Follow up with the endocrinologist next week.  Hypertension Blood pressure is well-controlled. Continue the current antihypertensive regimen.  General Health Maintenance A new glucometer is needed as the current one may be unreliable. Medicare may cover a new glucometer every year or two. A prescription will be sent to [facility].  Follow-up Follow up with the endocrinologist next week. Monitor blood sugar levels and report to the endocrinologist.        No follow-ups on file.    Donato Schultz, DO

## 2023-06-03 NOTE — Telephone Encounter (Signed)
Copied From CRM [id number]. Reason for Triage: Patient tested her blood sugar at 1 0am and it read 169, patient took insulin and retested at  1pm and it read 255. Patient is experiencing heightened  anxiety and vision problems.  Chief Complaint: bgl 255 Symptoms: states eyes feel funny at times and has anxiety, husband has been very ill and in hospital.  Frequency: today; patient just started checking her blood glucose levels Pertinent Negatives: Patient denies pain with urination, fever, cold like symptoms Disposition: [] ED /[] Urgent Care (no appt availability in office) / [x] Appointment(In office/virtual)/ []  [facility]/ [] Home Care/ [] Refused Recommended Disposition /[] [facility]/ []  Follow-up with PCP Additional Notes: patient states her medication is out dated and would like new rx's.  Apt made for today; care advice given, denies questions, instructed to go to the er if becomes worse.     Reason for Disposition  [1] Symptoms of high blood sugar (e.g., abnormally thirsty, frequent urination, weight loss) AND [2] not able to test blood glucose AND [3] pregnant  Answer Assessment - Initial Assessment Questions 1. BLOOD GLUCOSE: "What is your blood glucose level?"      255 2. ONSET: "When did you check the blood glucose?"     Today at 1 pm 3. USUAL RANGE: "What is your glucose level usually?" (e.g., usual fasting morning value, usual evening value)     Just started taking bgl 4. KETONES: "Do you check for ketones (urine or blood test strips)?" If Yes, ask: "What does the test show now?"      denies 5. TYPE 1 or 2:  "Do you know what type of diabetes you have?"  (e.g., Type 1, Type 2, Gestational; doesn't know)      Type 2 6. INSULIN: "Do you take insulin?" "What type of insulin(s) do you use? What is the mode of delivery? (syringe, pen; injection or pump)?"      yes 7. DIABETES PILLS: "Do you take any pills for your diabetes?" If Yes, ask: "Have you missed  taking any pills recently?"     metformin 8. OTHER SYMPTOMS: "Do you have any symptoms?" (e.g., fever, frequent urination, difficulty breathing, dizziness, weakness, vomiting)     Frequent urination and eyes blurry.  9. PREGNANCY: "Is there any chance you are pregnant?" "When was your last menstrual period?"     na  Protocols used: Diabetes - High Blood Sugar-A-AH

## 2023-06-04 DIAGNOSIS — G4733 Obstructive sleep apnea (adult) (pediatric): Secondary | ICD-10-CM | POA: Diagnosis not present

## 2023-06-06 DIAGNOSIS — G4733 Obstructive sleep apnea (adult) (pediatric): Secondary | ICD-10-CM | POA: Diagnosis not present

## 2023-06-07 ENCOUNTER — Telehealth: Payer: Self-pay

## 2023-06-07 ENCOUNTER — Other Ambulatory Visit (HOSPITAL_BASED_OUTPATIENT_CLINIC_OR_DEPARTMENT_OTHER): Payer: Self-pay

## 2023-06-07 ENCOUNTER — Other Ambulatory Visit: Payer: Self-pay

## 2023-06-07 DIAGNOSIS — E1165 Type 2 diabetes mellitus with hyperglycemia: Secondary | ICD-10-CM

## 2023-06-07 MED ORDER — INSULIN GLARGINE 100 UNIT/ML ~~LOC~~ SOLN
10.0000 [IU] | Freq: Every day | SUBCUTANEOUS | 11 refills | Status: DC
  Filled 2023-06-07: qty 10, 28d supply, fill #0
  Filled 2023-08-11: qty 10, 28d supply, fill #1

## 2023-06-07 NOTE — Telephone Encounter (Signed)
 Called spoke with pt and she not able to get her  LANTUS and we sent it in to [facility] to let us know if they have it. Copied from CRM [id number]. Topic: Clinical - Prescription Issue >> Jun 07, 2023  1:29 PM Tiffany H wrote: Reason for CRM: Patient called to advise that she's been having trouble securing insulin glargine (LANTUS) 100 UNIT/ML injection [[id number]].   Please assist. Patient would like to know if there is another way to dispense this medication. Patient is open to going to other pharmacies.

## 2023-06-08 ENCOUNTER — Other Ambulatory Visit: Payer: Self-pay | Admitting: Family Medicine

## 2023-06-09 ENCOUNTER — Other Ambulatory Visit (HOSPITAL_BASED_OUTPATIENT_CLINIC_OR_DEPARTMENT_OTHER): Payer: Self-pay

## 2023-06-09 MED ORDER — FLUOXETINE HCL 20 MG PO CAPS: 20.0000 mg | ORAL_CAPSULE | Freq: Every day | ORAL | 3 refills | Status: DC

## 2023-06-09 MED ORDER — METHOCARBAMOL 500 MG PO TABS: 500.0000 mg | ORAL_TABLET | Freq: Four times a day (QID) | ORAL | 1 refills | Status: DC | PRN

## 2023-06-09 MED ORDER — FLUOXETINE HCL 40 MG PO CAPS: 40.0000 mg | ORAL_CAPSULE | Freq: Every day | ORAL | 3 refills | Status: DC

## 2023-06-10 ENCOUNTER — Ambulatory Visit: Payer: Medicare Other | Admitting: Family Medicine

## 2023-06-17 ENCOUNTER — Other Ambulatory Visit (HOSPITAL_BASED_OUTPATIENT_CLINIC_OR_DEPARTMENT_OTHER): Payer: Self-pay

## 2023-06-17 ENCOUNTER — Ambulatory Visit: Payer: Medicare Other | Admitting: Family Medicine

## 2023-06-17 ENCOUNTER — Other Ambulatory Visit: Payer: Medicare Other

## 2023-06-17 ENCOUNTER — Encounter: Payer: Self-pay | Admitting: Family Medicine

## 2023-06-17 VITALS — BP 134/88 | HR 69 | Temp 97.6°F | Resp 20 | Ht 62.0 in | Wt 211.2 lb

## 2023-06-17 DIAGNOSIS — M797 Fibromyalgia: Secondary | ICD-10-CM

## 2023-06-17 DIAGNOSIS — E1165 Type 2 diabetes mellitus with hyperglycemia: Secondary | ICD-10-CM

## 2023-06-17 MED ORDER — PREDNISONE 10 MG PO TABS
ORAL_TABLET | ORAL | 0 refills | Status: DC
  Filled 2023-06-17: qty 21, 6d supply, fill #0

## 2023-06-17 MED ORDER — DULOXETINE HCL 60 MG PO CPEP
60.0000 mg | ORAL_CAPSULE | Freq: Every day | ORAL | 3 refills | Status: DC
Start: 2023-06-17 — End: 2023-08-15
  Filled 2023-06-17: qty 30, 30d supply, fill #0
  Filled 2023-07-12: qty 30, 30d supply, fill #1
  Filled 2023-08-11: qty 30, 30d supply, fill #2

## 2023-06-17 NOTE — Patient Instructions (Addendum)
 OK to take Tylenol 1000 mg (2 extra strength tabs) or 975 mg (3 regular strength tabs) every 6 hours as needed.  Ice/cold pack over area for 10-15 min twice daily.  Heat (pad or rice pillow in microwave) over affected area, 10-15 minutes twice daily.   Aim to do some physical exertion for 150 minutes per week. This is typically divided into 5 days per week, 30 minutes per day. The activity should be enough to get your heart rate up. Anything is better than nothing if you have time constraints.  Let us know if you need anything.

## 2023-06-17 NOTE — Progress Notes (Signed)
 Chief Complaint  Patient presents with   Medication Review    Patient presents today for medication review.   Anxiety    Patient reports increased anxiety since 04/2023. She reports taking care of her husband.    Subjective: Patient is a 78 y.o. female here for f/u.  Pt has a hx of FM. Stress levels have been higher since her husband had a stroke and she has been taking care of him at a much higher level. She is compliant with Cymbalta 30 mg/d. She has no AE's.   Past Medical History:  Diagnosis Date   Anxiety    Cardiac arrhythmia    CHF (congestive heart failure) (HCC)    Colon polyp    Complication of anesthesia    Concussion 11/09/2012   From a fall at home on deck   Depression    postpartum depression after 2nd child   Diabetes mellitus (HCC)    Diverticulosis    Fatty liver    GERD (gastroesophageal reflux disease)    HLD (hyperlipidemia)    Hypertension    Morbid obesity (HCC)    OSA on CPAP    uses CPAP nightly   Osteoarthritis of right knee 07/31/2013   PONV (postoperative nausea and vomiting)    Rosacea    Severe aortic stenosis    Spinal stenosis     Objective: BP 134/88   Pulse 69   Temp 97.6 F (36.4 C)   Resp 20   Ht 5\' 2"  (1.575 m)   Wt 211 lb 3.2 oz (95.8 kg)   SpO2 97%   BMI 38.63 kg/m  General: Awake, appears stated age MSK: +ttp over upper, mid, and lower back region, UE's and LE's b/l Lungs: No accessory muscle use Psych: Age appropriate judgment and insight, normal affect and mood  Assessment and Plan: Fibromyalgia - Plan: predniSONE (DELTASONE) 10 MG tablet, DULoxetine (CYMBALTA) 60 MG capsule  Chronic, uncontrolled. 6 d pred pack/taper as this has helped before. Increase Cymbalta to 60 mg/d. F/u in 1 mo. Increase physical activity.  The patient voiced understanding and agreement to the plan.  Jilda Roche [place], DO 06/17/23  3:29 PM

## 2023-06-17 NOTE — Progress Notes (Signed)
 Specimen provided

## 2023-06-18 LAB — MICROALBUMIN / CREATININE URINE RATIO
Creatinine, Urine: 137 mg/dL (ref 20–275)
Microalb Creat Ratio: 31 mg/g{creat} — ABNORMAL HIGH (ref <30)
Microalb, Ur: 4.2 mg/dL

## 2023-06-21 ENCOUNTER — Ambulatory Visit: Payer: Medicare Other | Admitting: Internal Medicine

## 2023-06-21 ENCOUNTER — Other Ambulatory Visit (HOSPITAL_BASED_OUTPATIENT_CLINIC_OR_DEPARTMENT_OTHER): Payer: Self-pay

## 2023-06-21 ENCOUNTER — Encounter: Payer: Self-pay | Admitting: Family Medicine

## 2023-06-21 MED ORDER — TIRZEPATIDE 2.5 MG/0.5ML ~~LOC~~ SOAJ
2.5000 mg | SUBCUTANEOUS | 0 refills | Status: DC
  Filled 2023-06-21: qty 2, 28d supply, fill #0

## 2023-06-21 NOTE — Addendum Note (Signed)
 Addended byConrad Wilton D on: 06/21/2023 01:03 PM   Modules accepted: Orders

## 2023-06-22 ENCOUNTER — Encounter: Payer: Self-pay | Admitting: Family Medicine

## 2023-06-22 ENCOUNTER — Other Ambulatory Visit (HOSPITAL_BASED_OUTPATIENT_CLINIC_OR_DEPARTMENT_OTHER): Payer: Self-pay

## 2023-06-29 ENCOUNTER — Encounter: Payer: Self-pay | Admitting: Family Medicine

## 2023-06-30 ENCOUNTER — Ambulatory Visit: Payer: Medicare Other | Admitting: Gastroenterology

## 2023-07-01 ENCOUNTER — Encounter: Payer: Self-pay | Admitting: Family Medicine

## 2023-07-01 ENCOUNTER — Other Ambulatory Visit (HOSPITAL_BASED_OUTPATIENT_CLINIC_OR_DEPARTMENT_OTHER): Payer: Self-pay

## 2023-07-02 DIAGNOSIS — G4733 Obstructive sleep apnea (adult) (pediatric): Secondary | ICD-10-CM | POA: Diagnosis not present

## 2023-07-04 DIAGNOSIS — G4733 Obstructive sleep apnea (adult) (pediatric): Secondary | ICD-10-CM | POA: Diagnosis not present

## 2023-07-05 ENCOUNTER — Other Ambulatory Visit (HOSPITAL_BASED_OUTPATIENT_CLINIC_OR_DEPARTMENT_OTHER): Payer: Self-pay

## 2023-07-07 ENCOUNTER — Encounter: Payer: Self-pay | Admitting: Family Medicine

## 2023-07-07 ENCOUNTER — Other Ambulatory Visit (HOSPITAL_BASED_OUTPATIENT_CLINIC_OR_DEPARTMENT_OTHER): Payer: Self-pay

## 2023-07-08 ENCOUNTER — Encounter: Payer: Self-pay | Admitting: Family Medicine

## 2023-07-12 ENCOUNTER — Other Ambulatory Visit (HOSPITAL_BASED_OUTPATIENT_CLINIC_OR_DEPARTMENT_OTHER): Payer: Self-pay

## 2023-07-12 MED FILL — Atenolol Tab 25 MG: ORAL | 90 days supply | Qty: 180 | Fill #0 | Status: AC

## 2023-07-12 MED FILL — Losartan Potassium Tab 25 MG: ORAL | 90 days supply | Qty: 90 | Fill #0 | Status: AC

## 2023-07-13 ENCOUNTER — Other Ambulatory Visit (HOSPITAL_BASED_OUTPATIENT_CLINIC_OR_DEPARTMENT_OTHER): Payer: Self-pay

## 2023-07-18 ENCOUNTER — Ambulatory Visit (INDEPENDENT_AMBULATORY_CARE_PROVIDER_SITE_OTHER): Admitting: Family Medicine

## 2023-07-18 ENCOUNTER — Encounter: Payer: Self-pay | Admitting: Family Medicine

## 2023-07-18 ENCOUNTER — Other Ambulatory Visit (HOSPITAL_BASED_OUTPATIENT_CLINIC_OR_DEPARTMENT_OTHER): Payer: Self-pay

## 2023-07-18 VITALS — BP 128/78 | HR 78 | Ht 62.0 in | Wt 209.0 lb

## 2023-07-18 DIAGNOSIS — M654 Radial styloid tenosynovitis [de Quervain]: Secondary | ICD-10-CM | POA: Diagnosis not present

## 2023-07-18 DIAGNOSIS — I480 Paroxysmal atrial fibrillation: Secondary | ICD-10-CM | POA: Diagnosis not present

## 2023-07-18 DIAGNOSIS — M797 Fibromyalgia: Secondary | ICD-10-CM

## 2023-07-18 DIAGNOSIS — E669 Obesity, unspecified: Secondary | ICD-10-CM | POA: Diagnosis not present

## 2023-07-18 DIAGNOSIS — E1169 Type 2 diabetes mellitus with other specified complication: Secondary | ICD-10-CM | POA: Diagnosis not present

## 2023-07-18 DIAGNOSIS — M65342 Trigger finger, left ring finger: Secondary | ICD-10-CM

## 2023-07-18 MED ORDER — TIRZEPATIDE 5 MG/0.5ML ~~LOC~~ SOAJ
5.0000 mg | SUBCUTANEOUS | 0 refills | Status: DC
  Filled 2023-07-18 (×2): qty 2, 28d supply, fill #0

## 2023-07-18 MED ORDER — TIRZEPATIDE 7.5 MG/0.5ML ~~LOC~~ SOAJ
7.5000 mg | SUBCUTANEOUS | 0 refills | Status: DC
  Filled 2023-07-18: qty 2, 28d supply, fill #0

## 2023-07-18 MED ORDER — METHYLPREDNISOLONE ACETATE 40 MG/ML IJ SUSP
40.0000 mg | Freq: Once | INTRAMUSCULAR | Status: AC
  Administered 2023-07-18: 40 mg via INTRAMUSCULAR

## 2023-07-18 MED ORDER — TIRZEPATIDE 10 MG/0.5ML ~~LOC~~ SOAJ
10.0000 mg | SUBCUTANEOUS | 0 refills | Status: DC
  Filled 2023-07-18: qty 2, 28d supply, fill #0

## 2023-07-18 MED ORDER — TRIAMCINOLONE ACETONIDE 0.1 % EX CREA
TOPICAL_CREAM | Freq: Two times a day (BID) | CUTANEOUS | 0 refills | Status: DC | PRN
  Filled 2023-07-18: qty 454, 30d supply, fill #0

## 2023-07-18 MED ORDER — APIXABAN 5 MG PO TABS
5.0000 mg | ORAL_TABLET | Freq: Two times a day (BID) | ORAL | 1 refills | Status: DC
  Filled 2023-07-18: qty 180, 90d supply, fill #0
  Filled 2023-08-11 – 2023-10-11 (×4): qty 180, 90d supply, fill #1

## 2023-07-18 NOTE — Progress Notes (Signed)
 Chief Complaint  Patient presents with   Follow-up    Patient presents today for a follow-up.    Subjective: Patient is a 78 y.o. female here for follow-up.  Patient has a history of fibromyalgia and her Cymbalta was recently increased from 30 mg daily to 60 mg daily.  She has had a lot of stress in her life lately with her husband's failing health.  In spite of that, she has noticed her mood has been relatively stable in addition to her pain.  She does not wish to make any changes at this time.  She denies any side effects and reports compliance with the medication.  Patient has a longstanding history of de Quervain's tenosynovitis.  She usually gets an injection is wondering if I can do it today.  No recent injury or change in activity.  She denies any redness, bruising, or swelling.  The patient has had some triggering of the left ring finger for the past several months.  No recent injury or change in activity.  Things seem to be getting worse.  She does have pain over her palm.  Denies any redness, bruising, or swelling.  She has had difficulty holding onto things with her left hand because of this.  Past Medical History:  Diagnosis Date   Anxiety    Cardiac arrhythmia    CHF (congestive heart failure) (HCC)    Colon polyp    Complication of anesthesia    Concussion 11/09/2012   From a fall at home on deck   Depression    postpartum depression after 2nd child   Diabetes mellitus (HCC)    Diverticulosis    Fatty liver    GERD (gastroesophageal reflux disease)    HLD (hyperlipidemia)    Hypertension    Morbid obesity (HCC)    OSA on CPAP    uses CPAP nightly   Osteoarthritis of right knee 07/31/2013   PONV (postoperative nausea and vomiting)    Rosacea    Severe aortic stenosis    Spinal stenosis     Objective: BP 128/78   Pulse 78   Ht 5\' 2"  (1.575 m)   Wt 209 lb (94.8 kg)   SpO2 96%   BMI 38.23 kg/m  General: Awake, appears stated age Heart: RRR, no LE  edema Lungs: CTAB, no rales, wheezes or rhonchi. No accessory muscle use MSK: TTP over the distal palmar aspect of the left hand, flexor tendon of the fourth digit.  No nodularity noted.  There is triggering of that finger as well.  On the right hand, the extensor tendon of the thumb is TTP without surrounding erythema, ecchymosis, or edema. Psych: Age appropriate judgment and insight, normal affect and mood  Procedure note: Trigger finger injection Verbal consent obtained. The area of interest was palpated and just distal over the palmar MCP crease, the tendon was marked with an otoscope speculum. The area marked by a speculum was cleaned with alcohol and freeze spray was used for topical anesthesia. A 27-gauge needle was inserted at a 45 degree angle and once under the skin, continued in parallel fashion to the tendon, and used to inject 20 mg of Depo-Medrol and 1 mg of 1% lidocaine without epinephrine.  The plunger of the syringe was withdrawn prior to injecting the medication to ensure the needle was not in a blood vessel. A Band-Aid was placed. The patient tolerated the procedure well with no immediate complications noted.  Procedure note: De Quervain's tenosynovitis injection Verbal consent  obtained. The area of interest was palpated and marked with a pen. The area marked was cleaned with alcohol and freeze spray was used for topical anesthesia. A 27-gauge needle was inserted at a 45 degree angle and once under the skin, continued in parallel fashion to the tendon, and used to inject 20 mg of Depo-Medrol and 0.5 mg of 2% lidocaine without epinephrine.  The plunger of the syringe was withdrawn prior to injecting the medication to ensure the needle was not in a blood vessel. A Band-Aid was placed. The patient tolerated the procedure well with no immediate complications noted.  Assessment and Plan: Fibromyalgia  Tenosynovitis, de Quervain - Plan: methylPREDNISolone acetate (DEPO-MEDROL)  injection 40 mg, PR INJECTION 1 TENDON SHEATH/LIGAMENT APONEUROSIS  Trigger ring finger of left hand - Plan: methylPREDNISolone acetate (DEPO-MEDROL) injection 40 mg, PR INJECTION 1 TENDON SHEATH/LIGAMENT APONEUROSIS  Paroxysmal atrial fibrillation (HCC) - Plan: apixaban (ELIQUIS) 5 MG TABS tablet  Type 2 diabetes mellitus with obesity (HCC) - Plan: Comprehensive metabolic panel with GFR, Lipid panel, Hemoglobin A1c  Chronic, relatively stable.  Continue Cymbalta 60 mg daily.  Counseled on exercise. Chronic, not controlled.  Injection today.  Consider thumb spica splint.  Heat, ice, Tylenol. Chronic, not controlled.  Injection today.  Heat, ice, Tylenol, consider splinting the finger. Follow-up pending the results of her labs. The patient voiced understanding and agreement to the plan.  Jilda Roche Erwin, DO 07/18/23  5:10 PM

## 2023-07-18 NOTE — Patient Instructions (Signed)
 We will leave things alone with your Cymbalta for now.   Ice/cold pack over area for 10-15 min twice daily.  OK to take Tylenol 1000 mg (2 extra strength tabs) or 975 mg (3 regular strength tabs) every 6 hours as needed.  Let us know if you need anything.

## 2023-07-19 ENCOUNTER — Other Ambulatory Visit (HOSPITAL_BASED_OUTPATIENT_CLINIC_OR_DEPARTMENT_OTHER): Payer: Self-pay

## 2023-07-19 ENCOUNTER — Encounter: Payer: Self-pay | Admitting: Family Medicine

## 2023-07-19 LAB — HEMOGLOBIN A1C: Hgb A1c MFr Bld: 7.8 % — ABNORMAL HIGH (ref 4.6–6.5)

## 2023-07-19 LAB — LIPID PANEL
Cholesterol: 150 mg/dL (ref 0–200)
HDL: 50.1 mg/dL (ref >39.00)
LDL Cholesterol: 71 mg/dL (ref 0–99)
NonHDL: 100.18
Total CHOL/HDL Ratio: 3
Triglycerides: 145 mg/dL (ref 0.0–149.0)
VLDL: 29 mg/dL (ref 0.0–40.0)

## 2023-07-19 LAB — COMPREHENSIVE METABOLIC PANEL WITH GFR
ALT: 14 U/L (ref 0–35)
AST: 19 U/L (ref 0–37)
Albumin: 4.5 g/dL (ref 3.5–5.2)
Alkaline Phosphatase: 68 U/L (ref 39–117)
BUN: 19 mg/dL (ref 6–23)
CO2: 25 meq/L (ref 19–32)
Calcium: 9.9 mg/dL (ref 8.4–10.5)
Chloride: 103 meq/L (ref 96–112)
Creatinine, Ser: 0.72 mg/dL (ref 0.40–1.20)
GFR: 81.81 mL/min (ref >60.00)
Glucose, Bld: 102 mg/dL — ABNORMAL HIGH (ref 70–99)
Potassium: 4.6 meq/L (ref 3.5–5.1)
Sodium: 137 meq/L (ref 135–145)
Total Bilirubin: 0.4 mg/dL (ref 0.2–1.2)
Total Protein: 7.7 g/dL (ref 6.0–8.3)

## 2023-07-22 ENCOUNTER — Encounter: Payer: Self-pay | Admitting: Family Medicine

## 2023-07-22 DIAGNOSIS — I447 Left bundle-branch block, unspecified: Secondary | ICD-10-CM | POA: Diagnosis not present

## 2023-07-25 ENCOUNTER — Encounter: Payer: Self-pay | Admitting: Family Medicine

## 2023-07-28 ENCOUNTER — Encounter: Payer: Self-pay | Admitting: Internal Medicine

## 2023-08-04 ENCOUNTER — Ambulatory Visit: Payer: Medicare Other | Admitting: Internal Medicine

## 2023-08-12 ENCOUNTER — Other Ambulatory Visit (HOSPITAL_BASED_OUTPATIENT_CLINIC_OR_DEPARTMENT_OTHER): Payer: Self-pay

## 2023-08-15 ENCOUNTER — Ambulatory Visit: Payer: Self-pay

## 2023-08-15 ENCOUNTER — Other Ambulatory Visit (HOSPITAL_BASED_OUTPATIENT_CLINIC_OR_DEPARTMENT_OTHER): Payer: Self-pay

## 2023-08-15 ENCOUNTER — Encounter: Payer: Self-pay | Admitting: Family Medicine

## 2023-08-15 ENCOUNTER — Ambulatory Visit (INDEPENDENT_AMBULATORY_CARE_PROVIDER_SITE_OTHER): Admitting: Family Medicine

## 2023-08-15 ENCOUNTER — Telehealth: Payer: Self-pay | Admitting: Cardiology

## 2023-08-15 VITALS — BP 124/74 | HR 81 | Temp 98.0°F | Resp 16 | Ht 62.0 in | Wt 204.0 lb

## 2023-08-15 DIAGNOSIS — H9193 Unspecified hearing loss, bilateral: Secondary | ICD-10-CM

## 2023-08-15 DIAGNOSIS — F321 Major depressive disorder, single episode, moderate: Secondary | ICD-10-CM | POA: Diagnosis not present

## 2023-08-15 DIAGNOSIS — M797 Fibromyalgia: Secondary | ICD-10-CM | POA: Diagnosis not present

## 2023-08-15 MED ORDER — TIRZEPATIDE 5 MG/0.5ML ~~LOC~~ SOAJ
5.0000 mg | SUBCUTANEOUS | 3 refills | Status: DC
  Filled 2023-08-15 (×3): qty 2, 28d supply, fill #0

## 2023-08-15 MED ORDER — DULOXETINE HCL 60 MG PO CPEP
60.0000 mg | ORAL_CAPSULE | Freq: Every day | ORAL | 1 refills | Status: DC
  Filled 2023-08-15 (×2): qty 90, 90d supply, fill #0
  Filled 2023-11-09: qty 90, 90d supply, fill #1

## 2023-08-15 MED FILL — Metformin HCl Tab ER 24HR 500 MG: ORAL | 90 days supply | Qty: 180 | Fill #0 | Status: AC

## 2023-08-15 NOTE — Patient Instructions (Addendum)
 Please consider counseling. Contact [phone] to schedule an appointment or inquire about cost/insurance coverage.  [facility] located at [address], [place], [place].  Phone number = [phone].  Dr. Dewight Fore - Adult Psychiatry.    [facility] located at [address] [place], [place], [place]. Phone number = [phone].   [facility] located at [address], [place], [place].  Phone number = [phone].   [facility] located at [address] [place], [place], [place].  Phone number = [phone].  Aim to do some physical exertion for 150 minutes per week. This is typically divided into 5 days per week, 30 minutes per day. The activity should be enough to get your heart rate up. Anything is better than nothing if you have time constraints.  Consider adding edamame or tofu into your diet. Soy milk is another thing which can help. Black cohosh (540 mg is a common dosage) can be helpful as well.   Let us  know if you need anything.

## 2023-08-15 NOTE — Telephone Encounter (Signed)
 Patient notified.  Appointment made in afib clinic for May 5 at 3:30

## 2023-08-15 NOTE — Telephone Encounter (Signed)
  Chief Complaint: Medication questions, tachycardia Symptoms:  tachycardia Frequency: intermittent Pertinent Negatives: Patient denies chest pain, shortness of breath Disposition: [] ED /[] Urgent Care (no appt availability in office) / [x] Appointment(In office/virtual)/ []  [place] [facility]/ [] Home Care/ [] Refused Recommended Disposition /[] [place] [facility]/ []  Follow-up with PCP Additional Notes:  Called in this morning to cancel spouses appointment, he is in the hospital, she is requesting this appointment for herself. She would like to see Dr. Gwenette Lennox to discuss her medicines and intermittent tachycardia. Heart rate was 144 one week ago. She has some dizziness when heart rate elevates. Not currently symptomatic. Scheduled acute visit with PCP today. Educated on care advice as documented in protocol, patient verbalized understanding. Discussed reasons to call back or call for EMS.    Copied from CRM [id number]. Topic: Clinical - Red Word Triage >> Aug 15, 2023  9:25 AM Rosamond Comes wrote: Red Word that prompted transfer to Nurse Triage: Patient calling in has heart issues that has heart issues, had heart episode on Sunday morning. And medication issues Reason for Disposition  [1] MILD dizziness (e.g., walking normally) AND [2] has NOT been evaluated by doctor (or NP/PA) for this  (Exception: Dizziness caused by heat exposure, sudden standing, or poor fluid intake.)  [1] Heart beating very rapidly (e.g., > 140 / minute) AND [2] not present now  (Exception: During exercise.)  Protocols used: Dizziness - Lightheadedness-A-AH, Heart Rate and Heartbeat Questions-A-AH

## 2023-08-15 NOTE — Telephone Encounter (Signed)
 Patient complaining of elevated HR 144 and palpitations. Patient stated it was not all the time and it's been going on for about a month. Patient is on atenolol  25 mg BID. Patient stated she is fine right now, but would like to have something to help with break through palpitations. Will send message to Dr. Berry Bristol and his nurse.

## 2023-08-15 NOTE — Progress Notes (Signed)
 Chief Complaint  Patient presents with   Follow-up    Follow up     Subjective: Patient is a 78 y.o. female here for fu.  Pt's mood struggling over past few weeks.  She has higher anxiety and depressed mood.  Her husband has been going thru health issues and is currently hospitalized.  She is on duloxetine  60 mg daily for fibromyalgia and depression.  She is not following with a therapist.  No homicidal or suicidal ideation.  No self-medication.  Past Medical History:  Diagnosis Date   Anxiety    Cardiac arrhythmia    CHF (congestive heart failure) (HCC)    Colon polyp    Complication of anesthesia    Concussion 11/09/2012   From a fall at home on deck   Depression    postpartum depression after 2nd child   Diabetes mellitus (HCC)    Diverticulosis    Fatty liver    GERD (gastroesophageal reflux disease)    HLD (hyperlipidemia)    Hypertension    Morbid obesity (HCC)    OSA on CPAP    uses CPAP nightly   Osteoarthritis of right knee 07/31/2013   PONV (postoperative nausea and vomiting)    Rosacea    Severe aortic stenosis    Spinal stenosis     Objective: BP 124/74 (BP Location: Left Arm, Patient Position: Sitting)   Pulse 81   Temp 98 F (36.7 C) (Oral)   Resp 16   Ht 5\' 2"  (1.575 m)   Wt 204 lb (92.5 kg)   SpO2 96%   BMI 37.31 kg/m  General: Awake, appears stated age Lungs: No accessory muscle use Psych: Age appropriate judgment and insight, normal affect and mood  Assessment and Plan: Depression, major, single episode, moderate (HCC)  Fibromyalgia - Plan: DULoxetine  (CYMBALTA ) 60 MG capsule  Bilateral hearing loss, unspecified hearing loss type - Plan: Ambulatory referral to Audiology  Chronic, not controlled.  Increase Cymbalta  from 60 mg daily to 60 mg twice daily.  Counseling resources provided.  Follow-up in 1 month to recheck. As above. Refer to audiology for their opinion, she has hearing aids but does not think they work anymore. The patient  voiced understanding and agreement to the plan.  Shellie Dials [place], DO 08/15/23  2:53 PM

## 2023-08-15 NOTE — Telephone Encounter (Signed)
 She is in A. Fib with RVR, Please refer her to AF clinic to be seen

## 2023-08-15 NOTE — Telephone Encounter (Signed)
 Patient c/o Palpitations:  STAT if patient reporting lightheadedness, shortness of breath, or chest pain  How long have you had palpitations/irregular HR/ Afib? Are you having the symptoms now? 1 month, yes  Are you currently experiencing lightheadedness, SOB or CP? lightheadedness  Do you have a history of afib (atrial fibrillation) or irregular heart rhythm? yes  Have you checked your BP or HR? (document readings if available): no  Are you experiencing any other symptoms? no

## 2023-08-16 ENCOUNTER — Telehealth: Payer: Self-pay

## 2023-08-16 ENCOUNTER — Encounter: Payer: Self-pay | Admitting: Family Medicine

## 2023-08-16 ENCOUNTER — Other Ambulatory Visit (HOSPITAL_BASED_OUTPATIENT_CLINIC_OR_DEPARTMENT_OTHER): Payer: Self-pay

## 2023-08-16 ENCOUNTER — Ambulatory Visit: Payer: Self-pay

## 2023-08-16 NOTE — Telephone Encounter (Signed)
 Called pt was advised and stated understand but would like to discuss insulin . Appt scheduled because Pt said she  Don't need to take 10 units every night, it will make her drop to low.

## 2023-08-16 NOTE — Telephone Encounter (Signed)
 done

## 2023-08-16 NOTE — Telephone Encounter (Signed)
  Chief Complaint: BS 185- pt stated no sliding scale to refer to wanting to know how much insulin  and what insulin  to take  Disposition: [] ED /[] Urgent Care (no appt availability in office) / [] Appointment(In office/virtual)/ []  [place] [facility]/ [] Home Care/ [] Refused Recommended Disposition /[] [place] [facility]/ [x]  Follow-up with PCP Additional Notes: called CAL and pt warm transferred to Jermaine Copied from CRM [id number]. Topic: Clinical - Red Word Triage >> Aug 16, 2023  1:10 PM Dorisann Garre T wrote: [facility] that prompted transfer to Nurse Triage: patient blood sugar is 185 and she doesn't know which insulin  to use at the moment because she was not told Reason for Disposition  [1] Caller requesting NON-URGENT health information AND [2] PCP's office is the best resource  Answer Assessment - Initial Assessment Questions 1. REASON FOR CALL or QUESTION: "What is your reason for calling today?" or "How can I best help you?" or "What question do you have that I can help answer?"     Pts BS 185 wanting to know sliding scale for Novolin R insulin .  Protocols used: Information Only Call - No Triage-A-AH

## 2023-08-17 ENCOUNTER — Other Ambulatory Visit (HOSPITAL_BASED_OUTPATIENT_CLINIC_OR_DEPARTMENT_OTHER): Payer: Self-pay

## 2023-08-22 ENCOUNTER — Encounter (HOSPITAL_COMMUNITY): Payer: Self-pay | Admitting: Physician Assistant

## 2023-08-22 ENCOUNTER — Other Ambulatory Visit (HOSPITAL_BASED_OUTPATIENT_CLINIC_OR_DEPARTMENT_OTHER): Payer: Self-pay

## 2023-08-22 ENCOUNTER — Ambulatory Visit (HOSPITAL_COMMUNITY)
Admission: RE | Admit: 2023-08-22 | Discharge: 2023-08-22 | Disposition: A | Discharge status: Home / Self Care | Source: Ambulatory Visit | Attending: Physician Assistant | Admitting: Physician Assistant

## 2023-08-22 VITALS — BP 124/74 | HR 76 | Ht 62.0 in | Wt 207.0 lb

## 2023-08-22 DIAGNOSIS — D6869 Other thrombophilia: Secondary | ICD-10-CM

## 2023-08-22 DIAGNOSIS — I480 Paroxysmal atrial fibrillation: Secondary | ICD-10-CM

## 2023-08-22 MED ORDER — DILTIAZEM HCL 30 MG PO TABS
30.0000 mg | ORAL_TABLET | ORAL | 1 refills | Status: DC | PRN
  Filled 2023-08-22: qty 45, 8d supply, fill #0

## 2023-08-22 NOTE — Progress Notes (Signed)
 Primary Care Physician: Jobe Mulder, DO Primary Cardiologist: Knox Perl, MD Electrophysiologist: None  Referring Physician: Dr Cresencio Dole is a 78 y.o. female with a history of DM, HLD, AS s/p TAVR, atrial fibrillation who presents for follow up in the [facility]. Patient presented to the emergency room on 12/20/2022 with palpitations and not feeling well, EMS found her to be in A-fib with RVR and received 300 mg of IV amiodarone and upon presentation to the ED, was in atypical a flutter finally converted to sinus rhythm and discharged home.  Patient is on Eliquis  for stroke prevention.   Patient presents today for follow up for atrial fibrillation. Patient reports having frequent episodes of tachypalpitations, every 2-3 weeks. They can be more frequent. The longest episodes last between 15-30 minutes and her heart rates are rapid. There are no specific triggers that she can identify. She denies alcohol use and is compliant with her CPAP.   Today, she denies symptoms of chest pain, shortness of breath, orthopnea, PND, lower extremity edema, dizziness, presyncope, syncope, bleeding, or neurologic sequela. The patient is tolerating medications without difficulties and is otherwise without complaint today.    Atrial Fibrillation Risk Factors:  she does have symptoms or diagnosis of sleep apnea. she does not have a history of rheumatic fever. she does not have a history of alcohol use. The patient does not have a history of early familial atrial fibrillation or other arrhythmias.  Atrial Fibrillation Management history:  Previous antiarrhythmic drugs: amiodarone  Previous cardioversions: none Previous ablations: none Anticoagulation history: Eliquis   ROS- All systems are reviewed and negative except as per the HPI above.  Past Medical History:  Diagnosis Date   Anxiety    Cardiac arrhythmia    CHF (congestive heart failure) (HCC)     Colon polyp    Complication of anesthesia    Concussion 11/09/2012   From a fall at home on deck   Depression    postpartum depression after 2nd child   Diabetes mellitus (HCC)    Diverticulosis    Fatty liver    GERD (gastroesophageal reflux disease)    HLD (hyperlipidemia)    Hypertension    Morbid obesity (HCC)    OSA on CPAP    uses CPAP nightly   Osteoarthritis of right knee 07/31/2013   PONV (postoperative nausea and vomiting)    Rosacea    Severe aortic stenosis    Spinal stenosis     Current Outpatient Medications  Medication Sig Dispense Refill   apixaban  (ELIQUIS ) 5 MG TABS tablet Take 1 tablet (5 mg total) by mouth 2 (two) times daily. 180 tablet 1   atenolol  (TENORMIN ) 25 MG tablet Take 1 tablet (25 mg total) by mouth 2 (two) times daily. 180 tablet 3   Blood Glucose Monitoring Suppl DEVI 1 each by Does not apply route in the morning, at noon, and at bedtime. May substitute to any manufacturer covered by patient's insurance. 1 each 0   cetirizine (ZYRTEC) 10 MG tablet Take 10 mg by mouth in the morning.     diltiazem (CARDIZEM) 30 MG tablet Take 1 tablet (30 mg total) by mouth every 4 (four) hours as needed for HR > 100 AND TOP >100. 45 tablet 1   DULoxetine  (CYMBALTA ) 60 MG capsule Take 1 capsule (60 mg total) by mouth daily. 180 capsule 1   EPINEPHrine  (EPIPEN  2-PAK) 0.3 mg/0.3 mL IJ SOAJ injection Inject 0.3 mg into  the muscle as needed for anaphylaxis. 2 each 1   insulin  glargine (LANTUS ) 100 UNIT/ML injection Inject 0.1 mLs (10 Units total) into the skin daily. 10 mL 11   insulin  regular (NOVOLIN R) 100 units/mL injection Inject into the skin daily per sliding scale. Max 40 units/day. 10 mL 11   losartan  (COZAAR ) 25 MG tablet Take 1 tablet (25 mg total) by mouth daily. 90 tablet 2   metFORMIN  (GLUCOPHAGE -XR) 500 MG 24 hr tablet Take 1 tablet (500 mg total) by mouth 2 (two) times daily with a meal. 180 tablet 3   methocarbamol  (ROBAXIN ) 500 MG tablet Take 1 tablet  (500 mg total) by mouth every 6 (six) to 8 (eight) hours as needed. 35 tablet 1   mometasone  (NASONEX ) 50 MCG/ACT nasal spray Place 2 sprays into the nose daily. 1 each 12   omeprazole  (PRILOSEC) 40 MG capsule Take 1 capsule (40 mg total) by mouth daily. 90 capsule 2   Pitavastatin  Magnesium  (ZYPITAMAG ) 4 MG TABS Take 1 tablet (4 mg total) by mouth daily. 90 tablet 2   SYSTANE COMPLETE PF 0.6 % SOLN Place 1 drop into both eyes See admin instructions. Instill 1 drop into both eyes two to three times a day     tirzepatide  (MOUNJARO ) 5 MG/0.5ML Pen Inject 5 mg into the skin once a week for 28 days. 2 mL 3   triamcinolone  cream in Minerin Creme Apply topically 2 (two) times daily as needed for itching. 454 g 0   TYLENOL  8 HOUR 650 MG CR tablet Take 1,300 mg by mouth every 8 (eight) hours as needed for pain.     No current facility-administered medications for this encounter.    Physical Exam: BP 124/74   Pulse 76   Ht 5\' 2"  (1.575 m)   Wt 93.9 kg   BMI 37.86 kg/m   GEN: Well nourished, well developed in no acute distress CARDIAC: Regular rate and rhythm, no murmurs, rubs, gallops RESPIRATORY:  Clear to auscultation without rales, wheezing or rhonchi  ABDOMEN: Soft, non-tender, non-distended EXTREMITIES:  No edema; No deformity   Wt Readings from Last 3 Encounters:  08/22/23 93.9 kg  08/15/23 92.5 kg  07/18/23 94.8 kg     EKG today demonstrates  SR, LAFB, IVCD Vent. rate 76 BPM PR interval 186 ms QRS duration 126 ms QT/QTcB 398/447 ms   Echo 03/29/23 demonstrated   1. Left ventricular ejection fraction, by estimation, is 60 to 65%. The  left ventricle has normal function. The left ventricle has no regional  wall motion abnormalities. There is moderate left ventricular hypertrophy.  Left ventricular diastolic function could not be evaluated due to mitral annular calcification (moderate or greater), but hemodynamics suggestive of grade 2 diastolic dysfunction. Elevated left  atrial pressure. The E/e' is 29.   2. Right ventricular systolic function is normal. The right ventricular  size is normal.   3. Left atrial size was mildly dilated.   4. The mitral valve is degenerative. Mild mitral valve regurgitation. No  evidence of mitral stenosis. Moderate mitral annular calcification.   5. 26 mm Edwards SAPIEN TAVR valve is well-seated, no evidence of  dehiscence, no aortic valvular regurgitation, no obvious valvular  stenosis, no perivalvular leak.   Comparison(s): A prior study was performed on 02/22/2019. No significant  change from prior study.     CHA2DS2-VASc Score = 4  The patient's score is based upon: CHF History: 0 HTN History: 0 Diabetes History: 1 Stroke History: 0 Vascular  Disease History: 1 Age Score: 1 Gender Score: 1       ASSESSMENT AND PLAN: Paroxysmal Atrial Fibrillation (ICD10:  I48.0) The patient's CHA2DS2-VASc score is 4, indicating a 4.8% annual risk of stroke.   Patient in SR today but having brief, frequent symptomatic afib episodes.  We discussed rhythm control options including AAD (Multaq, dofetilide, amiodarone) and ablation. Would avoid class IC with moderate LVH. Patient would prefer to avoid more long term medications and would like to be evaluated for ablation. Her husband is a patient of Dr Daneil Dunker, will refer.  Continue atenolol  25 mg BID Continue Eliquis  5 mg BID Start diltiazem 30 mg PRN q 4 hours for heart racing  Secondary Hypercoagulable State (ICD10:  D68.69) The patient is at significant risk for stroke/thromboembolism based upon her CHA2DS2-VASc Score of 4.  Continue Apixaban  (Eliquis ). No bleeding issues.  OSA  Encouraged nightly CPAP The importance of adequate treatment of sleep apnea was discussed today in order to improve our ability to maintain sinus rhythm long term.  VHD AS s/p TAVR Followed by Dr Berry Bristol  Obesity Body mass index is 37.86 kg/m.  Encouraged lifestyle modification On  Mounjaro     Follow up with Dr Daneil Dunker to establish care and discuss ablation.       Myrtha Ates PA-C [facility] [address] [place], [place] [address] [phone]

## 2023-08-29 DIAGNOSIS — E119 Type 2 diabetes mellitus without complications: Secondary | ICD-10-CM | POA: Diagnosis not present

## 2023-08-29 DIAGNOSIS — H35372 Puckering of macula, left eye: Secondary | ICD-10-CM | POA: Diagnosis not present

## 2023-08-29 LAB — HM DIABETES EYE EXAM

## 2023-08-30 ENCOUNTER — Other Ambulatory Visit: Payer: Self-pay | Admitting: Family Medicine

## 2023-08-30 DIAGNOSIS — F411 Generalized anxiety disorder: Secondary | ICD-10-CM

## 2023-09-01 ENCOUNTER — Encounter: Payer: Self-pay | Admitting: Family Medicine

## 2023-09-01 DIAGNOSIS — G4733 Obstructive sleep apnea (adult) (pediatric): Secondary | ICD-10-CM | POA: Diagnosis not present

## 2023-09-02 ENCOUNTER — Encounter: Payer: Self-pay | Admitting: Family Medicine

## 2023-09-02 ENCOUNTER — Ambulatory Visit (INDEPENDENT_AMBULATORY_CARE_PROVIDER_SITE_OTHER): Admitting: Family Medicine

## 2023-09-02 VITALS — BP 130/78 | HR 76 | Temp 97.7°F | Resp 18 | Ht 62.0 in | Wt 203.4 lb

## 2023-09-02 DIAGNOSIS — Z7985 Long-term (current) use of injectable non-insulin antidiabetic drugs: Secondary | ICD-10-CM

## 2023-09-02 DIAGNOSIS — E1169 Type 2 diabetes mellitus with other specified complication: Secondary | ICD-10-CM

## 2023-09-02 DIAGNOSIS — Z7984 Long term (current) use of oral hypoglycemic drugs: Secondary | ICD-10-CM

## 2023-09-02 DIAGNOSIS — E669 Obesity, unspecified: Secondary | ICD-10-CM

## 2023-09-02 NOTE — Progress Notes (Signed)
 Chief Complaint  Patient presents with   Medication Refill    Subjective: Patient is a 78 y.o. female here for f/u.  Patient is here to discuss her diabetes.  She stopped taking her Lantus  and is not taking any sliding scale insulin .  She was asking for some help with that.  Sugars have been running in the low 100s in the morning.  Diet is okay.  She has not been exercising routinely.  No chest pain or shortness of breath.  No lows.  Compliant with metformin  XR 500 mg twice daily and Mounjaro  5 mg weekly.  Past Medical History:  Diagnosis Date   Anxiety    Cardiac arrhythmia    CHF (congestive heart failure) (HCC)    Colon polyp    Complication of anesthesia    Concussion 11/09/2012   From a fall at home on deck   Depression    postpartum depression after 2nd child   Diabetes mellitus (HCC)    Diverticulosis    Fatty liver    GERD (gastroesophageal reflux disease)    HLD (hyperlipidemia)    Hypertension    Morbid obesity (HCC)    OSA on CPAP    uses CPAP nightly   Osteoarthritis of right knee 07/31/2013   PONV (postoperative nausea and vomiting)    Rosacea    Severe aortic stenosis    Spinal stenosis     Objective: BP 130/78 (BP Location: Left Arm, Cuff Size: Normal)   Pulse 76   Temp 97.7 F (36.5 C)   Resp 18   Ht 5\' 2"  (1.575 m)   Wt 203 lb 6.4 oz (92.3 kg)   SpO2 96%   BMI 37.20 kg/m  General: Awake, appears stated age Heart: RRR, no LE edema Lungs: CTAB, no rales, wheezes or rhonchi. No accessory muscle use Psych: Age appropriate judgment and insight, normal affect and mood  Assessment and Plan: Type 2 diabetes mellitus with obesity (HCC)  Chronic, probably stable.  Continue Mounjaro  5 mg weekly, metformin  XR 500 mg twice daily.  Sliding scale Novolin as below.  Can probably stop Lantus .  Hopefully will not need a sliding scale much longer either.  She is hesitant to go to higher dosages of Mounjaro  at this time. Sliding scale:  170-189: 1 unit 190-209:  2 units 210-229: 3 units 230-249: 4 units 250-269: 5 units 270-289: 6 units 290 and above: 7 units and let us  know. The patient voiced understanding and agreement to the plan.  Shellie Dials [place], DO 09/02/23  4:24 PM

## 2023-09-02 NOTE — Patient Instructions (Addendum)
 If sugars between:  170-189: 1 unit 190-209: 2 units 210-229: 3 units 230-249: 4 units 250-269: 5 units 270-289: 6 units 290 and above: 7 units and let us  know.  Keep the diet clean and stay active.  Let us  know if you need anything.

## 2023-09-05 ENCOUNTER — Encounter: Payer: Self-pay | Admitting: Pharmacist

## 2023-09-07 ENCOUNTER — Ambulatory Visit: Admitting: Gastroenterology

## 2023-09-16 ENCOUNTER — Encounter: Payer: Self-pay | Admitting: Cardiology

## 2023-09-16 ENCOUNTER — Encounter: Payer: Self-pay | Admitting: Family Medicine

## 2023-09-16 ENCOUNTER — Ambulatory Visit: Attending: Cardiology | Admitting: Cardiology

## 2023-09-16 ENCOUNTER — Other Ambulatory Visit (HOSPITAL_BASED_OUTPATIENT_CLINIC_OR_DEPARTMENT_OTHER): Payer: Self-pay

## 2023-09-16 ENCOUNTER — Other Ambulatory Visit: Payer: Self-pay

## 2023-09-16 VITALS — BP 112/69 | HR 72 | Ht 62.0 in | Wt 196.0 lb

## 2023-09-16 DIAGNOSIS — I10 Essential (primary) hypertension: Secondary | ICD-10-CM

## 2023-09-16 DIAGNOSIS — Z01812 Encounter for preprocedural laboratory examination: Secondary | ICD-10-CM

## 2023-09-16 DIAGNOSIS — G4733 Obstructive sleep apnea (adult) (pediatric): Secondary | ICD-10-CM

## 2023-09-16 DIAGNOSIS — I471 Supraventricular tachycardia, unspecified: Secondary | ICD-10-CM

## 2023-09-16 DIAGNOSIS — I480 Paroxysmal atrial fibrillation: Secondary | ICD-10-CM

## 2023-09-16 DIAGNOSIS — D6869 Other thrombophilia: Secondary | ICD-10-CM

## 2023-09-16 DIAGNOSIS — Z952 Presence of prosthetic heart valve: Secondary | ICD-10-CM

## 2023-09-16 MED ORDER — TIRZEPATIDE 5 MG/0.5ML ~~LOC~~ SOAJ
5.0000 mg | SUBCUTANEOUS | 3 refills | Status: DC
  Filled 2023-09-16: qty 2, 28d supply, fill #0

## 2023-09-16 MED ORDER — TIRZEPATIDE 5 MG/0.5ML ~~LOC~~ SOAJ: 5.0000 mg | SUBCUTANEOUS | 3 refills | Status: DC

## 2023-09-16 NOTE — Progress Notes (Signed)
 Electrophysiology Office Note:   Date:  09/16/2023  ID:  Kara Lee, DOB [birth year], MRN [id number]  Primary Cardiologist: Knox Perl, MD Electrophysiologist: Ardeen Kohler, MD      History of Present Illness:   Kara Lee is a 78 y.o. female with h/o DM, HLD, AS s/p TAVR, atrial fibrillation who is being seen today for evaluation of her atrial fibrillation.   Discussed the use of AI scribe software for clinical note transcription with the patient, who gave verbal consent to proceed.  History of Present Illness She has a long-standing history of atrial fibrillation, first diagnosed approximately 25 years ago. Episodes occur sporadically without a clear trigger and have increased in frequency over the past six months. She reports episodes of increased heart rate, confirmed by her watch, but does not have a device that specifically detects atrial fibrillation.Episodes typically last 10-15 minutes, but in the past, she has required medical intervention, including medication to restore normal rhythm. In September 2024, she experienced an episode where her heart rate reached 120-150 bpm, necessitating EMS transport to the ER. She was treated with amiodarone, which successfully restored her normal rhythm. She is not keen on long-term medication, as she has concerns about the side effects of medications, particularly fatigue, and is interested in options that would minimize her medication burden. Otherwise doing relatively well. No new or acute complaints.   Review of systems complete and found to be negative unless listed in HPI.   EP Information / Studies Reviewed:    EKG is ordered today. Personal review as below.     EKG 12/2022: AF     Echo 03/2023:   1. Left ventricular ejection fraction, by estimation, is 60 to 65%. The  left ventricle has normal function. The left ventricle has no regional  wall motion abnormalities. There is moderate left ventricular hypertrophy.  Left  ventricular diastolic function   could not be evaluated due to mitral annular calcification (moderate or  greater), but hemodynamics suggestive of grade 2 diastolic dysfunction.  Elevated left atrial pressure. The E/e' is 29.   2. Right ventricular systolic function is normal. The right ventricular  size is normal.   3. Left atrial size was mildly dilated.   4. The mitral valve is degenerative. Mild mitral valve regurgitation. No  evidence of mitral stenosis. Moderate mitral annular calcification.   5. 26 mm Edwards SAPIEN TAVR valve is well-seated, no evidence of  dehiscence, no aortic valvular regurgitation, no obvious valvular  stenosis, no perivalvular leak.   Risk Assessment/Calculations:    CHA2DS2-VASc Score = 4   This indicates a 4.8% annual risk of stroke. The patient's score is based upon: CHF History: 0 HTN History: 0 Diabetes History: 1 Stroke History: 0 Vascular Disease History: 1 Age Score: 1 Gender Score: 1             Physical Exam:   VS:  BP 112/69   Pulse 72   Ht 5\' 2"  (1.575 m)   Wt 196 lb (88.9 kg)   SpO2 98%   BMI 35.85 kg/m    Wt Readings from Last 3 Encounters:  09/16/23 196 lb (88.9 kg)  09/02/23 203 lb 6.4 oz (92.3 kg)  08/22/23 207 lb (93.9 kg)     GEN: Well nourished, well developed in no acute distress NECK: No JVD CARDIAC: Normal rate, regular rhythm RESPIRATORY:  Clear to auscultation without rales, wheezing or rhonchi  ABDOMEN: Soft, non-distended EXTREMITIES:  No edema; No deformity   ASSESSMENT  AND PLAN:    #Paroxysmal atrial fibrillation, symptomatic:  #Secondary hypercoagulable state due to AF: CHADSVASC score of 4.  -Discussed treatment options today for AF including antiarrhythmic drug therapy and ablation. Discussed risks, recovery and likelihood of success with each treatment strategy. Risk, benefits, and alternatives to EP study and ablation for afib were discussed. These risks include but are not limited to stroke,  bleeding, vascular damage, tamponade, perforation, damage to the esophagus, lungs, phrenic nerve and other structures, pulmonary vein stenosis, worsening renal function, coronary vasospasm and death.  Discussed potential need for repeat ablation procedures and antiarrhythmic drugs after an initial ablation. The patient understands these risk and wishes to proceed.  We will therefore proceed with catheter ablation at the next available time.  Carto, ICE, anesthesia are requested for the procedure.  Will also obtain CT PV protocol prior to the procedure to exclude LAA thrombus and further evaluate atrial anatomy. - Given the short nature of her episodes, we will also perform complete EP study at time of ablation to evaluate for any SVT.  -Continue atenolol  25mg  BID. Diltiazem  30mg  every 4 hours as needed for sustained heart rates above 100bpm.  -Continue Eliquis  5mg  BID.   #OSA - CPAP use  #AS s/p TAVR: Appears well compensated.  - Continue follow up with general cardiology.  - Prophylactic antibiotics at time of ablation.   #Hypertension - At goal today.  Recommend checking blood pressures 1-2 times per week at home and recording the values.  Recommend bringing these recordings to the primary care physician.  Follow up with Dr. Daneil Dunker 3 months after ablation.    Signed, Ardeen Kohler, MD

## 2023-09-16 NOTE — Addendum Note (Signed)
 Addended by: Alvenia Aus on: 09/16/2023 06:21 PM   Modules accepted: Orders

## 2023-09-16 NOTE — Telephone Encounter (Signed)
 Called spoke with pt and advised message sent about bed for fred and refill sent.

## 2023-09-16 NOTE — Patient Instructions (Signed)
 Medication Instructions:  Your physician recommends that you continue on your current medications as directed. Please refer to the Current Medication list given to you today.  *If you need a refill on your cardiac medications before your next appointment, please call your pharmacy*   Lab Work: Pre procedure labs -- we will call you to schedule:  BMP & CBC  If you have a lab test that is abnormal and we need to change your treatment, we will call you to review the results -- otherwise no news is good news.    Testing/Procedures: Your physician has requested that you have cardiac CT 1 month PRIOR to your ablation. Cardiac computed tomography (CT) is a painless test that uses an x-ray machine to take clear, detailed pictures of your heart. We will contact you if the result is abnormal. We will call you to schedule.  Your physician has recommended that you have an ablation. Catheter ablation is a medical procedure used to treat some cardiac arrhythmias (irregular heartbeats). During catheter ablation, a long, thin, flexible tube is put into a blood vessel in your groin (upper thigh), or neck. This tube is called an ablation catheter. It is then guided to your heart through the blood vessel. Radio frequency waves destroy small areas of heart tissue where abnormal heartbeats may cause an arrhythmia to start.   Your ablation is scheduled for 11/16/2023. Please arrive at [facility] at 11:30 am.  We will call/send instructions at a later date.   Follow-Up: At [facility], you and your health needs are our priority.  As part of our continuing mission to provide you with exceptional heart care, we have created designated Provider Care Teams.  These Care Teams include your primary Cardiologist (physician) and Advanced Practice Providers (APPs -  Physician Assistants and Nurse Practitioners) who all work together to provide you with the care you need, when you need it.  Your next appointment:    1 month(s) after your ablation  The format for your next appointment:   In Person  Provider:   AFib clinic   Thank you for choosing [facility]!!   Reece Cane, RN [phone]    Other Instructions   Cardiac Ablation Cardiac ablation is a procedure to destroy (ablate) some heart tissue that is sending bad signals. These bad signals cause problems in heart rhythm. The heart has many areas that make these signals. If there are problems in these areas, they can make the heart beat in a way that is not normal. Destroying some tissues can help make the heart rhythm normal. Tell your doctor about: Any allergies you have. All medicines you are taking. These include vitamins, herbs, eye drops, creams, and over-the-counter medicines. Any problems you or family members have had with medicines that make you fall asleep (anesthetics). Any blood disorders you have. Any surgeries you have had. Any medical conditions you have, such as kidney failure. Whether you are pregnant or may be pregnant. What are the risks? This is a safe procedure. But problems may occur, including: Infection. Bruising and bleeding. Bleeding into the chest. Stroke or blood clots. Damage to nearby areas of your body. Allergies to medicines or dyes. The need for a pacemaker if the normal system is damaged. Failure of the procedure to treat the problem. What happens before the procedure? Medicines Ask your doctor about: Changing or stopping your normal medicines. This is important. Taking aspirin  and ibuprofen. Do not take these medicines unless your doctor tells you  to take them. Taking other medicines, vitamins, herbs, and supplements. General instructions Follow instructions from your doctor about what you cannot eat or drink. Plan to have someone take you home from the hospital or clinic. If you will be going home right after the procedure, plan to have someone with you for 24 hours. Ask your  doctor what steps will be taken to prevent infection. What happens during the procedure?  An IV tube will be put into one of your veins. You will be given a medicine to help you relax. The skin on your neck or groin will be numbed. A cut (incision) will be made in your neck or groin. A needle will be put through your cut and into a large vein. A tube (catheter) will be put into the needle. The tube will be moved to your heart. Dye may be put through the tube. This helps your doctor see your heart. Small devices (electrodes) on the tube will send out signals. A type of energy will be used to destroy some heart tissue. The tube will be taken out. Pressure will be held on your cut. This helps stop bleeding. A bandage will be put over your cut. The exact procedure may vary among doctors and hospitals. What happens after the procedure? You will be watched until you leave the hospital or clinic. This includes checking your heart rate, breathing rate, oxygen, and blood pressure. Your cut will be watched for bleeding. You will need to lie still for a few hours. Do not drive for 24 hours or as long as your doctor tells you. Summary Cardiac ablation is a procedure to destroy some heart tissue. This is done to treat heart rhythm problems. Tell your doctor about any medical conditions you may have. Tell him or her about all medicines you are taking to treat them. This is a safe procedure. But problems may occur. These include infection, bruising, bleeding, and damage to nearby areas of your body. Follow what your doctor tells you about food and drink. You may also be told to change or stop some of your medicines. After the procedure, do not drive for 24 hours or as long as your doctor tells you. This information is not intended to replace advice given to you by your health care provider. Make sure you discuss any questions you have with your health care provider. Document Revised: 06/26/2021 Document  Reviewed: 03/08/2019 Elsevier Patient Education  2023 Elsevier Inc.   Cardiac Ablation, Care After  This sheet gives you information about how to care for yourself after your procedure. Your health care provider may also give you more specific instructions. If you have problems or questions, contact your health care provider. What can I expect after the procedure? After the procedure, it is common to have: Bruising around your puncture site. Tenderness around your puncture site. Skipped heartbeats. If you had an atrial fibrillation ablation, you may have atrial fibrillation during the first several months after your procedure.  Tiredness (fatigue).  Follow these instructions at home: Puncture site care  Follow instructions from your health care provider about how to take care of your puncture site. Make sure you: If present, leave stitches (sutures), skin glue, or adhesive strips in place. These skin closures may need to stay in place for up to 2 weeks. If adhesive strip edges start to loosen and curl up, you may trim the loose edges. Do not remove adhesive strips completely unless your health care provider tells you to do  that. If a large square bandage is present, this may be removed 24 hours after surgery.  Check your puncture site every day for signs of infection. Check for: Redness, swelling, or pain. Fluid or blood. If your puncture site starts to bleed, lie down on your back, apply firm pressure to the area, and contact your health care provider. Warmth. Pus or a bad smell. A pea or small marble sized lump at the site is normal and can take up to three months to resolve.  Driving Do not drive for at least 4 days after your procedure or however long your health care provider recommends. (Do not resume driving if you have previously been instructed not to drive for other health reasons.) Do not drive or use heavy machinery while taking prescription pain medicine. Activity Avoid  activities that take a lot of effort for at least 7 days after your procedure. Do not lift anything that is heavier than 5 lb (4.5 kg) for one week.  No sexual activity for 1 week.  Return to your normal activities as told by your health care provider. Ask your health care provider what activities are safe for you. General instructions Take over-the-counter and prescription medicines only as told by your health care provider. Do not use any products that contain nicotine or tobacco, such as cigarettes and e-cigarettes. If you need help quitting, ask your health care provider. You may shower after 24 hours, but Do not take baths, swim, or use a hot tub for 1 week.  Do not drink alcohol for 24 hours after your procedure. Keep all follow-up visits as told by your health care provider. This is important. Contact a health care provider if: You have redness, mild swelling, or pain around your puncture site. You have fluid or blood coming from your puncture site that stops after applying firm pressure to the area. Your puncture site feels warm to the touch. You have pus or a bad smell coming from your puncture site. You have a fever. You have chest pain or discomfort that spreads to your neck, jaw, or arm. You have chest pain that is worse with lying on your back or taking a deep breath. You are sweating a lot. You feel nauseous. You have a fast or irregular heartbeat. You have shortness of breath. You are dizzy or light-headed and feel the need to lie down. You have pain or numbness in the arm or leg closest to your puncture site. Get help right away if: Your puncture site suddenly swells. Your puncture site is bleeding and the bleeding does not stop after applying firm pressure to the area. These symptoms may represent a serious problem that is an emergency. Do not wait to see if the symptoms will go away. Get medical help right away. Call your local emergency services (911 in the [place] Do not  drive yourself to the hospital. Summary After the procedure, it is normal to have bruising and tenderness at the puncture site in your groin, neck, or forearm. Check your puncture site every day for signs of infection. Get help right away if your puncture site is bleeding and the bleeding does not stop after applying firm pressure to the area. This is a medical emergency. This information is not intended to replace advice given to you by your health care provider. Make sure you discuss any questions you have with your health care provider.

## 2023-09-19 ENCOUNTER — Ambulatory Visit: Attending: Family Medicine | Admitting: Audiologist

## 2023-09-19 DIAGNOSIS — H903 Sensorineural hearing loss, bilateral: Secondary | ICD-10-CM | POA: Insufficient documentation

## 2023-09-19 NOTE — Procedures (Signed)
  Outpatient Audiology and [facility] [address] [place], [place]  [address] [phone]  AUDIOLOGICAL  EVALUATION  NAME: Kara Lee     DOB:   September 22, 1945      MRN: [id number]                                                                                     DATE: 09/19/2023     REFERENT: Jobe Mulder, DO STATUS: Outpatient DIAGNOSIS: Sensorineural Hearing Loss Bilateral   History: Ravyn was seen for an audiological evaluation due to difficulty hearing people clearly. She and her son are yelling at each other. She cannot hear people from distance. Citlali has intermittent pain, pressure, and tinnitus. She had a hearing test years ago showing hearing loss. She is not sure if hearing aids were recommended.  Raksha has no significant history of hazardous noise exposure.  Medical history shows diabetes which is risk for hearing loss.    Evaluation:  Otoscopy showed a clear view of the tympanic membranes, bilaterally Tympanometry results were consistent with normal middle ear function, bilaterally   Audiometric testing was completed using Conventional Audiometry techniques with insert earphones and supraural headphones. Test results are consistent with moderate sensorineural hearing loss bilaterally. Speech Recognition Thresholds were obtained at  40dB HL in the right ear and at 35dB HL in the left ear. Word Recognition Testing was completed at  40dB SL and Taren scored 100% in each ear.    Results:  The test results were reviewed with Miara. She has a moderate sensorineural hearing loss in both ears. She needs hearing aids. She is open to a demo, list of local providers given to patient.  Audiogram printed and provided to Tykeria.    Recommendations: Hearing aids recommended for both ears. Patient given list of local hearing aid providers.  Annual audiometric testing recommended to monitor hearing loss for progression.     30 minutes spent testing and  counseling on results.   If you have any questions please feel free to contact me at [phone].  Raynald Calkins Stalnaker Au.D.  Audiologist   09/19/2023  2:49 PM  Cc: Jobe Mulder, DO

## 2023-09-20 ENCOUNTER — Other Ambulatory Visit (HOSPITAL_BASED_OUTPATIENT_CLINIC_OR_DEPARTMENT_OTHER): Payer: Self-pay

## 2023-09-21 ENCOUNTER — Ambulatory Visit (INDEPENDENT_AMBULATORY_CARE_PROVIDER_SITE_OTHER): Admitting: Family Medicine

## 2023-09-21 ENCOUNTER — Encounter: Payer: Self-pay | Admitting: Family Medicine

## 2023-09-21 VITALS — BP 128/80 | HR 83 | Temp 98.0°F | Resp 16 | Ht 62.0 in | Wt 198.5 lb

## 2023-09-21 DIAGNOSIS — S46812A Strain of other muscles, fascia and tendons at shoulder and upper arm level, left arm, initial encounter: Secondary | ICD-10-CM | POA: Diagnosis not present

## 2023-09-21 DIAGNOSIS — Z1211 Encounter for screening for malignant neoplasm of colon: Secondary | ICD-10-CM

## 2023-09-21 MED ORDER — METHYLPREDNISOLONE ACETATE 80 MG/ML IJ SUSP
80.0000 mg | Freq: Once | INTRAMUSCULAR | Status: AC
  Administered 2023-09-21: 80 mg via INTRAMUSCULAR

## 2023-09-21 NOTE — Addendum Note (Signed)
 Addended by: Zaim Nitta M on: 09/21/2023 04:23 PM   Modules accepted: Orders

## 2023-09-21 NOTE — Addendum Note (Signed)
 Addended by: Tyri Elmore M on: 09/21/2023 04:21 PM   Modules accepted: Orders

## 2023-09-21 NOTE — Progress Notes (Signed)
 Musculoskeletal Exam  Patient: Kara Lee DOB: 10/04/1945  DOS: 09/21/2023  SUBJECTIVE:  Chief Complaint:   Chief Complaint  Patient presents with   Shoulder Pain    Left Shoulder     Kara Lee is a 78 y.o.  female for evaluation and treatment of L shoulder pain.   Onset:  2 weeks ago. Jerked her shoulder when she was picking things up.  Location: points to trap Character:  aching  Progression of issue:  not improving Associated symptoms: hurts to push on No bruising, redness, swelling, decreased ROM.  Treatment: to date has been acetaminophen .   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 2nd child   Diabetes mellitus (HCC)    Diverticulosis    Fatty liver    GERD (gastroesophageal reflux disease)    HLD (hyperlipidemia)    Hypertension    Morbid obesity (HCC)    OSA on CPAP    uses CPAP nightly   Osteoarthritis of right knee 07/31/2013   PONV (postoperative nausea and vomiting)    Rosacea    Severe aortic stenosis    Spinal stenosis     Objective: VITAL SIGNS: BP 128/80 (BP Location: Left Arm, Patient Position: Sitting)   Pulse 83   Temp 98 F (36.7 C) (Oral)   Resp 16   Ht 5\' 2"  (1.575 m)   Wt 198 lb 8 oz (90 kg)   SpO2 100%   BMI 36.31 kg/m  Constitutional: Well formed, well developed. No acute distress. Thorax & Lungs: No accessory muscle use Musculoskeletal: L shoulder.   Normal active range of motion: yes.   Normal passive range of motion: yes Tenderness to palpation: yes over L trap Deformity: no Ecchymosis: no Tests positive: none Tests negative: Neer's, Cross over, lift off, empty can, [place] Neurologic: Normal sensory function. Psychiatric: Normal mood. Age appropriate judgment and insight.  Alert & oriented x 3.    Assessment:  Strain of left trapezius muscle, initial encounter  Plan: Stretches/exercises, heat, ice, Tylenol . Depomedrol injection today. Consider trigger pt injections in future vs PT if no better.  F/u as originally scheduled. The patient voiced understanding and agreement to the plan.   Shellie Dials [place], DO 09/21/23  4:10 PM

## 2023-09-21 NOTE — Patient Instructions (Addendum)
Heat (pad or rice pillow in microwave) over affected area, 10-15 minutes twice daily.   Ice/cold pack over area for 10-15 min twice daily.  OK to take Tylenol 1000 mg (2 extra strength tabs) or 975 mg (3 regular strength tabs) every 6 hours as needed.  Let us know if you need anything.  Trapezius stretches/exercises Do exercises exactly as told by your health care provider and adjust them as directed. It is normal to feel mild stretching, pulling, tightness, or discomfort as you do these exercises, but you should stop right away if you feel sudden pain or your pain gets worse.   Stretching and range of motion exercises These exercises warm up your muscles and joints and improve the movement and flexibility of your shoulder. These exercises can also help to relieve pain, numbness, and tingling. If you are unable to do any of the following for any reason, do not further attempt to do it.   Exercise A: Flexion, standing     Stand and hold a broomstick, a cane, or a similar object. Place your hands a little more than shoulder-width apart on the object. Your left / right hand should be palm-up, and your other hand should be palm-down. Push the stick to raise your left / right arm out to your side and then over your head. Use your other hand to help move the stick. Stop when you feel a stretch in your shoulder, or when you reach the angle that is recommended by your health care provider. Avoid shrugging your shoulder while you raise your arm. Keep your shoulder blade tucked down toward your spine. Hold for 30 seconds. Slowly return to the starting position. Repeat 2 times. Complete this exercise 3 times per week.  Exercise B: Abduction, supine     Lie on your back and hold a broomstick, a cane, or a similar object. Place your hands a little more than shoulder-width apart on the object. Your left / right hand should be palm-up, and your other hand should be palm-down. Push the stick to raise  your left / right arm out to your side and then over your head. Use your other hand to help move the stick. Stop when you feel a stretch in your shoulder, or when you reach the angle that is recommended by your health care provider. Avoid shrugging your shoulder while you raise your arm. Keep your shoulder blade tucked down toward your spine. Hold for 30 seconds. Slowly return to the starting position. Repeat 2 times. Complete this exercise 3 times per week.  Exercise C: Flexion, active-assisted     Lie on your back. You may bend your knees for comfort. Hold a broomstick, a cane, or a similar object. Place your hands about shoulder-width apart on the object. Your palms should face toward your feet. Raise the stick and move your arms over your head and behind your head, toward the floor. Use your healthy arm to help your left / right arm move farther. Stop when you feel a gentle stretch in your shoulder, or when you reach the angle where your health care provider tells you to stop. Hold for 30 seconds. Slowly return to the starting position. Repeat 2 times. Complete this exercise 3 times per week.  Exercise D: External rotation and abduction     Stand in a door frame with one of your feet slightly in front of the other. This is called a staggered stance. Choose one of the following positions as told   by your health care provider: Place your hands and forearms on the door frame above your head. Place your hands and forearms on the door frame at the height of your head. Place your hands on the door frame at the height of your elbows. Slowly move your weight onto your front foot until you feel a stretch across your chest and in the front of your shoulders. Keep your head and chest upright and keep your abdominal muscles tight. Hold for 30 seconds. To release the stretch, shift your weight to your back foot. Repeat 2 times. Complete this stretch 3 times per week.  Strengthening  exercises These exercises build strength and endurance in your shoulder. Endurance is the ability to use your muscles for a long time, even after your muscles get tired. Exercise E: Scapular depression and adduction  Sit on a stable chair. Support your arms in front of you with pillows, armrests, or a tabletop. Keep your elbows in line with the sides of your body. Gently move your shoulder blades down toward your middle back. Relax the muscles on the tops of your shoulders and in the back of your neck. Hold for 3 seconds. Slowly release the tension and relax your muscles completely before doing this exercise again. Repeat for a total of 10 repetitions. After you have practiced this exercise, try doing the exercise without the arm support. Then, try the exercise while standing instead of sitting. Repeat 2 times. Complete this exercise 3 times per week.  Exercise F: Shoulder abduction, isometric     Stand or sit about 4-6 inches (10-15 cm) from a wall with your left / right side facing the wall. Bend your left / right elbow and gently press your elbow against the wall. Increase the pressure slowly until you are pressing as hard as you can without shrugging your shoulder. Hold for 3 seconds. Slowly release the tension and relax your muscles completely. Repeat for a total of 10 repetitions. Repeat 2 times. Complete this exercise 3 times per week.  Exercise G: Shoulder flexion, isometric     Stand or sit about 4-6 inches (10-15 cm) away from a wall with your left / right side facing the wall. Keep your left / right elbow straight and gently press the top of your fist against the wall. Increase the pressure slowly until you are pressing as hard as you can without shrugging your shoulder. Hold for 10-15 seconds. Slowly release the tension and relax your muscles completely. Repeat for a total of 10 repetitions. Repeat 2 times. Complete this exercise 3 times per week.  Exercise H: Internal  rotation     Sit in a stable chair without armrests, or stand. Secure an exercise band at your left / right side, at elbow height. Place a soft object, such as a folded towel or a small pillow, under your left / right upper arm so your elbow is a few inches (about 8 cm) away from your side. Hold the end of the exercise band so the band stretches. Keeping your elbow pressed against the soft object under your arm, move your forearm across your body toward your abdomen. Keep your body steady so the movement is only coming from your shoulder. Hold for 3 seconds. Slowly return to the starting position. Repeat for a total of 10 repetitions. Repeat 2 times. Complete this exercise 3 times per week.  Exercise I: External rotation     Sit in a stable chair without armrests, or stand. Secure an   exercise band at your left / right side, at elbow height. Place a soft object, such as a folded towel or a small pillow, under your left / right upper arm so your elbow is a few inches (about 8 cm) away from your side. Hold the end of the exercise band so the band stretches. Keeping your elbow pressed against the soft object under your arm, move your forearm out, away from your abdomen. Keep your body steady so the movement is only coming from your shoulder. Hold for 3 seconds. Slowly return to the starting position. Repeat for a total of 10 repetitions. Repeat 2 times. Complete this exercise 3 times per week. Exercise J: Shoulder extension  Sit in a stable chair without armrests, or stand. Secure an exercise band to a stable object in front of you so the band is at shoulder height. Hold one end of the exercise band in each hand. Your palms should face each other. Straighten your elbows and lift your hands up to shoulder height. Step back, away from the secured end of the exercise band, until the band stretches. Squeeze your shoulder blades together and pull your hands down to the sides of your thighs. Stop  when your hands are straight down by your sides. Do not let your hands go behind your body. Hold for 3 seconds. Slowly return to the starting position. Repeat for a total of 10 repetitions. Repeat 2 times. Complete this exercise 3 times per week.  Exercise K: Shoulder extension, prone     Lie on your abdomen on a firm surface so your left / right arm hangs over the edge. Hold a 5 lb weight in your hand so your palm faces in toward your body. Your arm should be straight. Squeeze your shoulder blade down toward the middle of your back. Slowly raise your arm behind you, up to the height of the surface that you are lying on. Keep your arm straight. Hold for 3 seconds. Slowly return to the starting position and relax your muscles. Repeat for a total of 10 repetitions. Repeat 2 times. Complete this exercise 3 times per week.   Exercise L: Horizontal abduction, prone  Lie on your abdomen on a firm surface so your left / right arm hangs over the edge. Hold a 5 lb weight in your hand so your palm faces toward your feet. Your arm should be straight. Squeeze your shoulder blade down toward the middle of your back. Bend your elbow so your hand moves up, until your elbow is bent to an "L" shape (90 degrees). With your elbow bent, slowly move your forearm forward and up. Raise your hand up to the height of the surface that you are lying on. Your upper arm should not move, and your elbow should stay bent. At the top of the movement, your palm should face the floor. Hold for 3 seconds. Slowly return to the starting position and relax your muscles. Repeat for a total of 10 repetitions. Repeat 2 times. Complete this exercise 3 times per week.  Exercise M: Horizontal abduction, standing  Sit on a stable chair, or stand. Secure an exercise band to a stable object in front of you so the band is at shoulder height. Hold one end of the exercise band in each hand. Straighten your elbows and lift your hands  straight in front of you, up to shoulder height. Your palms should face down, toward the floor. Step back, away from the secured end of the   exercise band, until the band stretches. Move your arms out to your sides, and keep your arms straight. Hold for 3 seconds. Slowly return to the starting position. Repeat for a total of 10 repetitions. Repeat 2 times. Complete this exercise 3 times per week.  Exercise N: Scapular retraction and elevation  Sit on a stable chair, or stand. Secure an exercise band to a stable object in front of you so the band is at shoulder height. Hold one end of the exercise band in each hand. Your palms should face each other. Sit in a stable chair without armrests, or stand. Step back, away from the secured end of the exercise band, until the band stretches. Squeeze your shoulder blades together and lift your hands over your head. Keep your elbows straight. Hold for 3 seconds. Slowly return to the starting position. Repeat for a total of 10 repetitions. Repeat 2 times. Complete this exercise 3 times per week.  This information is not intended to replace advice given to you by your health care provider. Make sure you discuss any questions you have with your health care provider. Document Released: 04/05/2005 Document Revised: 12/11/2015 Document Reviewed: 02/20/2015 Elsevier Interactive Patient Education  2017 [employer]  

## 2023-09-22 ENCOUNTER — Encounter: Payer: Self-pay | Admitting: Family Medicine

## 2023-09-22 ENCOUNTER — Encounter: Payer: Self-pay | Admitting: Cardiology

## 2023-09-26 DIAGNOSIS — Z1211 Encounter for screening for malignant neoplasm of colon: Secondary | ICD-10-CM | POA: Diagnosis not present

## 2023-09-27 ENCOUNTER — Ambulatory Visit: Admitting: Physician Assistant

## 2023-09-29 ENCOUNTER — Encounter: Payer: Self-pay | Admitting: Family Medicine

## 2023-09-29 DIAGNOSIS — K080 Exfoliation of teeth due to systemic causes: Secondary | ICD-10-CM | POA: Diagnosis not present

## 2023-09-30 ENCOUNTER — Ambulatory Visit: Admitting: Family Medicine

## 2023-10-02 DIAGNOSIS — G4733 Obstructive sleep apnea (adult) (pediatric): Secondary | ICD-10-CM | POA: Diagnosis not present

## 2023-10-03 ENCOUNTER — Ambulatory Visit: Admitting: Family Medicine

## 2023-10-03 ENCOUNTER — Ambulatory Visit: Payer: Self-pay | Admitting: Family Medicine

## 2023-10-03 ENCOUNTER — Encounter: Payer: Self-pay | Admitting: Family Medicine

## 2023-10-03 ENCOUNTER — Other Ambulatory Visit (HOSPITAL_BASED_OUTPATIENT_CLINIC_OR_DEPARTMENT_OTHER): Payer: Self-pay

## 2023-10-03 VITALS — BP 130/80 | HR 89 | Temp 98.0°F | Resp 16 | Ht 62.0 in | Wt 198.0 lb

## 2023-10-03 DIAGNOSIS — S46812D Strain of other muscles, fascia and tendons at shoulder and upper arm level, left arm, subsequent encounter: Secondary | ICD-10-CM

## 2023-10-03 DIAGNOSIS — S46812A Strain of other muscles, fascia and tendons at shoulder and upper arm level, left arm, initial encounter: Secondary | ICD-10-CM

## 2023-10-03 LAB — COLOGUARD: COLOGUARD: NEGATIVE

## 2023-10-03 MED ORDER — AMOXICILLIN 500 MG PO CAPS
500.0000 mg | ORAL_CAPSULE | ORAL | 0 refills | Status: DC
  Filled 2023-10-03: qty 8, 2d supply, fill #0

## 2023-10-03 NOTE — Patient Instructions (Signed)
 Heat (pad or rice pillow in microwave) over affected area, 10-15 minutes twice daily.   Ice/cold pack over area for 10-15 min twice daily.  OK to take Tylenol  1000 mg (2 extra strength tabs) or 975 mg (3 regular strength tabs) every 6 hours as needed.  Continue your stretches/exercises as best you can.   Let us  know if you need anything.

## 2023-10-03 NOTE — Progress Notes (Addendum)
 Musculoskeletal Exam  Patient: Kara Lee DOB: 1946-02-05  DOS: 10/03/2023  SUBJECTIVE:  Chief Complaint:   Chief Complaint  Patient presents with   Shoulder Pain    Shoulder Pain    Kara Lee is a 78 y.o.  female for evaluation and treatment of L trap pain.   Onset:  2 months ago. Was picking something heavy up.  Location: L trap Character:  aching  Progression of issue:  is unchanged Associated symptoms:  No swelling, bruising, redness Treatment: to date has been ice, home exercises, and heat.   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 2nd child   Diabetes mellitus (HCC)    Diverticulosis    Fatty liver    GERD (gastroesophageal reflux disease)    HLD (hyperlipidemia)    Hypertension    Morbid obesity (HCC)    OSA on CPAP    uses CPAP nightly   Osteoarthritis of right knee 07/31/2013   PONV (postoperative nausea and vomiting)    Rosacea    Severe aortic stenosis    Spinal stenosis     Objective: VITAL SIGNS: BP 130/80 (BP Location: Left Arm, Patient Position: Sitting)   Pulse 89   Temp 98 F (36.7 C) (Oral)   Resp 16   Ht 5' 2 (1.575 m)   Wt 198 lb (89.8 kg)   SpO2 98%   BMI 36.21 kg/m  Constitutional: Well formed, well developed. No acute distress. Thorax & Lungs: No accessory muscle use Musculoskeletal: L trap .   Tenderness to palpation: yes; over L trap Deformity: no Ecchymosis: no Tests positive: none Tests negative: Neer's, Hawkins, cross over, Speed's, lift off Neurologic: Normal sensory function.  Psychiatric: Normal mood. Age appropriate judgment and insight. Alert & oriented x 3.    Procedure note; trigger point injection of left trapezius Informed consent obtained. The areas of  interest were demarcated with an otoscope speculum tip. There were then cleaned with alcohol. 1 mL were injected at each trigger point (3). 40 mg of Depomedrol and 2 mL of 1% lido.  The areas were then bandaged. There were no complications noted. The patient tolerated the procedure well.  Assessment:  Strain of left trapezius muscle, subsequent encounter - Plan: INJECT TRIGGER POINT, 1 OR 2, INJECT TRIGGER POINTS, > 3  Plan: Stretches/exercises, heat, ice, Tylenol . Injections today. Will consider referral if no better.  F/u as originally scheduled. The patient voiced understanding and agreement to the plan.   Mabel Mt [place], DO 10/03/23  3:10 PM

## 2023-10-04 ENCOUNTER — Ambulatory Visit: Admitting: Internal Medicine

## 2023-10-05 ENCOUNTER — Telehealth: Payer: Self-pay

## 2023-10-05 ENCOUNTER — Other Ambulatory Visit: Payer: Self-pay

## 2023-10-05 DIAGNOSIS — Z01812 Encounter for preprocedural laboratory examination: Secondary | ICD-10-CM

## 2023-10-05 DIAGNOSIS — I471 Supraventricular tachycardia, unspecified: Secondary | ICD-10-CM

## 2023-10-05 DIAGNOSIS — I480 Paroxysmal atrial fibrillation: Secondary | ICD-10-CM

## 2023-10-05 NOTE — Telephone Encounter (Signed)
 Spoke with patient to complete pre-procedure call.     New medical conditions?  No Recent hospitalizations or surgeries? No Started any new medications? No - Pt only takes Novolin as needed at this time. Patient made aware to contact office to inform of any new medications started. Any changes in activities of daily living? No  Pre-procedure testing scheduled: CT on 7/9 and lab work on 7/3 Confirmed patient is taking Eliquis  twice daily and will continue taking medication before procedure or it may need to be rescheduled.  Confirmed patient is scheduled for Atrial Fibrillation Ablation on Wednesday, July 30 with Dr. Clinton Danas. Instructed patient to arrive at the Main Entrance A at [facility]: [address] [place], [place] [address] and check in at Admitting at 1130 AM  Advised of plan to go home the same day and will only stay overnight if medically necessary. You MUST have a responsible adult to drive you home and MUST be with you the first 24 hours after you arrive home or your procedure could be cancelled.  Patient verbalized understanding to information provided and is agreeable to proceed with procedure.

## 2023-10-06 ENCOUNTER — Other Ambulatory Visit: Payer: Self-pay

## 2023-10-06 ENCOUNTER — Ambulatory Visit (INDEPENDENT_AMBULATORY_CARE_PROVIDER_SITE_OTHER): Admitting: Podiatry

## 2023-10-06 ENCOUNTER — Other Ambulatory Visit (HOSPITAL_BASED_OUTPATIENT_CLINIC_OR_DEPARTMENT_OTHER): Payer: Self-pay

## 2023-10-06 DIAGNOSIS — M7751 Other enthesopathy of right foot: Secondary | ICD-10-CM

## 2023-10-06 DIAGNOSIS — Z91199 Patient's noncompliance with other medical treatment and regimen due to unspecified reason: Secondary | ICD-10-CM

## 2023-10-06 DIAGNOSIS — M7752 Other enthesopathy of left foot: Secondary | ICD-10-CM

## 2023-10-06 MED FILL — Losartan Potassium Tab 25 MG: ORAL | 90 days supply | Qty: 90 | Fill #1 | Status: AC

## 2023-10-06 MED FILL — Atenolol Tab 25 MG: ORAL | 90 days supply | Qty: 180 | Fill #1 | Status: AC

## 2023-10-06 NOTE — Progress Notes (Signed)
 No show

## 2023-10-07 ENCOUNTER — Other Ambulatory Visit (HOSPITAL_BASED_OUTPATIENT_CLINIC_OR_DEPARTMENT_OTHER): Payer: Self-pay

## 2023-10-07 ENCOUNTER — Encounter: Payer: Self-pay | Admitting: Family Medicine

## 2023-10-07 ENCOUNTER — Other Ambulatory Visit: Payer: Self-pay

## 2023-10-07 DIAGNOSIS — R053 Chronic cough: Secondary | ICD-10-CM

## 2023-10-07 MED ORDER — MOMETASONE FUROATE 50 MCG/ACT NA SUSP
2.0000 | Freq: Every day | NASAL | 5 refills | Status: AC
  Filled 2023-10-07: qty 17, 30d supply, fill #0
  Filled 2023-11-01: qty 17, 30d supply, fill #1
  Filled 2024-01-06: qty 17, 30d supply, fill #2
  Filled 2024-03-04: qty 17, 30d supply, fill #3
  Filled 2024-04-16: qty 17, 30d supply, fill #4

## 2023-10-09 ENCOUNTER — Emergency Department (HOSPITAL_BASED_OUTPATIENT_CLINIC_OR_DEPARTMENT_OTHER)
Admission: EM | Admit: 2023-10-09 | Discharge: 2023-10-09 | Disposition: A | Discharge status: Home / Self Care | Attending: Emergency Medicine | Admitting: Emergency Medicine

## 2023-10-09 ENCOUNTER — Encounter (HOSPITAL_BASED_OUTPATIENT_CLINIC_OR_DEPARTMENT_OTHER): Payer: Self-pay | Admitting: Emergency Medicine

## 2023-10-09 ENCOUNTER — Other Ambulatory Visit: Payer: Self-pay

## 2023-10-09 DIAGNOSIS — Z7901 Long term (current) use of anticoagulants: Secondary | ICD-10-CM | POA: Diagnosis not present

## 2023-10-09 DIAGNOSIS — Z91040 Latex allergy status: Secondary | ICD-10-CM | POA: Insufficient documentation

## 2023-10-09 DIAGNOSIS — K1379 Other lesions of oral mucosa: Secondary | ICD-10-CM | POA: Insufficient documentation

## 2023-10-09 DIAGNOSIS — K068 Other specified disorders of gingiva and edentulous alveolar ridge: Secondary | ICD-10-CM | POA: Diagnosis not present

## 2023-10-09 DIAGNOSIS — I509 Heart failure, unspecified: Secondary | ICD-10-CM | POA: Diagnosis not present

## 2023-10-09 MED ORDER — OXIDIZED CELLULOSE EX PADS
1.0000 | MEDICATED_PAD | Freq: Once | CUTANEOUS | Status: DC
  Filled 2023-10-09: qty 1

## 2023-10-09 NOTE — ED Notes (Signed)
 Pt received call back from her dentist and left to go to her appointment. PA, Warren Shad aware and placed discharge orders.   Aleck FORBES Molt, RN

## 2023-10-09 NOTE — ED Provider Notes (Signed)
 [place] [facility] Provider Note   CSN: [id number] Arrival date & time: 10/09/23  1344     Patient presents with: Oral Bleeding   Kara Lee is a 78 y.o. female.  With past medical history of hyperlipidemia, CHF, TAVR, on Eliquis  presents to emergency room status post tooth extraction from a left upper molar on Thursday.  She had originally been told to hold her Eliquis  and she was okay to resume yesterday.  She has not taken Eliquis  today.  Noticed at 7 AM she was having progressive small amount of bleeding from her extraction site.  She has tried direct pressure for several hours without any improvement.  Continues to have small amount of oozing blood.  Has tried to contact dentist but has not yet gotten a hold of them.   HPI     Prior to Admission medications   Medication Sig Start Date End Date Taking? Authorizing Provider  amoxicillin  (AMOXIL ) 500 MG capsule Take 4 capsules by mouth 1 hour before treatment 09/29/23     apixaban  (ELIQUIS ) 5 MG TABS tablet Take 1 tablet (5 mg total) by mouth 2 (two) times daily. 07/18/23 01/14/24  Frann Mabel Mt, DO  atenolol  (TENORMIN ) 25 MG tablet Take 1 tablet (25 mg total) by mouth 2 (two) times daily. 01/05/23   Ladona Heinz, MD  Blood Glucose Monitoring Suppl DEVI 1 each by Does not apply route in the morning, at noon, and at bedtime. May substitute to any manufacturer covered by patient's insurance. 06/03/23   Antonio Cyndee Jamee JONELLE, DO  cetirizine (ZYRTEC) 10 MG tablet Take 10 mg by mouth in the morning.    [provider]  diltiazem  (CARDIZEM ) 30 MG tablet Take 1 tablet (30 mg total) by mouth every 4 (four) hours as needed for heart rate > 100 and systolic blood pressure > 100. 08/22/23   Fenton, Clint R, PA  DULoxetine  (CYMBALTA ) 60 MG capsule Take 1 capsule (60 mg total) by mouth daily. 08/15/23   Frann Mabel Mt, DO  EPINEPHrine  (EPIPEN  2-PAK) 0.3 mg/0.3 mL IJ SOAJ injection Inject 0.3  mg into the muscle as needed for anaphylaxis. 03/15/22   Marinda Rocky SAILOR, MD  insulin  regular (NOVOLIN R) 100 units/mL injection Inject into the skin daily per sliding scale. Max 40 units a day. 06/03/23   Antonio Cyndee Jamee JONELLE, DO  losartan  (COZAAR ) 25 MG tablet Take 1 tablet (25 mg total) by mouth daily. 04/11/23   Frann Mabel Mt, DO  metFORMIN  (GLUCOPHAGE -XR) 500 MG 24 hr tablet Take 1 tablet (500 mg total) by mouth 2 (two) times daily with a meal. 02/14/23   Wendling, Mabel Mt, DO  methocarbamol  (ROBAXIN ) 500 MG tablet Take 1 tablet (500 mg total) by mouth every 6 (six) to 8 (eight) hours as needed. 04/11/23   Frann Mabel Mt, DO  mometasone  (NASONEX ) 50 MCG/ACT nasal spray Place 2 sprays into the nose daily. 10/07/23   Frann Mabel Mt, DO  omeprazole  (PRILOSEC) 40 MG capsule Take 1 capsule (40 mg total) by mouth daily. 02/04/23   Frann Mabel Mt, DO  Pitavastatin  Magnesium  (ZYPITAMAG ) 4 MG TABS Take 1 tablet (4 mg total) by mouth daily. 04/26/23   Ladona Heinz, MD  SYSTANE COMPLETE PF 0.6 % SOLN Place 1 drop into both eyes See admin instructions. Instill 1 drop into both eyes two to three times a day    [provider]  tirzepatide  (MOUNJARO ) 5 MG/0.5ML Pen Inject 5 mg into  the skin once a week. 09/16/23 01/06/24  Frann Mabel Mt, DO  triamcinolone  cream in Minerin Creme Apply topically 2 (two) times daily as needed for itching. 07/18/23   Frann Mabel Mt, DO  TYLENOL  8 HOUR 650 MG CR tablet Take 1,300 mg by mouth every 8 (eight) hours as needed for pain.    [provider]    Allergies: Bee venom, Latex, Lovenox [enoxaparin sodium], Minocycline, Morphine  and codeine, Nickel, Shellfish allergy, Statins, Sulfa antibiotics, Tape, Cipro  [ciprofloxacin  hcl], Glimepiride, and Metronidazole     Review of Systems  HENT:  Positive for dental problem.     Updated Vital Signs BP 104/63 (BP Location: Right Arm)   Pulse 73   Temp (!) 97.5  F (36.4 C)   Resp 18   Ht 5' 2 (1.575 m)   Wt 89.8 kg   SpO2 97%   BMI 36.21 kg/m   Physical Exam Vitals and nursing note reviewed.  Constitutional:      General: She is not in acute distress.    Appearance: She is not toxic-appearing.  HENT:     Head: Normocephalic and atraumatic.     Mouth/Throat:    Eyes:     General: No scleral icterus.    Conjunctiva/sclera: Conjunctivae normal.    Cardiovascular:     Rate and Rhythm: Normal rate and regular rhythm.     Pulses: Normal pulses.     Heart sounds: Normal heart sounds.  Pulmonary:     Effort: Pulmonary effort is normal. No respiratory distress.     Breath sounds: Normal breath sounds.  Abdominal:     General: Abdomen is flat. Bowel sounds are normal.     Palpations: Abdomen is soft.     Tenderness: There is no abdominal tenderness.   Skin:    General: Skin is warm and dry.     Findings: No lesion.   Neurological:     General: No focal deficit present.     Mental Status: She is alert and oriented to person, place, and time. Mental status is at baseline.     (all labs ordered are listed, but only abnormal results are displayed) Labs Reviewed - No data to display  EKG: None  Radiology: No results found.   Procedures   Medications Ordered in the ED - No data to display                                  Medical Decision Making  This patient presents to the ED for concern of tooth bleeding, this involves an extensive number of treatment options, and is a complaint that carries with it a high risk of complications and morbidity.  The differential diagnosis includes abscess, laceration, abrasion   Co morbidities that complicate the patient evaluation  On Eliquis    Cardiac Monitoring: / EKG:  The patient was maintained on a cardiac monitor.    Problem List / ED Course / Critical interventions / Medication management  Patient presents to emergency room with complaint of oral bleeding from  extraction site.  Had tooth pulled 4 days ago.  She tried to contact her dentist.  She is try direct pressure.  On physical exam she does have small amount of oozing blood.  She has already failed direct pressure.  Will TXA or surgicel with direct pressure. Vitals stable, well appearing.  Upon reassessment patient had gotten a hold of her dentist.  Her  dentist can get her in immediately today.  She will be discharged to go directly to her dentist.  Stable well-appearing.  Feel she is appropriate for discharge with follow-up with dentist.      Final diagnoses:  Oral bleeding    ED Discharge Orders     None          Shermon Warren SAILOR, PA-C 10/09/23 1514    Elnor Savant A, DO 10/11/23 1920

## 2023-10-09 NOTE — ED Triage Notes (Addendum)
 Pt sts she had one tooth extraction on Thurs; was doing ok until she felt something let loose this morning and then had a large amt of bleeding from the extraction site; no active bleeding at this time; has taken Eliquis  once in 5 day (yesterday), did not take today

## 2023-10-09 NOTE — Discharge Instructions (Signed)
 Will go directly to dentist.

## 2023-10-11 ENCOUNTER — Other Ambulatory Visit (HOSPITAL_BASED_OUTPATIENT_CLINIC_OR_DEPARTMENT_OTHER): Payer: Self-pay

## 2023-10-11 ENCOUNTER — Telehealth: Payer: Self-pay

## 2023-10-11 NOTE — Telephone Encounter (Signed)
 Pt called in stating she would like to cancel her Afib Ablation with Dr. Kennyth on 7/30. She said she has too much going on at this time. She had a fall, she had a tooth pulled and even though she held her Eliquis  she ended up at the ED because she couldn't get it to stop bleeding.   She would like to cancel the procedure and f/u with Dr. Kennyth to discuss her blood thinner/situation.   I have cancelled her CT and Ablation and sent message to scheduler to call her to schedule her an appt with JP.

## 2023-10-12 ENCOUNTER — Ambulatory Visit: Admitting: Podiatry

## 2023-10-12 ENCOUNTER — Other Ambulatory Visit (INDEPENDENT_AMBULATORY_CARE_PROVIDER_SITE_OTHER): Payer: Self-pay

## 2023-10-12 DIAGNOSIS — S46812A Strain of other muscles, fascia and tendons at shoulder and upper arm level, left arm, initial encounter: Secondary | ICD-10-CM

## 2023-10-12 MED ORDER — METHYLPREDNISOLONE ACETATE 40 MG/ML IJ SUSP
40.0000 mg | Freq: Once | INTRAMUSCULAR | Status: AC
  Administered 2023-10-03: 40 mg via INTRAMUSCULAR

## 2023-10-13 ENCOUNTER — Encounter: Payer: Self-pay | Admitting: Family Medicine

## 2023-10-18 ENCOUNTER — Encounter: Payer: Self-pay | Admitting: Family Medicine

## 2023-10-18 ENCOUNTER — Other Ambulatory Visit: Payer: Self-pay

## 2023-10-18 ENCOUNTER — Other Ambulatory Visit (HOSPITAL_BASED_OUTPATIENT_CLINIC_OR_DEPARTMENT_OTHER): Payer: Self-pay

## 2023-10-18 DIAGNOSIS — I480 Paroxysmal atrial fibrillation: Secondary | ICD-10-CM

## 2023-10-18 MED ORDER — TIRZEPATIDE 5 MG/0.5ML ~~LOC~~ SOAJ
5.0000 mg | SUBCUTANEOUS | 3 refills | Status: DC
Start: 2023-10-18 — End: 2023-11-23
  Filled 2023-10-18: qty 2, 28d supply, fill #0
  Filled 2023-11-09: qty 2, 28d supply, fill #1

## 2023-10-24 ENCOUNTER — Other Ambulatory Visit (HOSPITAL_BASED_OUTPATIENT_CLINIC_OR_DEPARTMENT_OTHER): Payer: Self-pay

## 2023-10-24 DIAGNOSIS — M25512 Pain in left shoulder: Secondary | ICD-10-CM | POA: Diagnosis not present

## 2023-10-24 DIAGNOSIS — M542 Cervicalgia: Secondary | ICD-10-CM | POA: Diagnosis not present

## 2023-10-24 MED ORDER — METHYLPREDNISOLONE 4 MG PO TBPK
ORAL_TABLET | ORAL | 0 refills | Status: DC
  Filled 2023-10-24: qty 21, 6d supply, fill #0

## 2023-10-26 ENCOUNTER — Ambulatory Visit (HOSPITAL_COMMUNITY): Admission: RE | Admit: 2023-10-26 | Source: Ambulatory Visit

## 2023-10-27 ENCOUNTER — Ambulatory Visit: Admitting: Cardiology

## 2023-10-31 DIAGNOSIS — G4733 Obstructive sleep apnea (adult) (pediatric): Secondary | ICD-10-CM | POA: Diagnosis not present

## 2023-11-01 ENCOUNTER — Other Ambulatory Visit: Payer: Self-pay

## 2023-11-03 ENCOUNTER — Ambulatory Visit: Admitting: Podiatry

## 2023-11-03 ENCOUNTER — Encounter: Payer: Self-pay | Admitting: Podiatry

## 2023-11-03 DIAGNOSIS — L84 Corns and callosities: Secondary | ICD-10-CM

## 2023-11-03 DIAGNOSIS — E1142 Type 2 diabetes mellitus with diabetic polyneuropathy: Secondary | ICD-10-CM | POA: Diagnosis not present

## 2023-11-03 NOTE — Progress Notes (Signed)
  Subjective:  Patient ID: Kara Lee, female    DOB: [birth year],   MRN: [id number]  Chief Complaint  Patient presents with   Callouses    I have calluses on the bottom of my big toes.    78 y.o. female presents for concern of bilateral great toe calluses that have been present for a while. They have been very painful. Requesting to have them trimmed today. Denies burning and tingling in their feet. Patient is diabetic and last A1c was  Lab Results  Component Value Date   HGBA1C 7.8 (H) 07/18/2023   .   PCP:  Frann Mabel Mt, DO    . Denies any other pedal complaints. Denies n/v/f/c.   Past Medical History:  Diagnosis Date   Anxiety    Cardiac arrhythmia    CHF (congestive heart failure) (HCC)    Colon polyp    Complication of anesthesia    Concussion 11/09/2012   From a fall at home on deck   Depression    postpartum depression after 2nd child   Diabetes mellitus (HCC)    Diverticulosis    Fatty liver    GERD (gastroesophageal reflux disease)    HLD (hyperlipidemia)    Hypertension    Morbid obesity (HCC)    OSA on CPAP    uses CPAP nightly   Osteoarthritis of right knee 07/31/2013   PONV (postoperative nausea and vomiting)    Rosacea    Severe aortic stenosis    Spinal stenosis     Objective:  Physical Exam: Vascular: DP/PT pulses 2/4 bilateral. CFT <3 seconds. Absent hair growth on digits. Edema noted to bilateral lower extremities. Xerosis noted bilaterally. Varicose and spider veins noted to left lower extremity.  Skin. No lacerations or abrasions bilateral feet. Nails 1-5 bilateral  are normal in appearance. Hyperkeartotic lesions plantar hallux bilateral nearly ulcerative.  Musculoskeletal: MMT 5/5 bilateral lower extremities in DF, PF, Inversion and Eversion. Deceased ROM in DF of ankle joint.  Neurological: Sensation intact to light touch. Protective sensation  intact  bilateral.    Assessment:   1. Pre-ulcerative calluses   2. Type 2  diabetes mellitus with peripheral neuropathy (HCC)      Plan:  Patient was evaluated and treated and all questions answered. -Discussed and educated patient on diabetic foot care, especially with  regards to the vascular, neurological and musculoskeletal systems.  -Stressed the importance of good glycemic control and the detriment of not  controlling glucose levels in relation to the foot. -Discussed supportive shoes at all times and checking feet regularly.  -Mechanically debrided hyperkeratotic lesions plantar hallux bilateral without incident.  -Referral provided to vein specialist for spider veins at patient request.  -Answered all patient questions -Patient to return  in 3 months for at risk foot care -Patient advised to call the office if any problems or questions arise in the meantime.   Asberry Failing, DPM

## 2023-11-04 DIAGNOSIS — G4733 Obstructive sleep apnea (adult) (pediatric): Secondary | ICD-10-CM | POA: Diagnosis not present

## 2023-11-09 ENCOUNTER — Telehealth: Payer: Self-pay

## 2023-11-09 NOTE — Telephone Encounter (Signed)
Sent pt mychart to schedule appt

## 2023-11-09 NOTE — Telephone Encounter (Signed)
 Copied from CRM [id number]. Topic: Clinical - Medication Question >> Nov 09, 2023  2:45 PM Aisha D wrote: Reason for CRM: Pt stated that she needs an increase on the iDULoxetine (CYMBALTA ) 60 MG capsules due to currently taking care of her husband. Pt would like a callback on her mobile number with an update on her request.

## 2023-11-11 ENCOUNTER — Ambulatory Visit: Admitting: Cardiology

## 2023-11-14 ENCOUNTER — Inpatient Hospital Stay (HOSPITAL_COMMUNITY)
Admission: EM | Admit: 2023-11-14 | Discharge: 2023-11-15 | DRG: 871 | Disposition: A | Discharge status: Home / Self Care | Attending: Pulmonary Disease | Admitting: Pulmonary Disease

## 2023-11-14 ENCOUNTER — Encounter (HOSPITAL_COMMUNITY): Payer: Self-pay | Admitting: Emergency Medicine

## 2023-11-14 ENCOUNTER — Emergency Department (HOSPITAL_COMMUNITY)

## 2023-11-14 ENCOUNTER — Other Ambulatory Visit: Payer: Self-pay

## 2023-11-14 DIAGNOSIS — I350 Nonrheumatic aortic (valve) stenosis: Secondary | ICD-10-CM | POA: Diagnosis present

## 2023-11-14 DIAGNOSIS — N390 Urinary tract infection, site not specified: Secondary | ICD-10-CM | POA: Diagnosis present

## 2023-11-14 DIAGNOSIS — R7989 Other specified abnormal findings of blood chemistry: Secondary | ICD-10-CM

## 2023-11-14 DIAGNOSIS — Z7984 Long term (current) use of oral hypoglycemic drugs: Secondary | ICD-10-CM

## 2023-11-14 DIAGNOSIS — R002 Palpitations: Secondary | ICD-10-CM | POA: Diagnosis present

## 2023-11-14 DIAGNOSIS — M797 Fibromyalgia: Secondary | ICD-10-CM | POA: Diagnosis not present

## 2023-11-14 DIAGNOSIS — Z860100 Personal history of colon polyps, unspecified: Secondary | ICD-10-CM

## 2023-11-14 DIAGNOSIS — J9690 Respiratory failure, unspecified, unspecified whether with hypoxia or hypercapnia: Secondary | ICD-10-CM | POA: Diagnosis not present

## 2023-11-14 DIAGNOSIS — Z91040 Latex allergy status: Secondary | ICD-10-CM

## 2023-11-14 DIAGNOSIS — R911 Solitary pulmonary nodule: Secondary | ICD-10-CM | POA: Diagnosis present

## 2023-11-14 DIAGNOSIS — I110 Hypertensive heart disease with heart failure: Secondary | ICD-10-CM | POA: Diagnosis not present

## 2023-11-14 DIAGNOSIS — R0602 Shortness of breath: Secondary | ICD-10-CM | POA: Diagnosis present

## 2023-11-14 DIAGNOSIS — F329 Major depressive disorder, single episode, unspecified: Secondary | ICD-10-CM | POA: Diagnosis present

## 2023-11-14 DIAGNOSIS — R61 Generalized hyperhidrosis: Secondary | ICD-10-CM | POA: Diagnosis present

## 2023-11-14 DIAGNOSIS — E872 Acidosis, unspecified: Secondary | ICD-10-CM | POA: Diagnosis present

## 2023-11-14 DIAGNOSIS — Z0389 Encounter for observation for other suspected diseases and conditions ruled out: Secondary | ICD-10-CM | POA: Diagnosis not present

## 2023-11-14 DIAGNOSIS — Z833 Family history of diabetes mellitus: Secondary | ICD-10-CM

## 2023-11-14 DIAGNOSIS — Z7901 Long term (current) use of anticoagulants: Secondary | ICD-10-CM | POA: Diagnosis not present

## 2023-11-14 DIAGNOSIS — Z803 Family history of malignant neoplasm of breast: Secondary | ICD-10-CM

## 2023-11-14 DIAGNOSIS — R6521 Severe sepsis with septic shock: Secondary | ICD-10-CM | POA: Diagnosis not present

## 2023-11-14 DIAGNOSIS — I480 Paroxysmal atrial fibrillation: Secondary | ICD-10-CM | POA: Diagnosis present

## 2023-11-14 DIAGNOSIS — E1169 Type 2 diabetes mellitus with other specified complication: Secondary | ICD-10-CM | POA: Diagnosis not present

## 2023-11-14 DIAGNOSIS — Z91048 Other nonmedicinal substance allergy status: Secondary | ICD-10-CM

## 2023-11-14 DIAGNOSIS — E785 Hyperlipidemia, unspecified: Secondary | ICD-10-CM | POA: Diagnosis not present

## 2023-11-14 DIAGNOSIS — Z7985 Long-term (current) use of injectable non-insulin antidiabetic drugs: Secondary | ICD-10-CM | POA: Diagnosis not present

## 2023-11-14 DIAGNOSIS — Z953 Presence of xenogenic heart valve: Secondary | ICD-10-CM | POA: Diagnosis not present

## 2023-11-14 DIAGNOSIS — Z96651 Presence of right artificial knee joint: Secondary | ICD-10-CM | POA: Diagnosis present

## 2023-11-14 DIAGNOSIS — I471 Supraventricular tachycardia, unspecified: Secondary | ICD-10-CM | POA: Diagnosis not present

## 2023-11-14 DIAGNOSIS — Z79899 Other long term (current) drug therapy: Secondary | ICD-10-CM | POA: Diagnosis not present

## 2023-11-14 DIAGNOSIS — K8689 Other specified diseases of pancreas: Secondary | ICD-10-CM | POA: Diagnosis not present

## 2023-11-14 DIAGNOSIS — I5032 Chronic diastolic (congestive) heart failure: Secondary | ICD-10-CM | POA: Diagnosis present

## 2023-11-14 DIAGNOSIS — Z1152 Encounter for screening for COVID-19: Secondary | ICD-10-CM | POA: Diagnosis not present

## 2023-11-14 DIAGNOSIS — I700 Atherosclerosis of aorta: Secondary | ICD-10-CM | POA: Diagnosis not present

## 2023-11-14 DIAGNOSIS — Z91013 Allergy to seafood: Secondary | ICD-10-CM

## 2023-11-14 DIAGNOSIS — Z8673 Personal history of transient ischemic attack (TIA), and cerebral infarction without residual deficits: Secondary | ICD-10-CM

## 2023-11-14 DIAGNOSIS — Z888 Allergy status to other drugs, medicaments and biological substances status: Secondary | ICD-10-CM

## 2023-11-14 DIAGNOSIS — Z91030 Bee allergy status: Secondary | ICD-10-CM

## 2023-11-14 DIAGNOSIS — Z794 Long term (current) use of insulin: Secondary | ICD-10-CM

## 2023-11-14 DIAGNOSIS — Z8249 Family history of ischemic heart disease and other diseases of the circulatory system: Secondary | ICD-10-CM | POA: Diagnosis not present

## 2023-11-14 DIAGNOSIS — R000 Tachycardia, unspecified: Secondary | ICD-10-CM | POA: Diagnosis not present

## 2023-11-14 DIAGNOSIS — Z825 Family history of asthma and other chronic lower respiratory diseases: Secondary | ICD-10-CM

## 2023-11-14 DIAGNOSIS — A419 Sepsis, unspecified organism: Secondary | ICD-10-CM | POA: Diagnosis not present

## 2023-11-14 DIAGNOSIS — Z9851 Tubal ligation status: Secondary | ICD-10-CM

## 2023-11-14 DIAGNOSIS — N281 Cyst of kidney, acquired: Secondary | ICD-10-CM | POA: Diagnosis not present

## 2023-11-14 DIAGNOSIS — Z90710 Acquired absence of both cervix and uterus: Secondary | ICD-10-CM

## 2023-11-14 DIAGNOSIS — E8729 Other acidosis: Secondary | ICD-10-CM

## 2023-11-14 DIAGNOSIS — G4733 Obstructive sleep apnea (adult) (pediatric): Secondary | ICD-10-CM | POA: Diagnosis present

## 2023-11-14 DIAGNOSIS — Z883 Allergy status to other anti-infective agents status: Secondary | ICD-10-CM

## 2023-11-14 DIAGNOSIS — R231 Pallor: Secondary | ICD-10-CM | POA: Diagnosis not present

## 2023-11-14 DIAGNOSIS — Z881 Allergy status to other antibiotic agents status: Secondary | ICD-10-CM

## 2023-11-14 DIAGNOSIS — R579 Shock, unspecified: Principal | ICD-10-CM

## 2023-11-14 DIAGNOSIS — K760 Fatty (change of) liver, not elsewhere classified: Secondary | ICD-10-CM | POA: Diagnosis not present

## 2023-11-14 DIAGNOSIS — Z885 Allergy status to narcotic agent status: Secondary | ICD-10-CM

## 2023-11-14 DIAGNOSIS — K219 Gastro-esophageal reflux disease without esophagitis: Secondary | ICD-10-CM | POA: Diagnosis present

## 2023-11-14 DIAGNOSIS — B965 Pseudomonas (aeruginosa) (mallei) (pseudomallei) as the cause of diseases classified elsewhere: Secondary | ICD-10-CM | POA: Diagnosis present

## 2023-11-14 DIAGNOSIS — I959 Hypotension, unspecified: Secondary | ICD-10-CM | POA: Diagnosis not present

## 2023-11-14 DIAGNOSIS — M1711 Unilateral primary osteoarthritis, right knee: Secondary | ICD-10-CM | POA: Diagnosis present

## 2023-11-14 DIAGNOSIS — R350 Frequency of micturition: Secondary | ICD-10-CM | POA: Diagnosis present

## 2023-11-14 LAB — URINALYSIS, W/ REFLEX TO CULTURE (INFECTION SUSPECTED)
Bacteria, UA: NONE SEEN
Bilirubin Urine: NEGATIVE
Glucose, UA: NEGATIVE mg/dL
Hgb urine dipstick: NEGATIVE
Ketones, ur: 5 mg/dL — AB
Nitrite: NEGATIVE
Protein, ur: 30 mg/dL — AB
Specific Gravity, Urine: 1.01 (ref 1.005–1.030)
WBC, UA: >50 WBC/hpf (ref 0–5)
pH: 7 (ref 5.0–8.0)

## 2023-11-14 LAB — COMPREHENSIVE METABOLIC PANEL WITH GFR
ALT: 16 U/L (ref 0–44)
AST: 20 U/L (ref 15–41)
Albumin: 3 g/dL — ABNORMAL LOW (ref 3.5–5.0)
Alkaline Phosphatase: 56 U/L (ref 38–126)
Anion gap: 13 (ref 5–15)
BUN: 16 mg/dL (ref 8–23)
CO2: 15 mmol/L — ABNORMAL LOW (ref 22–32)
Calcium: 8.6 mg/dL — ABNORMAL LOW (ref 8.9–10.3)
Chloride: 110 mmol/L (ref 98–111)
Creatinine, Ser: 0.71 mg/dL (ref 0.44–1.00)
GFR, Estimated: >60 mL/min (ref >60)
Glucose, Bld: 134 mg/dL — ABNORMAL HIGH (ref 70–99)
Potassium: 3.6 mmol/L (ref 3.5–5.1)
Sodium: 138 mmol/L (ref 135–145)
Total Bilirubin: 0.6 mg/dL (ref 0.0–1.2)
Total Protein: 5.9 g/dL — ABNORMAL LOW (ref 6.5–8.1)

## 2023-11-14 LAB — CBC WITH DIFFERENTIAL/PLATELET
Abs Immature Granulocytes: 0.05 K/uL (ref 0.00–0.07)
Basophils Absolute: 0.1 K/uL (ref 0.0–0.1)
Basophils Relative: 1 %
Eosinophils Absolute: 0.2 K/uL (ref 0.0–0.5)
Eosinophils Relative: 1 %
HCT: 36.7 % (ref 36.0–46.0)
Hemoglobin: 11.6 g/dL — ABNORMAL LOW (ref 12.0–15.0)
Immature Granulocytes: 0 %
Lymphocytes Relative: 29 %
Lymphs Abs: 3.4 K/uL (ref 0.7–4.0)
MCH: 29.9 pg (ref 26.0–34.0)
MCHC: 31.6 g/dL (ref 30.0–36.0)
MCV: 94.6 fL (ref 80.0–100.0)
Monocytes Absolute: 0.7 K/uL (ref 0.1–1.0)
Monocytes Relative: 6 %
Neutro Abs: 7.5 K/uL (ref 1.7–7.7)
Neutrophils Relative %: 63 %
Platelets: 145 K/uL — ABNORMAL LOW (ref 150–400)
RBC: 3.88 MIL/uL (ref 3.87–5.11)
RDW: 13.6 % (ref 11.5–15.5)
WBC: 12 K/uL — ABNORMAL HIGH (ref 4.0–10.5)
nRBC: 0 % (ref 0.0–0.2)

## 2023-11-14 LAB — MRSA NEXT GEN BY PCR, NASAL: MRSA by PCR Next Gen: NOT DETECTED

## 2023-11-14 LAB — APTT: aPTT: 45 s — ABNORMAL HIGH (ref 24–36)

## 2023-11-14 LAB — I-STAT CG4 LACTIC ACID, ED
Lactic Acid, Venous: 3.3 mmol/L (ref 0.5–1.9)
Lactic Acid, Venous: 2.6 mmol/L (ref 0.5–1.9)

## 2023-11-14 LAB — BRAIN NATRIURETIC PEPTIDE: B Natriuretic Peptide: 172 pg/mL — ABNORMAL HIGH (ref 0.0–100.0)

## 2023-11-14 LAB — GLUCOSE, CAPILLARY
Glucose-Capillary: 116 mg/dL — ABNORMAL HIGH (ref 70–99)
Glucose-Capillary: 212 mg/dL — ABNORMAL HIGH (ref 70–99)
Glucose-Capillary: 187 mg/dL — ABNORMAL HIGH (ref 70–99)
Glucose-Capillary: 195 mg/dL — ABNORMAL HIGH (ref 70–99)
Glucose-Capillary: 228 mg/dL — ABNORMAL HIGH (ref 70–99)

## 2023-11-14 LAB — RESP PANEL BY RT-PCR (RSV, FLU A&B, COVID)  RVPGX2
Influenza A by PCR: NEGATIVE
Influenza B by PCR: NEGATIVE
Resp Syncytial Virus by PCR: NEGATIVE
SARS Coronavirus 2 by RT PCR: NEGATIVE

## 2023-11-14 LAB — CORTISOL: Cortisol, Plasma: 9.2 ug/dL

## 2023-11-14 LAB — LACTIC ACID, PLASMA: Lactic Acid, Venous: 1.8 mmol/L (ref 0.5–1.9)

## 2023-11-14 LAB — TROPONIN I (HIGH SENSITIVITY)
Troponin I (High Sensitivity): 6 ng/L (ref <18)
Troponin I (High Sensitivity): 212 ng/L (ref <18)

## 2023-11-14 LAB — PROTIME-INR
INR: 1.1 (ref 0.8–1.2)
Prothrombin Time: 15.2 s (ref 11.4–15.2)

## 2023-11-14 MED ORDER — CHLORHEXIDINE GLUCONATE CLOTH 2 % EX PADS
6.0000 | MEDICATED_PAD | Freq: Every day | CUTANEOUS | Status: DC
  Administered 2023-11-14: 6 via TOPICAL

## 2023-11-14 MED ORDER — INSULIN ASPART 100 UNIT/ML IJ SOLN
0.0000 [IU] | INTRAMUSCULAR | Status: DC
  Administered 2023-11-14 (×2): 7 [IU] via SUBCUTANEOUS
  Administered 2023-11-14 – 2023-11-15 (×3): 4 [IU] via SUBCUTANEOUS

## 2023-11-14 MED ORDER — HEPARIN (PORCINE) 25000 UT/250ML-% IV SOLN
1350.0000 [IU]/h | INTRAVENOUS | Status: DC
  Filled 2023-11-14: qty 250

## 2023-11-14 MED ORDER — HYDROCORTISONE SOD SUC (PF) 100 MG IJ SOLR
100.0000 mg | Freq: Once | INTRAMUSCULAR | Status: AC
  Administered 2023-11-14: 100 mg via INTRAVENOUS
  Filled 2023-11-14: qty 2

## 2023-11-14 MED ORDER — SODIUM CHLORIDE 0.9 % IV SOLN: 250.0000 mL | INTRAVENOUS | Status: AC

## 2023-11-14 MED ORDER — IOHEXOL 350 MG/ML SOLN
75.0000 mL | Freq: Once | INTRAVENOUS | Status: AC | PRN
  Administered 2023-11-14: 75 mL via INTRAVENOUS

## 2023-11-14 MED ORDER — PIPERACILLIN-TAZOBACTAM 3.375 G IVPB 30 MIN
3.3750 g | Freq: Once | INTRAVENOUS | Status: AC
  Administered 2023-11-14: 3.375 g via INTRAVENOUS
  Filled 2023-11-14: qty 50

## 2023-11-14 MED ORDER — HEPARIN (PORCINE) 25000 UT/250ML-% IV SOLN
1000.0000 [IU]/h | INTRAVENOUS | Status: DC
  Administered 2023-11-14: 1000 [IU]/h via INTRAVENOUS
  Filled 2023-11-14: qty 250

## 2023-11-14 MED ORDER — NOREPINEPHRINE 4 MG/250ML-% IV SOLN
0.0000 ug/min | INTRAVENOUS | Status: DC
  Administered 2023-11-14: 2 ug/min via INTRAVENOUS
  Filled 2023-11-14: qty 250

## 2023-11-14 MED ORDER — LACTATED RINGERS IV SOLN: INTRAVENOUS | Status: DC

## 2023-11-14 MED ORDER — HEPARIN SODIUM (PORCINE) 5000 UNIT/ML IJ SOLN: 5000.0000 [IU] | Freq: Three times a day (TID) | INTRAMUSCULAR | Status: DC

## 2023-11-14 MED ORDER — DOCUSATE SODIUM 100 MG PO CAPS
100.0000 mg | ORAL_CAPSULE | Freq: Two times a day (BID) | ORAL | Status: DC | PRN
Start: 2023-11-14 — End: 2023-11-15

## 2023-11-14 MED ORDER — NOREPINEPHRINE 4 MG/250ML-% IV SOLN
0.0000 ug/min | INTRAVENOUS | Status: DC
  Administered 2023-11-14: 2 ug/min via INTRAVENOUS

## 2023-11-14 MED ORDER — LACTATED RINGERS IV SOLN: INTRAVENOUS | Status: AC

## 2023-11-14 MED ORDER — ALBUMIN HUMAN 25 % IV SOLN
25.0000 g | Freq: Four times a day (QID) | INTRAVENOUS | Status: AC
  Administered 2023-11-14 – 2023-11-15 (×3): 25 g via INTRAVENOUS
  Filled 2023-11-14 (×3): qty 100

## 2023-11-14 MED ORDER — VANCOMYCIN HCL IN DEXTROSE 1-5 GM/200ML-% IV SOLN: 1000.0000 mg | Freq: Once | INTRAVENOUS | Status: DC

## 2023-11-14 MED ORDER — HYDROCORTISONE SOD SUC (PF) 100 MG IJ SOLR
100.0000 mg | Freq: Three times a day (TID) | INTRAMUSCULAR | Status: DC
  Administered 2023-11-14 – 2023-11-15 (×3): 100 mg via INTRAVENOUS
  Filled 2023-11-14 (×3): qty 2

## 2023-11-14 MED ORDER — LACTATED RINGERS IV BOLUS
500.0000 mL | Freq: Once | INTRAVENOUS | Status: AC
  Administered 2023-11-14: 500 mL via INTRAVENOUS

## 2023-11-14 MED ORDER — ORAL CARE MOUTH RINSE
15.0000 mL | OROMUCOSAL | Status: DC | PRN
Start: 2023-11-14 — End: 2023-11-15

## 2023-11-14 MED ORDER — ALBUMIN HUMAN 25 % IV SOLN
25.0000 g | Freq: Four times a day (QID) | INTRAVENOUS | Status: DC
  Administered 2023-11-14: 25 g via INTRAVENOUS
  Filled 2023-11-14: qty 100

## 2023-11-14 MED ORDER — PIPERACILLIN-TAZOBACTAM 3.375 G IVPB
3.3750 g | Freq: Three times a day (TID) | INTRAVENOUS | Status: DC
  Administered 2023-11-14 – 2023-11-15 (×3): 3.375 g via INTRAVENOUS
  Filled 2023-11-14 (×3): qty 50

## 2023-11-14 MED ORDER — VANCOMYCIN HCL 1500 MG/300ML IV SOLN
1500.0000 mg | Freq: Once | INTRAVENOUS | Status: AC
  Administered 2023-11-14: 1500 mg via INTRAVENOUS
  Filled 2023-11-14: qty 300

## 2023-11-14 MED ORDER — VANCOMYCIN HCL 1250 MG/250ML IV SOLN
1250.0000 mg | INTRAVENOUS | Status: DC
  Filled 2023-11-14: qty 250

## 2023-11-14 MED ORDER — POLYETHYLENE GLYCOL 3350 17 G PO PACK: 17.0000 g | PACK | Freq: Every day | ORAL | Status: DC | PRN

## 2023-11-14 NOTE — Progress Notes (Signed)
 Pharmacy Antibiotic Note  Kara Lee is a 78 y.o. female admitted on 11/14/2023 with possible urosepsis.  Pharmacy has been consulted for Vancomycin   dosing.  Plan: Vancomycin  1250 mg IV q24h  Height: 5' 2 (157.5 cm) Weight: 85.7 kg (189 lb) IBW/kg (Calculated) : 50.1  Temp (24hrs), Avg:97.5 F (36.4 C), Min:97.5 F (36.4 C), Max:97.5 F (36.4 C)  Recent Labs  Lab 11/14/23 0326 11/14/23 0348 11/14/23 0540  WBC 12.0*  --   --   CREATININE 0.71  --   --   LATICACIDVEN  --  3.3* 2.6*    Estimated Creatinine Clearance: 61.7 mL/min (by C-G formula based on SCr of 0.71 mg/dL).    Allergies  Allergen Reactions   Bee Venom Itching and Swelling   Shellfish Allergy Itching   Sulfa Antibiotics Nausea And Vomiting   Cipro  [Ciprofloxacin  Hcl] Rash   Glimepiride Rash   Latex Itching and Swelling   Lovenox [Enoxaparin Sodium] Other (See Comments)    Bruising   Metronidazole  Rash   Minocycline Nausea And Vomiting   Morphine  And Codeine Nausea And Vomiting   Nickel Other (See Comments)    Contact dermatitis    Statins Other (See Comments)    Muscle aches   Tape Itching    Bandaids and adhesive tape    Kara Lee 11/14/2023 6:54 AM

## 2023-11-14 NOTE — Progress Notes (Signed)
 PHARMACY - ANTICOAGULATION CONSULT NOTE  Pharmacy Consult for Heparin  Indication: atrial fibrillation  Allergies  Allergen Reactions   Bee Venom Itching and Swelling   Shellfish Allergy Itching   Sulfa Antibiotics Nausea And Vomiting   Cipro  [Ciprofloxacin  Hcl] Rash   Glimepiride Rash   Latex Itching and Swelling   Lovenox [Enoxaparin Sodium] Other (See Comments)    Bruising   Metronidazole  Rash   Minocycline Nausea And Vomiting   Morphine  And Codeine Nausea And Vomiting   Nickel Other (See Comments)    Contact dermatitis    Statins Other (See Comments)    Muscle aches   Tape Itching    Bandaids and adhesive tape    Patient Measurements: Height: 5' 2 (157.5 cm) Weight: 85.7 kg (189 lb) IBW/kg (Calculated) : 50.1 HEPARIN  DW (KG): 69.6  Vital Signs: Temp: 97.5 F (36.4 C) (07/28 0354) Temp Source: Oral (07/28 0354) BP: 97/62 (07/28 0620) Pulse Rate: 60 (07/28 0620)  Labs: Recent Labs    11/14/23 0326 11/14/23 0512  HGB 11.6*  --   HCT 36.7  --   PLT 145*  --   LABPROT 15.2  --   INR 1.1  --   CREATININE 0.71  --   TROPONINIHS 6 212*    Estimated Creatinine Clearance: 61.7 mL/min (by C-G formula based on SCr of 0.71 mg/dL).   Medical History: Past Medical History:  Diagnosis Date   Anxiety    Cardiac arrhythmia    CHF (congestive heart failure) (HCC)    Colon polyp    Complication of anesthesia    Concussion 11/09/2012   From a fall at home on deck   Depression    postpartum depression after 2nd child   Diabetes mellitus (HCC)    Diverticulosis    Fatty liver    GERD (gastroesophageal reflux disease)    HLD (hyperlipidemia)    Hypertension    Morbid obesity (HCC)    OSA on CPAP    uses CPAP nightly   Osteoarthritis of right knee 07/31/2013   PONV (postoperative nausea and vomiting)    Rosacea    Severe aortic stenosis    Spinal stenosis     Medications:  No current facility-administered medications on file prior to encounter.    Current Outpatient Medications on File Prior to Encounter  Medication Sig Dispense Refill   apixaban  (ELIQUIS ) 5 MG TABS tablet Take 1 tablet (5 mg total) by mouth 2 (two) times daily. 180 tablet 1   atenolol  (TENORMIN ) 25 MG tablet Take 1 tablet (25 mg total) by mouth 2 (two) times daily. 180 tablet 3   cetirizine (ZYRTEC) 10 MG tablet Take 10 mg by mouth in the morning.     diltiazem  (CARDIZEM ) 30 MG tablet Take 1 tablet (30 mg total) by mouth every 4 (four) hours as needed for heart rate > 100 and systolic blood pressure > 100. 45 tablet 1   DULoxetine  (CYMBALTA ) 60 MG capsule Take 1 capsule (60 mg total) by mouth daily. 180 capsule 1   EPINEPHrine  (EPIPEN  2-PAK) 0.3 mg/0.3 mL IJ SOAJ injection Inject 0.3 mg into the muscle as needed for anaphylaxis. 2 each 1   insulin  regular (NOVOLIN R) 100 units/mL injection Inject into the skin daily per sliding scale. Max 40 units a day. 10 mL 11   losartan  (COZAAR ) 25 MG tablet Take 1 tablet (25 mg total) by mouth daily. 90 tablet 2   metFORMIN  (GLUCOPHAGE -XR) 500 MG 24 hr tablet Take 1 tablet (500 mg  total) by mouth 2 (two) times daily with a meal. 180 tablet 3   methocarbamol  (ROBAXIN ) 500 MG tablet Take 1 tablet (500 mg total) by mouth every 6 (six) to 8 (eight) hours as needed. 35 tablet 1   mometasone  (NASONEX ) 50 MCG/ACT nasal spray Place 2 sprays into the nose daily. 17 g 5   omeprazole  (PRILOSEC) 40 MG capsule Take 1 capsule (40 mg total) by mouth daily. 90 capsule 2   Pitavastatin  Magnesium  (ZYPITAMAG ) 4 MG TABS Take 1 tablet (4 mg total) by mouth daily. 90 tablet 2   SYSTANE COMPLETE PF 0.6 % SOLN Place 1 drop into both eyes See admin instructions. Instill 1 drop into both eyes two to three times a day     tirzepatide  (MOUNJARO ) 5 MG/0.5ML Pen Inject 5 mg into the skin once a week. 2 mL 3   triamcinolone  cream in Minerin Creme Apply topically 2 (two) times daily as needed for itching. 454 g 0   [DISCONTINUED] amoxicillin  (AMOXIL ) 500 MG  capsule Take 4 capsules by mouth 1 hour before treatment (Patient not taking: Reported on 11/14/2023) 8 capsule 0   [DISCONTINUED] Blood Glucose Monitoring Suppl DEVI 1 each by Does not apply route in the morning, at noon, and at bedtime. May substitute to any manufacturer covered by patient's insurance. 1 each 0   [DISCONTINUED] methylPREDNISolone  (MEDROL ) 4 MG TBPK tablet Take as directed on package over 6 days. Take 6 tablets by mouth on day 1, 5 tablets on day 2, 4 tablets on day 3, 3 tablets on day 4, 2 tablets on day 5, and 1 tablet on day 6. (Patient not taking: Reported on 11/14/2023) 21 tablet 0   [DISCONTINUED] TYLENOL  8 HOUR 650 MG CR tablet Take 1,300 mg by mouth every 8 (eight) hours as needed for pain.       Assessment: 78 y.o. female admitted with SVT, h/o Afib and Eliquis  on hold, for heparin .  Last dose of Eliquis  given 7/27 at 6 pm Goal of Therapy:  aPTT 66-102 sec Heparin  level 0.3-0.7 units/ml Monitor platelets by anticoagulation protocol: Yes   Plan:  Start heparin  1000 units/hr Check aPTT in 8 hours   Tino Ronan, Cordella Misty 11/14/2023,6:32 AM

## 2023-11-14 NOTE — Consult Note (Addendum)
 ELECTROPHYSIOLOGY CONSULT NOTE    Patient ID: Kara Lee MRN: [id number], DOB/AGE: 12/01/1945 78 y.o.  Admit date: 11/14/2023 Date of Consult: 11/14/2023  Primary Physician: Frann Mabel Mt, DO Primary Cardiologist: Gordy Bergamo, MD  Electrophysiologist: Dr. Kennyth   Referring Provider: @ATTENDING @  Patient Profile: Kara Lee is a 78 y.o. 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 Wonda), obesity, GERD, OSA on CPAP  who is being seen today for the evaluation of SVT vs Aflutter at the request of Dr. Delford.  HPI:  Kara Lee is a 78 y.o. female with PMH as above. She is well-known to EP team and was scheduled for AFib ablation w EPS this week that she cancelled. She has many year history of palpitation episodes that almost always are relieved with vagal maneuvers. She has never used her PRN diltiazem . The patient remembers 1 time that she could not stop episodes.   She was getting ready for bed last night and had acute onset flushing followed by palpitations that were much more pronounced than her usual palps. Apple watch read pulse as 150s. She tried her usual vagal maneuvers that did not break the rhythm and she then called EMS.   On EMS arrival, she was hypotensive (sys BP 80s) with HR in 150, given IVF In ER, BP remained borderline 90/50.   Labs in ER notable for WBC 12, lactate to 3.3. resp viral panel negative, blood culture pending. UA w WBC but no bacteria. She continued to have hypotension in ER, started on low-dose levophed , and so is is admitted to ICU.   Since being in hospital, she has had no episodes of her palpitations and feels well. She requests to rescheduled AF ablation to ASAP with Dr. Kennyth.  She denies chest pain, chest pressure, no missed doses of atenolol . No edema.  Of note, she completed a steroid dosepack about 2 weeks ago for shoulder problems, rx'd by ortho.     Labs Potassium3.6 (07/28 0326)    Creatinine, ser  0.71 (07/28 0326) PLT  145* (07/28 0326) HGB  11.6* (07/28 0326) WBC 12.0* (07/28 0326) Troponin I (High Sensitivity)212* (07/28 9487).    Past Medical History:  Diagnosis Date   Anxiety    Cardiac arrhythmia    CHF (congestive heart failure) (HCC)    Colon polyp    Complication of anesthesia    Concussion 11/09/2012   From a fall at home on deck   Depression    postpartum depression after 2nd child   Diabetes mellitus (HCC)    Diverticulosis    Fatty liver    GERD (gastroesophageal reflux disease)    HLD (hyperlipidemia)    Hypertension    Morbid obesity (HCC)    OSA on CPAP    uses CPAP nightly   Osteoarthritis of right knee 07/31/2013   PONV (postoperative nausea and vomiting)    Rosacea    Severe aortic stenosis    Spinal stenosis      Surgical History:  Past Surgical History:  Procedure Laterality Date   ABDOMINAL HYSTERECTOMY     still has uterus   ANKLE RECONSTRUCTION Left    ARTERY BIOPSY Left 01/21/2020   Procedure: BIOPSY TEMPORAL ARTERY;  Surgeon: Karis Clunes, MD;  Location: [facility];  Service: ENT;  Laterality: Left;   BIOPSY BREAST Bilateral    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   CESAREAN SECTION  x 2   CHOLECYSTECTOMY     COLONOSCOPY  04/21/2016   Single small external hemorrhoid, small internal hemorrhoids, mild sigmoid diverticulosis, 8 mm rectal polyp removed with snare   EYE SURGERY     lasik; X3 for crossed eyes; 1 eyelid surgery   INCISIONAL HERNIA REPAIR     PARTIAL KNEE ARTHROPLASTY Right 07/31/2013   Procedure: RIGHT UNICOMPARTMENTAL KNEE;  Surgeon: Fonda SHAUNNA Olmsted, MD;  Location: [facility] OR;  Service: Orthopedics;  Laterality: Right;   RIGHT/LEFT HEART CATH AND CORONARY ANGIOGRAPHY N/A 10/04/2017   Procedure: RIGHT/LEFT HEART CATH AND CORONARY ANGIOGRAPHY;  Surgeon: Ladona Heinz, MD;  Location: [facility] INVASIVE CV LAB;  Service: Cardiovascular;  Laterality: N/A;   TEE WITHOUT CARDIOVERSION N/A 08/31/2017    Procedure: TRANSESOPHAGEAL ECHOCARDIOGRAM (TEE);  Surgeon: Ladona Heinz, MD;  Location: [facility] ENDOSCOPY;  Service: Cardiovascular;  Laterality: N/A;   TEE WITHOUT CARDIOVERSION N/A 01/30/2019   Procedure: TRANSESOPHAGEAL ECHOCARDIOGRAM (TEE);  Surgeon: Wonda Sharper, MD;  Location: [facility] INVASIVE CV LAB;  Service: Open Heart Surgery;  Laterality: N/A;   TRANSCATHETER AORTIC VALVE REPLACEMENT, TRANSFEMORAL N/A 01/30/2019   Procedure: TRANSCATHETER AORTIC VALVE REPLACEMENT, TRANSFEMORAL;  Surgeon: Wonda Sharper, MD;  Location: [facility] INVASIVE CV LAB;  Service: Open Heart Surgery;  Laterality: N/A;   TUBAL LIGATION       Medications Prior to Admission  Medication Sig Dispense Refill Last Dose/Taking   acetaminophen  (TYLENOL ) 650 MG CR tablet Take 650 mg by mouth every 8 (eight) hours as needed for pain.   11/12/2023   apixaban  (ELIQUIS ) 5 MG TABS tablet Take 1 tablet (5 mg total) by mouth 2 (two) times daily. 180 tablet 1 11/13/2023 at  6:00 PM   atenolol  (TENORMIN ) 25 MG tablet Take 1 tablet (25 mg total) by mouth 2 (two) times daily. 180 tablet 3 11/13/2023 at  6:00 PM   cetirizine (ZYRTEC) 10 MG tablet Take 10 mg by mouth in the morning.   11/13/2023 Morning   docusate sodium  (COLACE) 100 MG capsule Take 100 mg by mouth every morning.   11/13/2023 Morning   DULoxetine  (CYMBALTA ) 60 MG capsule Take 1 capsule (60 mg total) by mouth daily. 180 capsule 1 11/13/2023 Morning   EPINEPHrine  (EPIPEN  2-PAK) 0.3 mg/0.3 mL IJ SOAJ injection Inject 0.3 mg into the muscle as needed for anaphylaxis. 2 each 1 Unknown   insulin  regular (NOVOLIN R) 100 units/mL injection Inject into the skin daily per sliding scale. Max 40 units a day. (Patient taking differently: Inject 10 Units into the skin at bedtime.) 10 mL 11 11/13/2023 Bedtime   losartan  (COZAAR ) 25 MG tablet Take 1 tablet (25 mg total) by mouth daily. 90 tablet 2 11/13/2023 Morning   metFORMIN  (GLUCOPHAGE -XR) 500 MG 24 hr tablet Take 1 tablet (500 mg total) by mouth 2  (two) times daily with a meal. 180 tablet 3 11/13/2023 at  6:00 PM   methocarbamol  (ROBAXIN ) 500 MG tablet Take 1 tablet (500 mg total) by mouth every 6 (six) to 8 (eight) hours as needed. 35 tablet 1 Past Week   mometasone  (NASONEX ) 50 MCG/ACT nasal spray Place 2 sprays into the nose daily. (Patient taking differently: Place 2 sprays into the nose daily as needed (Sinus allergies).) 17 g 5 Past Week   omeprazole  (PRILOSEC) 40 MG capsule Take 1 capsule (40 mg total) by mouth daily. 90 capsule 2 11/13/2023 Morning   Pitavastatin  Magnesium  (ZYPITAMAG ) 4 MG TABS Take 1 tablet (4 mg total) by mouth daily. (Patient taking differently: Take 1 tablet by  mouth every evening.) 90 tablet 2 11/13/2023 Evening   SYSTANE COMPLETE PF 0.6 % SOLN Place 1 drop into both eyes in the morning and at bedtime. Instill 1 drop into both eyes two times a day   11/13/2023 Morning   tirzepatide  (MOUNJARO ) 5 MG/0.5ML Pen Inject 5 mg into the skin once a week. 2 mL 3 11/11/2023    Inpatient Medications:   Chlorhexidine  Gluconate Cloth  6 each Topical Daily   hydrocortisone  sod succinate (SOLU-CORTEF ) inj  100 mg Intravenous Q8H   insulin  aspart  0-20 Units Subcutaneous Q4H    Allergies:  Allergies  Allergen Reactions   Bee Venom Itching and Swelling   Shellfish Allergy Itching   Sulfa Antibiotics Nausea And Vomiting   Cipro  [Ciprofloxacin  Hcl] Rash   Glimepiride Rash   Latex Itching and Swelling   Lovenox [Enoxaparin Sodium] Other (See Comments)    Bruising   Metronidazole  Rash   Minocycline Nausea And Vomiting   Morphine  And Codeine Nausea And Vomiting   Nickel Other (See Comments)    Contact dermatitis    Statins Other (See Comments)    Muscle aches   Tape Itching    Bandaids and adhesive tape    Family History  Problem Relation Age of Onset   Pancreatitis Mother    COPD Mother    Diabetes type II Mother    Cirrhosis Sister        NASH   Allergic rhinitis Maternal Grandmother    Breast cancer Maternal  Grandmother    Cataracts Maternal Grandfather    Prostate cancer Maternal Grandfather    Heart disease Maternal Grandfather    Asthma Son    Kidney Stones Daughter    Immunodeficiency Neg Hx    Angioedema Neg Hx    Eczema Neg Hx    Urticaria Neg Hx      Physical Exam: Vitals:   11/14/23 0715 11/14/23 0730 11/14/23 0745 11/14/23 0800  BP: (!) 97/56 (!) 102/48 94/60 117/88  Pulse: (!) 59 (!) 55 61 (!) 59  Resp: 16 16 20 12   Temp:      TempSrc:      SpO2: 93% 96% 92% 95%  Weight:      Height:        GEN- NAD, A&O x 3, normal affect HEENT: Normocephalic, atraumatic Lungs- CTAB, Normal effort.  Heart- Regular rate and rhythm, No M/G/R.  GI- Soft, NT, ND.  Extremities- No clubbing, cyanosis, or edema   Radiology/Studies: CT CHEST ABDOMEN PELVIS W CONTRAST Result Date: 11/14/2023 EXAM: CT CHEST, ABDOMEN AND PELVIS WITH CONTRAST 11/14/2023 05:05:55 AM TECHNIQUE: CT of the chest, abdomen and pelvis was performed with the administration of intravenous contrast. Multiplanar reformatted images are provided for review. Automated exposure control, iterative reconstruction, and/or weight based adjustment of the mA/kV was utilized to reduce the radiation dose to as low as reasonably achievable. COMPARISON: CT angio chest 01/17/2019 and CT abdomen and pelvis 04/17/2020. CLINICAL HISTORY: Sepsis. Patient arrives via [facility] from home for hypotension and tachycardia. EMS found patient with SBP in the 80's and heart rate in the 150's. History of afib - EKG showed sinus tach. Endorses increased frequency of urination. 2L NS given PTA. FINDINGS: CHEST: MEDIASTINUM: Heart size is upper limits of normal. Mitral valve calcification. Status post TAVR. Increased caliber of the main pulmonary artery measures 3.5 cm. THORACIC LYMPH NODES: No mediastinal, hilar or axillary lymphadenopathy. LUNGS AND PLEURA: Within the paravertebral right lung base there is a 6 mm lung nodule,  image axial image 146. New compared  with the previous exam. No pleural effusion or pneumothorax. ABDOMEN AND PELVIS: LIVER: The liver is unremarkable. GALLBLADDER AND BILE DUCTS: Status post cholecystectomy. SPLEEN: No acute abnormality. PANCREAS: Scattered parenchymal calcifications identified within the head, body and tail of pancreas. ADRENAL GLANDS: No acute abnormality. KIDNEYS, URETERS AND BLADDER: Bilateral Bosniak class 1 and 2 kidney cysts are noted. The largest arises off the posterior medial cortex of the interpolar right kidney measuring 2.6 cm, axial image 73. No follow up imaging recommended. No stones in the kidneys or ureters. No hydronephrosis. No perinephric or periureteral stranding. Urinary bladder is unremarkable. GI AND BOWEL: The appendix is not confidently identified. No secondary signs of acute appendicitis. Mild diffuse stool burden in the colon. Sigmoid diverticulosis without signs of acute diverticulitis. There is no bowel obstruction. REPRODUCTIVE ORGANS: No acute abnormality. PERITONEUM AND RETROPERITONEUM: No ascites. No free air. VASCULATURE: Aortic atherosclerosis. Duplication of the IVC. ABDOMINAL AND PELVIS LYMPH NODES: No lymphadenopathy. BONES AND SOFT TISSUES: Lumbar degenerative disc disease with facet arthropathy. No acute osseous abnormality. No focal soft tissue abnormality. IMPRESSION: 1. No acute findings in the chest, abdomen, and pelvis related to sepsis. 2. New 6 mm right lung base nodule compared with the previous exam. Follow up CT of the chest at 6 to 8 months is recommended. If the patient is at increased risk an additional follow up exam at 18 to 24 months would be advised Electronically signed by: Waddell Calk MD 11/14/2023 05:28 AM EDT RP Workstation: [id number]   DG Chest Port 1 View Result Date: 11/14/2023 CLINICAL DATA:  Possible sepsis EXAM: PORTABLE CHEST 1 VIEW COMPARISON:  12/20/2022 FINDINGS: Cardiac shadow is stable. Changes of prior TAVR are seen. Lungs are well aerated bilaterally.  No focal infiltrate or effusion is seen. No bony abnormality is noted. IMPRESSION: No acute abnormality seen. Electronically Signed   By: Oneil Devonshire M.D.   On: 11/14/2023 03:34    EKG:SR with sinus arrhythmia, rate 66  (personally reviewed)   EMS initial run sheet - appears SVT   TELEMETRY: SR, intermittent ST rates 60-100s (personally reviewed)  DEVICE HISTORY: none  Assessment/Plan: #) parox AFib #) SVT #) hypotension #) elevated lactate   Patient has long history of palpitations that have always broke with vagal maneuvers. She was scheduled for AF ablation w EPS to eval for SVT, but cancelled this. She was recently on steroid dosepak, completed 2 weeks ago and then had steroid injection.   She presented to [facility] ER via EMS with SVT that did not break with her usual vagal maneuvers. She was hypotensive on arrival with elevated lactate, started on levophed .  When appropriate, restart atenolol .  Do not favor starting amio or multaq at this time.   Infectious workup continues Mgmt per CCM    EP will continue to follow       For questions or updates, please contact [facility] Please consult [email or web address] for contact info under Cardiology/STEMI.  Signed, Amarii Bordas, NP  11/14/2023 10:08 AM

## 2023-11-14 NOTE — Progress Notes (Signed)
 PT did not tolerate the CPAP. Stated that the mask was too uncomfortable for her. Sats are high 90's. CPAP left in the room.

## 2023-11-14 NOTE — ED Triage Notes (Signed)
 Patient arrives via [facility] from home for hypotension and tachycardia. EMS found patient with SBP in the 80's and heart rate in the 150's. History of afib - EKG showed sinus tach. Endorses increased frequency of urination. 2L NS given PTA.

## 2023-11-14 NOTE — Interval H&P Note (Signed)
 This notes serves as history and physical

## 2023-11-14 NOTE — Progress Notes (Signed)
 Pt being followed by [facility] for Sepsis protocol.

## 2023-11-14 NOTE — Consult Note (Signed)
 NAMESAHANA Lee, MRN:  [id number], DOB:  [birth year], LOS: 0 ADMISSION DATE:  11/14/2023, CONSULTATION DATE:  11/14/23 REFERRING MD:  Jerrol CHIEF COMPLAINT:  A.fib   History of Present Illness:  Kara Lee is a 78 y.o. female who has a PMH as outlined below including but limited to paroxysmal AF on apixaban , severe AS status post TAVR 01/30/2019, DM 2, dCHF (echo from Dec 2024 with EF 60-65% G2DD), obesity, GERD, OSA on CPAP.  She presented to [facility] ED on 7/28 due to ongoing palpitations.  She has PAF and was apparently scheduled for an ablation; however, had to cancel this due to needing to care for her husband at home.  Heart rate intermittently gets high but is tolerable (she often treats it on her own with maneuvers like Valsalva); however, tonight, it became unbearable to the point where she was having significant palpitations along with diaphoresis.  She on her Apple Watch and it noted that her heart rate was in the 150s.  She subsequently called EMS and upon their arrival, she was hypotensive with SBP in the 80s and heart rate in the 150s, read as sinus tach.  She received fluids en route and had conversion back to NSR.  She has had recent steroid use including a Dosepak for shoulder problems by orthopedics.  This had minimal relief and was followed up by an intra-articular steroid injection.  Has any recent fever/chills/sweats, headaches, chest pains, dyspnea, N/V/abdominal pain, myalgias, exposure to known sick contacts, dysuria.  UA does show some WBCs but no bacteria and no nitrites. Denies hx of frequent UTI's or recent abx use.  Unfortunately in the ED, she continued to have hypotension despite a total of 2.5 L of IV fluids.  She was subsequently started on low-dose Levophed .  PCCM was called for admission.  Pertinent  Medical History:  has Osteoarthritis of right knee; Severe aortic stenosis; OSA on CPAP; Morbid obesity (HCC); Diabetes mellitus (HCC); GERD (gastroesophageal  reflux disease); HLD (hyperlipidemia); S/P TAVR (transcatheter aortic valve replacement) Transcatheter Aortic Valve Replacement - Percutaneous Transfemoral Approach Edwards Sapien 3 Ultra THV (size 26 mm ) 01/30/2019; Type 2 diabetes mellitus with obesity (HCC); Proteinuria; Depression, major, single episode, moderate (HCC); Essential hypertension; GAD (generalized anxiety disorder); Fibromyalgia; Polyp of colon; Chronic cough; Statin-induced myositis; Tenosynovitis, de Quervain; Trigger ring finger of left hand; Strain of left trapezius muscle; and Sepsis (HCC) on their problem list.  Significant Hospital Events: Including procedures, antibiotic start and stop dates in addition to other pertinent events   7/28 admit.  Interim History / Subjective:  Feels better now, currently in NSR.  On 2mcg/min NE with MAP 78.  Objective:  Blood pressure (!) 123/59, pulse (!) 51, temperature (!) 97.5 F (36.4 C), temperature source Oral, resp. rate 13, height 5' 2 (1.575 m), weight 85.7 kg, SpO2 95%.        Intake/Output Summary (Last 24 hours) at 11/14/2023 0644 Last data filed at 11/14/2023 9365 Gross per 24 hour  Intake 900 ml  Output --  Net 900 ml   Filed Weights   11/14/23 0318  Weight: 85.7 kg     Physical Exam: General: Adult female, resting in bed, in NAD. Neuro: A&O x 3, no deficits. HEENT: /AT. Sclerae anicteric. EOMI. MM dry. Cardiovascular: RRR, no M/R/G.  Lungs: Respirations even and unlabored.  CTA bilaterally, No W/R/R. Abdomen: BS x 4, soft, NT/ND.  Musculoskeletal: No gross deformities, no edema.    Labs/imaging personally reviewed:  CT  chest/abd/pelv 7/28 >36mm RLL nodule.  Assessment & Plan:   Septic shock - possibly due to UTI (though UA not overly impressive, some WBCs but no bacteria or nitrites).  Also concern for possible relative adrenal insufficiency given recent steroid use. - Empiric Vanco/Zosyn  for now. - Follow-up on urine culture. - Add on cortisol  levels. - Continue low dose NE for goal MAP > 65. - Empiric stress dose steroids. - Continue fluids.  Hx PAF on Apixaban , AS s/p TAVR 2020, dCHF (echo from Dec 2024 with EF 60-65%, G2DD). - Heparin  gtt in lieu of PTA Apixaban  for now. - Low dose NE as above, if has increasing doses would switch to Phenylephrine  as to not exacerbate PAF with NE. - Hold PTA Atenolol , Losartan . - Was scheduled for Ablation this week apparently but had to cancel due to home needs caring for husband. - Cardiology following, appreciate the assistance. - Consider EP consult, ? Ablation while in patient. - Consider repeat echo (last Dec 2024 with EF 60-65%, G2DD).  Lactic acidosis - 2/2 above, also on Metformin  PTA. - Hold PTA Metformin . - Continue fluids.  Hx DM. - SSI. - Hold PTA Metformin , Tirzepatide .   Hx OSA on CPAP. - Continue PTA nocturnal CPAP.  Incidentally found 6mm RLL pulmonary nodule. No prior smoking hx. - F/u CT scan in 6 - 8 months.  Best practice (evaluated daily):  Diet/type: Regular consistency (see orders) DVT prophylaxis: systemic heparin  Pressure ulcer(s): pressure ulcer assessment deferred  GI prophylaxis: N/A Lines: N/A Foley:  N/A Code Status:  full code Last date of multidisciplinary goals of care discussion: None yet.  Labs   CBC: Recent Labs  Lab 11/14/23 0326  WBC 12.0*  NEUTROABS 7.5  HGB 11.6*  HCT 36.7  MCV 94.6  PLT 145*    Basic Metabolic Panel: Recent Labs  Lab 11/14/23 0326  NA 138  K 3.6  CL 110  CO2 15*  GLUCOSE 134*  BUN 16  CREATININE 0.71  CALCIUM 8.6*   GFR: Estimated Creatinine Clearance: 61.7 mL/min (by C-G formula based on SCr of 0.71 mg/dL). Recent Labs  Lab 11/14/23 0326 11/14/23 0348 11/14/23 0540  WBC 12.0*  --   --   LATICACIDVEN  --  3.3* 2.6*    Liver Function Tests: Recent Labs  Lab 11/14/23 0326  AST 20  ALT 16  ALKPHOS 56  BILITOT 0.6  PROT 5.9*  ALBUMIN  3.0*   No results for input(s): LIPASE,  AMYLASE in the last 168 hours. No results for input(s): AMMONIA in the last 168 hours.  ABG    Component Value Date/Time   PHART 7.451 (H) 01/26/2019 0952   PCO2ART 30.4 (L) 01/26/2019 0952   PO2ART 51.1 (L) 01/26/2019 0952   HCO3 20.9 01/26/2019 0952   TCO2 21 (L) 01/30/2019 1219   ACIDBASEDEF 2.5 (H) 01/26/2019 0952   O2SAT 87.6 01/26/2019 0952     Coagulation Profile: Recent Labs  Lab 11/14/23 0326  INR 1.1    Cardiac Enzymes: No results for input(s): CKTOTAL, CKMB, CKMBINDEX, TROPONINI in the last 168 hours.  HbA1C: Hgb A1c MFr Bld  Date/Time Value Ref Range Status  07/18/2023 04:34 PM 7.8 (H) 4.6 - 6.5 % Final    Comment:    Glycemic Control Guidelines for People with Diabetes:Non Diabetic:  <6%Goal of Therapy: <7%Additional Action Suggested:  >8%   02/04/2023 02:59 PM 8.0 (H) 4.8 - 5.6 % Final    Comment:  Prediabetes: 5.7 - 6.4          Diabetes: >6.4          Glycemic control for adults with diabetes: <7.0     CBG: No results for input(s): GLUCAP in the last 168 hours.  Review of Systems:   All negative; except for those that are bolded, which indicate positives.  Constitutional: weight loss, weight gain, night sweats, fevers, chills, fatigue, weakness.  HEENT: headaches, sore throat, sneezing, nasal congestion, post nasal drip, difficulty swallowing, tooth/dental problems, visual complaints, visual changes, ear aches. Neuro: difficulty with speech, weakness, numbness, ataxia. CV:  chest pain, orthopnea, PND, swelling in lower extremities, dizziness, palpitations, syncope.  Resp: cough, hemoptysis, dyspnea, wheezing. GI: heartburn, indigestion, abdominal pain, nausea, vomiting, diarrhea, constipation, change in bowel habits, loss of appetite, hematemesis, melena, hematochezia.  GU: dysuria, change in color of urine, urgency or frequency, flank pain, hematuria. MSK: joint pain or swelling, decreased range of motion. Psych: change in  mood or affect, depression, anxiety, suicidal ideations, homicidal ideations. Skin: rash, itching, bruising.   Past Medical History:  She,  has a past medical history of Anxiety, Cardiac arrhythmia, CHF (congestive heart failure) (HCC), Colon polyp, Complication of anesthesia, Concussion (11/09/2012), Depression, Diabetes mellitus (HCC), Diverticulosis, Fatty liver, GERD (gastroesophageal reflux disease), HLD (hyperlipidemia), Hypertension, Morbid obesity (HCC), OSA on CPAP, Osteoarthritis of right knee (07/31/2013), PONV (postoperative nausea and vomiting), Rosacea, Severe aortic stenosis, and Spinal stenosis.   Surgical History:   Past Surgical History:  Procedure Laterality Date   ABDOMINAL HYSTERECTOMY     still has uterus   ANKLE RECONSTRUCTION Left    ARTERY BIOPSY Left 01/21/2020   Procedure: BIOPSY TEMPORAL ARTERY;  Surgeon: Karis Clunes, MD;  Location: [facility];  Service: ENT;  Laterality: Left;   BIOPSY BREAST Bilateral    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   CESAREAN SECTION     x 2   CHOLECYSTECTOMY     COLONOSCOPY  04/21/2016   Single small external hemorrhoid, small internal hemorrhoids, mild sigmoid diverticulosis, 8 mm rectal polyp removed with snare   EYE SURGERY     lasik; X3 for crossed eyes; 1 eyelid surgery   INCISIONAL HERNIA REPAIR     PARTIAL KNEE ARTHROPLASTY Right 07/31/2013   Procedure: RIGHT UNICOMPARTMENTAL KNEE;  Surgeon: Fonda SHAUNNA Olmsted, MD;  Location: [facility] OR;  Service: Orthopedics;  Laterality: Right;   RIGHT/LEFT HEART CATH AND CORONARY ANGIOGRAPHY N/A 10/04/2017   Procedure: RIGHT/LEFT HEART CATH AND CORONARY ANGIOGRAPHY;  Surgeon: Ladona Heinz, MD;  Location: [facility] INVASIVE CV LAB;  Service: Cardiovascular;  Laterality: N/A;   TEE WITHOUT CARDIOVERSION N/A 08/31/2017   Procedure: TRANSESOPHAGEAL ECHOCARDIOGRAM (TEE);  Surgeon: Ladona Heinz, MD;  Location: [facility] ENDOSCOPY;  Service: Cardiovascular;  Laterality: N/A;   TEE WITHOUT  CARDIOVERSION N/A 01/30/2019   Procedure: TRANSESOPHAGEAL ECHOCARDIOGRAM (TEE);  Surgeon: Wonda Sharper, MD;  Location: [facility] INVASIVE CV LAB;  Service: Open Heart Surgery;  Laterality: N/A;   TRANSCATHETER AORTIC VALVE REPLACEMENT, TRANSFEMORAL N/A 01/30/2019   Procedure: TRANSCATHETER AORTIC VALVE REPLACEMENT, TRANSFEMORAL;  Surgeon: Wonda Sharper, MD;  Location: [facility] INVASIVE CV LAB;  Service: Open Heart Surgery;  Laterality: N/A;   TUBAL LIGATION       Social History:   reports that she has never smoked. She has never used smokeless tobacco. She reports that she does not currently use alcohol. She reports that she does not use drugs.   Family History:  Her family history includes Allergic rhinitis  in her maternal grandmother; Asthma in her son; Breast cancer in her maternal grandmother; COPD in her mother; Cataracts in her maternal grandfather; Cirrhosis in her sister; Diabetes type II in her mother; Heart disease in her maternal grandfather; Kidney Stones in her daughter; Pancreatitis in her mother; Prostate cancer in her maternal grandfather. There is no history of Immunodeficiency, Angioedema, Eczema, or Urticaria.   Allergies Allergies  Allergen Reactions   Bee Venom Itching and Swelling   Shellfish Allergy Itching   Sulfa Antibiotics Nausea And Vomiting   Cipro  [Ciprofloxacin  Hcl] Rash   Glimepiride Rash   Latex Itching and Swelling   Lovenox [Enoxaparin Sodium] Other (See Comments)    Bruising   Metronidazole  Rash   Minocycline Nausea And Vomiting   Morphine  And Codeine Nausea And Vomiting   Nickel Other (See Comments)    Contact dermatitis    Statins Other (See Comments)    Muscle aches   Tape Itching    Bandaids and adhesive tape     Home Medications  Prior to Admission medications   Medication Sig Start Date End Date Taking? Authorizing Provider  apixaban  (ELIQUIS ) 5 MG TABS tablet Take 1 tablet (5 mg total) by mouth 2 (two) times daily. 07/18/23 01/14/24  Frann Mabel Mt, DO  atenolol  (TENORMIN ) 25 MG tablet Take 1 tablet (25 mg total) by mouth 2 (two) times daily. 01/05/23   Ladona Heinz, MD  cetirizine (ZYRTEC) 10 MG tablet Take 10 mg by mouth in the morning.    [provider]  diltiazem  (CARDIZEM ) 30 MG tablet Take 1 tablet (30 mg total) by mouth every 4 (four) hours as needed for heart rate > 100 and systolic blood pressure > 100. 08/22/23   Fenton, Clint R, PA  DULoxetine  (CYMBALTA ) 60 MG capsule Take 1 capsule (60 mg total) by mouth daily. 08/15/23   Frann Mabel Mt, DO  EPINEPHrine  (EPIPEN  2-PAK) 0.3 mg/0.3 mL IJ SOAJ injection Inject 0.3 mg into the muscle as needed for anaphylaxis. 03/15/22   Marinda Rocky SAILOR, MD  insulin  regular (NOVOLIN R) 100 units/mL injection Inject into the skin daily per sliding scale. Max 40 units a day. 06/03/23   Antonio Cyndee Jamee JONELLE, DO  losartan  (COZAAR ) 25 MG tablet Take 1 tablet (25 mg total) by mouth daily. 04/11/23   Frann Mabel Mt, DO  metFORMIN  (GLUCOPHAGE -XR) 500 MG 24 hr tablet Take 1 tablet (500 mg total) by mouth 2 (two) times daily with a meal. 02/14/23   Wendling, Mabel Mt, DO  methocarbamol  (ROBAXIN ) 500 MG tablet Take 1 tablet (500 mg total) by mouth every 6 (six) to 8 (eight) hours as needed. 04/11/23   Frann Mabel Mt, DO  mometasone  (NASONEX ) 50 MCG/ACT nasal spray Place 2 sprays into the nose daily. 10/07/23   Frann Mabel Mt, DO  omeprazole  (PRILOSEC) 40 MG capsule Take 1 capsule (40 mg total) by mouth daily. 02/04/23   Frann Mabel Mt, DO  Pitavastatin  Magnesium  (ZYPITAMAG ) 4 MG TABS Take 1 tablet (4 mg total) by mouth daily. 04/26/23   Ladona Heinz, MD  SYSTANE COMPLETE PF 0.6 % SOLN Place 1 drop into both eyes See admin instructions. Instill 1 drop into both eyes two to three times a day    [provider]  tirzepatide  (MOUNJARO ) 5 MG/0.5ML Pen Inject 5 mg into the skin once a week. 10/18/23 02/07/24  Frann Mabel Mt, DO  triamcinolone   cream in Minerin Creme Apply topically 2 (two) times daily as needed for itching. 07/18/23  Frann Mabel Mt, DO  TYLENOL  8 HOUR 650 MG CR tablet Take 1,300 mg by mouth every 8 (eight) hours as needed for pain.    [provider]     Critical care time: 35 min.   Sammi Gore, PA - [facility] For pager details, please see AMION or use Epic chat  After 1900, please call [facility] for cross coverage needs 11/14/2023, 6:44 AM

## 2023-11-14 NOTE — Progress Notes (Signed)
 PHARMACY - ANTICOAGULATION CONSULT NOTE  Pharmacy Consult for Heparin  Indication: atrial fibrillation  Allergies  Allergen Reactions   Bee Venom Itching and Swelling   Shellfish Allergy Itching   Sulfa Antibiotics Nausea And Vomiting   Cipro  [Ciprofloxacin  Hcl] Rash   Glimepiride Rash   Latex Itching and Swelling   Lovenox [Enoxaparin Sodium] Other (See Comments)    Bruising   Metronidazole  Rash   Minocycline Nausea And Vomiting   Morphine  And Codeine Nausea And Vomiting   Nickel Other (See Comments)    Contact dermatitis    Statins Other (See Comments)    Muscle aches   Tape Itching    Bandaids and adhesive tape    Patient Measurements: Height: 5' 2 (157.5 cm) Weight: 85.7 kg (189 lb) IBW/kg (Calculated) : 50.1 HEPARIN  DW (KG): 69.6  Vital Signs: Temp: 98.1 F (36.7 C) (07/28 1600) Temp Source: Oral (07/28 1600) BP: 106/51 (07/28 1400) Pulse Rate: 71 (07/28 1400)  Labs: Recent Labs    11/14/23 0326 11/14/23 0512 11/14/23 1608  HGB 11.6*  --   --   HCT 36.7  --   --   PLT 145*  --   --   APTT  --   --  45*  LABPROT 15.2  --   --   INR 1.1  --   --   CREATININE 0.71  --   --   TROPONINIHS 6 212*  --     Estimated Creatinine Clearance: 61.7 mL/min (by C-G formula based on SCr of 0.71 mg/dL).   Medical History: Past Medical History:  Diagnosis Date   Anxiety    Cardiac arrhythmia    CHF (congestive heart failure) (HCC)    Colon polyp    Complication of anesthesia    Concussion 11/09/2012   From a fall at home on deck   Depression    postpartum depression after 2nd child   Diabetes mellitus (HCC)    Diverticulosis    Fatty liver    GERD (gastroesophageal reflux disease)    HLD (hyperlipidemia)    Hypertension    Morbid obesity (HCC)    OSA on CPAP    uses CPAP nightly   Osteoarthritis of right knee 07/31/2013   PONV (postoperative nausea and vomiting)    Rosacea    Severe aortic stenosis    Spinal stenosis     Medications:  No  current facility-administered medications on file prior to encounter.   Current Outpatient Medications on File Prior to Encounter  Medication Sig Dispense Refill   acetaminophen  (TYLENOL ) 650 MG CR tablet Take 650 mg by mouth every 8 (eight) hours as needed for pain.     apixaban  (ELIQUIS ) 5 MG TABS tablet Take 1 tablet (5 mg total) by mouth 2 (two) times daily. 180 tablet 1   atenolol  (TENORMIN ) 25 MG tablet Take 1 tablet (25 mg total) by mouth 2 (two) times daily. 180 tablet 3   cetirizine (ZYRTEC) 10 MG tablet Take 10 mg by mouth in the morning.     docusate sodium  (COLACE) 100 MG capsule Take 100 mg by mouth every morning.     DULoxetine  (CYMBALTA ) 60 MG capsule Take 1 capsule (60 mg total) by mouth daily. 180 capsule 1   EPINEPHrine  (EPIPEN  2-PAK) 0.3 mg/0.3 mL IJ SOAJ injection Inject 0.3 mg into the muscle as needed for anaphylaxis. 2 each 1   insulin  regular (NOVOLIN R) 100 units/mL injection Inject into the skin daily per sliding scale. Max 40 units a  day. (Patient taking differently: Inject 10 Units into the skin at bedtime.) 10 mL 11   losartan  (COZAAR ) 25 MG tablet Take 1 tablet (25 mg total) by mouth daily. 90 tablet 2   metFORMIN  (GLUCOPHAGE -XR) 500 MG 24 hr tablet Take 1 tablet (500 mg total) by mouth 2 (two) times daily with a meal. 180 tablet 3   methocarbamol  (ROBAXIN ) 500 MG tablet Take 1 tablet (500 mg total) by mouth every 6 (six) to 8 (eight) hours as needed. 35 tablet 1   mometasone  (NASONEX ) 50 MCG/ACT nasal spray Place 2 sprays into the nose daily. (Patient taking differently: Place 2 sprays into the nose daily as needed (Sinus allergies).) 17 g 5   omeprazole  (PRILOSEC) 40 MG capsule Take 1 capsule (40 mg total) by mouth daily. 90 capsule 2   Pitavastatin  Magnesium  (ZYPITAMAG ) 4 MG TABS Take 1 tablet (4 mg total) by mouth daily. (Patient taking differently: Take 1 tablet by mouth every evening.) 90 tablet 2   SYSTANE COMPLETE PF 0.6 % SOLN Place 1 drop into both eyes in  the morning and at bedtime. Instill 1 drop into both eyes two times a day     tirzepatide  (MOUNJARO ) 5 MG/0.5ML Pen Inject 5 mg into the skin once a week. 2 mL 3   [DISCONTINUED] amoxicillin  (AMOXIL ) 500 MG capsule Take 4 capsules by mouth 1 hour before treatment (Patient not taking: Reported on 11/14/2023) 8 capsule 0   [DISCONTINUED] Blood Glucose Monitoring Suppl DEVI 1 each by Does not apply route in the morning, at noon, and at bedtime. May substitute to any manufacturer covered by patient's insurance. 1 each 0   [DISCONTINUED] diltiazem  (CARDIZEM ) 30 MG tablet Take 1 tablet (30 mg total) by mouth every 4 (four) hours as needed for heart rate > 100 and systolic blood pressure > 100. (Patient not taking: Reported on 11/14/2023) 45 tablet 1   [DISCONTINUED] methylPREDNISolone  (MEDROL ) 4 MG TBPK tablet Take as directed on package over 6 days. Take 6 tablets by mouth on day 1, 5 tablets on day 2, 4 tablets on day 3, 3 tablets on day 4, 2 tablets on day 5, and 1 tablet on day 6. (Patient not taking: Reported on 11/14/2023) 21 tablet 0   [DISCONTINUED] triamcinolone  cream in Minerin Creme Apply topically 2 (two) times daily as needed for itching. (Patient not taking: Reported on 11/14/2023) 454 g 0   [DISCONTINUED] TYLENOL  8 HOUR 650 MG CR tablet Take 1,300 mg by mouth every 8 (eight) hours as needed for pain. (Patient not taking: Reported on 11/14/2023)       Assessment: 78 y.o. female admitted with SVT, h/o Afib and Eliquis  on hold, for heparin .  Last dose of Eliquis  given 7/27 at 6 pm.  aPTT 45 is subtherapeutic with heparin  running at 1000 units/hr. Hgb (11.6) and PLTs (145) are stable. Patient renal function is stable. Per RN, no report of pauses, issues with the line, or signs of bleeding.    Goal of Therapy:  aPTT 66-102 sec Heparin  level 0.3-0.7 units/ml Monitor platelets by anticoagulation protocol: Yes   Plan:  Increase heparin  to 1250 units/hr Recheck aPTT after 8 hours to confirm  therapeutic Monitor daily aPTT, heparin  levels and CBC Monitor for any signs/symptoms of bleeding   Thank you for allowing pharmacy to be involved with this patient's care.  Mendel Barter, PharmD PGY1 Clinical Pharmacist [facility]  11/14/2023 5:01 PM

## 2023-11-14 NOTE — TOC CM/SW Note (Signed)
 Transition of Care [facility]) - Inpatient Brief Assessment   Patient Details  Name: Kara Lee MRN: [id number] Date of Birth: 1945-08-01  Transition of Care [facility]) CM/SW Contact:    Tom-Johnson, Harvest Muskrat, RN Phone Number: 11/14/2023, 2:00 PM   Clinical Narrative:  Patient presented to the ED with Hypotension and Tachycardia. SBP in the 80's and HR in the 150's. Patient has hx of Aortic Stenosis s/p TAVR, A-Fib on Eliquis , Diastolic CHF, OSA on CPAP, DM, HTN, HLD, Depression, Morbid Obesity. Found to have Sepsis, on IV abx. Cardiology consulted for possible SVT v/s 2-1 Atrial Flutter. Patient on Heparin  gtt.   From home with husband, has two supportive children. Modified independent, has a cane, walker, w/c, shower seat, rails at home and uses CPAP at bedtime.  PCP is Wendling, Mabel Mt, DO and uses [facility] in [place].   No TOC needs or recommendations noted at this time.  Patient not Medically ready for discharge.  CM will continue to follow as patient progresses with care towards discharge.         Transition of Care Asessment: Insurance and Status: Insurance coverage has been reviewed Patient has primary care physician: Yes Home environment has been reviewed: Yes Prior level of function:: Modified Independent   Social Drivers of Health Review: SDOH reviewed no interventions necessary Readmission risk has been reviewed: Yes Transition of care needs: no transition of care needs at this time

## 2023-11-14 NOTE — Consult Note (Signed)
 Cardiology Consultation:   Patient ID: Kara Lee MRN: [id number]; DOB: 1945/09/20  Admit date: 11/14/2023 Date of Consult: 11/14/2023  Primary Care Provider: Frann Mabel Mt, DO [facility] Cardiologist: Gordy Bergamo, MD  [facility] Electrophysiologist:  Fonda Kitty, MD   Patient Profile:   Kara Lee is a 78 y.o. female with paroxysmal AF, DM2, severe AS s/p TAVR (01/30/19, Dr. Wonda), obesity, GERD, OSA on CPAP who is being seen today for the evaluation of recurrent SVT at the request of Dr. Jerrol.  History of Present Illness:   Kara Lee was at home getting ready for bed and around 12:30 AM had acute onset flushing followed by palpitations. She sat down and the palpitations were more pronounced. She put her apple watch on and her HR was in the 150s. She then called EMS.   EMS arrived and found her to be hypotensive (sBP 80s) with HR int he 150s in sinus tach. She was given 2 L NS en route.   VS on ED arrival: BP 96/56, P 78, RR 17, O2 90%/RA.   Labs notable for mild leukocytosis (WBC 12.0, baseline 9-10), elevated BNP (172), and elevated lactate (3.3). hsT ruled out at 6, RVP negative for Covid/flu. Bcx pending. UA with WBCs but no bacteria. She denied any urinary or respiratory sx during my exam.   She had restoration to NSR prior to arrival with EMS.  Reviewed EMS run strips. HR 130-140s without obvious P waves or AFL waves, does appear to be retrograde P wave in aVL on one of the runs trops (02:59:48).   She has a long history of arrhythmias and was scheduled this week for AF ablation with Dr. Kitty. She has had history of arrhythmias for over 25 years. He had planned to also complete an EPS at the time of the AF ablation to evaluate for SVT that may be a trigger for her AF. Of note she has mostly been able to terminate the recent episodes with vagal maneuvers with Valsalva. She had 2 episodes this week, minutes long and 3 episodes the week prior.   She  cancelled her ablation this week and seems overwhelmed with her husbands care. He had a stroke in January and although her reports no residual deficits she seems consumed with his care.   Past Medical History:  Diagnosis Date   Anxiety    Cardiac arrhythmia    CHF (congestive heart failure) (HCC)    Colon polyp    Complication of anesthesia    Concussion 11/09/2012   From a fall at home on deck   Depression    postpartum depression after 2nd child   Diabetes mellitus (HCC)    Diverticulosis    Fatty liver    GERD (gastroesophageal reflux disease)    HLD (hyperlipidemia)    Hypertension    Morbid obesity (HCC)    OSA on CPAP    uses CPAP nightly   Osteoarthritis of right knee 07/31/2013   PONV (postoperative nausea and vomiting)    Rosacea    Severe aortic stenosis    Spinal stenosis    Past Surgical History:  Procedure Laterality Date   ABDOMINAL HYSTERECTOMY     still has uterus   ANKLE RECONSTRUCTION Left    ARTERY BIOPSY Left 01/21/2020   Procedure: BIOPSY TEMPORAL ARTERY;  Surgeon: Karis Clunes, MD;  Location: [facility];  Service: ENT;  Laterality: Left;   BIOPSY BREAST Bilateral    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   CESAREAN SECTION     x 2   CHOLECYSTECTOMY     COLONOSCOPY  04/21/2016   Single small external hemorrhoid, small internal hemorrhoids, mild sigmoid diverticulosis, 8 mm rectal polyp removed with snare   EYE SURGERY     lasik; X3 for crossed eyes; 1 eyelid surgery   INCISIONAL HERNIA REPAIR     PARTIAL KNEE ARTHROPLASTY Right 07/31/2013   Procedure: RIGHT UNICOMPARTMENTAL KNEE;  Surgeon: Fonda SHAUNNA Olmsted, MD;  Location: [facility] OR;  Service: Orthopedics;  Laterality: Right;   RIGHT/LEFT HEART CATH AND CORONARY ANGIOGRAPHY N/A 10/04/2017   Procedure: RIGHT/LEFT HEART CATH AND CORONARY ANGIOGRAPHY;  Surgeon: Ladona Heinz, MD;  Location: [facility] INVASIVE CV LAB;  Service: Cardiovascular;  Laterality: N/A;   TEE WITHOUT CARDIOVERSION N/A  08/31/2017   Procedure: TRANSESOPHAGEAL ECHOCARDIOGRAM (TEE);  Surgeon: Ladona Heinz, MD;  Location: [facility] ENDOSCOPY;  Service: Cardiovascular;  Laterality: N/A;   TEE WITHOUT CARDIOVERSION N/A 01/30/2019   Procedure: TRANSESOPHAGEAL ECHOCARDIOGRAM (TEE);  Surgeon: Wonda Sharper, MD;  Location: [facility] INVASIVE CV LAB;  Service: Open Heart Surgery;  Laterality: N/A;   TRANSCATHETER AORTIC VALVE REPLACEMENT, TRANSFEMORAL N/A 01/30/2019   Procedure: TRANSCATHETER AORTIC VALVE REPLACEMENT, TRANSFEMORAL;  Surgeon: Wonda Sharper, MD;  Location: [facility] INVASIVE CV LAB;  Service: Open Heart Surgery;  Laterality: N/A;   TUBAL LIGATION      Home Medications:  Prior to Admission medications   Medication Sig Start Date End Date Taking? Authorizing Provider  amoxicillin  (AMOXIL ) 500 MG capsule Take 4 capsules by mouth 1 hour before treatment 09/29/23     apixaban  (ELIQUIS ) 5 MG TABS tablet Take 1 tablet (5 mg total) by mouth 2 (two) times daily. 07/18/23 01/14/24  Frann Mabel Mt, DO  atenolol  (TENORMIN ) 25 MG tablet Take 1 tablet (25 mg total) by mouth 2 (two) times daily. 01/05/23   Ladona Heinz, MD  Blood Glucose Monitoring Suppl DEVI 1 each by Does not apply route in the morning, at noon, and at bedtime. May substitute to any manufacturer covered by patient's insurance. 06/03/23   Antonio Cyndee Jamee JONELLE, DO  cetirizine (ZYRTEC) 10 MG tablet Take 10 mg by mouth in the morning.    [provider]  diltiazem  (CARDIZEM ) 30 MG tablet Take 1 tablet (30 mg total) by mouth every 4 (four) hours as needed for heart rate > 100 and systolic blood pressure > 100. 08/22/23   Fenton, Clint R, PA  DULoxetine  (CYMBALTA ) 60 MG capsule Take 1 capsule (60 mg total) by mouth daily. 08/15/23   Frann Mabel Mt, DO  EPINEPHrine  (EPIPEN  2-PAK) 0.3 mg/0.3 mL IJ SOAJ injection Inject 0.3 mg into the muscle as needed for anaphylaxis. 03/15/22   Marinda Rocky SAILOR, MD  insulin  regular (NOVOLIN R) 100 units/mL injection Inject into  the skin daily per sliding scale. Max 40 units a day. 06/03/23   Antonio Cyndee Jamee JONELLE, DO  losartan  (COZAAR ) 25 MG tablet Take 1 tablet (25 mg total) by mouth daily. 04/11/23   Frann Mabel Mt, DO  metFORMIN  (GLUCOPHAGE -XR) 500 MG 24 hr tablet Take 1 tablet (500 mg total) by mouth 2 (two) times daily with a meal. 02/14/23   Wendling, Mabel Mt, DO  methocarbamol  (ROBAXIN ) 500 MG tablet Take 1 tablet (500 mg total) by mouth every 6 (six) to 8 (eight) hours as needed. 04/11/23   Frann Mabel Mt, DO  methylPREDNISolone  (MEDROL ) 4 MG TBPK tablet Take as directed on package over 6 days. Take 6 tablets by  mouth on day 1, 5 tablets on day 2, 4 tablets on day 3, 3 tablets on day 4, 2 tablets on day 5, and 1 tablet on day 6. 10/24/23     mometasone  (NASONEX ) 50 MCG/ACT nasal spray Place 2 sprays into the nose daily. 10/07/23   Frann Mabel Mt, DO  omeprazole  (PRILOSEC) 40 MG capsule Take 1 capsule (40 mg total) by mouth daily. 02/04/23   Frann Mabel Mt, DO  Pitavastatin  Magnesium  (ZYPITAMAG ) 4 MG TABS Take 1 tablet (4 mg total) by mouth daily. 04/26/23   Ladona Heinz, MD  SYSTANE COMPLETE PF 0.6 % SOLN Place 1 drop into both eyes See admin instructions. Instill 1 drop into both eyes two to three times a day    [provider]  tirzepatide  (MOUNJARO ) 5 MG/0.5ML Pen Inject 5 mg into the skin once a week. 10/18/23 02/07/24  Frann Mabel Mt, DO  triamcinolone  cream in Minerin Creme Apply topically 2 (two) times daily as needed for itching. 07/18/23   Frann Mabel Mt, DO  TYLENOL  8 HOUR 650 MG CR tablet Take 1,300 mg by mouth every 8 (eight) hours as needed for pain.    [provider]   Inpatient Medications: Scheduled Meds:  Continuous Infusions:  vancomycin  1,500 mg (11/14/23 0416)   PRN Meds:  Allergies:    Allergies  Allergen Reactions   Bee Venom Itching and Swelling   Latex Itching and Swelling   Lovenox [Enoxaparin Sodium] Other (See  Comments)    Bruising   Minocycline Nausea And Vomiting   Morphine  And Codeine Nausea And Vomiting   Nickel Other (See Comments)    Contact dermatitis    Shellfish Allergy Itching   Statins Other (See Comments)    Muscle aches   Sulfa Antibiotics Nausea And Vomiting   Tape Itching    Bandaids and adhesive tape   Cipro  [Ciprofloxacin  Hcl] Rash   Glimepiride Rash   Metronidazole  Rash   Social History:   Social History   Socioeconomic History   Marital status: Married    Spouse name: Not on file   Number of children: 2   Years of education: Not on file   Highest education level: 12th grade  Occupational History   Occupation: retired  Tobacco Use   Smoking status: Never   Smokeless tobacco: Never   Tobacco comments:    Never smoked 08/22/23  Vaping Use   Vaping status: Never Used  Substance and Sexual Activity   Alcohol use: Not Currently   Drug use: No   Sexual activity: Not Currently    Partners: Male    Birth control/protection: Surgical  Other Topics Concern   Not on file  Social History Narrative   Not on file   Social Drivers of Health   Financial Resource Strain: Low Risk  (09/30/2023)   Overall Financial Resource Strain (CARDIA)    Difficulty of Paying Living Expenses: Not very hard  Food Insecurity: No Food Insecurity (09/30/2023)   Hunger Vital Sign    Worried About Running Out of Food in the Last Year: Never true    Ran Out of Food in the Last Year: Never true  Transportation Needs: No Transportation Needs (09/30/2023)   PRAPARE - Administrator, Civil Service (Medical): No    Lack of Transportation (Non-Medical): No  Physical Activity: Insufficiently Active (09/30/2023)   Exercise Vital Sign    Days of Exercise per Week: 3 days    Minutes of Exercise per Session: 20  min  Stress: Stress Concern Present (09/30/2023)   [employer] of Occupational Health - Occupational Stress Questionnaire    Feeling of Stress: Rather much  Social  Connections: Unknown (09/30/2023)   Social Connection and Isolation Panel    Frequency of Communication with Friends and Family: Once a week    Frequency of Social Gatherings with Friends and Family: Patient declined    Attends Religious Services: Patient declined    Active Member of Clubs or Organizations: No    Attends Engineer, structural: Not on file    Marital Status: Married  Intimate Partner Violence: Unknown (03/23/2022)   Received from [facility]   HITS    Physically Hurt: Not on file    Insult or Talk Down To: Not on file    Threaten Physical Harm: Not on file    Scream or Curse: Not on file    Family History:    Family History  Problem Relation Age of Onset   Pancreatitis Mother    COPD Mother    Diabetes type II Mother    Cirrhosis Sister        NASH   Allergic rhinitis Maternal Grandmother    Breast cancer Maternal Grandmother    Cataracts Maternal Grandfather    Prostate cancer Maternal Grandfather    Heart disease Maternal Grandfather    Asthma Son    Kidney Stones Daughter    Immunodeficiency Neg Hx    Angioedema Neg Hx    Eczema Neg Hx    Urticaria Neg Hx     ROS:  Review of Systems: [y] = yes, [ ]  = no      General: Weight gain [ ] ; Weight loss [ ] ; Anorexia [ ] ; Fatigue [ ] ; Fever [ ] ; Chills [ ] ; Weakness [ ]    Cardiac: Chest pain/pressure [ ] ; Resting SOB [ ] ; Exertional SOB [ ] ; Orthopnea [ ] ; Pedal Edema [ ] ; Palpitations [y]; Syncope [ ] ; Presyncope [ ] ; Paroxysmal nocturnal tachycardia [y]   Pulmonary: Cough [ ] ; Wheezing [ ] ; Hemoptysis [ ] ; Sputum [ ] ; Snoring [ ]    GI: Vomiting [ ] ; Dysphagia [ ] ; Melena [ ] ; Hematochezia [ ] ; Heartburn [ ] ; Abdominal pain [ ] ; Constipation [ ] ; Diarrhea [ ] ; BRBPR [ ]    GU: Hematuria [ ] ; Dysuria [ ] ; Nocturia [ ]  Vascular: Pain in legs with walking [ ] ; Pain in feet with lying flat [ ] ; Non-healing sores [ ] ; Stroke [ ] ; TIA [ ] ; Slurred speech [ ] ;   Neuro: Headaches [ ] ; Vertigo [ ] ; Seizures [  ]; Paresthesias [ ] ;Blurred vision [ ] ; Diplopia [ ] ; Vision changes [ ]    Ortho/Skin: Arthritis [ ] ; Joint pain [ ] ; Muscle pain [ ] ; Joint swelling [ ] ; Back Pain [ ] ; Rash [ ]    Psych: Depression [ ] ; Anxiety [ ]    Heme: Bleeding problems [ ] ; Clotting disorders [ ] ; Anemia [ ]    Endocrine: Diabetes [ ] ; Thyroid  dysfunction [ ]    Physical Exam/Data:   Vitals:   11/14/23 0405 11/14/23 0410 11/14/23 0415 11/14/23 0420  BP: (!) 93/50 (!) 92/53 (!) 94/55 (!) 93/53  Pulse:      Resp: 15 15 12 11   Temp:      TempSrc:      SpO2:      Weight:      Height:        Intake/Output Summary (Last 24 hours) at 11/14/2023 0445 Last  data filed at 11/14/2023 0410 Gross per 24 hour  Intake 100 ml  Output --  Net 100 ml      11/14/2023    3:18 AM 10/09/2023    2:05 PM 10/03/2023    2:59 PM  Last 3 Weights  Weight (lbs) 189 lb 198 lb 198 lb  Weight (kg) 85.73 kg 89.812 kg 89.812 kg     Body mass index is 34.57 kg/m.  General:  Well nourished, well developed, in no acute distress HEENT: normal Neck: no JVD Vascular: No carotid bruits; FA pulses 2+ bilaterally without bruits  Cardiac:  normal S1, S2; RRR; no murmur  Lungs:  clear to auscultation bilaterally, no wheezing, rhonchi or rales  Abd: soft, nontender, no hepatomegaly  Ext: no edema Musculoskeletal:  No deformities, BUE and BLE strength normal and equal Skin: warm and dry  Neuro:  CNs 2-12 intact, no focal abnormalities noted Psych:  Normal affect   EKG:  The EKG (11/14/23, 03:18:55) was personally reviewed and demonstrates: NSR 66, PR 200, QRS 129, QT/c 407/427 Telemetry:  Telemetry was personally reviewed and demonstrates: NSR 60s  Relevant CV Studies: TTE Result date: 03/29/23  1. Left ventricular ejection fraction, by estimation, is 60 to 65%. The  left ventricle has normal function. The left ventricle has no regional  wall motion abnormalities. There is moderate left ventricular hypertrophy.  Left ventricular diastolic  function   could not be evaluated due to mitral annular calcification (moderate or  greater), but hemodynamics suggestive of grade 2 diastolic dysfunction.  Elevated left atrial pressure. The E/e' is 29.   2. Right ventricular systolic function is normal. The right ventricular  size is normal.   3. Left atrial size was mildly dilated.   4. The mitral valve is degenerative. Mild mitral valve regurgitation. No  evidence of mitral stenosis. Moderate mitral annular calcification.   5. 26 mm Edwards SAPIEN TAVR valve is well-seated, no evidence of  dehiscence, no aortic valvular regurgitation, no obvious valvular  stenosis, no perivalvular leak.   Laboratory Data:  High Sensitivity Troponin:   Recent Labs  Lab 11/14/23 0326  TROPONINIHS 6     Chemistry Recent Labs  Lab 11/14/23 0326  NA 138  K 3.6  CL 110  CO2 15*  GLUCOSE 134*  BUN 16  CREATININE 0.71  CALCIUM 8.6*  GFRNONAA >60  ANIONGAP 13    Recent Labs  Lab 11/14/23 0326  PROT 5.9*  ALBUMIN  3.0*  AST 20  ALT 16  ALKPHOS 56  BILITOT 0.6   Hematology Recent Labs  Lab 11/14/23 0326  WBC 12.0*  RBC 3.88  HGB 11.6*  HCT 36.7  MCV 94.6  MCH 29.9  MCHC 31.6  RDW 13.6  PLT 145*   BNP Recent Labs  Lab 11/14/23 0326  BNP 172.0*    DDimer No results for input(s): DDIMER in the last 168 hours.  Radiology/Studies:  DG Chest Port 1 View Result Date: 11/14/2023 CLINICAL DATA:  Possible sepsis EXAM: PORTABLE CHEST 1 VIEW COMPARISON:  12/20/2022 FINDINGS: Cardiac shadow is stable. Changes of prior TAVR are seen. Lungs are well aerated bilaterally. No focal infiltrate or effusion is seen. No bony abnormality is noted. IMPRESSION: No acute abnormality seen. Electronically Signed   By: Oneil Devonshire M.D.   On: 11/14/2023 03:34   Assessment and Plan:  SHANAIYA BENE is a 79 y.o. female with paroxysmal AF, DM2, severe AS s/p TAVR (01/30/19, Dr. Wonda), obesity, GERD, OSA on CPAP who presents  with recurrent sx SVT.    Symptomatic SVT Paroxysmal AF  She presents with symptomatic SVT 130-150s that is organized/regular. C/w either 2:1 AFL or SVT. There doesn't appear to be consistent AFL waves and possible retrograde P wave in one lead. She is also able to semi reliably terminate these episodes with Valsalva and has been doing this for years. 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 She has multiple medication intolerances. Intermittent LAFB/iRBBB and Qtc >440 precludes majority of AADs although she doesn't want to start another medication either way. She had normal cors on cath in 2019. Amio and dronedarone would be our only AAD options. Would like to avoid flecainide with intermittent conduction dz although not completely contraindicated. Wouldn't start amio as I think there is reasonable chance she could be inducible on EPS although with cancelling this week she is probably several months out from rescheduling now. Either way she would probably have a side effect to any of the AADs above based off her allergy list and my discussion with her. She has prn dilt on top of atenolol  25 mg bid and hasn't used this yet as she didn't understand how to take it. No medication changes this admission. Suspect her elevated lactate is 2/2 SVT but would hold atenolol  until infectious wkup complete. She is planning to reschedule ablation.   Severe AS s/p TAVR DM2 MDD  For questions or updates, please contact [facility] Please consult [email or web address] for contact info under   Signed, Donnice DELENA Primus, MD  11/14/2023 4:45 AM

## 2023-11-14 NOTE — ED Provider Notes (Signed)
 [place] EMERGENCY DEPARTMENT AT [facility] Provider Note   CSN: [id number] Arrival date & time: 11/14/23  9684     Patient presents with: Hypotension   Kara Lee is a 78 y.o. female.   HPI   78 year old female with medical history significant for severe aortic stenosis status post TAVR, atrial fibrillation on anticoagulation, mild diastolic CHF, OSA on CPAP, diabetes mellitus, HTN, HLD, depression, morbid obesity who presents to the emergency department with tachycardia and hypotension.  History is provided by EMS and the patient.  She has been feeling unwell for the past few days.  She endorses mild shortness of breath.  She has also had some increased urinary frequency.  EMS was called out to the patient's home for hypotension and elevated heart rate and they found the patient with systolic blood pressures in the 80s and a heart rate in the 150s.  Twelve-lead with EMS showed potentially 2-1 flutter versus SVT.  The patient was administered 2 L of normal saline prior to arrival. She denies chest pain.  States that she had sweatiness earlier.  Denies fevers or chills.  She has not missed any doses of her anticoagulation.  She denies any abdominal pain nausea or vomiting.  Prior to Admission medications   Medication Sig Start Date End Date Taking? Authorizing Provider  amoxicillin  (AMOXIL ) 500 MG capsule Take 4 capsules by mouth 1 hour before treatment 09/29/23     apixaban  (ELIQUIS ) 5 MG TABS tablet Take 1 tablet (5 mg total) by mouth 2 (two) times daily. 07/18/23 01/14/24  Frann Mabel Mt, DO  atenolol  (TENORMIN ) 25 MG tablet Take 1 tablet (25 mg total) by mouth 2 (two) times daily. 01/05/23   Ladona Heinz, MD  Blood Glucose Monitoring Suppl DEVI 1 each by Does not apply route in the morning, at noon, and at bedtime. May substitute to any manufacturer covered by patient's insurance. 06/03/23   Antonio Cyndee Jamee JONELLE, DO  cetirizine (ZYRTEC) 10 MG tablet Take 10 mg by mouth  in the morning.    [provider]  diltiazem  (CARDIZEM ) 30 MG tablet Take 1 tablet (30 mg total) by mouth every 4 (four) hours as needed for heart rate > 100 and systolic blood pressure > 100. 08/22/23   Fenton, Clint R, PA  DULoxetine  (CYMBALTA ) 60 MG capsule Take 1 capsule (60 mg total) by mouth daily. 08/15/23   Frann Mabel Mt, DO  EPINEPHrine  (EPIPEN  2-PAK) 0.3 mg/0.3 mL IJ SOAJ injection Inject 0.3 mg into the muscle as needed for anaphylaxis. 03/15/22   Marinda Rocky SAILOR, MD  insulin  regular (NOVOLIN R) 100 units/mL injection Inject into the skin daily per sliding scale. Max 40 units a day. 06/03/23   Antonio Cyndee Jamee JONELLE, DO  losartan  (COZAAR ) 25 MG tablet Take 1 tablet (25 mg total) by mouth daily. 04/11/23   Frann Mabel Mt, DO  metFORMIN  (GLUCOPHAGE -XR) 500 MG 24 hr tablet Take 1 tablet (500 mg total) by mouth 2 (two) times daily with a meal. 02/14/23   Wendling, Mabel Mt, DO  methocarbamol  (ROBAXIN ) 500 MG tablet Take 1 tablet (500 mg total) by mouth every 6 (six) to 8 (eight) hours as needed. 04/11/23   Frann Mabel Mt, DO  methylPREDNISolone  (MEDROL ) 4 MG TBPK tablet Take as directed on package over 6 days. Take 6 tablets by mouth on day 1, 5 tablets on day 2, 4 tablets on day 3, 3 tablets on day 4, 2 tablets on day 5, and 1 tablet  on day 6. 10/24/23     mometasone  (NASONEX ) 50 MCG/ACT nasal spray Place 2 sprays into the nose daily. 10/07/23   Frann Mabel Mt, DO  omeprazole  (PRILOSEC) 40 MG capsule Take 1 capsule (40 mg total) by mouth daily. 02/04/23   Frann Mabel Mt, DO  Pitavastatin  Magnesium  (ZYPITAMAG ) 4 MG TABS Take 1 tablet (4 mg total) by mouth daily. 04/26/23   Ladona Heinz, MD  SYSTANE COMPLETE PF 0.6 % SOLN Place 1 drop into both eyes See admin instructions. Instill 1 drop into both eyes two to three times a day    [provider]  tirzepatide  (MOUNJARO ) 5 MG/0.5ML Pen Inject 5 mg into the skin once a week. 10/18/23 02/07/24   Frann Mabel Mt, DO  triamcinolone  cream in Minerin Creme Apply topically 2 (two) times daily as needed for itching. 07/18/23   Frann Mabel Mt, DO  TYLENOL  8 HOUR 650 MG CR tablet Take 1,300 mg by mouth every 8 (eight) hours as needed for pain.    [provider]    Allergies: Bee venom, Latex, Lovenox [enoxaparin sodium], Minocycline, Morphine  and codeine, Nickel, Shellfish allergy, Statins, Sulfa antibiotics, Tape, Cipro  [ciprofloxacin  hcl], Glimepiride, and Metronidazole     Review of Systems  Constitutional:  Positive for diaphoresis.  Respiratory:  Positive for shortness of breath.   Genitourinary:  Positive for frequency.  All other systems reviewed and are negative.   Updated Vital Signs BP (!) 92/51   Pulse 70   Temp (!) 97.5 F (36.4 C) (Oral)   Resp 12   Ht 5' 2 (1.575 m)   Wt 85.7 kg   SpO2 93%   BMI 34.57 kg/m   Physical Exam Vitals and nursing note reviewed.  Constitutional:      General: She is not in acute distress.    Appearance: She is obese. She is not diaphoretic.  HENT:     Head: Normocephalic and atraumatic.  Eyes:     Conjunctiva/sclera: Conjunctivae normal.     Pupils: Pupils are equal, round, and reactive to light.  Cardiovascular:     Rate and Rhythm: Regular rhythm. Tachycardia present.     Pulses: Normal pulses.  Pulmonary:     Effort: Pulmonary effort is normal. No respiratory distress.  Abdominal:     General: There is no distension.     Tenderness: There is no abdominal tenderness. There is no guarding.  Musculoskeletal:        General: No deformity or signs of injury.     Cervical back: Neck supple.  Skin:    Capillary Refill: Capillary refill takes less than 2 seconds.     Findings: No lesion or rash.  Neurological:     General: No focal deficit present.     Mental Status: She is alert. Mental status is at baseline.     (all labs ordered are listed, but only abnormal results are displayed) Labs Reviewed   COMPREHENSIVE METABOLIC PANEL WITH GFR - Abnormal; Notable for the following components:      Result Value   CO2 15 (*)    Glucose, Bld 134 (*)    Calcium 8.6 (*)    Total Protein 5.9 (*)    Albumin  3.0 (*)    All other components within normal limits  CBC WITH DIFFERENTIAL/PLATELET - Abnormal; Notable for the following components:   WBC 12.0 (*)    Hemoglobin 11.6 (*)    Platelets 145 (*)    All other components within normal limits  BRAIN NATRIURETIC PEPTIDE - Abnormal; Notable for the following components:   B Natriuretic Peptide 172.0 (*)    All other components within normal limits  URINALYSIS, W/ REFLEX TO CULTURE (INFECTION SUSPECTED) - Abnormal; Notable for the following components:   APPearance HAZY (*)    Ketones, ur 5 (*)    Protein, ur 30 (*)    Leukocytes,Ua LARGE (*)    Non Squamous Epithelial 0-5 (*)    All other components within normal limits  I-STAT CG4 LACTIC ACID, ED - Abnormal; Notable for the following components:   Lactic Acid, Venous 3.3 (*)    All other components within normal limits  I-STAT CG4 LACTIC ACID, ED - Abnormal; Notable for the following components:   Lactic Acid, Venous 2.6 (*)    All other components within normal limits  TROPONIN I (HIGH SENSITIVITY) - Abnormal; Notable for the following components:   Troponin I (High Sensitivity) 212 (*)    All other components within normal limits  RESP PANEL BY RT-PCR (RSV, FLU A&B, COVID)  RVPGX2  CULTURE, BLOOD (ROUTINE X 2)  CULTURE, BLOOD (ROUTINE X 2)  URINE CULTURE  PROTIME-INR  TROPONIN I (HIGH SENSITIVITY)    EKG: EKG Interpretation Date/Time:  Monday November 14 2023 03:18:55 EDT Ventricular Rate:  66 PR Interval:  200 QRS Duration:  129 QT Interval:  407 QTC Calculation: 427 R Axis:   -55  Text Interpretation: Sinus arrhythmia LVH with IVCD, LAD and secondary repol abnrm Baseline wander in lead(s) V5 V6 Confirmed by Jerrol Agent ([id number]) on 11/14/2023 3:26:37 AM  Radiology: CT CHEST  ABDOMEN PELVIS W CONTRAST Result Date: 11/14/2023 EXAM: CT CHEST, ABDOMEN AND PELVIS WITH CONTRAST 11/14/2023 05:05:55 AM TECHNIQUE: CT of the chest, abdomen and pelvis was performed with the administration of intravenous contrast. Multiplanar reformatted images are provided for review. Automated exposure control, iterative reconstruction, and/or weight based adjustment of the mA/kV was utilized to reduce the radiation dose to as low as reasonably achievable. COMPARISON: CT angio chest 01/17/2019 and CT abdomen and pelvis 04/17/2020. CLINICAL HISTORY: Sepsis. Patient arrives via [facility] from home for hypotension and tachycardia. EMS found patient with SBP in the 80's and heart rate in the 150's. History of afib - EKG showed sinus tach. Endorses increased frequency of urination. 2L NS given PTA. FINDINGS: CHEST: MEDIASTINUM: Heart size is upper limits of normal. Mitral valve calcification. Status post TAVR. Increased caliber of the main pulmonary artery measures 3.5 cm. THORACIC LYMPH NODES: No mediastinal, hilar or axillary lymphadenopathy. LUNGS AND PLEURA: Within the paravertebral right lung base there is a 6 mm lung nodule, image axial image 146. New compared with the previous exam. No pleural effusion or pneumothorax. ABDOMEN AND PELVIS: LIVER: The liver is unremarkable. GALLBLADDER AND BILE DUCTS: Status post cholecystectomy. SPLEEN: No acute abnormality. PANCREAS: Scattered parenchymal calcifications identified within the head, body and tail of pancreas. ADRENAL GLANDS: No acute abnormality. KIDNEYS, URETERS AND BLADDER: Bilateral Bosniak class 1 and 2 kidney cysts are noted. The largest arises off the posterior medial cortex of the interpolar right kidney measuring 2.6 cm, axial image 73. No follow up imaging recommended. No stones in the kidneys or ureters. No hydronephrosis. No perinephric or periureteral stranding. Urinary bladder is unremarkable. GI AND BOWEL: The appendix is not confidently identified. No  secondary signs of acute appendicitis. Mild diffuse stool burden in the colon. Sigmoid diverticulosis without signs of acute diverticulitis. There is no bowel obstruction. REPRODUCTIVE ORGANS: No acute abnormality. PERITONEUM AND RETROPERITONEUM: No ascites. No  free air. VASCULATURE: Aortic atherosclerosis. Duplication of the IVC. ABDOMINAL AND PELVIS LYMPH NODES: No lymphadenopathy. BONES AND SOFT TISSUES: Lumbar degenerative disc disease with facet arthropathy. No acute osseous abnormality. No focal soft tissue abnormality. IMPRESSION: 1. No acute findings in the chest, abdomen, and pelvis related to sepsis. 2. New 6 mm right lung base nodule compared with the previous exam. Follow up CT of the chest at 6 to 8 months is recommended. If the patient is at increased risk an additional follow up exam at 18 to 24 months would be advised Electronically signed by: Waddell Calk MD 11/14/2023 05:28 AM EDT RP Workstation: [id number]   DG Chest Port 1 View Result Date: 11/14/2023 CLINICAL DATA:  Possible sepsis EXAM: PORTABLE CHEST 1 VIEW COMPARISON:  12/20/2022 FINDINGS: Cardiac shadow is stable. Changes of prior TAVR are seen. Lungs are well aerated bilaterally. No focal infiltrate or effusion is seen. No bony abnormality is noted. IMPRESSION: No acute abnormality seen. Electronically Signed   By: Oneil Devonshire M.D.   On: 11/14/2023 03:34     .Critical Care  Performed by: Jerrol Agent, MD Authorized by: Jerrol Agent, MD   Critical care provider statement:    Critical care time (minutes):  30   Critical care was necessary to treat or prevent imminent or life-threatening deterioration of the following conditions:  Shock and sepsis   Critical care was time spent personally by me on the following activities:  Development of treatment plan with patient or surrogate, discussions with consultants, evaluation of patient's response to treatment, examination of patient, ordering and review of laboratory studies,  ordering and review of radiographic studies, ordering and performing treatments and interventions, pulse oximetry, re-evaluation of patient's condition and review of old charts   Care discussed with: admitting provider      Medications Ordered in the ED  vancomycin  (VANCOREADY) IVPB 1500 mg/300 mL (1,500 mg Intravenous New Bag/Given 11/14/23 0416)  lactated ringers  infusion ( Intravenous New Bag/Given 11/14/23 0511)  norepinephrine  (LEVOPHED ) 4mg  in (0.016 mg/mL) premix infusion (2 mcg/min Intravenous New Bag/Given 11/14/23 0557)  hydrocortisone  sodium succinate  (SOLU-CORTEF ) 100 MG injection 100 mg (has no administration in time range)  piperacillin -tazobactam (ZOSYN ) IVPB 3.375 g (0 g Intravenous Stopped 11/14/23 0410)  lactated ringers  bolus 500 mL (0 mLs Intravenous Stopped 11/14/23 0449)  iohexol  (OMNIPAQUE ) 350 MG/ML injection 75 mL (75 mLs Intravenous Contrast Given 11/14/23 0507)    Clinical Course as of 11/14/23 0612  Mon Nov 14, 2023  0350 WBC(!): 12.0 [JL]  0350 Lactic Acid, Venous(!!): 3.3 [JL]  0428 CO2(!): 15 [JL]    Clinical Course User Index [JL] Jerrol Agent, MD                                 Medical Decision Making Amount and/or Complexity of Data Reviewed Labs: ordered. Decision-making details documented in ED Course. Radiology: ordered.  Risk Prescription drug management. Decision regarding hospitalization.    78 year old female with medical history significant for severe aortic stenosis status post TAVR, atrial fibrillation on anticoagulation, mild diastolic CHF, OSA on CPAP, diabetes mellitus, HTN, HLD, depression, morbid obesity who presents to the emergency department with tachycardia and hypotension.  History is provided by EMS and the patient.  She has been feeling unwell for the past few days.  She endorses mild shortness of breath.  She has also had some increased urinary frequency.  EMS was called out to the patient's home  for hypotension and  elevated heart rate and they found the patient with systolic blood pressures in the 80s and a heart rate in the 150s.  Twelve-lead with EMS showed potentially 2-1 flutter versus SVT.  The patient was administered 2 L of normal saline prior to arrival. She denies chest pain.  States that she had sweatiness earlier.  Denies fevers or chills.  She has not missed any doses of her anticoagulation.  She denies any abdominal pain nausea or vomiting.  On arrival, the patient was afebrile, soft blood pressures 96/56, initial heart rate with EMS was still in the 130s, had come down with fluids.  On transitioning the patient over to the bed from the EMS stretcher, the patient was found to have suddenly cardioverted and was seen in sinus rhythm with heart rates in the 70s and 60s on cardiac telemetry.  Her oxygen saturations were 88 to 87% on room air and she was placed on 2 L O2 via nasal cannula.  Septic workup initiated on patient arrival.  Considered atrial flutter with RVR as a compensatory reflex to the patient's potential septic presentation.  Patient without chest pain, has not missed any doses of her anticoagulation, no ripping or tearing discomfort or back appearing.  Low concern for aortic dissection, low concern for ACS, low concern for PE.  Last EF in December 2024 was 60 to 65% with some diastolic dysfunction.  EKG: Sinus arrhythmia, P waves present, ventricular rate 66, no STEMI.   Regarding the patient's possible SVT versus 2-1 atrial flutter, cardiology was consulted, full consult note by Dr. 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  Labs: CBC with a leukocytosis to 12.0, lactic acidosis present to 3.3, hemoglobin 11.6.  Lactic acid 3.3, urinalysis with large leukocytes, greater than 50 WBCs but negative nitrites, no bacteria seen, blood cultures collected prior to antibiotic administration.  CMP with a non-anion gap acidosis with a bicarbonate of 15, anion  gap of 13, normal renal and liver function, cardiac troponin 6, BNP nonspecifically mildly elevated to 172, repeat cardiac troponin pending.  Patient without chest pain or shortness of breath and saturating well on room air on repeat evaluation.  On repeat assessment, the patient remained hypotensive despite 2.5 L of fluid resuscitation, repeat lactic acid 2.6.  CT chest abdomen pelvis: IMPRESSION:  1. No acute findings in the chest, abdomen, and pelvis related to sepsis.  2. New 6 mm right lung base nodule compared with the previous exam. Follow up  CT of the chest at 6 to 8 months is recommended. If the patient is at increased  risk an additional follow up exam at 18 to 24 months would be advised    Discussed with pulmonary critical care further management as the patient was requiring pressors to maintain maps greater than 65, started on low-dose Levophed  in the emergency department.  Discussed with Dr. Layman.  The patient does admit to recent steroid use, considered adrenal crisis in the setting of recent steroid use and the patient was administered stress dose Solu-Cortef .  Repeat cardiac troponin to 12 however the patient remains chest pain-free, suspect likely demand in the setting of the patient's recent elevated heart rate.   Patient subsequently admitted to the ICU and critical but stabilized condition.       Final diagnoses:  Shock (HCC)  SVT (supraventricular tachycardia) (HCC)  Sepsis, due to unspecified organism, unspecified whether acute organ dysfunction present (HCC)  Lactic acidosis  Elevated troponin  ED Discharge Orders     None          Jerrol Agent, MD 11/14/23 [phone]

## 2023-11-14 NOTE — H&P (View-Only) (Signed)
 Kara Lee, MRN:  [id number], DOB:  [birth year], LOS: 0 ADMISSION DATE:  11/14/2023, CONSULTATION DATE:  11/14/23 REFERRING MD:  Jerrol CHIEF COMPLAINT:  A.fib   History of Present Illness:  Kara Lee is a 78 y.o. female who has a PMH as outlined below including but limited to paroxysmal AF on apixaban , severe AS status post TAVR 01/30/2019, DM 2, dCHF (echo from Dec 2024 with EF 60-65% G2DD), obesity, GERD, OSA on CPAP.  She presented to [facility] on 7/28 due to ongoing palpitations.  She has PAF and was apparently scheduled for an ablation; however, had to cancel this due to needing to care for her husband at home.  Heart rate intermittently gets high but is tolerable (she often treats it on her own with maneuvers like Valsalva); however, tonight, it became unbearable to the point where she was having significant palpitations along with diaphoresis.  She on her Apple Watch and it noted that her heart rate was in the 150s.  She subsequently called EMS and upon their arrival, she was hypotensive with SBP in the 80s and heart rate in the 150s, read as sinus tach.  She received fluids en route and had conversion back to NSR.  She has had recent steroid use including a Dosepak for shoulder problems by orthopedics.  This had minimal relief and was followed up by an intra-articular steroid injection.  Has any recent fever/chills/sweats, headaches, chest pains, dyspnea, N/V/abdominal pain, myalgias, exposure to known sick contacts, dysuria.  UA does show some WBCs but no bacteria and no nitrites. Denies hx of frequent UTI's or recent abx use.  Unfortunately in the ED, she continued to have hypotension despite a total of 2.5 L of IV fluids.  She was subsequently started on low-dose Levophed .  PCCM was called for admission.  Pertinent  Medical History:  has Osteoarthritis of right knee; Severe aortic stenosis; OSA on CPAP; Morbid obesity (HCC); Diabetes mellitus (HCC); GERD (gastroesophageal  reflux disease); HLD (hyperlipidemia); S/P TAVR (transcatheter aortic valve replacement) Transcatheter Aortic Valve Replacement - Percutaneous Transfemoral Approach Edwards Sapien 3 Ultra THV (size 26 mm ) 01/30/2019; Type 2 diabetes mellitus with obesity (HCC); Proteinuria; Depression, major, single episode, moderate (HCC); Essential hypertension; GAD (generalized anxiety disorder); Fibromyalgia; Polyp of colon; Chronic cough; Statin-induced myositis; Tenosynovitis, de Quervain; Trigger ring finger of left hand; Strain of left trapezius muscle; and Sepsis (HCC) on their problem list.  Significant Hospital Events: Including procedures, antibiotic start and stop dates in addition to other pertinent events   7/28 admit.  Interim History / Subjective:  Feels better now, currently in NSR.  On 2mcg/min NE with MAP 78.  Objective:  Blood pressure (!) 123/59, pulse (!) 51, temperature (!) 97.5 F (36.4 C), temperature source Oral, resp. rate 13, height 5' 2 (1.575 m), weight 85.7 kg, SpO2 95%.        Intake/Output Summary (Last 24 hours) at 11/14/2023 0644 Last data filed at 11/14/2023 9365 Gross per 24 hour  Intake 900 ml  Output --  Net 900 ml   Filed Weights   11/14/23 0318  Weight: 85.7 kg     Physical Exam: General: Adult female, resting in bed, in NAD. Neuro: A&O x 3, no deficits. HEENT: /AT. Sclerae anicteric. EOMI. MM dry. Cardiovascular: RRR, no M/R/G.  Lungs: Respirations even and unlabored.  CTA bilaterally, No W/R/R. Abdomen: BS x 4, soft, NT/ND.  Musculoskeletal: No gross deformities, no edema.    Labs/imaging personally reviewed:  CT  chest/abd/pelv 7/28 >36mm RLL nodule.  Assessment & Plan:   Septic shock - possibly due to UTI (though UA not overly impressive, some WBCs but no bacteria or nitrites).  Also concern for possible relative adrenal insufficiency given recent steroid use. - Empiric Vanco/Zosyn  for now. - Follow-up on urine culture. - Add on cortisol  levels. - Continue low dose NE for goal MAP > 65. - Empiric stress dose steroids. - Continue fluids.  Hx PAF on Apixaban , AS s/p TAVR 2020, dCHF (echo from Dec 2024 with EF 60-65%, G2DD). - Heparin  gtt in lieu of PTA Apixaban  for now. - Low dose NE as above, if has increasing doses would switch to Phenylephrine  as to not exacerbate PAF with NE. - Hold PTA Atenolol , Losartan . - Was scheduled for Ablation this week apparently but had to cancel due to home needs caring for husband. - Cardiology following, appreciate the assistance. - Consider EP consult, ? Ablation while in patient. - Consider repeat echo (last Dec 2024 with EF 60-65%, G2DD).  Lactic acidosis - 2/2 above, also on Metformin  PTA. - Hold PTA Metformin . - Continue fluids.  Hx DM. - SSI. - Hold PTA Metformin , Tirzepatide .   Hx OSA on CPAP. - Continue PTA nocturnal CPAP.  Incidentally found 6mm RLL pulmonary nodule. No prior smoking hx. - F/u CT scan in 6 - 8 months.  Best practice (evaluated daily):  Diet/type: Regular consistency (see orders) DVT prophylaxis: systemic heparin  Pressure ulcer(s): pressure ulcer assessment deferred  GI prophylaxis: N/A Lines: N/A Foley:  N/A Code Status:  full code Last date of multidisciplinary goals of care discussion: None yet.  Labs   CBC: Recent Labs  Lab 11/14/23 0326  WBC 12.0*  NEUTROABS 7.5  HGB 11.6*  HCT 36.7  MCV 94.6  PLT 145*    Basic Metabolic Panel: Recent Labs  Lab 11/14/23 0326  NA 138  K 3.6  CL 110  CO2 15*  GLUCOSE 134*  BUN 16  CREATININE 0.71  CALCIUM 8.6*   GFR: Estimated Creatinine Clearance: 61.7 mL/min (by C-G formula based on SCr of 0.71 mg/dL). Recent Labs  Lab 11/14/23 0326 11/14/23 0348 11/14/23 0540  WBC 12.0*  --   --   LATICACIDVEN  --  3.3* 2.6*    Liver Function Tests: Recent Labs  Lab 11/14/23 0326  AST 20  ALT 16  ALKPHOS 56  BILITOT 0.6  PROT 5.9*  ALBUMIN  3.0*   No results for input(s): LIPASE,  AMYLASE in the last 168 hours. No results for input(s): AMMONIA in the last 168 hours.  ABG    Component Value Date/Time   PHART 7.451 (H) 01/26/2019 0952   PCO2ART 30.4 (L) 01/26/2019 0952   PO2ART 51.1 (L) 01/26/2019 0952   HCO3 20.9 01/26/2019 0952   TCO2 21 (L) 01/30/2019 1219   ACIDBASEDEF 2.5 (H) 01/26/2019 0952   O2SAT 87.6 01/26/2019 0952     Coagulation Profile: Recent Labs  Lab 11/14/23 0326  INR 1.1    Cardiac Enzymes: No results for input(s): CKTOTAL, CKMB, CKMBINDEX, TROPONINI in the last 168 hours.  HbA1C: Hgb A1c MFr Bld  Date/Time Value Ref Range Status  07/18/2023 04:34 PM 7.8 (H) 4.6 - 6.5 % Final    Comment:    Glycemic Control Guidelines for People with Diabetes:Non Diabetic:  <6%Goal of Therapy: <7%Additional Action Suggested:  >8%   02/04/2023 02:59 PM 8.0 (H) 4.8 - 5.6 % Final    Comment:  Prediabetes: 5.7 - 6.4          Diabetes: >6.4          Glycemic control for adults with diabetes: <7.0     CBG: No results for input(s): GLUCAP in the last 168 hours.  Review of Systems:   All negative; except for those that are bolded, which indicate positives.  Constitutional: weight loss, weight gain, night sweats, fevers, chills, fatigue, weakness.  HEENT: headaches, sore throat, sneezing, nasal congestion, post nasal drip, difficulty swallowing, tooth/dental problems, visual complaints, visual changes, ear aches. Neuro: difficulty with speech, weakness, numbness, ataxia. CV:  chest pain, orthopnea, PND, swelling in lower extremities, dizziness, palpitations, syncope.  Resp: cough, hemoptysis, dyspnea, wheezing. GI: heartburn, indigestion, abdominal pain, nausea, vomiting, diarrhea, constipation, change in bowel habits, loss of appetite, hematemesis, melena, hematochezia.  GU: dysuria, change in color of urine, urgency or frequency, flank pain, hematuria. MSK: joint pain or swelling, decreased range of motion. Psych: change in  mood or affect, depression, anxiety, suicidal ideations, homicidal ideations. Skin: rash, itching, bruising.   Past Medical History:  She,  has a past medical history of Anxiety, Cardiac arrhythmia, CHF (congestive heart failure) (HCC), Colon polyp, Complication of anesthesia, Concussion (11/09/2012), Depression, Diabetes mellitus (HCC), Diverticulosis, Fatty liver, GERD (gastroesophageal reflux disease), HLD (hyperlipidemia), Hypertension, Morbid obesity (HCC), OSA on CPAP, Osteoarthritis of right knee (07/31/2013), PONV (postoperative nausea and vomiting), Rosacea, Severe aortic stenosis, and Spinal stenosis.   Surgical History:   Past Surgical History:  Procedure Laterality Date   ABDOMINAL HYSTERECTOMY     still has uterus   ANKLE RECONSTRUCTION Left    ARTERY BIOPSY Left 01/21/2020   Procedure: BIOPSY TEMPORAL ARTERY;  Surgeon: Karis Clunes, MD;  Location: [facility];  Service: ENT;  Laterality: Left;   BIOPSY BREAST Bilateral    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   CESAREAN SECTION     x 2   CHOLECYSTECTOMY     COLONOSCOPY  04/21/2016   Single small external hemorrhoid, small internal hemorrhoids, mild sigmoid diverticulosis, 8 mm rectal polyp removed with snare   EYE SURGERY     lasik; X3 for crossed eyes; 1 eyelid surgery   INCISIONAL HERNIA REPAIR     PARTIAL KNEE ARTHROPLASTY Right 07/31/2013   Procedure: RIGHT UNICOMPARTMENTAL KNEE;  Surgeon: Fonda SHAUNNA Olmsted, MD;  Location: [facility] OR;  Service: Orthopedics;  Laterality: Right;   RIGHT/LEFT HEART CATH AND CORONARY ANGIOGRAPHY N/A 10/04/2017   Procedure: RIGHT/LEFT HEART CATH AND CORONARY ANGIOGRAPHY;  Surgeon: Ladona Heinz, MD;  Location: [facility] INVASIVE CV LAB;  Service: Cardiovascular;  Laterality: N/A;   TEE WITHOUT CARDIOVERSION N/A 08/31/2017   Procedure: TRANSESOPHAGEAL ECHOCARDIOGRAM (TEE);  Surgeon: Ladona Heinz, MD;  Location: [facility] ENDOSCOPY;  Service: Cardiovascular;  Laterality: N/A;   TEE WITHOUT  CARDIOVERSION N/A 01/30/2019   Procedure: TRANSESOPHAGEAL ECHOCARDIOGRAM (TEE);  Surgeon: Wonda Sharper, MD;  Location: [facility] INVASIVE CV LAB;  Service: Open Heart Surgery;  Laterality: N/A;   TRANSCATHETER AORTIC VALVE REPLACEMENT, TRANSFEMORAL N/A 01/30/2019   Procedure: TRANSCATHETER AORTIC VALVE REPLACEMENT, TRANSFEMORAL;  Surgeon: Wonda Sharper, MD;  Location[facility] INVASIVE CV LAB;  Service: Open Heart Surgery;  Laterality: N/A;   TUBAL LIGATION       Social History:   reports that she has never smoked. She has never used smokeless tobacco. She reports that she does not currently use alcohol. She reports that she does not use drugs.   Family History:  Her family history includes Allergic rhinitis  in her maternal grandmother; Asthma in her son; Breast cancer in her maternal grandmother; COPD in her mother; Cataracts in her maternal grandfather; Cirrhosis in her sister; Diabetes type II in her mother; Heart disease in her maternal grandfather; Kidney Stones in her daughter; Pancreatitis in her mother; Prostate cancer in her maternal grandfather. There is no history of Immunodeficiency, Angioedema, Eczema, or Urticaria.   Allergies Allergies  Allergen Reactions   Bee Venom Itching and Swelling   Shellfish Allergy Itching   Sulfa Antibiotics Nausea And Vomiting   Cipro  [Ciprofloxacin  Hcl] Rash   Glimepiride Rash   Latex Itching and Swelling   Lovenox [Enoxaparin Sodium] Other (See Comments)    Bruising   Metronidazole  Rash   Minocycline Nausea And Vomiting   Morphine  And Codeine Nausea And Vomiting   Nickel Other (See Comments)    Contact dermatitis    Statins Other (See Comments)    Muscle aches   Tape Itching    Bandaids and adhesive tape     Home Medications  Prior to Admission medications   Medication Sig Start Date End Date Taking? Authorizing Provider  apixaban  (ELIQUIS ) 5 MG TABS tablet Take 1 tablet (5 mg total) by mouth 2 (two) times daily. 07/18/23 01/14/24  Frann Mabel Mt, DO  atenolol  (TENORMIN ) 25 MG tablet Take 1 tablet (25 mg total) by mouth 2 (two) times daily. 01/05/23   Ladona Heinz, MD  cetirizine (ZYRTEC) 10 MG tablet Take 10 mg by mouth in the morning.    [provider]  diltiazem  (CARDIZEM ) 30 MG tablet Take 1 tablet (30 mg total) by mouth every 4 (four) hours as needed for heart rate > 100 and systolic blood pressure > 100. 08/22/23   Fenton, Clint R, PA  DULoxetine  (CYMBALTA ) 60 MG capsule Take 1 capsule (60 mg total) by mouth daily. 08/15/23   Frann Mabel Mt, DO  EPINEPHrine  (EPIPEN  2-PAK) 0.3 mg/0.3 mL IJ SOAJ injection Inject 0.3 mg into the muscle as needed for anaphylaxis. 03/15/22   Marinda Rocky SAILOR, MD  insulin  regular (NOVOLIN R) 100 units/mL injection Inject into the skin daily per sliding scale. Max 40 units a day. 06/03/23   Antonio Cyndee Jamee JONELLE, DO  losartan  (COZAAR ) 25 MG tablet Take 1 tablet (25 mg total) by mouth daily. 04/11/23   Frann Mabel Mt, DO  metFORMIN  (GLUCOPHAGE -XR) 500 MG 24 hr tablet Take 1 tablet (500 mg total) by mouth 2 (two) times daily with a meal. 02/14/23   Wendling, Mabel Mt, DO  methocarbamol  (ROBAXIN ) 500 MG tablet Take 1 tablet (500 mg total) by mouth every 6 (six) to 8 (eight) hours as needed. 04/11/23   Frann Mabel Mt, DO  mometasone  (NASONEX ) 50 MCG/ACT nasal spray Place 2 sprays into the nose daily. 10/07/23   Frann Mabel Mt, DO  omeprazole  (PRILOSEC) 40 MG capsule Take 1 capsule (40 mg total) by mouth daily. 02/04/23   Frann Mabel Mt, DO  Pitavastatin  Magnesium  (ZYPITAMAG ) 4 MG TABS Take 1 tablet (4 mg total) by mouth daily. 04/26/23   Ladona Heinz, MD  SYSTANE COMPLETE PF 0.6 % SOLN Place 1 drop into both eyes See admin instructions. Instill 1 drop into both eyes two to three times a day    [provider]  tirzepatide  (MOUNJARO ) 5 MG/0.5ML Pen Inject 5 mg into the skin once a week. 10/18/23 02/07/24  Frann Mabel Mt, DO  triamcinolone   cream in Minerin Creme Apply topically 2 (two) times daily as needed for itching. 07/18/23  Frann Mabel Mt, DO  TYLENOL  8 HOUR 650 MG CR tablet Take 1,300 mg by mouth every 8 (eight) hours as needed for pain.    [provider]     Critical care time: 35 min.   Sammi Gore, PA - [facility] For pager details, please see AMION or use Epic chat  After 1900, please call [facility] for cross coverage needs 11/14/2023, 6:44 AM

## 2023-11-14 NOTE — Plan of Care (Signed)

## 2023-11-15 ENCOUNTER — Inpatient Hospital Stay (HOSPITAL_COMMUNITY)

## 2023-11-15 ENCOUNTER — Other Ambulatory Visit: Payer: Self-pay | Admitting: Pulmonary Disease

## 2023-11-15 DIAGNOSIS — I471 Supraventricular tachycardia, unspecified: Secondary | ICD-10-CM

## 2023-11-15 DIAGNOSIS — R911 Solitary pulmonary nodule: Secondary | ICD-10-CM

## 2023-11-15 DIAGNOSIS — I480 Paroxysmal atrial fibrillation: Secondary | ICD-10-CM | POA: Diagnosis not present

## 2023-11-15 DIAGNOSIS — I959 Hypotension, unspecified: Secondary | ICD-10-CM | POA: Diagnosis not present

## 2023-11-15 DIAGNOSIS — J9690 Respiratory failure, unspecified, unspecified whether with hypoxia or hypercapnia: Secondary | ICD-10-CM | POA: Diagnosis not present

## 2023-11-15 DIAGNOSIS — I700 Atherosclerosis of aorta: Secondary | ICD-10-CM | POA: Diagnosis not present

## 2023-11-15 DIAGNOSIS — A419 Sepsis, unspecified organism: Secondary | ICD-10-CM | POA: Diagnosis not present

## 2023-11-15 LAB — BASIC METABOLIC PANEL WITH GFR
Anion gap: 10 (ref 5–15)
BUN: 13 mg/dL (ref 8–23)
CO2: 20 mmol/L — ABNORMAL LOW (ref 22–32)
Calcium: 9 mg/dL (ref 8.9–10.3)
Chloride: 110 mmol/L (ref 98–111)
Creatinine, Ser: 0.68 mg/dL (ref 0.44–1.00)
GFR, Estimated: >60 mL/min (ref >60)
Glucose, Bld: 182 mg/dL — ABNORMAL HIGH (ref 70–99)
Potassium: 3.5 mmol/L (ref 3.5–5.1)
Sodium: 140 mmol/L (ref 135–145)

## 2023-11-15 LAB — CBC
HCT: 29.9 % — ABNORMAL LOW (ref 36.0–46.0)
Hemoglobin: 9.6 g/dL — ABNORMAL LOW (ref 12.0–15.0)
MCH: 29.6 pg (ref 26.0–34.0)
MCHC: 32.1 g/dL (ref 30.0–36.0)
MCV: 92.3 fL (ref 80.0–100.0)
Platelets: 111 K/uL — ABNORMAL LOW (ref 150–400)
RBC: 3.24 MIL/uL — ABNORMAL LOW (ref 3.87–5.11)
RDW: 13.7 % (ref 11.5–15.5)
WBC: 9.7 K/uL (ref 4.0–10.5)
nRBC: 0 % (ref 0.0–0.2)

## 2023-11-15 LAB — GLUCOSE, CAPILLARY: Glucose-Capillary: 100 mg/dL — ABNORMAL HIGH (ref 70–99)

## 2023-11-15 LAB — APTT: aPTT: 62 s — ABNORMAL HIGH (ref 24–36)

## 2023-11-15 LAB — PHOSPHORUS: Phosphorus: 2.9 mg/dL (ref 2.5–4.6)

## 2023-11-15 LAB — HEPARIN LEVEL (UNFRACTIONATED): Heparin Unfractionated: 0.69 [IU]/mL (ref 0.30–0.70)

## 2023-11-15 LAB — MAGNESIUM: Magnesium: 1.9 mg/dL (ref 1.7–2.4)

## 2023-11-15 MED ORDER — MAGNESIUM SULFATE 2 GM/50ML IV SOLN
2.0000 g | Freq: Once | INTRAVENOUS | Status: AC
  Administered 2023-11-15: 2 g via INTRAVENOUS
  Filled 2023-11-15: qty 50

## 2023-11-15 MED ORDER — POTASSIUM CHLORIDE CRYS ER 20 MEQ PO TBCR
40.0000 meq | EXTENDED_RELEASE_TABLET | Freq: Once | ORAL | Status: AC
  Administered 2023-11-15: 40 meq via ORAL
  Filled 2023-11-15: qty 2

## 2023-11-15 MED ORDER — APIXABAN 5 MG PO TABS
5.0000 mg | ORAL_TABLET | Freq: Two times a day (BID) | ORAL | Status: DC
  Administered 2023-11-15: 5 mg via ORAL
  Filled 2023-11-15: qty 1

## 2023-11-15 NOTE — Progress Notes (Signed)
 NAMECAYCE Lee, MRN:  [id number], DOB:  1946-04-12, LOS: 1 ADMISSION DATE:  11/14/2023, CONSULTATION DATE:  11/14/2023 REFERRING MD:  Jerrol, CHIEF COMPLAINT:  A .Fib   History of Present Illness:  Kara Lee is a 78 y.o. female who has a PMH as outlined below including but limited to paroxysmal AF on apixaban , severe AS status post TAVR 01/30/2019, DM 2, dCHF (echo from Dec 2024 with EF 60-65% G2DD), obesity, GERD, OSA on CPAP.   She presented to [facility] ED on 7/28 due to ongoing palpitations.  She has PAF and was apparently scheduled for an ablation; however, had to cancel this due to needing to care for her husband at home.  Heart rate intermittently gets high but is tolerable (she often treats it on her own with maneuvers like Valsalva); however, tonight, it became unbearable to the point where she was having significant palpitations along with diaphoresis.  She on her Apple Watch and it noted that her heart rate was in the 150s.  She subsequently called EMS and upon their arrival, she was hypotensive with SBP in the 80s and heart rate in the 150s, read as sinus tach.  She received fluids en route and had conversion back to NSR.   She has had recent steroid use including a Dosepak for shoulder problems by orthopedics.  This had minimal relief and was followed up by an intra-articular steroid injection.   Has any recent fever/chills/sweats, headaches, chest pains, dyspnea, N/V/abdominal pain, myalgias, exposure to known sick contacts, dysuria.  UA does show some WBCs but no bacteria and no nitrites. Denies hx of frequent UTI's or recent abx use.   Unfortunately in the ED, she continued to have hypotension despite a total of 2.5 L of IV fluids.  She was subsequently started on low-dose Levophed .  PCCM was called for admission.  Pertinent  Medical History  has Osteoarthritis of right knee; Severe aortic stenosis; OSA on CPAP; Morbid obesity (HCC); Diabetes mellitus (HCC); GERD  (gastroesophageal reflux disease); HLD (hyperlipidemia); S/P TAVR (transcatheter aortic valve replacement) Transcatheter Aortic Valve Replacement - Percutaneous Transfemoral Approach Edwards Sapien 3 Ultra THV (size 26 mm ) 01/30/2019; Type 2 diabetes mellitus with obesity (HCC); Proteinuria; Depression, major, single episode, moderate (HCC); Essential hypertension; GAD (generalized anxiety disorder); Fibromyalgia; Polyp of colon; Chronic cough; Statin-induced myositis; Tenosynovitis, de Quervain; Trigger ring finger of left hand; Strain of left trapezius muscle; and Sepsis (HCC) on their problem list.   Significant Hospital Events: Including procedures, antibiotic start and stop dates in addition to other pertinent events   7/28-admitted  Interim History / Subjective:  -Uneventful night Weaned off pressors Not having any significant complaints today  Objective    Blood pressure (!) 106/56, pulse (!) 55, temperature 97.8 F (36.6 C), temperature source Axillary, resp. rate 14, height 5' 2 (1.575 m), weight 86 kg, SpO2 91%.    Pressure Support:  [7 cmH20] 7 cmH20   Intake/Output Summary (Last 24 hours) at 11/15/2023 0737 Last data filed at 11/15/2023 9353 Gross per 24 hour  Intake 2062.73 ml  Output 2100 ml  Net -37.27 ml   Filed Weights   11/14/23 0318 11/15/23 0336  Weight: 85.7 kg 86 kg    Examination: General: Elderly, does not appear to be in distress HENT: Moist oral mucosa Lungs: Clear breath sounds Cardiovascular: S1-S2 appreciated Abdomen: Soft, bowel sounds appreciated Extremities: No clubbing, no edema Neuro: Awake alert oriented x 3 GU:   Resolved problem list   Assessment  and Plan   Septic shock -Cultures negative today - Labs unremarkable at present - Not having any symptoms suggesting ongoing infection - Will discontinue antibiotics  History of paroxysmal atrial fibrillation on apixaban  S/p TAVR 2020, diastolic heart failure - Discontinue heparin  -  Switch back to Eliquis  - Resume home medications including atenolol , losartan   History of diabetes - To resume home medications metformin , attest appetite  History of obstructive sleep apnea - Continue home CPAP  Incidental 6 mm lung nodule - Follow-up CT in 6 months - No prior smoking history  Hemodynamics have been stable - Will plan for discharge today  Best Practice (right click and Reselect all SmartList Selections daily)   Diet/type: Regular consistency (see orders) DVT prophylaxis DOAC Pressure ulcer(s): N/A GI prophylaxis: N/A Lines: N/A Foley:  N/A Code Status:  full code Last date of multidisciplinary goals of care discussion [patient awake alert interactive]  Labs   CBC: Recent Labs  Lab 11/14/23 0326 11/15/23 0205  WBC 12.0* 9.7  NEUTROABS 7.5  --   HGB 11.6* 9.6*  HCT 36.7 29.9*  MCV 94.6 92.3  PLT 145* 111*    Basic Metabolic Panel: Recent Labs  Lab 11/14/23 0326 11/15/23 0205  NA 138 140  K 3.6 3.5  CL 110 110  CO2 15* 20*  GLUCOSE 134* 182*  BUN 16 13  CREATININE 0.71 0.68  CALCIUM 8.6* 9.0  MG  --  1.9  PHOS  --  2.9   GFR: Estimated Creatinine Clearance: 61.9 mL/min (by C-G formula based on SCr of 0.68 mg/dL). Recent Labs  Lab 11/14/23 0326 11/14/23 0348 11/14/23 0540 11/14/23 0908 11/15/23 0205  WBC 12.0*  --   --   --  9.7  LATICACIDVEN  --  3.3* 2.6* 1.8  --     Liver Function Tests: Recent Labs  Lab 11/14/23 0326  AST 20  ALT 16  ALKPHOS 56  BILITOT 0.6  PROT 5.9*  ALBUMIN  3.0*   No results for input(s): LIPASE, AMYLASE in the last 168 hours. No results for input(s): AMMONIA in the last 168 hours.  ABG    Component Value Date/Time   PHART 7.451 (H) 01/26/2019 0952   PCO2ART 30.4 (L) 01/26/2019 0952   PO2ART 51.1 (L) 01/26/2019 0952   HCO3 20.9 01/26/2019 0952   TCO2 21 (L) 01/30/2019 1219   ACIDBASEDEF 2.5 (H) 01/26/2019 0952   O2SAT 87.6 01/26/2019 0952     Coagulation Profile: Recent Labs   Lab 11/14/23 0326  INR 1.1    Cardiac Enzymes: No results for input(s): CKTOTAL, CKMB, CKMBINDEX, TROPONINI in the last 168 hours.  HbA1C: Hgb A1c MFr Bld  Date/Time Value Ref Range Status  07/18/2023 04:34 PM 7.8 (H) 4.6 - 6.5 % Final    Comment:    Glycemic Control Guidelines for People with Diabetes:Non Diabetic:  <6%Goal of Therapy: <7%Additional Action Suggested:  >8%   02/04/2023 02:59 PM 8.0 (H) 4.8 - 5.6 % Final    Comment:             Prediabetes: 5.7 - 6.4          Diabetes: >6.4          Glycemic control for adults with diabetes: <7.0     CBG: Recent Labs  Lab 11/14/23 1114 11/14/23 1531 11/14/23 1916 11/14/23 2258 11/15/23 0710  GLUCAP 212* 187* 195* 228* 100*    Review of Systems:   Denies any significant symptoms  Past Medical History:  She,  has a past medical history of Anxiety, Cardiac arrhythmia, CHF (congestive heart failure) (HCC), Colon polyp, Complication of anesthesia, Concussion (11/09/2012), Depression, Diabetes mellitus (HCC), Diverticulosis, Fatty liver, GERD (gastroesophageal reflux disease), HLD (hyperlipidemia), Hypertension, Morbid obesity (HCC), OSA on CPAP, Osteoarthritis of right knee (07/31/2013), PONV (postoperative nausea and vomiting), Rosacea, Severe aortic stenosis, and Spinal stenosis.   Surgical History:   Past Surgical History:  Procedure Laterality Date   ABDOMINAL HYSTERECTOMY     still has uterus   ANKLE RECONSTRUCTION Left    ARTERY BIOPSY Left 01/21/2020   Procedure: BIOPSY TEMPORAL ARTERY;  Surgeon: Karis Clunes, MD;  Location: [facility];  Service: ENT;  Laterality: Left;   BIOPSY BREAST Bilateral    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   CESAREAN SECTION     x 2   CHOLECYSTECTOMY     COLONOSCOPY  04/21/2016   Single small external hemorrhoid, small internal hemorrhoids, mild sigmoid diverticulosis, 8 mm rectal polyp removed with snare   EYE SURGERY     lasik; X3 for crossed eyes; 1  eyelid surgery   INCISIONAL HERNIA REPAIR     PARTIAL KNEE ARTHROPLASTY Right 07/31/2013   Procedure: RIGHT UNICOMPARTMENTAL KNEE;  Surgeon: Fonda SHAUNNA Olmsted, MD;  Location: [facility] OR;  Service: Orthopedics;  Laterality: Right;   RIGHT/LEFT HEART CATH AND CORONARY ANGIOGRAPHY N/A 10/04/2017   Procedure: RIGHT/LEFT HEART CATH AND CORONARY ANGIOGRAPHY;  Surgeon: Ladona Heinz, MD;  Location: [facility] INVASIVE CV LAB;  Service: Cardiovascular;  Laterality: N/A;   TEE WITHOUT CARDIOVERSION N/A 08/31/2017   Procedure: TRANSESOPHAGEAL ECHOCARDIOGRAM (TEE);  Surgeon: Ladona Heinz, MD;  Location: [facility] ENDOSCOPY;  Service: Cardiovascular;  Laterality: N/A;   TEE WITHOUT CARDIOVERSION N/A 01/30/2019   Procedure: TRANSESOPHAGEAL ECHOCARDIOGRAM (TEE);  Surgeon: Wonda Sharper, MD;  Location: [facility] INVASIVE CV LAB;  Service: Open Heart Surgery;  Laterality: N/A;   TRANSCATHETER AORTIC VALVE REPLACEMENT, TRANSFEMORAL N/A 01/30/2019   Procedure: TRANSCATHETER AORTIC VALVE REPLACEMENT, TRANSFEMORAL;  Surgeon: Wonda Sharper, MD;  Location: [facility] INVASIVE CV LAB;  Service: Open Heart Surgery;  Laterality: N/A;   TUBAL LIGATION       Social History:   reports that she has never smoked. She has never used smokeless tobacco. She reports that she does not currently use alcohol. She reports that she does not use drugs.   Family History:  Her family history includes Allergic rhinitis in her maternal grandmother; Asthma in her son; Breast cancer in her maternal grandmother; COPD in her mother; Cataracts in her maternal grandfather; Cirrhosis in her sister; Diabetes type II in her mother; Heart disease in her maternal grandfather; Kidney Stones in her daughter; Pancreatitis in her mother; Prostate cancer in her maternal grandfather. There is no history of Immunodeficiency, Angioedema, Eczema, or Urticaria.   Allergies Allergies  Allergen Reactions   Bee Venom Itching and Swelling   Cipro  [Ciprofloxacin  Hcl] Rash   Glimepiride Rash    Latex Itching and Swelling   Metronidazole  Rash   Shellfish Allergy Itching   Statins Other (See Comments)    Muscle aches   Tape Itching    Bandaids and adhesive tape    The patient is critically ill with multiple organ systems failure and requires high complexity decision making for assessment and support, frequent evaluation and titration of therapies, application of advanced monitoring technologies and extensive interpretation of multiple databases. Critical Care Time devoted to patient care services described in this note independent of APP/resident time (if applicable)  is 32 minutes.  Jennet Epley MD [facility] Personal pager: See Amion If unanswered, please page CCM On-call: [phone]

## 2023-11-15 NOTE — Progress Notes (Signed)
 Pt ready for d/c and vitals WNL

## 2023-11-15 NOTE — Progress Notes (Signed)
 [facility] ADULT ICU REPLACEMENT PROTOCOL   The patient does apply for the [facility] Adult ICU Electrolyte Replacment Protocol based on the criteria listed below:   1.Exclusion criteria: TCTS, ECMO, Dialysis, and Myasthenia Gravis patients 2. Is GFR >/= 30 ml/min? Yes.    Patient's GFR today is >60 3. Is SCr </= 2? Yes.   Patient's SCr is 0.68 mg/dL 4. Did SCr increase >/= 0.5 in 24 hours? No. 5.Pt's weight >40kg  Yes.   6. Abnormal electrolyte(s):   K 3.5, Mg 1.9  7. Electrolytes replaced per protocol 8.  Call MD STAT for K+ </= 2.5, Phos </= 1, or Mag </= 1 Physician:  A. Haze Blackbird R Council Munguia 11/15/2023 5:46 AM

## 2023-11-15 NOTE — TOC Transition Note (Signed)
 Transition of Care [facility]) - Discharge Note   Patient Details  Name: Kara Lee MRN: [id number] Date of Birth: 1945-08-28  Transition of Care [facility] [place]) CM/SW Contact:  Tom-Johnson, Harvest Muskrat, RN Phone Number: 11/15/2023, 10:04 AM   Clinical Narrative:     Patient is scheduled for discharge today.  Readmission Risk Assessment done. Outpatient f/u, hospital f/u and discharge instructions on AVS. Patient requested for a cab, Therapist, nutritional and Release of Liability form explained to patient with understanding verbalized, form signed and placed in patient's chart. Cab voucher given to RN. No further TOC needs noted.        Final next level of care: Home/Self Care Barriers to Discharge: Barriers Resolved   Patient Goals and CMS Choice Patient states their goals for this hospitalization and ongoing recovery are:: To return home CMS Medicare.gov Compare Post Acute Care list provided to:: Patient Choice offered to / list presented to : Patient      Discharge Placement                Patient to be transferred to facility by: [facility]      Discharge Plan and Services Additional resources added to the After Visit Summary for                  DME Arranged: N/A DME Agency: NA       HH Arranged: NA HH Agency: NA        Social Drivers of Health (SDOH) Interventions SDOH Screenings   Food Insecurity: No Food Insecurity (11/14/2023)  Housing: Low Risk  (11/14/2023)  Transportation Needs: No Transportation Needs (11/14/2023)  Utilities: Not At Risk (11/14/2023)  Alcohol Screen: Low Risk  (09/18/2021)  Depression (PHQ2-9): Low Risk  (06/17/2023)  Financial Resource Strain: Low Risk  (09/30/2023)  Physical Activity: Insufficiently Active (09/30/2023)  Social Connections: Patient Declined (11/14/2023)  Stress: Stress Concern Present (09/30/2023)  Tobacco Use: Low Risk  (11/14/2023)     Readmission Risk Interventions    11/14/2023    1:59 PM  Readmission Risk Prevention Plan   Post Dischage Appt Complete  Medication Screening Complete  Transportation Screening Complete

## 2023-11-15 NOTE — Progress Notes (Signed)
 PHARMACY - ANTICOAGULATION CONSULT NOTE  Pharmacy Consult for Heparin  Indication: atrial fibrillation  Allergies  Allergen Reactions   Bee Venom Itching and Swelling   Cipro  [Ciprofloxacin  Hcl] Rash   Glimepiride Rash   Latex Itching and Swelling   Metronidazole  Rash   Shellfish Allergy Itching   Statins Other (See Comments)    Muscle aches   Tape Itching    Bandaids and adhesive tape    Patient Measurements: Height: 5' 2 (157.5 cm) Weight: 85.7 kg (189 lb) IBW/kg (Calculated) : 50.1 HEPARIN  DW (KG): 69.6  Vital Signs: Temp: 98.3 F (36.8 C) (07/28 2255) Temp Source: Oral (07/28 2255) BP: 100/54 (07/29 0300) Pulse Rate: 57 (07/29 0300)  Labs: Recent Labs    11/14/23 0326 11/14/23 0512 11/14/23 1608 11/15/23 0205  HGB 11.6*  --   --   --   HCT 36.7  --   --   --   PLT 145*  --   --   --   APTT  --   --  45* 62*  LABPROT 15.2  --   --   --   INR 1.1  --   --   --   HEPARINUNFRC  --   --   --  0.69  CREATININE 0.71  --   --  0.68  TROPONINIHS 6 212*  --   --     Estimated Creatinine Clearance: 61.7 mL/min (by C-G formula based on SCr of 0.68 mg/dL).   Medical History: Past Medical History:  Diagnosis Date   Anxiety    Cardiac arrhythmia    CHF (congestive heart failure) (HCC)    Colon polyp    Complication of anesthesia    Concussion 11/09/2012   From a fall at home on deck   Depression    postpartum depression after 2nd child   Diabetes mellitus (HCC)    Diverticulosis    Fatty liver    GERD (gastroesophageal reflux disease)    HLD (hyperlipidemia)    Hypertension    Morbid obesity (HCC)    OSA on CPAP    uses CPAP nightly   Osteoarthritis of right knee 07/31/2013   PONV (postoperative nausea and vomiting)    Rosacea    Severe aortic stenosis    Spinal stenosis     Medications:  No current facility-administered medications on file prior to encounter.   Current Outpatient Medications on File Prior to Encounter  Medication Sig Dispense  Refill   acetaminophen  (TYLENOL ) 650 MG CR tablet Take 650 mg by mouth every 8 (eight) hours as needed for pain.     apixaban  (ELIQUIS ) 5 MG TABS tablet Take 1 tablet (5 mg total) by mouth 2 (two) times daily. 180 tablet 1   atenolol  (TENORMIN ) 25 MG tablet Take 1 tablet (25 mg total) by mouth 2 (two) times daily. 180 tablet 3   cetirizine (ZYRTEC) 10 MG tablet Take 10 mg by mouth in the morning.     docusate sodium  (COLACE) 100 MG capsule Take 100 mg by mouth every morning.     DULoxetine  (CYMBALTA ) 60 MG capsule Take 1 capsule (60 mg total) by mouth daily. 180 capsule 1   EPINEPHrine  (EPIPEN  2-PAK) 0.3 mg/0.3 mL IJ SOAJ injection Inject 0.3 mg into the muscle as needed for anaphylaxis. 2 each 1   insulin  regular (NOVOLIN R) 100 units/mL injection Inject into the skin daily per sliding scale. Max 40 units a day. (Patient taking differently: Inject 10 Units into the skin at  bedtime.) 10 mL 11   losartan  (COZAAR ) 25 MG tablet Take 1 tablet (25 mg total) by mouth daily. 90 tablet 2   metFORMIN  (GLUCOPHAGE -XR) 500 MG 24 hr tablet Take 1 tablet (500 mg total) by mouth 2 (two) times daily with a meal. 180 tablet 3   methocarbamol  (ROBAXIN ) 500 MG tablet Take 1 tablet (500 mg total) by mouth every 6 (six) to 8 (eight) hours as needed. 35 tablet 1   mometasone  (NASONEX ) 50 MCG/ACT nasal spray Place 2 sprays into the nose daily. (Patient taking differently: Place 2 sprays into the nose daily as needed (Sinus allergies).) 17 g 5   omeprazole  (PRILOSEC) 40 MG capsule Take 1 capsule (40 mg total) by mouth daily. 90 capsule 2   Pitavastatin  Magnesium  (ZYPITAMAG ) 4 MG TABS Take 1 tablet (4 mg total) by mouth daily. (Patient taking differently: Take 1 tablet by mouth every evening.) 90 tablet 2   SYSTANE COMPLETE PF 0.6 % SOLN Place 1 drop into both eyes in the morning and at bedtime. Instill 1 drop into both eyes two times a day     tirzepatide  (MOUNJARO ) 5 MG/0.5ML Pen Inject 5 mg into the skin once a week. 2 mL  3     Assessment: 78 y.o. female admitted with SVT, h/o Afib and Eliquis  on hold, for heparin .  Last dose of Eliquis  given 7/27 at 6 pm.  aPTT 45 is subtherapeutic with heparin  running at 1000 units/hr. Hgb (11.6) and PLTs (145) are stable. Patient renal function is stable. Per RN, no report of pauses, issues with the line, or signs of bleeding.   7/29 AM update: aPTT sub-therapeutic but trending up  Goal of Therapy:  aPTT 66-102 sec Heparin  level 0.3-0.7 units/ml Monitor platelets by anticoagulation protocol: Yes   Plan:  Increase heparin  to 1350 units/hr Recheck aPTT and heparin  level after 8 hours Monitor daily aPTT, heparin  levels and CBC Monitor for any signs/symptoms of bleeding  Lynwood Mckusick, PharmD, BCPS Clinical Pharmacist Phone: [phone]

## 2023-11-15 NOTE — Discharge Instructions (Signed)
 Information on my medicine - ELIQUIS (apixaban)  This medication education was reviewed with me or my healthcare representative as part of my discharge preparation.    Why was Eliquis prescribed for you? Eliquis was prescribed for you to reduce the risk of forming blood clots that can cause a stroke if you have a medical condition called atrial fibrillation (a type of irregular heartbeat) OR to reduce the risk of a blood clots forming after orthopedic surgery.  What do You need to know about Eliquis ? Take your Eliquis TWICE DAILY - one tablet in the morning and one tablet in the evening with or without food.  It would be best to take the doses about the same time each day.  If you have difficulty swallowing the tablet whole please discuss with your pharmacist how to take the medication safely.  Take Eliquis exactly as prescribed by your doctor and DO NOT stop taking Eliquis without talking to the doctor who prescribed the medication.  Stopping may increase your risk of developing a new clot or stroke.  Refill your prescription before you run out.  After discharge, you should have regular check-up appointments with your healthcare provider that is prescribing your Eliquis.  In the future your dose may need to be changed if your kidney function or weight changes by a significant amount or as you get older.  What do you do if you miss a dose? If you miss a dose, take it as soon as you remember on the same day and resume taking twice daily.  Do not take more than one dose of ELIQUIS at the same time.  Important Safety Information A possible side effect of Eliquis is bleeding. You should call your healthcare provider right away if you experience any of the following: ? Bleeding from an injury or your nose that does not stop. ? Unusual colored urine (red or dark brown) or unusual colored stools (red or black). ? Unusual bruising for unknown reasons. ? A serious fall or if you hit your  head (even if there is no bleeding).  Some medicines may interact with Eliquis and might increase your risk of bleeding or clotting while on Eliquis. To help avoid this, consult your healthcare provider or pharmacist prior to using any new prescription or non-prescription medications, including herbals, vitamins, non-steroidal anti-inflammatory drugs (NSAIDs) and supplements.  This website has more information on Eliquis (apixaban): [email or web address].cy.

## 2023-11-15 NOTE — Discharge Summary (Signed)
 Physician Discharge Summary  Patient ID: Kara Lee MRN: [id number] DOB/AGE: 1946/03/31 78 y.o.  Admit date: 11/14/2023 Discharge date: 11/15/2023  Admission Diagnoses: Septic shock  Paroxysmal atrial fibrillation Lactic acidosis Diabetes Obstructive sleep apnea Pulmonary nodule  Discharge Diagnoses:  Active Problems:   Paroxysmal atrial fibrillation (HCC) -Stable, tachycardia resolved Sepsis-resolved Diabetes -Stable, no changes to meds Hyperlipidemia Aortic stenosis Obstructive sleep apnea History of TAVR  Discharged Condition: good  Hospital Course:  Patient was admitted with hypotension, ongoing palpitations background history of paroxysmal atrial fibrillation management scheduled for ablation that had to be rescheduled.  Was admitted to the hospital with uncontrolled Heart rate, diaphoretic, lightheaded.  Admitting concern of sepsis precipitating hemodynamic instability. Initially hypotensive which resolved with pressors and fluids no ongoing concerns for an infection, antibiotics stopped  Has remained hemodynamically stable  Decision made to discharge home and follow-up  Incidental nodule found on CT-needs follow-up in 6 months  Consults: cardiology-electrophysiology  Significant Diagnostic Studies: CT chest-6 mm lung nodule on the right CT scan of the chest ordered for 6 months from here-  Treatments:  Empiric antibiotics - Discontinued  Fluid resuscitation - Discontinued  Back on usual anticoagulation  Discharge Exam: Blood pressure (!) 83/41, pulse 65, temperature 97.8 F (36.6 C), temperature source Axillary, resp. rate 19, height 5' 2 (1.575 m), weight 86 kg, SpO2 91%. General appearance: alert and cooperative Eyes: negative findings: pupils equal, round, reactive to light and accomodation Neck: no adenopathy, no carotid bruit, no JVD, supple, symmetrical, trachea midline, and thyroid  not enlarged, symmetric, no tenderness/mass/nodules Chest  wall: no tenderness Cardio: S1, S2 normal Extremities: extremities normal, atraumatic, no cyanosis or edema Neurologic: Grossly normal  Disposition: Discharge disposition: 01-Home or Self Care       Discharge Instructions     Call MD for:  persistant nausea and vomiting   Complete by: As directed    Call MD for:  severe uncontrolled pain   Complete by: As directed    Call MD for:  temperature >100.4   Complete by: As directed    Diet - low sodium heart healthy   Complete by: As directed    Diet general   Complete by: As directed    Increase activity slowly   Complete by: As directed       Allergies as of 11/15/2023       Reactions   Bee Venom Itching, Swelling   Cipro  [ciprofloxacin  Hcl] Rash   Glimepiride Rash   Latex Itching, Swelling   Metronidazole  Rash   Shellfish Allergy Itching   Statins Other (See Comments)   Muscle aches   Tape Itching   Bandaids and adhesive tape        Medication List     TAKE these medications    acetaminophen  650 MG CR tablet Commonly known as: TYLENOL  Take 650 mg by mouth every 8 (eight) hours as needed for pain.   atenolol  25 MG tablet Commonly known as: TENORMIN  Take 1 tablet (25 mg total) by mouth 2 (two) times daily.   cetirizine 10 MG tablet Commonly known as: ZYRTEC Take 10 mg by mouth in the morning.   docusate sodium  100 MG capsule Commonly known as: COLACE Take 100 mg by mouth every morning.   DULoxetine  60 MG capsule Commonly known as: Cymbalta  Take 1 capsule (60 mg total) by mouth daily.   Eliquis  5 MG Tabs tablet Generic drug: apixaban  Take 1 tablet (5 mg total) by mouth 2 (two) times daily.   EPINEPHrine   0.3 mg/0.3 mL Soaj injection Commonly known as: EpiPen  2-Pak Inject 0.3 mg into the muscle as needed for anaphylaxis.   HumuLIN  R 100 UNIT/ML injection Generic drug: insulin  regular Inject into the skin daily per sliding scale. Max 40 units a day. What changed:  how much to take when to take  this   losartan  25 MG tablet Commonly known as: COZAAR  Take 1 tablet (25 mg total) by mouth daily.   metFORMIN  500 MG 24 hr tablet Commonly known as: GLUCOPHAGE -XR Take 1 tablet (500 mg total) by mouth 2 (two) times daily with a meal.   methocarbamol  500 MG tablet Commonly known as: ROBAXIN  Take 1 tablet (500 mg total) by mouth every 6 (six) to 8 (eight) hours as needed.   mometasone  50 MCG/ACT nasal spray Commonly known as: Nasonex  Place 2 sprays into the nose daily. What changed:  when to take this reasons to take this   Mounjaro  5 MG/0.5ML Pen Generic drug: tirzepatide  Inject 5 mg into the skin once a week.   omeprazole  40 MG capsule Commonly known as: PRILOSEC Take 1 capsule (40 mg total) by mouth daily.   Systane Complete PF 0.6 % Soln Generic drug: Propylene Glycol (PF) Place 1 drop into both eyes in the morning and at bedtime. Instill 1 drop into both eyes two times a day   Zypitamag  4 MG Tabs Generic drug: Pitavastatin  Magnesium  Take 1 tablet (4 mg total) by mouth daily. What changed:  how much to take when to take this         Signed: Haylen Bellotti A Angalena Cousineau 11/15/2023, 10:27 AM

## 2023-11-15 NOTE — Progress Notes (Signed)
 Lung nodule, needs follow-up

## 2023-11-15 NOTE — Progress Notes (Addendum)
  Patient Name: Kara Lee Date of Encounter: 11/15/2023  Primary Cardiologist: Gordy Bergamo, MD Electrophysiologist: Fonda Kitty, MD  Interval Summary   Kara Lee.   No further SVT/AF episodes. She denies chest pain, chest pressure, palpitations. No SOB or dizziness.   She is anxious about caring for her husband and his driving.    Vital Signs    Vitals:   11/15/23 0700 11/15/23 0714 11/15/23 0800 11/15/23 0900  BP: (!) 114/54  (!) 94/48 (!) 83/41  Pulse: (!) 57 62 71 65  Resp: 14 15 19 19   Temp:  97.8 F (36.6 C)    TempSrc:  Axillary    SpO2: 94% 96% 95% 91%  Weight:      Height:        Intake/Output Summary (Last 24 hours) at 11/15/2023 1014 Last data filed at 11/15/2023 0900 Gross per 24 hour  Intake 1747.59 ml  Output 2100 ml  Net -352.41 ml   Filed Weights   11/14/23 0318 11/15/23 0336  Weight: 85.7 kg 86 kg    Physical Exam    GEN- NAD, Alert and oriented  Lungs- Clear to ausculation bilaterally, normal work of breathing Cardiac- Regular rate and rhythm, no murmurs, rubs or gallops GI- soft, NT, ND, + BS Extremities- no clubbing or cyanosis. No edema  Telemetry    SR 50-60s (personally reviewed)  Hospital Course    Kara Lee is a 78 y.o. 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 Wonda), obesity, GERD, OSA on CPAP admitted for SVT vs aflutter, elevated lactate, hypotension.  Assessment & Plan    #) SVT #) parox AFib No further SVT episodes Off levophed  with stable BP Infectious workup thus far unrevealing Would not restart home atenolol  at this time, consider outpatient OK to transition hep gtt to home eliquis   Will msg EP schedulers to reschedule AF ablation, EPS She has appt next week with Dr. Kitty.    Ok to discharge from EP perspective     For questions or updates, please contact [facility] Please consult [email or web address] for contact info under     Signed, Suzann Riddle, NP   11/15/2023, 10:14 AM    I have seen, examined the patient, and reviewed the above assessment and plan.    Interval:  No acute overnight events.  Weaned off pressors.  No recurrence of tachycardia.  Patient reports feeling relatively well. No new or acute complaints.   General: Well developed, in no acute distress.  Neck: No JVD.  Cardiac: Normal rate, regular rhythm.  Resp: Normal work of breathing.  Ext: No edema.  Neuro: No gross focal deficits.  Psych: Normal affect.   Assessment: MS. Kara Lee is a 78 year old female with past medical history notable for AF, DM, severe AS s/p TAVR, obesity, GERD, OSA who was admitted following episode of palpitations and elevated heart rates.   Plan:  #SVT #pAF - Resume Eliquis .  - We will arrange for outpatient catheter ablation for both SVT and AF.  This had previously been discussed. She had to postpone due to taking care of her husband. Will now reschedule given recurrence.   Fonda Kitty, MD 11/15/2023 8:51 PM

## 2023-11-16 ENCOUNTER — Encounter (HOSPITAL_BASED_OUTPATIENT_CLINIC_OR_DEPARTMENT_OTHER): Payer: Self-pay | Admitting: Radiology

## 2023-11-16 ENCOUNTER — Telehealth: Payer: Self-pay

## 2023-11-16 ENCOUNTER — Ambulatory Visit (HOSPITAL_COMMUNITY): Admit: 2023-11-16 | Admitting: Cardiology

## 2023-11-16 ENCOUNTER — Emergency Department (HOSPITAL_BASED_OUTPATIENT_CLINIC_OR_DEPARTMENT_OTHER)

## 2023-11-16 ENCOUNTER — Emergency Department (HOSPITAL_BASED_OUTPATIENT_CLINIC_OR_DEPARTMENT_OTHER)
Admission: EM | Admit: 2023-11-16 | Discharge: 2023-11-16 | Disposition: A | Discharge status: Home / Self Care | Attending: Emergency Medicine | Admitting: Emergency Medicine

## 2023-11-16 ENCOUNTER — Other Ambulatory Visit: Payer: Self-pay

## 2023-11-16 ENCOUNTER — Encounter (HOSPITAL_COMMUNITY): Payer: Self-pay

## 2023-11-16 DIAGNOSIS — I509 Heart failure, unspecified: Secondary | ICD-10-CM | POA: Insufficient documentation

## 2023-11-16 DIAGNOSIS — I480 Paroxysmal atrial fibrillation: Secondary | ICD-10-CM | POA: Diagnosis not present

## 2023-11-16 DIAGNOSIS — Z91040 Latex allergy status: Secondary | ICD-10-CM | POA: Diagnosis not present

## 2023-11-16 DIAGNOSIS — Z7901 Long term (current) use of anticoagulants: Secondary | ICD-10-CM | POA: Insufficient documentation

## 2023-11-16 DIAGNOSIS — R0602 Shortness of breath: Secondary | ICD-10-CM | POA: Diagnosis not present

## 2023-11-16 LAB — BASIC METABOLIC PANEL WITH GFR
Anion gap: 13 (ref 5–15)
BUN: 14 mg/dL (ref 8–23)
CO2: 21 mmol/L — ABNORMAL LOW (ref 22–32)
Calcium: 9.7 mg/dL (ref 8.9–10.3)
Chloride: 105 mmol/L (ref 98–111)
Creatinine, Ser: 0.81 mg/dL (ref 0.44–1.00)
GFR, Estimated: >60 mL/min
Glucose, Bld: 157 mg/dL — ABNORMAL HIGH (ref 70–99)
Potassium: 4.5 mmol/L (ref 3.5–5.1)
Sodium: 139 mmol/L (ref 135–145)

## 2023-11-16 LAB — CBC
HCT: 35.6 % — ABNORMAL LOW (ref 36.0–46.0)
Hemoglobin: 11.5 g/dL — ABNORMAL LOW (ref 12.0–15.0)
MCH: 29.8 pg (ref 26.0–34.0)
MCHC: 32.3 g/dL (ref 30.0–36.0)
MCV: 92.2 fL (ref 80.0–100.0)
Platelets: 147 K/uL — ABNORMAL LOW (ref 150–400)
RBC: 3.86 MIL/uL — ABNORMAL LOW (ref 3.87–5.11)
RDW: 14.2 % (ref 11.5–15.5)
WBC: 15.1 K/uL — ABNORMAL HIGH (ref 4.0–10.5)
nRBC: 0 % (ref 0.0–0.2)

## 2023-11-16 LAB — MAGNESIUM: Magnesium: 1.9 mg/dL (ref 1.7–2.4)

## 2023-11-16 LAB — URINE CULTURE: Culture: 10000 — AB

## 2023-11-16 SURGERY — ATRIAL FIBRILLATION ABLATION
Anesthesia: General

## 2023-11-16 MED ORDER — SODIUM CHLORIDE 0.9 % IV BOLUS
500.0000 mL | Freq: Once | INTRAVENOUS | Status: AC
  Administered 2023-11-16: 500 mL via INTRAVENOUS

## 2023-11-16 NOTE — ED Notes (Signed)

## 2023-11-16 NOTE — ED Notes (Signed)
 Marcey, RT to ambulate patient with monitor.

## 2023-11-16 NOTE — ED Notes (Signed)
 Provider Towana MD made aware of patient being in room.

## 2023-11-16 NOTE — Telephone Encounter (Signed)
 Pt called in to sch ablation. I told her she can discuss at appt 11/22/23 but she said Dr. Kennyth told her someone would call her before then.

## 2023-11-16 NOTE — ED Notes (Signed)
 Relocated BP cuff to R forearm. Pt has strong bounding radials and I do not trust the automatic cuff on the upper arms. Bilateral upper arms showing hypotension. Will confirm with manual pressure as time allows.

## 2023-11-16 NOTE — Telephone Encounter (Signed)
 Haven't seen paperwork as of yet.

## 2023-11-16 NOTE — ED Triage Notes (Signed)
 Pt endorses being discharged from [facility] ICU yesterday and states she got home and had a lot to do. She states that she feels she has went back into afib and has shortness of breath , , clamminess, and dizziness.

## 2023-11-16 NOTE — Telephone Encounter (Signed)
 Copied from CRM [id number]. Topic: General - Other >> Nov 16, 2023 12:54 PM Aisha D wrote: Reason for CRM: Leita with [facility] is calling to verify if the office received the faxed sent on 7/23. She stated that she will be faxing over the requested document again today and would like to have it signed and faxed back.

## 2023-11-16 NOTE — ED Notes (Signed)
 Pt came in after being discharged from the ICU yesterday. The pt believed she was rate controlled last night in the 90s but was unsure of what her actual rate was. The pt took her Cardizem  30mg  prior to comingin because she felt her HR was up, and it's now rate controlled.

## 2023-11-16 NOTE — Transitions of Care (Post Inpatient/ED Visit) (Signed)
   11/16/2023  Name: Kara Lee MRN: [id number] DOB: [birth year]  Today's TOC FU Call Status:    Attempted to reach the patient regarding the most recent Inpatient/ED visit.  Follow Up Plan: No further outreach attempts will be made at this time. We have been unable to contact the patient.  Medford Balboa, BSN, RN [place]  [facility] - [employer] [facility] RN Care Manager [phone]

## 2023-11-16 NOTE — ED Notes (Signed)
 Patient ambulated to restroom on room air with portable cardiac monitor, HR 75-63max, SpO 88% at lowest, denies increased work of breathing/SHOB.  SpO2 recovered quickly 97%.

## 2023-11-16 NOTE — Discharge Instructions (Signed)
 You were seen in the emergency department for elevated heart rate shortness of breath.  You were back in atrial fibrillation but the medication you took seems to have worked and and are currently back in sinus rhythm.  Cardiology did not feel you needed to be admitted to the hospital at this point.  They recommend continue your regular medications and follow-up with Dr. Kennyth in the EP clinic as scheduled.  Return to the emergency department if any worsening or concerning symptoms.

## 2023-11-16 NOTE — ED Notes (Signed)
 EDP aware, will complete bolus before ambulating patient.

## 2023-11-16 NOTE — ED Provider Notes (Signed)
 [place] EM[facility] DEPARTMENT AT [facility] Provider Note   CSN: [id number] Arrival date & time: 11/16/23  1605     Patient presents with: Atrial Fibrillation and Shortness of Breath   Kara Lee is a 78 y.o. female.  She has a history of aortic stenosis TAVR atrial fibrillation CHF.  Was just discharged yesterday after being admitted for A-fib with RVR and hypotension.  Was in ICU due to pressor issues.  Discharged yesterday and plan was to follow-up with EP as an outpatient to discuss ablation.  She said she is under a lot of stress at home and husband had not done anything since she was admitted.  She spent a lot of time cleaning and was not resting like she was supposed to.  Today she felt her heart rate rise up to 150s again. Short of breath and clamy.  Took an pill (?diltiazem  30) and it did not help and tried to call cardiology but could not get through to the office.  Drove her self here.  She is feeling generally stressed although denies any chest pain.  No nausea vomiting diarrhea.  Did have a cough last night and feels a little short of breath here. Does feel like her heart rate is slower now.   {Add pertinent medical, surgical, social history, OB history to YEP:67052} The history is provided by the patient.  Atrial Fibrillation This is a recurrent problem. The current episode started 1 to 2 hours ago. The problem occurs constantly. The problem has not changed since onset.Associated symptoms include shortness of breath. Pertinent negatives include no chest pain, no abdominal pain and no headaches. Nothing aggravates the symptoms. Nothing relieves the symptoms. Treatments tried: cardizem  pill. The treatment provided no relief.  Shortness of Breath Associated symptoms: no abdominal pain, no chest pain, no fever and no headaches        Prior to Admission medications   Medication Sig Start Date End Date Taking? Authorizing Provider  acetaminophen  (TYLENOL ) 650 MG CR  tablet Take 650 mg by mouth every 8 (eight) hours as needed for pain.    [provider]  apixaban  (ELIQUIS ) 5 MG TABS tablet Take 1 tablet (5 mg total) by mouth 2 (two) times daily. 07/18/23 01/14/24  Frann Mabel Mt, DO  atenolol  (TENORMIN ) 25 MG tablet Take 1 tablet (25 mg total) by mouth 2 (two) times daily. 01/05/23   Ladona Heinz, MD  cetirizine (ZYRTEC) 10 MG tablet Take 10 mg by mouth in the morning.    [provider]  docusate sodium  (COLACE) 100 MG capsule Take 100 mg by mouth every morning.    [provider]  DULoxetine  (CYMBALTA ) 60 MG capsule Take 1 capsule (60 mg total) by mouth daily. 08/15/23   Frann Mabel Mt, DO  EPINEPHrine  (EPIPEN  2-PAK) 0.3 mg/0.3 mL IJ SOAJ injection Inject 0.3 mg into the muscle as needed for anaphylaxis. 03/15/22   Marinda Rocky SAILOR, MD  insulin  regular (NOVOLIN R) 100 units/mL injection Inject into the skin daily per sliding scale. Max 40 units a day. Patient taking differently: Inject 10 Units into the skin at bedtime. 06/03/23   Antonio Cyndee Jamee JONELLE, DO  losartan  (COZAAR ) 25 MG tablet Take 1 tablet (25 mg total) by mouth daily. 04/11/23   Frann Mabel Mt, DO  metFORMIN  (GLUCOPHAGE -XR) 500 MG 24 hr tablet Take 1 tablet (500 mg total) by mouth 2 (two) times daily with a meal. 02/14/23   Wendling, Mabel Mt, DO  methocarbamol  (ROBAXIN )  500 MG tablet Take 1 tablet (500 mg total) by mouth every 6 (six) to 8 (eight) hours as needed. 04/11/23   Frann Mabel Mt, DO  mometasone  (NASONEX ) 50 MCG/ACT nasal spray Place 2 sprays into the nose daily. Patient taking differently: Place 2 sprays into the nose daily as needed (Sinus allergies). 10/07/23   Frann Mabel Mt, DO  omeprazole  (PRILOSEC) 40 MG capsule Take 1 capsule (40 mg total) by mouth daily. 02/04/23   Frann Mabel Mt, DO  Pitavastatin  Magnesium  (ZYPITAMAG ) 4 MG TABS Take 1 tablet (4 mg total) by mouth daily. Patient taking differently: Take  1 tablet by mouth every evening. 04/26/23   Ladona Heinz, MD  SYSTANE COMPLETE PF 0.6 % SOLN Place 1 drop into both eyes in the morning and at bedtime. Instill 1 drop into both eyes two times a day    [provider]  tirzepatide  (MOUNJARO ) 5 MG/0.5ML Pen Inject 5 mg into the skin once a week. 10/18/23 02/07/24  Frann Mabel Mt, DO    Allergies: Bee venom, Cipro  [ciprofloxacin  hcl], Glimepiride, Latex, Metronidazole , Shellfish allergy, Statins, and Tape    Review of Systems  Constitutional:  Negative for fever.  Eyes:  Negative for visual disturbance.  Respiratory:  Positive for shortness of breath.   Cardiovascular:  Negative for chest pain.  Gastrointestinal:  Negative for abdominal pain.  Genitourinary:  Positive for frequency. Negative for dysuria.  Neurological:  Negative for headaches.    Updated Vital Signs BP (!) 89/62 (BP Location: Left Arm)   Pulse (!) 157   Temp 98.1 F (36.7 C)   Resp 20   Ht 5' 2 (1.575 m)   Wt 85.7 kg   SpO2 94%   BMI 34.57 kg/m   Physical Exam Vitals and nursing note reviewed.  Constitutional:      General: She is not in acute distress.    Appearance: Normal appearance. She is well-developed.  HENT:     Head: Normocephalic and atraumatic.  Eyes:     Conjunctiva/sclera: Conjunctivae normal.  Cardiovascular:     Rate and Rhythm: Normal rate and regular rhythm.     Heart sounds: No murmur heard. Pulmonary:     Effort: Pulmonary effort is normal. No respiratory distress.     Breath sounds: Normal breath sounds. No stridor. No wheezing.  Abdominal:     Palpations: Abdomen is soft.     Tenderness: There is no abdominal tenderness. There is no guarding or rebound.  Musculoskeletal:        General: No tenderness or deformity. Normal range of motion.     Cervical back: Neck supple.     Right lower leg: No tenderness.     Left lower leg: No tenderness.  Skin:    General: Skin is warm and dry.     Capillary Refill: Capillary  refill takes less than 2 seconds.  Neurological:     General: No focal deficit present.     Mental Status: She is alert.     GCS: GCS eye subscore is 4. GCS verbal subscore is 5. GCS motor subscore is 6.     (all labs ordered are listed, but only abnormal results are displayed) Labs Reviewed  BASIC METABOLIC PANEL WITH GFR  CBC  MAGNESIUM     EKG: None  Radiology: [facility] Chest Port 1 View Result Date: 11/15/2023 EXAM: 1 VIEW XRAY OF THE CHEST 11/15/2023 05:25:39 AM COMPARISON: 11/14/2023 status post TAVR. CLINICAL HISTORY: Respiratory failure. Hypotension. Evaluate for pneumothorax. FINDINGS: LUNGS  AND PLEURA: No focal pulmonary opacity. No pulmonary edema. No pleural effusion. No pneumothorax. HEART AND MEDIASTINUM: No acute abnormality of the cardiac and mediastinal silhouettes. Aortic atherosclerotic calcifications. BONES AND SOFT TISSUES: No acute osseous abnormality. IMPRESSION: 1. No pneumothorax. 2. Aortic atherosclerotic calcifications. Electronically signed by: Waddell Calk MD 11/15/2023 05:59 AM EDT RP Workstation: [id number]    {Document cardiac monitor, telemetry assessment procedure when appropriate:32947} Procedures   Medications Ordered in the ED - No data to display  Clinical Course as of 11/16/23 1643  Wed Nov 16, 2023  1629 Patient's heart rate in the 90s with frequent extra beats, blood pressure low 100.  Awake and alert. [MB]    Clinical Course User Index [MB] Towana Ozell BROCKS, MD   {Click here for ABCD2, HEART and other calculators REFRESH Note before signing:1}                              Medical Decision Making Amount and/or Complexity of Data Reviewed Labs: ordered. Radiology: ordered.   This patient complains of ***; this involves an extensive number of treatment Options and is a complaint that carries with it a high risk of complications and morbidity. The differential includes ***  I ordered, reviewed and interpreted labs, which included *** I  ordered medication *** and reviewed PMP when indicated. I ordered imaging studies which included *** and I independently    visualized and interpreted imaging which showed *** Additional history obtained from *** Previous records obtained and reviewed *** I consulted *** and discussed lab and imaging findings and discussed disposition.  Cardiac monitoring reviewed, *** Social determinants considered, *** Critical Interventions: ***  After the interventions stated above, I reevaluated the patient and found *** Admission and further testing considered, ***   {Document critical care time when appropriate  Document review of labs and clinical decision tools ie CHADS2VASC2, etc  Document your independent review of radiology images and any outside records  Document your discussion with family members, caretakers and with consultants  Document social determinants of health affecting pt's care  Document your decision making why or why not admission, treatments were needed:32947:::1}   Final diagnoses:  None    ED Discharge Orders     None

## 2023-11-19 LAB — CULTURE, BLOOD (ROUTINE X 2)
Culture: NO GROWTH
Culture: NO GROWTH
Special Requests: ADEQUATE

## 2023-11-19 MED FILL — Metformin HCl Tab ER 24HR 500 MG: ORAL | 90 days supply | Qty: 180 | Fill #1 | Status: AC

## 2023-11-22 ENCOUNTER — Ambulatory Visit: Attending: Cardiovascular Disease | Admitting: Cardiology

## 2023-11-22 ENCOUNTER — Other Ambulatory Visit: Payer: Self-pay

## 2023-11-22 ENCOUNTER — Telehealth: Payer: Self-pay | Admitting: Family Medicine

## 2023-11-22 ENCOUNTER — Encounter: Payer: Self-pay | Admitting: Cardiology

## 2023-11-22 ENCOUNTER — Other Ambulatory Visit (HOSPITAL_BASED_OUTPATIENT_CLINIC_OR_DEPARTMENT_OTHER): Payer: Self-pay

## 2023-11-22 VITALS — BP 116/71 | HR 66 | Ht 62.0 in | Wt 195.3 lb

## 2023-11-22 DIAGNOSIS — I471 Supraventricular tachycardia, unspecified: Secondary | ICD-10-CM

## 2023-11-22 DIAGNOSIS — I480 Paroxysmal atrial fibrillation: Secondary | ICD-10-CM

## 2023-11-22 DIAGNOSIS — Z952 Presence of prosthetic heart valve: Secondary | ICD-10-CM

## 2023-11-22 DIAGNOSIS — D6869 Other thrombophilia: Secondary | ICD-10-CM

## 2023-11-22 DIAGNOSIS — I10 Essential (primary) hypertension: Secondary | ICD-10-CM

## 2023-11-22 DIAGNOSIS — G4733 Obstructive sleep apnea (adult) (pediatric): Secondary | ICD-10-CM

## 2023-11-22 MED ORDER — DILTIAZEM HCL 30 MG PO TABS
30.0000 mg | ORAL_TABLET | Freq: Four times a day (QID) | ORAL | 3 refills | Status: AC | PRN
  Filled 2023-11-22: qty 45, 12d supply, fill #0
  Filled 2024-04-06: qty 45, 12d supply, fill #1

## 2023-11-22 NOTE — Progress Notes (Unsigned)
 Electrophysiology Office Note:   Date:  11/23/2023  ID:  Kara Lee, DOB February 17, 1946, MRN [id number]  Primary Cardiologist: Gordy Bergamo, MD Electrophysiologist: Fonda Kitty, MD      History of Present Illness:   Kara Lee is a 78 y.o. female with h/o DM, HLD, AS s/p TAVR, atrial fibrillation who is being seen today for evaluation of her atrial fibrillation.   She has a long-standing history of atrial fibrillation, first diagnosed approximately 25 years ago. Episodes occur sporadically without a clear trigger and have increased in frequency over the past six months. She reports episodes of increased heart rate, confirmed by her watch, but does not have a device that specifically detects atrial fibrillation.Episodes typically last 10-15 minutes, but in the past, she has required medical intervention, including medication to restore normal rhythm. In September 2024, she experienced an episode where her heart rate reached 120-150 bpm, necessitating EMS transport to the ER. She was treated with amiodarone, which successfully restored her normal rhythm. She is not keen on long-term medication, as she has concerns about the side effects of medications, particularly fatigue, and is interested in options that would minimize her medication burden. Otherwise doing relatively well. No new or acute complaints.   Discussed the use of AI scribe software for clinical note transcription with the patient, who gave verbal consent to proceed.  History of Present Illness SEAIRRA Lee is a 78 year old female with atrial fibrillation and supraventricular tachycardia who presents for scheduling of an ablation procedure.  She presented to the ED on 7/28 with palpitations.  She experiences episodes of heart racing, pounding, and skipping. She has a history of atrial fibrillation and supraventricular tachycardia. She was recently hospitalized and returned to the emergency department the day after discharge due to these  symptoms. An EKG performed at that time was normal.  She is currently taking atenolol  regularly to maintain normal heart rhythm. Additionally, she has diltiazem  30 mg available to take as needed during episodes, which takes about 30 minutes to take effect.  She is very stressed about her husband's current medical conditions, as he had a stroke earlier this year and has not yet followed up with a neurologist.  Review of systems complete and found to be negative unless listed in HPI.   EP Information / Studies Reviewed:    EKG is not ordered today. EKG from 11/16/23 reviewed which showed SR with IVCD.     EKG 11/14/23: SVT   EKG 12/2022: AF     Echo 03/2023:   1. Left ventricular ejection fraction, by estimation, is 60 to 65%. The  left ventricle has normal function. The left ventricle has no regional  wall motion abnormalities. There is moderate left ventricular hypertrophy.  Left ventricular diastolic function   could not be evaluated due to mitral annular calcification (moderate or  greater), but hemodynamics suggestive of grade 2 diastolic dysfunction.  Elevated left atrial pressure. The E/e' is 29.   2. Right ventricular systolic function is normal. The right ventricular  size is normal.   3. Left atrial size was mildly dilated.   4. The mitral valve is degenerative. Mild mitral valve regurgitation. No  evidence of mitral stenosis. Moderate mitral annular calcification.   5. 26 mm Edwards SAPIEN TAVR valve is well-seated, no evidence of  dehiscence, no aortic valvular regurgitation, no obvious valvular  stenosis, no perivalvular leak.   Risk Assessment/Calculations:    CHA2DS2-VASc Score = 4   This indicates a 4.8%  annual risk of stroke. The patient's score is based upon: CHF History: 0 HTN History: 0 Diabetes History: 1 Stroke History: 0 Vascular Disease History: 1 Age Score: 1 Gender Score: 1             Physical Exam:   VS:  BP 116/71   Pulse 66   Ht 5' 2  (1.575 m)   Wt 195 lb 4.8 oz (88.6 kg)   SpO2 99%   BMI 35.72 kg/m    Wt Readings from Last 3 Encounters:  11/22/23 195 lb 4.8 oz (88.6 kg)  11/16/23 189 lb (85.7 kg)  11/15/23 189 lb 9.5 oz (86 kg)     GEN: Well nourished, well developed in no acute distress NECK: No JVD CARDIAC: Normal rate, regular rhythm RESPIRATORY:  Clear to auscultation without rales, wheezing or rhonchi  ABDOMEN: Soft, non-distended EXTREMITIES:  No edema; No deformity   ASSESSMENT AND PLAN:    #Paroxysmal atrial fibrillation, symptomatic:  #Secondary hypercoagulable state due to AF: CHADSVASC score of 4.  -Discussed treatment options today for AF including antiarrhythmic drug therapy and ablation. Discussed risks, recovery and likelihood of success with each treatment strategy. Risk, benefits, and alternatives to EP study and ablation for afib were discussed. These risks include but are not limited to stroke, bleeding, vascular damage, tamponade, perforation, damage to the esophagus, lungs, phrenic nerve and other structures, pulmonary vein stenosis, worsening renal function, coronary vasospasm and death.  Discussed potential need for repeat ablation procedures and antiarrhythmic drugs after an initial ablation. The patient understands these risk and wishes to proceed.  We will therefore proceed with catheter ablation at the next available time.  Carto, ICE, anesthesia are requested for the procedure.  Will also obtain CT PV protocol prior to the procedure to exclude LAA thrombus and further evaluate atrial anatomy. -Continue atenolol  25mg  BID. Diltiazem  30mg  every 4 hours as needed for sustained heart rates above 100bpm.  -Continue Eliquis  5mg  BID.   #SVT: -Therapeutic strategies for supraventricular tachycardia including medicine and ablation were discussed in detail with the patient today. Risk, benefits, and alternatives to EP study and radiofrequency ablation were also discussed in detail today. These risks  include but are not limited to stroke, bleeding, vascular damage, tamponade, perforation, damage to the heart and other structures, AV block requiring pacemaker, worsening renal function, and death. The patient understands these risk and wishes to proceed.  We will therefore proceed with catheter ablation at the next available time. -Continue atenolol  25mg  BID. Diltiazem  30mg  every 4 hours as needed for sustained heart rates above 100bpm.   #OSA - CPAP use  #AS s/p TAVR: Appears well compensated.  - Continue follow up with general cardiology.  - Prophylactic antibiotics at time of ablation.   #Hypertension - At goal today.  Recommend checking blood pressures 1-2 times per week at home and recording the values.  Recommend bringing these recordings to the primary care physician.  Follow up with Dr. Kennyth 3 months after ablation.    Signed, Fonda Kennyth, MD

## 2023-11-22 NOTE — Patient Instructions (Signed)
 Medication Instructions:  Your physician recommends that you continue on your current medications as directed. Please refer to the Current Medication list given to you today.  *If you need a refill on your cardiac medications before your next appointment, please call your pharmacy*  Testing/Procedures: Ablation Your physician has recommended that you have an ablation. Catheter ablation is a medical procedure used to treat some cardiac arrhythmias (irregular heartbeats). During catheter ablation, a long, thin, flexible tube is put into a blood vessel in your groin (upper thigh), or neck. This tube is called an ablation catheter. It is then guided to your heart through the blood vessel. Radio frequency waves destroy small areas of heart tissue where abnormal heartbeats may cause an arrhythmia to start.   What To Expect:  Labs: you will need to have lab work drawn within 30 days of your procedure. Please go to any [facility] location to have these drawn - no appointment is needed. You will receive procedure instructions either through MyChart or in the mail 4-6 week prior to your procedure.  After your procedure we recommend no driving for 3 days, no lifting over 10 lbs for 5 days, and no work or strenuous activity for 7 days.  Please contact our office at [phone] if you have any questions.    Follow-Up: We will contact you to schedule your post-procedure appointments.

## 2023-11-22 NOTE — Telephone Encounter (Unsigned)
 Copied from CRM #[id number]. Topic: General - Other >> Nov 22, 2023  3:15 PM Gennette ORN wrote: Reason for CRM: Tinnie OLIPHANT [phone] is calling to follow up on the form that was 11/16/23.

## 2023-11-23 ENCOUNTER — Other Ambulatory Visit (HOSPITAL_BASED_OUTPATIENT_CLINIC_OR_DEPARTMENT_OTHER): Payer: Self-pay

## 2023-11-23 ENCOUNTER — Ambulatory Visit (INDEPENDENT_AMBULATORY_CARE_PROVIDER_SITE_OTHER): Admitting: Family Medicine

## 2023-11-23 ENCOUNTER — Encounter: Payer: Self-pay | Admitting: Family Medicine

## 2023-11-23 VITALS — BP 122/78 | HR 70 | Temp 98.0°F | Resp 16 | Ht 62.0 in | Wt 192.0 lb

## 2023-11-23 DIAGNOSIS — E1169 Type 2 diabetes mellitus with other specified complication: Secondary | ICD-10-CM | POA: Diagnosis not present

## 2023-11-23 DIAGNOSIS — Z1231 Encounter for screening mammogram for malignant neoplasm of breast: Secondary | ICD-10-CM

## 2023-11-23 DIAGNOSIS — Z6835 Body mass index (BMI) 35.0-35.9, adult: Secondary | ICD-10-CM

## 2023-11-23 DIAGNOSIS — R911 Solitary pulmonary nodule: Secondary | ICD-10-CM | POA: Diagnosis not present

## 2023-11-23 DIAGNOSIS — D72829 Elevated white blood cell count, unspecified: Secondary | ICD-10-CM

## 2023-11-23 DIAGNOSIS — E669 Obesity, unspecified: Secondary | ICD-10-CM

## 2023-11-23 MED ORDER — TIRZEPATIDE 7.5 MG/0.5ML ~~LOC~~ SOAJ
7.5000 mg | SUBCUTANEOUS | 3 refills | Status: DC
  Filled 2023-11-23: qty 2, 28d supply, fill #0
  Filled 2024-01-06: qty 2, 28d supply, fill #1
  Filled 2024-02-02: qty 2, 28d supply, fill #2
  Filled 2024-03-02: qty 2, 28d supply, fill #3

## 2023-11-23 NOTE — Progress Notes (Signed)
 Chief Complaint  Patient presents with   Hospitalization Follow-up    Follow Up    Subjective: Patient is a 78 y.o. female here for hosp f/u.  Patient was admitted to [facility] ICU on 11/14/2023 and discharged in 11/15/2023.  She had hypotension requiring pressors.  She was started on antibiotics and fluids.  She rebounded quickly. Antibiotics and fluids were stopped, she was taken off of pressors.  She has not had any symptoms since starting.  She had a CT scan showing a 6 mm nodule in the right lung base.  She is not a smoker.  She has no shortness of breath or coughing.  Follow-up recommended.  Patient has a history of diabetes.  Sugars been low but higher and her appetite is increased.  She is compliant with Mounjaro  5 mg weekly.  She is requesting an increase in dosage.  Past Medical History:  Diagnosis Date   Anxiety    Cardiac arrhythmia    CHF (congestive heart failure) (HCC)    Colon polyp    Complication of anesthesia    Concussion 11/09/2012   From a fall at home on deck   Depression    postpartum depression after 2nd child   Diabetes mellitus (HCC)    Diverticulosis    Fatty liver    GERD (gastroesophageal reflux disease)    HLD (hyperlipidemia)    Hypertension    Morbid obesity (HCC)    OSA on CPAP    uses CPAP nightly   Osteoarthritis of right knee 07/31/2013   PONV (postoperative nausea and vomiting)    Rosacea    Severe aortic stenosis    Spinal stenosis     Objective: BP 122/78 (BP Location: Left Arm, Patient Position: Sitting)   Pulse 70   Temp 98 F (36.7 C) (Oral)   Resp 16   Ht 5' 2 (1.575 m)   Wt 192 lb (87.1 kg)   SpO2 99%   BMI 35.12 kg/m  General: Awake, appears stated age Heart: RRR, no LE edema Lungs: CTAB, no rales, wheezes or rhonchi. No accessory muscle use Psych: Age appropriate judgment and insight, normal affect and mood  Assessment and Plan: Lung nodule seen on imaging study - Plan: CT Chest Wo Contrast  Leukocytosis,  unspecified type - Plan: CBC  Type 2 diabetes mellitus with obesity (HCC) - Plan: tirzepatide  (MOUNJARO ) 7.5 MG/0.5ML Pen  Encounter for screening mammogram for malignant neoplasm of breast  CT chest without contrast ordered to be done in 6 months.  Discussed the results which she had not heard of until today. Hospital issues have resolved.  Will follow-up on leukocytosis noted last week but this is likely due to steroids. Chronic, not controlled.  Increase Mounjaro  from 5 mg weekly to 7.5 mg weekly.  Counseled on diet and exercise. We will set up for mammogram and bone density scanning later in the year. The patient voiced understanding and agreement to the plan.  Mabel [place], DO 11/23/23  4:40 PM

## 2023-11-24 ENCOUNTER — Ambulatory Visit: Payer: Self-pay | Admitting: Family Medicine

## 2023-11-24 LAB — CBC
HCT: 38 % (ref 36.0–46.0)
Hemoglobin: 12.3 g/dL (ref 12.0–15.0)
MCHC: 32.4 g/dL (ref 30.0–36.0)
MCV: 92.1 fl (ref 78.0–100.0)
Platelets: 253 K/uL (ref 150.0–400.0)
RBC: 4.13 Mil/uL (ref 3.87–5.11)
RDW: 14.5 % (ref 11.5–15.5)
WBC: 10.1 K/uL (ref 4.0–10.5)

## 2023-11-25 ENCOUNTER — Encounter: Payer: Self-pay | Admitting: Family Medicine

## 2023-11-29 ENCOUNTER — Telehealth: Payer: Self-pay

## 2023-11-29 NOTE — Telephone Encounter (Signed)
 Attempted to call patient to schedule an ablation with Dr. Kennyth. Unable to leave voicemail.

## 2023-11-30 ENCOUNTER — Encounter: Payer: Self-pay | Admitting: Family Medicine

## 2023-12-01 DIAGNOSIS — G4733 Obstructive sleep apnea (adult) (pediatric): Secondary | ICD-10-CM | POA: Diagnosis not present

## 2023-12-01 NOTE — Progress Notes (Signed)
 Hypotension related to septic shock

## 2023-12-06 ENCOUNTER — Ambulatory Visit: Admitting: Cardiology

## 2023-12-08 NOTE — Progress Notes (Signed)
 Patient treated for septic shock, probable urinary tract infection-no organisms found.  Required pressors to maintain MAP greater than 65

## 2023-12-08 NOTE — Progress Notes (Signed)
 Pseudomonas aeruginosa UTI leading to sepsis and septic shock

## 2023-12-13 LAB — BASIC METABOLIC PANEL WITH GFR
BUN/Creatinine Ratio: 18 (ref 12–28)
BUN: 12 mg/dL (ref 8–27)
CO2: 21 mmol/L (ref 20–29)
Calcium: 9.4 mg/dL (ref 8.7–10.3)
Chloride: 103 mmol/L (ref 96–106)
Creatinine, Ser: 0.67 mg/dL (ref 0.57–1.00)
Glucose: 106 mg/dL — AB (ref 70–99)
Potassium: 4.6 mmol/L (ref 3.5–5.2)
Sodium: 138 mmol/L (ref 134–144)
eGFR: 91 mL/min/1.73 (ref >59)

## 2023-12-13 LAB — CBC
Hematocrit: 38.6 % (ref 34.0–46.6)
Hemoglobin: 12.3 g/dL (ref 11.1–15.9)
MCH: 29.9 pg (ref 26.6–33.0)
MCHC: 31.9 g/dL (ref 31.5–35.7)
MCV: 94 fL (ref 79–97)
Platelets: 179 x10E3/uL (ref 150–450)
RBC: 4.11 x10E6/uL (ref 3.77–5.28)
RDW: 12.8 % (ref 11.7–15.4)
WBC: 11 x10E3/uL — ABNORMAL HIGH (ref 3.4–10.8)

## 2023-12-16 ENCOUNTER — Other Ambulatory Visit: Payer: Self-pay

## 2023-12-16 ENCOUNTER — Encounter (HOSPITAL_COMMUNITY): Payer: Self-pay

## 2023-12-16 ENCOUNTER — Emergency Department (HOSPITAL_COMMUNITY)

## 2023-12-16 ENCOUNTER — Emergency Department (HOSPITAL_COMMUNITY)
Admission: EM | Admit: 2023-12-16 | Discharge: 2023-12-16 | Disposition: A | Discharge status: Home / Self Care | Attending: Emergency Medicine | Admitting: Emergency Medicine

## 2023-12-16 DIAGNOSIS — Z96651 Presence of right artificial knee joint: Secondary | ICD-10-CM | POA: Insufficient documentation

## 2023-12-16 DIAGNOSIS — Z952 Presence of prosthetic heart valve: Secondary | ICD-10-CM | POA: Insufficient documentation

## 2023-12-16 DIAGNOSIS — H538 Other visual disturbances: Secondary | ICD-10-CM | POA: Insufficient documentation

## 2023-12-16 DIAGNOSIS — I6529 Occlusion and stenosis of unspecified carotid artery: Secondary | ICD-10-CM | POA: Diagnosis not present

## 2023-12-16 DIAGNOSIS — H539 Unspecified visual disturbance: Secondary | ICD-10-CM | POA: Diagnosis not present

## 2023-12-16 DIAGNOSIS — I110 Hypertensive heart disease with heart failure: Secondary | ICD-10-CM | POA: Diagnosis not present

## 2023-12-16 DIAGNOSIS — I10 Essential (primary) hypertension: Secondary | ICD-10-CM | POA: Diagnosis not present

## 2023-12-16 DIAGNOSIS — I509 Heart failure, unspecified: Secondary | ICD-10-CM | POA: Insufficient documentation

## 2023-12-16 DIAGNOSIS — E119 Type 2 diabetes mellitus without complications: Secondary | ICD-10-CM | POA: Diagnosis not present

## 2023-12-16 DIAGNOSIS — I700 Atherosclerosis of aorta: Secondary | ICD-10-CM | POA: Diagnosis not present

## 2023-12-16 LAB — I-STAT CHEM 8, ED
BUN: 15 mg/dL (ref 8–23)
Calcium, Ion: 1.23 mmol/L (ref 1.15–1.40)
Chloride: 106 mmol/L (ref 98–111)
Creatinine, Ser: 0.6 mg/dL (ref 0.44–1.00)
Glucose, Bld: 108 mg/dL — ABNORMAL HIGH (ref 70–99)
HCT: 36 % (ref 36.0–46.0)
Hemoglobin: 12.2 g/dL (ref 12.0–15.0)
Potassium: 4.3 mmol/L (ref 3.5–5.1)
Sodium: 141 mmol/L (ref 135–145)
TCO2: 22 mmol/L (ref 22–32)

## 2023-12-16 LAB — BASIC METABOLIC PANEL WITH GFR
Anion gap: 10 (ref 5–15)
BUN: 14 mg/dL (ref 8–23)
CO2: 24 mmol/L (ref 22–32)
Calcium: 9.4 mg/dL (ref 8.9–10.3)
Chloride: 105 mmol/L (ref 98–111)
Creatinine, Ser: 0.56 mg/dL (ref 0.44–1.00)
GFR, Estimated: >60 mL/min (ref >60)
Glucose, Bld: 110 mg/dL — ABNORMAL HIGH (ref 70–99)
Potassium: 4.3 mmol/L (ref 3.5–5.1)
Sodium: 139 mmol/L (ref 135–145)

## 2023-12-16 LAB — CBC WITH DIFFERENTIAL/PLATELET
Abs Immature Granulocytes: 0.03 K/uL (ref 0.00–0.07)
Basophils Absolute: 0.1 K/uL (ref 0.0–0.1)
Basophils Relative: 1 %
Eosinophils Absolute: 0.2 K/uL (ref 0.0–0.5)
Eosinophils Relative: 2 %
HCT: 36.8 % (ref 36.0–46.0)
Hemoglobin: 11.5 g/dL — ABNORMAL LOW (ref 12.0–15.0)
Immature Granulocytes: 0 %
Lymphocytes Relative: 31 %
Lymphs Abs: 2.9 K/uL (ref 0.7–4.0)
MCH: 30 pg (ref 26.0–34.0)
MCHC: 31.3 g/dL (ref 30.0–36.0)
MCV: 96.1 fL (ref 80.0–100.0)
Monocytes Absolute: 0.7 K/uL (ref 0.1–1.0)
Monocytes Relative: 7 %
Neutro Abs: 5.5 K/uL (ref 1.7–7.7)
Neutrophils Relative %: 59 %
Platelets: 187 K/uL (ref 150–400)
RBC: 3.83 MIL/uL — ABNORMAL LOW (ref 3.87–5.11)
RDW: 13.2 % (ref 11.5–15.5)
WBC: 9.4 K/uL (ref 4.0–10.5)
nRBC: 0 % (ref 0.0–0.2)

## 2023-12-16 LAB — SEDIMENTATION RATE: Sed Rate: 47 mm/h — ABNORMAL HIGH (ref 0–22)

## 2023-12-16 LAB — C-REACTIVE PROTEIN: CRP: 0.5 mg/dL (ref <1.0)

## 2023-12-16 MED ORDER — PREDNISONE 20 MG PO TABS
60.0000 mg | ORAL_TABLET | Freq: Once | ORAL | Status: AC
  Administered 2023-12-16: 60 mg via ORAL
  Filled 2023-12-16: qty 3

## 2023-12-16 MED ORDER — IOHEXOL 350 MG/ML SOLN
75.0000 mL | Freq: Once | INTRAVENOUS | Status: AC | PRN
  Administered 2023-12-16: 75 mL via INTRAVENOUS

## 2023-12-16 NOTE — Discharge Instructions (Addendum)
 You were seen in the ED for vision changes in your left eye that resolved. While you were here we got labs, CT imaging of your head and neck, and talked to our neurology and ophthalmology colleagues.  You did not have any evidence of a stroke which is reassuring.  Dr. Waylan with ophthalmology would like to see you tomorrow at 2 PM in his clinic at [address] in [place] [phone]).  Please follow up with your primary care doctor for evaluation next week. Come back to the ED if you experience sudden vision loss, severe pain, weakness on one side of the body, numbness on one side of the body, have difficulty speaking or if you have any other recent bleeding emergency care

## 2023-12-16 NOTE — ED Triage Notes (Signed)
 As the quick triage nurse noted.. Pt reports seeing a shadow out of her left eye that started last night. Pt reports her vision is now normal. Pt is AxOx4. No focal neurological deficits noted during triage.

## 2023-12-16 NOTE — ED Provider Triage Note (Signed)
 Emergency Medicine Provider Triage Evaluation Note  Kara Lee , a 78 y.o. female  was evaluated in triage.  Pt complains of patient seeing a dark shadow on the medial side of left eye last night. Patient currently without this symptom. Currently asymptomatic. Denies eye pain, fever, nausea, vomiting, diarrhea. Denies current headache but endorses hx of migraines - this symptom is not typical for past migraines.   Review of Systems  Positive:  Negative:   Physical Exam  BP (!) 114/55 (BP Location: Left Arm)   Pulse 69   Temp 97.8 F (36.6 C)   Resp 17   SpO2 98%  Gen:   Awake, no distress   Resp:  Normal effort  MSK:   Moves extremities without difficulty  Other:    Medical Decision Making  Medically screening exam initiated at 3:37 PM.  Appropriate orders placed.  Pretty H Delpilar was informed that the remainder of the evaluation will be completed by another provider, this initial triage assessment does not replace that evaluation, and the importance of remaining in the ED until their evaluation is complete.     Hoy Nidia FALCON, [place] 12/16/23 1538

## 2023-12-16 NOTE — ED Provider Notes (Signed)
 [place] [facility] AT [facility] Provider Note HPI Kara Lee is a 78 y.o. female with a PMH of severe aortic stenosis status post TAVR, atrial fibrillation on eliquis , mild diastolic CHF, OSA on CPAP, diabetes mellitus, HTN, HLD who presents to the emergency department due to concern for vision changes in the left eye.  Patient has a history of ocular migraines and felt that she had an ocular migraine yesterday.  These migraines are characterized by floaters in the left eye.  She often does not have a headache during these events and did not have one yesterday.  After the floaters went away, she noted a dark spot in her superior medial visual field in the left eye.  This persisted throughout the night and was present when she woke up this morning.  She had no pain with this vision change.  It subsided in the late morning and she is coming in for evaluation at the recommendation of her PCP.  She denies headache or current visual changes.  Denies recent infectious symptoms.  Denies motor weakness or numbness.  She has had complete resolution of symptoms    Past Medical History:  Diagnosis Date   Anxiety    Cardiac arrhythmia    CHF (congestive heart failure) (HCC)    Colon polyp    Complication of anesthesia    Concussion 11/09/2012   From a fall at home on deck   Depression    postpartum depression after 2nd child   Diabetes mellitus (HCC)    Diverticulosis    Fatty liver    GERD (gastroesophageal reflux disease)    HLD (hyperlipidemia)    Hypertension    Morbid obesity (HCC)    OSA on CPAP    uses CPAP nightly   Osteoarthritis of right knee 07/31/2013   PONV (postoperative nausea and vomiting)    Rosacea    Severe aortic stenosis    Spinal stenosis    Past Surgical History:  Procedure Laterality Date   ABDOMINAL HYSTERECTOMY     still has uterus   ANKLE RECONSTRUCTION Left    ARTERY BIOPSY Left 01/21/2020   Procedure: BIOPSY TEMPORAL ARTERY;  Surgeon:  Karis Clunes, MD;  Location: [facility];  Service: ENT;  Laterality: Left;   BIOPSY BREAST Bilateral    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   CESAREAN SECTION     x 2   CHOLECYSTECTOMY     COLONOSCOPY  04/21/2016   Single small external hemorrhoid, small internal hemorrhoids, mild sigmoid diverticulosis, 8 mm rectal polyp removed with snare   EYE SURGERY     lasik; X3 for crossed eyes; 1 eyelid surgery   INCISIONAL HERNIA REPAIR     PARTIAL KNEE ARTHROPLASTY Right 07/31/2013   Procedure: RIGHT UNICOMPARTMENTAL KNEE;  Surgeon: Fonda SHAUNNA Olmsted, MD;  Location: [facility] OR;  Service: Orthopedics;  Laterality: Right;   RIGHT/LEFT HEART CATH AND CORONARY ANGIOGRAPHY N/A 10/04/2017   Procedure: RIGHT/LEFT HEART CATH AND CORONARY ANGIOGRAPHY;  Surgeon: Ladona Heinz, MD;  Location: [facility] INVASIVE CV LAB;  Service: Cardiovascular;  Laterality: N/A;   TEE WITHOUT CARDIOVERSION N/A 08/31/2017   Procedure: TRANSESOPHAGEAL ECHOCARDIOGRAM (TEE);  Surgeon: Ladona Heinz, MD;  Location: [facility] ENDOSCOPY;  Service: Cardiovascular;  Laterality: N/A;   TEE WITHOUT CARDIOVERSION N/A 01/30/2019   Procedure: TRANSESOPHAGEAL ECHOCARDIOGRAM (TEE);  Surgeon: Wonda Sharper, MD;  Location: [facility] INVASIVE CV LAB;  Service: Open Heart Surgery;  Laterality: N/A;   TRANSCATHETER AORTIC VALVE REPLACEMENT, TRANSFEMORAL  N/A 01/30/2019   Procedure: TRANSCATHETER AORTIC VALVE REPLACEMENT, TRANSFEMORAL;  Surgeon: Wonda Sharper, MD;  Location: [facility] INVASIVE CV LAB;  Service: Open Heart Surgery;  Laterality: N/A;   TUBAL LIGATION      Review of Systems Pertinent positives and negative findings are listed as part of the History of Present Illness and MDM  Physical Exam Vitals:   12/16/23 1523 12/16/23 1529 12/16/23 2126  BP: (!) 114/55  (!) 103/51  Pulse: 69  65  Resp: 17  16  Temp:  97.8 F (36.6 C) 97.9 F (36.6 C)  SpO2: 98%  99%     Constitutional Nursing notes reviewed Vital signs reviewed  HEENT No obvious  trauma Pupils round, equal, and reactive to light. Appropriate direct and consensual pupillary light reflex Extraocular movements intact 20/20 vision bilaterally Neck supple  Respiratory Effort normal Breathing well on room air CTAB  CV Normal rate and rhythm   Abdomen Soft, non-tender, non-distended No peritonitis  MSK Atraumatic No obvious deformity ROM appropriate  Neuro Cranial nerves II through XII intact 20/20 vision bilaterally No facial asymmetry 5/5 symmetric strength bilaterally Sensation to light touch intact bilaterally    MDM:  Initial Differential Diagnoses includes amaurosis fugax, TIA, CVA, CRAO, retinal detachment, vitreous detachment, vitreous hemorrhage, GCA  I reviewed the patient's vitals, the nursing triage note and evaluated the patient at bedside.  History is concerning for amaurosis fugax as the patient had monocular vision changes.  She describes approximately 12 hours of isolated left eye superior quadrantanopia that resolved.  This vision loss was painless.  This was after an ocular migraine that she infrequently gets.  Patient has multiple risk factors for amaurosis fugax including A-fib and aortic stenosis status post TAVR.  Chart review shows that the patient has not had a recent stroke workup.  Labs overall reassuring with no leukocytosis, significant electrolyte abnormality, significant anemia that would cause her symptoms.  Her hemoglobin is 11.5 which is around her baseline.  Patient has had no preceding infectious symptoms.  Her neurologic exam is reassuring as detailed above.  She has 20/20 vision bilaterally.  I did a bedside ocular ultrasound which shows no evidence of retinal detachment, vitreous detachment or vitreous hemorrhage.  CTA head and neck shows no acute findings.  I discussed the patient's case with neurology.  They recommend ophthalmology consult.  The patient is already on maximal therapy with Eliquis .  CTA is reassuring with no  evidence of significant vascular lesions.   I discussed the patient's case with ophthalmology (Dr. Waylan).  He recommends ESR and CRP to evaluate for giant cell arteritis.  The patient has no temporal tenderness to palpation.  ESR elevated to 47 and the patient was given a one-time dose of 60 mg of prednisone .  Dr. Waylan will see the patient in clinic tomorrow at 2 PM for evaluation.  Patient was given return precautions including vision loss, severe pain, aphasia, syncopal events, weakness on one side of the body.    Procedures: Procedures  Medications administered in the ED: Medications  iohexol  (OMNIPAQUE ) 350 MG/ML injection 75 mL (75 mLs Intravenous Contrast Given 12/16/23 1832)  predniSONE  (DELTASONE ) tablet 60 mg (60 mg Oral Given 12/16/23 2230)     Impression: 1. Vision changes      Patient's presentation is most consistent with acute presentation with potential threat to life or bodily function.  Disposition: ED Disposition:  Discharge   Discharge: Patient is felt to be medically appropriate for discharge at this time. Patient  was instructed to follow up with their primary care doctor/specialists listed above for re-evaluation. Patient was given strict return precautions.  ED Discharge Orders     None             Dionisio Blunt, MD 12/16/23 2333    Emil Share, DO 12/17/23 [phone]

## 2023-12-16 NOTE — ED Triage Notes (Signed)
 Pt came in via POV d/t a shadow she could see in the Rt corner of her Lt eye while she was driving last night. Denies any pain, HA or nausea.

## 2023-12-20 ENCOUNTER — Ambulatory Visit

## 2023-12-21 ENCOUNTER — Encounter: Payer: Self-pay | Admitting: Family Medicine

## 2023-12-21 ENCOUNTER — Ambulatory Visit (INDEPENDENT_AMBULATORY_CARE_PROVIDER_SITE_OTHER): Admitting: Family Medicine

## 2023-12-21 VITALS — BP 112/60 | HR 77 | Temp 98.4°F | Resp 18 | Ht 62.0 in | Wt 194.0 lb

## 2023-12-21 DIAGNOSIS — J309 Allergic rhinitis, unspecified: Secondary | ICD-10-CM

## 2023-12-21 MED ORDER — METHYLPREDNISOLONE ACETATE 80 MG/ML IJ SUSP
80.0000 mg | Freq: Once | INTRAMUSCULAR | Status: AC
  Administered 2023-12-21: 80 mg via INTRAMUSCULAR

## 2023-12-21 NOTE — Progress Notes (Signed)
 Chief Complaint  Patient presents with   Follow-up   Sore Throat    Onset: 1 week - fatigue , r ear    Subjective: Patient is a 78 y.o. female here for ER follow-up.  Patient went to the ER for vision changes.  This have since resolved.  She has an appointment with her eye doctor next week.  Workup in the ER was negative.  Over the last week, the patient has had fatigue, right ear fullness/pain, and sore throat.  Probably has a history of allergies.  Has not been taking anything at home.  Wonders if she needs some steroids.  Denies any fevers, shortness of breath, chest pain, wheezing, ear drainage, sinus pain.  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 2nd child   Diabetes mellitus (HCC)    Diverticulosis    Fatty liver    GERD (gastroesophageal reflux disease)    Heart murmur    HLD (hyperlipidemia)    Hypertension    Morbid obesity (HCC)    OSA on CPAP    uses CPAP nightly   Osteoarthritis of right knee 07/31/2013   PONV (postoperative nausea and vomiting)    Rosacea    Severe aortic stenosis    Sleep apnea    Spinal stenosis     Objective: BP 112/60   Pulse 77   Temp 98.4 F (36.9 C)   Resp 18   Ht 5' 2 (1.575 m)   Wt 194 lb (88 kg)   SpO2 96%   BMI 35.48 kg/m  General: Awake, appears stated age HEENT: Ears are patent without otorrhea, TMs negative, no sinus TTP, nares are patent with some rhinorrhea, MMM, postnasal drainage noted. Heart: RRR, no LE edema Lungs: CTAB, no rales, wheezes or rhonchi. No accessory muscle use Psych: Age appropriate judgment and insight, normal affect and mood  Assessment and Plan: Allergic rhinitis, unspecified seasonality, unspecified trigger - Plan: methylPREDNISolone  acetate (DEPO-MEDROL ) injection  80 mg  Depo-Medrol  injection today.  Continue oral antihistamine and nasal spray.  Probably has an allergy to mold/ragweed. Follow-up as originally scheduled. The patient voiced understanding and agreement to the plan.  Mabel [place], DO 12/21/23  5:15 PM

## 2023-12-21 NOTE — Patient Instructions (Addendum)
 OK to take Tylenol  1000 mg (2 extra strength tabs) or 975 mg (3 regular strength tabs) every 6 hours as needed.  Keep the diet clean and stay active.  Let us  know if you need anything.

## 2023-12-25 ENCOUNTER — Ambulatory Visit: Payer: Self-pay | Admitting: Cardiology

## 2023-12-26 ENCOUNTER — Ambulatory Visit: Payer: Self-pay

## 2023-12-26 NOTE — Telephone Encounter (Signed)
 FYI Only or Action Required?: Action needed: clinical question regarding what she should do or take for constipation between now and her appt on Wednesday.  Patient was last seen in primary care on 12/21/2023 by Frann Mabel Mt, DO.  Called Nurse Triage reporting Constipation.  Symptoms began yesterday.  Interventions attempted: Nothing.  Symptoms are: small hard BM yesterday, constipation today and states she feels like she needs to have a BM but it is impacted/stuck; she states she has not been able to try any home remedies due to her husband being sick gradually worsening.  Triage Disposition: See PCP When Office is Open (Within 3 Days)- advised patient to try suppository, drink fluids and increase fiber intake  Patient/caregiver understands and will follow disposition?: Yes               Reason for Disposition  Unable to have a bowel movement (BM) without laxative or enema  Answer Assessment - Initial Assessment Questions 1. STOOL PATTERN OR FREQUENCY: How often do you have a bowel movement (BM)?  (Normal range: 3 times a day to every 3 days)  When was your last BM?       Last BM: small yesterday. She states she needed to have a BM this morning but it would not come out. Her baseline is once daily. She states it felt like the stool this morning was impacted.  2. STRAINING: Do you have to strain to have a BM?      Yes, she was straining this morning.  3. ONSET: When did the constipation begin?     Yesterday, worse today.  4. RECTAL PAIN: Does your rectum hurt when the stool comes out? If Yes, ask: Do you have hemorrhoids? How bad is the pain?  (Scale 1-10; or mild, moderate, severe)     Yes, mild pain. She states she does have hemorrhoids.  5. BM COMPOSITION: Are the stools hard?      Yes.  6. BLOOD ON STOOLS: Has there been any blood on the toilet tissue or on the surface of the BM? If Yes, ask: When was the last time?     Yes, blood on  toilet tissue. This morning.  7. CHRONIC CONSTIPATION: Is this a new problem for you?  If No, ask: How long have you had this problem? (days, weeks, months)      No. She states she doesn't have it that often but she states it has been worsening lately.  8. CHANGES IN DIET OR HYDRATION: Have there been any recent changes in your diet? How much fluids are you drinking on a daily basis?  How much have you had to drink today?     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 She states she has only had 2 cups of coffee today.  9. MEDICINES: Have you been taking any new medicines? Are you taking any narcotic pain medicines? (e.g., Dilaudid , morphine , Percocet, Vicodin)     Wegovy .  10. LAXATIVES: Have you been using any stool softeners, laxatives, or enemas?  If Yes, ask What are you using, how often, and when was the last time?       She states she takes a stool softener almost daily.  11. ACTIVITY:  How much walking do you do every day?  Has your activity level decreased in the past week?        Decrease  in activity level over past several weeks due to her husbands illness.  12. CAUSE: What do you think is causing the constipation?        She states she has been having to hold in her BM due to taking care of her husband, she doesn't have time to eat/drink or even go to the bathroom.  13. MEDICAL HISTORY: Do you have a history of hemorrhoids, rectal fissures, rectal surgery, or rectal abscess?         Hemorrhoids.  14. OTHER SYMPTOMS: Do you have any other symptoms? (e.g., abdomen pain, bloating, fever, vomiting)       Denies vomiting, fever, unsure of bloating. She states she has some mid lower abdominal pain 3/10, comes and goes.  15. PREGNANCY: Is there any chance you are pregnant? When was your last menstrual period?       N/A.  Protocols used: Constipation-A-AH

## 2023-12-28 ENCOUNTER — Ambulatory Visit: Admitting: Family Medicine

## 2023-12-28 ENCOUNTER — Other Ambulatory Visit (HOSPITAL_BASED_OUTPATIENT_CLINIC_OR_DEPARTMENT_OTHER): Payer: Self-pay

## 2023-12-28 ENCOUNTER — Encounter: Payer: Self-pay | Admitting: Family Medicine

## 2023-12-28 ENCOUNTER — Ambulatory Visit (INDEPENDENT_AMBULATORY_CARE_PROVIDER_SITE_OTHER): Admitting: Family Medicine

## 2023-12-28 VITALS — BP 116/68 | HR 84 | Temp 98.0°F | Resp 16 | Ht 62.0 in | Wt 190.4 lb

## 2023-12-28 DIAGNOSIS — K5900 Constipation, unspecified: Secondary | ICD-10-CM

## 2023-12-28 MED ORDER — FLUZONE HIGH-DOSE 0.5 ML IM SUSY
0.5000 mL | PREFILLED_SYRINGE | Freq: Once | INTRAMUSCULAR | 0 refills | Status: AC
  Filled 2023-12-28: qty 0.5, 1d supply, fill #0

## 2023-12-28 NOTE — Progress Notes (Signed)
 Chief Complaint  Patient presents with   Constipation    Constipation    Subjective: Patient is a 78 y.o. female here for constipation.  Patient has had issues stooling over the past few weeks.  The other day she had to put on a rubber glove and pull out/disimpact her bowel.  She is still passing gas.  No nausea, abdominal pain, vomiting, bloating.  Diet could be better.  She is on Mounjaro  7.5 mg weekly.  Compliant, no other adverse effects.  Her husband's health is failing.  This is robbing her of her appetite and she is admittedly not eating as much or drinking as much.  Denies bleeding.  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 2nd child   Diabetes mellitus (HCC)    Diverticulosis    Fatty liver    GERD (gastroesophageal reflux disease)    Heart murmur    HLD (hyperlipidemia)    Hypertension    Morbid obesity (HCC)    OSA on CPAP    uses CPAP nightly   Osteoarthritis of right knee 07/31/2013   PONV (postoperative nausea and vomiting)    Rosacea    Severe aortic stenosis    Sleep apnea    Spinal stenosis     Objective: BP 116/68 (BP Location: Left Arm, Patient Position: Sitting)   Pulse 84   Temp 98 F (36.7 C) (Oral)   Resp 16   Ht 5' 2 (1.575 m)   Wt 190 lb 6.4 oz (86.4 kg)   SpO2 98%   BMI 34.82 kg/m  General: Awake, appears stated age Heart: RRR, no LE edema Lungs: CTAB, no rales, wheezes or rhonchi. No accessory muscle use Abdomen: Bowel sounds present, soft, nontender, nondistended. Psych: Age appropriate judgment and insight, normal affect and mood  Assessment and Plan: Constipation, unspecified constipation type  I think her diet is being affected by both Mounjaro  and her husband's health.  Needs to increase hydration.   Consider a fiber supplement.  Consider MiraLAX  as this has worked well in the past.  Consider 2 tablespoons of milk of magnesia mixed with prune juice if she really needs to have a bowel movement.  She will use a suppository or enema if these do not work. The patient voiced understanding and agreement to the plan.  Mabel Mt Dumas, DO 12/28/23  4:53 PM

## 2023-12-28 NOTE — Patient Instructions (Addendum)
 Try MiraLAX  1-2 times daily over the next 3-4 days. If no improvement, try using an enema. Stay well hydrated and keep lots of fiber in your diet.  Stay active.  Try to drink 55-60 oz of water daily outside of exercise.  Try 2 tablespoons of milk of mag in 4 oz of warm prune juice. Do that and wait a couple hours. If no improvement, try a Dulcolax suppository and then let me know if we are still having issues.   Let us  know if you need anything.

## 2023-12-30 ENCOUNTER — Other Ambulatory Visit (HOSPITAL_BASED_OUTPATIENT_CLINIC_OR_DEPARTMENT_OTHER): Payer: Self-pay

## 2023-12-30 MED ORDER — COVID-19 MRNA VAC-TRIS(PFIZER) 30 MCG/0.3ML IM SUSY
0.3000 mL | PREFILLED_SYRINGE | Freq: Once | INTRAMUSCULAR | 0 refills | Status: AC
  Filled 2023-12-30: qty 0.3, 1d supply, fill #0

## 2024-01-01 DIAGNOSIS — G4733 Obstructive sleep apnea (adult) (pediatric): Secondary | ICD-10-CM | POA: Diagnosis not present

## 2024-01-05 ENCOUNTER — Other Ambulatory Visit: Payer: Self-pay | Admitting: Cardiology

## 2024-01-06 ENCOUNTER — Other Ambulatory Visit (HOSPITAL_BASED_OUTPATIENT_CLINIC_OR_DEPARTMENT_OTHER): Payer: Self-pay

## 2024-01-06 ENCOUNTER — Other Ambulatory Visit: Payer: Self-pay

## 2024-01-06 ENCOUNTER — Other Ambulatory Visit: Payer: Self-pay | Admitting: Family Medicine

## 2024-01-06 DIAGNOSIS — R809 Proteinuria, unspecified: Secondary | ICD-10-CM

## 2024-01-06 MED ORDER — ATENOLOL 25 MG PO TABS
25.0000 mg | ORAL_TABLET | Freq: Two times a day (BID) | ORAL | 0 refills | Status: DC
  Filled 2024-01-06: qty 180, 90d supply, fill #0

## 2024-01-09 ENCOUNTER — Other Ambulatory Visit: Payer: Self-pay | Admitting: Family Medicine

## 2024-01-09 ENCOUNTER — Other Ambulatory Visit: Payer: Self-pay

## 2024-01-09 ENCOUNTER — Other Ambulatory Visit (HOSPITAL_BASED_OUTPATIENT_CLINIC_OR_DEPARTMENT_OTHER): Payer: Self-pay

## 2024-01-09 DIAGNOSIS — I480 Paroxysmal atrial fibrillation: Secondary | ICD-10-CM

## 2024-01-09 MED ORDER — APIXABAN 5 MG PO TABS
5.0000 mg | ORAL_TABLET | Freq: Two times a day (BID) | ORAL | 1 refills | Status: AC
  Filled 2024-01-09: qty 180, 90d supply, fill #0
  Filled 2024-04-06: qty 180, 90d supply, fill #1

## 2024-01-09 MED ORDER — LOSARTAN POTASSIUM 25 MG PO TABS
25.0000 mg | ORAL_TABLET | Freq: Every day | ORAL | 2 refills | Status: AC
  Filled 2024-01-09: qty 90, 90d supply, fill #0
  Filled 2024-02-14 – 2024-04-06 (×2): qty 90, 90d supply, fill #1

## 2024-01-09 MED ORDER — OMEPRAZOLE 40 MG PO CPDR
40.0000 mg | DELAYED_RELEASE_CAPSULE | Freq: Every day | ORAL | 2 refills | Status: AC
  Filled 2024-01-09: qty 90, 90d supply, fill #0
  Filled 2024-04-06: qty 90, 90d supply, fill #1

## 2024-01-10 ENCOUNTER — Other Ambulatory Visit (HOSPITAL_BASED_OUTPATIENT_CLINIC_OR_DEPARTMENT_OTHER): Payer: Self-pay

## 2024-01-10 MED ORDER — COMIRNATY 30 MCG/0.3ML IM SUSY
0.3000 mL | PREFILLED_SYRINGE | Freq: Once | INTRAMUSCULAR | 0 refills | Status: AC
  Filled 2024-01-10: qty 0.3, 1d supply, fill #0

## 2024-01-19 DIAGNOSIS — H16102 Unspecified superficial keratitis, left eye: Secondary | ICD-10-CM | POA: Diagnosis not present

## 2024-01-27 ENCOUNTER — Other Ambulatory Visit: Payer: Self-pay | Admitting: Cardiology

## 2024-01-27 DIAGNOSIS — I6523 Occlusion and stenosis of bilateral carotid arteries: Secondary | ICD-10-CM

## 2024-01-27 DIAGNOSIS — E7800 Pure hypercholesterolemia, unspecified: Secondary | ICD-10-CM

## 2024-01-30 ENCOUNTER — Ambulatory Visit: Admitting: Family Medicine

## 2024-01-30 ENCOUNTER — Encounter: Payer: Self-pay | Admitting: Family Medicine

## 2024-01-30 VITALS — BP 116/70 | HR 78 | Temp 98.0°F | Resp 16 | Ht 62.0 in | Wt 189.8 lb

## 2024-01-30 DIAGNOSIS — M791 Myalgia, unspecified site: Secondary | ICD-10-CM | POA: Diagnosis not present

## 2024-01-30 MED ORDER — METHYLPREDNISOLONE ACETATE 40 MG/ML IJ SUSP
40.0000 mg | Freq: Once | INTRAMUSCULAR | Status: AC
  Administered 2024-01-30: 40 mg via INTRAMUSCULAR

## 2024-01-30 NOTE — Patient Instructions (Signed)
 Heat (pad or rice pillow in microwave) over affected area, 10-15 minutes twice daily.   Ice/cold pack over area for 10-15 min twice daily.  OK to take Tylenol 1000 mg (2 extra strength tabs) or 975 mg (3 regular strength tabs) every 6 hours as needed.  Let us know if you need anything.

## 2024-01-30 NOTE — Progress Notes (Signed)
 Musculoskeletal Exam  Patient: Kara Lee DOB: 21-Mar-1946  DOS: 01/30/2024  SUBJECTIVE:  Chief Complaint:   Chief Complaint  Patient presents with   Shoulder Pain    Shoulder  Pain    Kara Lee is a 78 y.o.  female for evaluation and treatment of L shoulder/neck pain.   Onset:  2 days ago. No inj or change in activity. Has been under a lot of stress with illness of husband.  Location: L trap Character:  sharp  Progression of issue:  has worsened Associated symptoms: no bruising, redness, swelling, decreased ROM Treatment: to date has been none.   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 2nd child   Diabetes mellitus (HCC)    Diverticulosis    Fatty liver    GERD (gastroesophageal reflux disease)    Heart murmur    HLD (hyperlipidemia)    Hypertension    Morbid obesity (HCC)    OSA on CPAP    uses CPAP nightly   Osteoarthritis of right knee 07/31/2013   PONV (postoperative nausea and vomiting)    Rosacea    Severe aortic stenosis    Sleep apnea    Spinal stenosis     Objective: VITAL SIGNS: BP 116/70 (BP Location: Left Arm, Patient Position: Sitting)   Pulse 78   Temp 98 F (36.7 C) (Oral)   Resp 16   Ht 5' 2 (1.575 m)   Wt 189 lb 12.8 oz (86.1 kg)   SpO2 96%   BMI 34.71 kg/m  Constitutional: Well formed, well developed. No acute distress. Thorax & Lungs: No accessory muscle use Musculoskeletal: Left trapezius.   Tenderness to palpation: Multiple trigger points Deformity: no Ecchymosis: no Tests positive: None Tests negative: Spurling's Neurologic: Normal sensory function.  Psychiatric: Normal mood. Age appropriate judgment and insight. Alert & oriented x 3.    Procedure note; trigger  point injection Verbal consent obtained. The areas of interest on the left trapezius were demarcated with an otoscope speculum tip. There were then cleaned with alcohol. 1 mL was injected at each trigger point (total of 3) with a concoction of 2 mL of 2% lidocaine  without epinephrine  and 40 mg of Depo-Medrol . The areas were then bandaged. There were no complications noted. The patient tolerated the procedure well.  Assessment:  Trigger point - Plan: methylPREDNISolone  acetate (DEPO-MEDROL ) injection 40 mg, PR INJECTION SINGLE/MLT TRIGGER POINT 1/2 MUSCLES  Plan: Stretches, trigger point injections today, heat, ice, Tylenol .  F/u as originally scheduled. The patient voiced understanding and agreement to the plan.   Kara Mt Roseto, DO 01/30/24  4:37 PM

## 2024-02-03 ENCOUNTER — Telehealth: Payer: Self-pay | Admitting: Cardiology

## 2024-02-03 NOTE — Telephone Encounter (Signed)
Patient is requesting a call back to schedule ablation.

## 2024-02-03 NOTE — Telephone Encounter (Signed)
 Spoke with the patient and scheduled her for an ablation with Dr. Kennyth on 12/22.

## 2024-02-06 NOTE — Telephone Encounter (Signed)
 Pt contacted and she reports that Atrial Fib is coming and going, but has associated symptoms of lightheadedness, feeling warm and then cold, and chest pressure. Pt admits that she been stressed with her husband being in and out of the hospital, but she is growing increasingly worried since symptoms are frequent. Pt reports that she is scheduled for ablation on 12/26, but was advised previously that there was a opening on 11/5. Pt wonders if she needs ablation sooner?

## 2024-02-06 NOTE — Telephone Encounter (Signed)
 Patient called to report she will be available on her mobile number [phone]) the rest of today (10/20) regarding scheduling ablation.

## 2024-02-06 NOTE — Telephone Encounter (Signed)
 Pt requesting callback to reschedule ablation. Please advise.

## 2024-02-07 NOTE — Telephone Encounter (Signed)
 Patient is already scheduled for an ablation on 12/22. She is on Dr. Shaune wait list and is aware we will call her if there are any sooner openings.

## 2024-02-09 ENCOUNTER — Ambulatory Visit: Admitting: Podiatry

## 2024-02-09 ENCOUNTER — Encounter: Payer: Self-pay | Admitting: Podiatry

## 2024-02-09 DIAGNOSIS — L84 Corns and callosities: Secondary | ICD-10-CM

## 2024-02-09 DIAGNOSIS — E1142 Type 2 diabetes mellitus with diabetic polyneuropathy: Secondary | ICD-10-CM | POA: Diagnosis not present

## 2024-02-09 NOTE — Progress Notes (Signed)
  Subjective:  Patient ID: Kara Lee, female    DOB: [birth year],   MRN: [id number]  Chief Complaint  Patient presents with   Diabetes    I'm here for my three month follow-up.  I have these things on my toes, calluses.  Saw Dr. Mabel Pry - 01/30/2024; A1c - 7.8 06/29/2023    78 y.o. female presents for concern of bilateral great toe calluses that have been present for a while. They have been very painful. Requesting to have them trimmed today. Denies burning and tingling in their feet. Patient is diabetic and last A1c was  Lab Results  Component Value Date   HGBA1C 7.8 (H) 07/18/2023   .   PCP:  Pry Mabel Mt, DO    . Denies any other pedal complaints. Denies n/v/f/c.   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 2nd child   Diabetes mellitus (HCC)    Diverticulosis    Fatty liver    GERD (gastroesophageal reflux disease)    Heart murmur    HLD (hyperlipidemia)    Hypertension    Morbid obesity (HCC)    OSA on CPAP    uses CPAP nightly   Osteoarthritis of right knee 07/31/2013   PONV (postoperative nausea and vomiting)    Rosacea    Severe aortic stenosis    Sleep apnea    Spinal stenosis     Objective:  Physical Exam: Vascular: DP/PT pulses 2/4 bilateral. CFT <3 seconds. Absent hair growth on digits. Edema noted to bilateral lower extremities. Xerosis noted bilaterally. Varicose and spider veins noted to left lower extremity.  Skin. No lacerations or abrasions bilateral feet. Nails 1-5 bilateral  are normal in appearance. Hyperkeartotic lesions plantar hallux bilateral nearly ulcerative.  Musculoskeletal: MMT 5/5 bilateral lower extremities in DF, PF, Inversion and Eversion. Deceased ROM in DF of ankle joint.   Neurological: Sensation intact to light touch. Protective sensation  intact  bilateral.    Assessment:   1. Pre-ulcerative calluses   2. Type 2 diabetes mellitus with peripheral neuropathy (HCC)      Plan:  Patient was evaluated and treated and all questions answered. -Discussed and educated patient on diabetic foot care, especially with  regards to the vascular, neurological and musculoskeletal systems.  -Stressed the importance of good glycemic control and the detriment of not  controlling glucose levels in relation to the foot. -Discussed supportive shoes at all times and checking feet regularly.  -Mechanically debrided hyperkeratotic lesions plantar hallux bilateral without incident.  -Answered all patient questions -Patient to return  in 3 months for at risk foot care -Patient advised to call the office if any problems or questions arise in the meantime.   Asberry Failing, DPM

## 2024-02-13 DIAGNOSIS — H16223 Keratoconjunctivitis sicca, not specified as Sjogren's, bilateral: Secondary | ICD-10-CM | POA: Diagnosis not present

## 2024-02-14 ENCOUNTER — Other Ambulatory Visit: Payer: Self-pay

## 2024-02-14 ENCOUNTER — Other Ambulatory Visit: Payer: Self-pay | Admitting: Family Medicine

## 2024-02-14 ENCOUNTER — Other Ambulatory Visit: Payer: Self-pay | Admitting: Cardiology

## 2024-02-14 ENCOUNTER — Other Ambulatory Visit (HOSPITAL_BASED_OUTPATIENT_CLINIC_OR_DEPARTMENT_OTHER): Payer: Self-pay

## 2024-02-14 DIAGNOSIS — M797 Fibromyalgia: Secondary | ICD-10-CM

## 2024-02-14 MED ORDER — METFORMIN HCL ER 500 MG PO TB24
500.0000 mg | ORAL_TABLET | Freq: Two times a day (BID) | ORAL | 0 refills | Status: DC
  Filled 2024-02-14: qty 180, 90d supply, fill #0

## 2024-02-16 ENCOUNTER — Ambulatory Visit

## 2024-02-17 ENCOUNTER — Ambulatory Visit (INDEPENDENT_AMBULATORY_CARE_PROVIDER_SITE_OTHER): Admitting: *Deleted

## 2024-02-17 ENCOUNTER — Telehealth: Payer: Self-pay | Admitting: *Deleted

## 2024-02-17 VITALS — Ht 62.0 in | Wt 189.0 lb

## 2024-02-17 DIAGNOSIS — Z780 Asymptomatic menopausal state: Secondary | ICD-10-CM | POA: Diagnosis not present

## 2024-02-17 DIAGNOSIS — M8580 Other specified disorders of bone density and structure, unspecified site: Secondary | ICD-10-CM

## 2024-02-17 DIAGNOSIS — Z1231 Encounter for screening mammogram for malignant neoplasm of breast: Secondary | ICD-10-CM | POA: Diagnosis not present

## 2024-02-17 DIAGNOSIS — Z0000 Encounter for general adult medical examination without abnormal findings: Secondary | ICD-10-CM | POA: Diagnosis not present

## 2024-02-17 NOTE — Patient Instructions (Addendum)
 Ms. Redlich , Thank you for taking time out of your busy schedule to complete your Annual Wellness Visit with me. I enjoyed our conversation and look forward to speaking with you again next year. I, as well as your care team,  appreciate your ongoing commitment to your health goals. Please review the following plan we discussed and let me know if I can assist you in the future. Your Game plan/ To Do List   Referrals: If you haven't heard from the office you've been referred to, please reach out to them at the phone provided.   Bone Denisty / mammogram ([facility]):  [phone]  Follow up Visits: Next Medicare AWV with our clinical staff: 02/20/25 1:40pm, telephone.    Next Office Visit with your provider: 02/21/24 2pm, Dr Frann (diabetes follow up)  Clinician Recommendations:  Aim for 30 minutes of exercise or brisk walking, 6-8 glasses of water, and 5 servings of fruits and vegetables each day.       This is a list of the screening recommended for you and due dates:  Health Maintenance  Topic Date Due   DEXA scan (bone density measurement)  01/22/2021   Medicare Annual Wellness Visit  09/19/2022   Complete foot exam   01/04/2024   Hemoglobin A1C  01/17/2024   Yearly kidney health urinalysis for diabetes  06/16/2024   COVID-19 Vaccine (9 - Pfizer risk 2025-26 season) 07/09/2024   Eye exam for diabetics  08/28/2024   Yearly kidney function blood test for diabetes  12/15/2024   Colon Cancer Screening  06/02/2026   DTaP/Tdap/Td vaccine (3 - Td or Tdap) 11/03/2031   Pneumococcal Vaccine for age over 63  Completed   Flu Shot  Completed   Hepatitis C Screening  Completed   Zoster (Shingles) Vaccine  Completed   Meningitis B Vaccine  Aged Out   Breast Cancer Screening  Discontinued   Please let me know if you do not receive your Advanced Directive Packet within 1 week.  Advanced directives: (Provided) Advance directive discussed with you today. I have provided a copy for you  to complete at home and have notarized. Once this is complete, please bring a copy in to our office so we can scan it into your chart.  Advance Care Planning is important because it:  [x]  Makes sure you receive the medical care that is consistent with your values, goals, and preferences  [x]  It provides guidance to your family and loved ones and reduces their decisional burden about whether or not they are making the right decisions based on your wishes.  Follow the link provided in your after visit summary or read over the paperwork we have mailed to you to help you started getting your Advance Directives in place. If you need assistance in completing these, please reach out to us  so that we can help you!  See attachments for Preventive Care and Fall Prevention Tips.

## 2024-02-17 NOTE — Progress Notes (Signed)
 Subjective:   Kara Lee is a 78 y.o. who presents for a Medicare Wellness preventive visit.  As a reminder, Annual Wellness Visits don't include a physical exam, and some assessments may be limited, especially if this visit is performed virtually. We may recommend an in-person follow-up visit with your provider if needed.  Visit Complete: Virtual I connected with  Kara Lee on 02/17/24 by a audio enabled telemedicine application and verified that I am speaking with the correct person using two identifiers.  Patient Location: Home  Provider Location: Office/Clinic  I discussed the limitations of evaluation and management by telemedicine. The patient expressed understanding and agreed to proceed.  Vital Signs: Because this visit was a virtual/telehealth visit, some criteria may be missing or patient reported. Any vitals not documented were not able to be obtained and vitals that have been documented are patient reported.  VideoDeclined- This patient declined Librarian, academic. Therefore the visit was completed with audio only.  Persons Participating in Visit: Patient.  AWV Questionnaire: No: Patient Medicare AWV questionnaire was not completed prior to this visit.  Cardiac Risk Factors include: advanced age (>35men, >73 women);diabetes mellitus;dyslipidemia;hypertension;Other (see comment), Risk factor comments: OSA, a-fib, aortic stenosis     Objective:    Today's Vitals   02/17/24 1259  Weight: 189 lb (85.7 kg)  Height: 5' 2 (1.575 m)   Body mass index is 34.57 kg/m.     02/17/2024    1:34 PM 12/16/2023    3:32 PM 11/16/2023    4:11 PM 11/14/2023    3:18 AM 10/09/2023    2:06 PM 09/18/2021    2:23 PM 04/19/2020   11:47 AM  Advanced Directives  Does Patient Have a Medical Advance Directive? No No No No No Yes No  Type of Careers Adviser;Out of facility DNR (pink MOST or yellow form);Living will   Does  patient want to make changes to medical advance directive?      No - Patient declined   Copy of Healthcare Power of Attorney in Chart?      No - copy requested   Would patient like information on creating a medical advance directive? Yes (MAU/Ambulatory/Procedural Areas - Information given)  No - Patient declined No - Patient declined       Current Medications (verified) Outpatient Encounter Medications as of 02/17/2024  Medication Sig   acetaminophen  (TYLENOL ) 650 MG CR tablet Take 650 mg by mouth every 8 (eight) hours as needed for pain.   apixaban  (ELIQUIS ) 5 MG TABS tablet Take 1 tablet (5 mg total) by mouth 2 (two) times daily.   atenolol  (TENORMIN ) 25 MG tablet Take 1 tablet (25 mg total) by mouth 2 (two) times daily.   diltiazem  (CARDIZEM ) 30 MG tablet Take 1 tablet (30 mg total) by mouth every 6 (six) hours as needed.   docusate sodium  (COLACE) 100 MG capsule Take 100 mg by mouth every morning.   EPINEPHrine  (EPIPEN  2-PAK) 0.3 mg/0.3 mL IJ SOAJ injection Inject 0.3 mg into the muscle as needed for anaphylaxis.   insulin  regular (NOVOLIN R) 100 units/mL injection Inject into the skin daily per sliding scale. Max 40 units a day. (Patient taking differently: Inject 10 Units into the skin at bedtime.)   losartan  (COZAAR ) 25 MG tablet Take 1 tablet (25 mg total) by mouth daily.   metFORMIN  (GLUCOPHAGE -XR) 500 MG 24 hr tablet Take 1 tablet (500 mg total) by mouth  2 (two) times daily with a meal.   methocarbamol  (ROBAXIN ) 500 MG tablet Take 1 tablet (500 mg total) by mouth every 6 (six) to 8 (eight) hours as needed.   mometasone  (NASONEX ) 50 MCG/ACT nasal spray Place 2 sprays into the nose daily. (Patient taking differently: Place 2 sprays into the nose daily as needed (Sinus allergies).)   omeprazole  (PRILOSEC) 40 MG capsule Take 1 capsule (40 mg total) by mouth daily.   Pitavastatin  Magnesium  (ZYPITAMAG ) 4 MG TABS TAKE 1 TABLET BY MOUTH EVERY DAY   SYSTANE COMPLETE PF 0.6 % SOLN Place 1 drop  into both eyes in the morning and at bedtime. Instill 1 drop into both eyes two times a day   tirzepatide  (MOUNJARO ) 7.5 MG/0.5ML Pen Inject 7.5 mg into the skin once a week.   No facility-administered encounter medications on file as of 02/17/2024.    Allergies (verified) Bee venom, Cipro  [ciprofloxacin  hcl], Glimepiride, Latex, Metronidazole , Shellfish allergy, Statins, and Tape   History: 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 2nd child   Diabetes mellitus (HCC)    Diverticulosis    Fatty liver    GERD (gastroesophageal reflux disease)    Heart murmur    HLD (hyperlipidemia)    Hypertension    Morbid obesity (HCC)    OSA on CPAP    uses CPAP nightly   Osteoarthritis of right knee 07/31/2013   PONV (postoperative nausea and vomiting)    Rosacea    Severe aortic stenosis    Sleep apnea    Spinal stenosis    Past Surgical History:  Procedure Laterality Date   ABDOMINAL HYSTERECTOMY     still has uterus   ANKLE RECONSTRUCTION Left    ARTERY BIOPSY Left 01/21/2020   Procedure: BIOPSY TEMPORAL ARTERY;  Surgeon: Karis Clunes, MD;  Location: [facility];  Service: ENT;  Laterality: Left;   BIOPSY BREAST Bilateral    BREAST SURGERY     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   CESAREAN SECTION     x 2   CHOLECYSTECTOMY     COLONOSCOPY  04/21/2016   Single small external hemorrhoid, small internal hemorrhoids, mild sigmoid diverticulosis, 8 mm rectal polyp removed with snare   EYE SURGERY     lasik; X3 for crossed eyes; 1 eyelid surgery   HERNIA REPAIR     INCISIONAL HERNIA REPAIR     JOINT REPLACEMENT     PARTIAL KNEE ARTHROPLASTY Right 07/31/2013   Procedure: RIGHT UNICOMPARTMENTAL KNEE;  Surgeon: Fonda SHAUNNA Olmsted, MD;   Location: [facility] OR;  Service: Orthopedics;  Laterality: Right;   RIGHT/LEFT HEART CATH AND CORONARY ANGIOGRAPHY N/A 10/04/2017   Procedure: RIGHT/LEFT HEART CATH AND CORONARY ANGIOGRAPHY;  Surgeon: Ladona Heinz, MD;  Location: [facility] INVASIVE CV LAB;  Service: Cardiovascular;  Laterality: N/A;   TEE WITHOUT CARDIOVERSION N/A 08/31/2017   Procedure: TRANSESOPHAGEAL ECHOCARDIOGRAM (TEE);  Surgeon: Ladona Heinz, MD;  Location: [facility] ENDOSCOPY;  Service: Cardiovascular;  Laterality: N/A;   TEE WITHOUT CARDIOVERSION N/A 01/30/2019   Procedure: TRANSESOPHAGEAL ECHOCARDIOGRAM (TEE);  Surgeon: Wonda Sharper, MD;  Location: [facility] INVASIVE CV LAB;  Service: Open Heart Surgery;  Laterality: N/A;   TRANSCATHETER  AORTIC VALVE REPLACEMENT, TRANSFEMORAL N/A 01/30/2019   Procedure: TRANSCATHETER AORTIC VALVE REPLACEMENT, TRANSFEMORAL;  Surgeon: Wonda Sharper, MD;  Location: [facility] INVASIVE CV LAB;  Service: Open Heart Surgery;  Laterality: N/A;   TUBAL LIGATION     Family History  Problem Relation Age of Onset   Pancreatitis Mother    COPD Mother    Diabetes type II Mother    Cancer Mother    Diabetes Mother    Heart disease Mother    Cirrhosis Sister        NASH   Allergic rhinitis Maternal Grandmother    Breast cancer Maternal Grandmother    Anxiety disorder Maternal Grandmother    Varicose Veins Maternal Grandmother    Cataracts Maternal Grandfather    Prostate cancer Maternal Grandfather    Heart disease Maternal Grandfather    Hearing loss Maternal Grandfather    Asthma Son    Kidney Stones Daughter    Heart disease Paternal Grandfather    Immunodeficiency Neg Hx    Angioedema Neg Hx    Eczema Neg Hx    Urticaria Neg Hx    Social History   Socioeconomic History   Marital status: Married    Spouse name: Not on file   Number of children: 2   Years of education: Not on file   Highest education level: 12th grade  Occupational History   Occupation: retired  Tobacco Use   Smoking status: Never    Smokeless tobacco: Never   Tobacco comments:    Never smoked 08/22/23  Vaping Use   Vaping status: Never Used  Substance and Sexual Activity   Alcohol use: Not Currently   Drug use: No   Sexual activity: Not Currently    Partners: Male    Birth control/protection: Surgical  Other Topics Concern   Not on file  Social History Narrative   Not on file   Social Drivers of Health   Financial Resource Strain: Low Risk  (02/17/2024)   Overall Financial Resource Strain (CARDIA)    Difficulty of Paying Living Expenses: Not very hard  Food Insecurity: No Food Insecurity (02/17/2024)   Hunger Vital Sign    Worried About Running Out of Food in the Last Year: Never true    Ran Out of Food in the Last Year: Never true  Transportation Needs: No Transportation Needs (02/17/2024)   PRAPARE - Administrator, Civil Service (Medical): No    Lack of Transportation (Non-Medical): No  Physical Activity: Sufficiently Active (02/17/2024)   Exercise Vital Sign    Days of Exercise per Week: 5 days    Minutes of Exercise per Session: 30 min  Stress: Stress Concern Present (02/17/2024)   [employer] of Occupational Health - Occupational Stress Questionnaire    Feeling of Stress: To some extent  Social Connections: Socially Isolated (02/17/2024)   Social Connection and Isolation Panel    Frequency of Communication with Friends and Family: Once a week    Frequency of Social Gatherings with Friends and Family: Once a week    Attends Religious Services: Never    Database Administrator or Organizations: No    Attends Banker Meetings: Never    Marital Status: Married    Tobacco Counseling Counseling given: Not Answered Tobacco comments: Never smoked 08/22/23    Clinical Intake:  Pre-visit preparation completed: Yes  Pain : No/denies pain     BMI - recorded: 34.57 Nutritional Status: BMI > 30  Obese Nutritional Risks:  None Diabetes: Yes CBG done?: No Did pt.  bring in CBG monitor from home?: No  Lab Results  Component Value Date   HGBA1C 7.8 (H) 07/18/2023   HGBA1C 8.0 (H) 02/04/2023   HGBA1C 7.9 (H) 10/22/2022     How often do you need to have someone help you when you read instructions, pamphlets, or other written materials from your doctor or pharmacy?: 1 - Never What is the last grade level you completed in school?: 12  Interpreter Needed?: No  Information entered by :: Quanetta Truss, CMA(AAMA)   Activities of Daily Living     02/17/2024    1:02 PM 12/20/2023    4:23 PM  In your present state of health, do you have any difficulty performing the following activities:  Hearing? 0 0  Vision? 0 0  Difficulty concentrating or making decisions? 0 0  Walking or climbing stairs? 0 0  Dressing or bathing? 0 0  Doing errands, shopping? 0 0  Preparing Food and eating ? N N  Using the Toilet? N N  In the past six months, have you accidently leaked urine? N N  Do you have problems with loss of bowel control? N N  Managing your Medications? N N  Managing your Finances? N N  Housekeeping or managing your Housekeeping? N N    Patient Care Team: Frann Mabel Mt, DO as PCP - General (Family Medicine) Ladona Heinz, MD as PCP - Cardiology (Cardiology) Kennyth Chew, MD as PCP - Electrophysiology (Cardiology) [facility]  I have updated your Care Teams any recent Medical Services you may have received from other providers in the past year.     Assessment:   This is a routine wellness examination for Kara Lee.  Hearing/Vision screen Hearing Screening - Comments:: Has decreased hearing but stable per pt  Vision Screening - Comments:: Up to date with routine eye exams with [facility]   Goals Addressed   None    Depression Screen     02/17/2024    1:07 PM 01/30/2024    1:49 PM 12/28/2023    2:43 PM 12/21/2023    3:11 PM 11/23/2023    2:49 PM 06/17/2023    3:16 PM 02/04/2023    2:24 PM  PHQ 2/9 Scores  PHQ - 2  Score 3 0 5 2 6  0 0  PHQ- 9 Score 3 0 18 8 11  0 0    Fall Risk     01/30/2024    1:49 PM 12/28/2023    2:42 PM 12/20/2023    4:23 PM 11/23/2023    2:51 PM 11/23/2023    2:49 PM  Fall Risk   Falls in the past year? 0 0 0 1 0  Number falls in past yr: 0 0 0 0 0  Injury with Fall? 0 0 0 0 0  Follow up Falls evaluation completed Falls evaluation completed  Falls evaluation completed Falls evaluation completed    MEDICARE RISK AT HOME:  Medicare Risk at Home Any stairs in or around the home?: Yes If so, are there any without handrails?: No Home free of loose throw rugs in walkways, pet beds, electrical cords, etc?: Yes Adequate lighting in your home to reduce risk of falls?: Yes Life alert?: Yes (apple watch) Use of a cane, walker or w/c?: No Grab bars in the bathroom?: No Shower chair or bench in shower?: No Elevated toilet seat or a handicapped toilet?: Yes  TIMED UP AND GO:  Was the test performed?  No  Cognitive Function: 6CIT completed        02/17/2024    1:11 PM 09/18/2021    2:30 PM  6CIT Screen  What Year? 0 points 0 points  What month? 0 points 0 points  What time? 0 points 0 points  Count back from 20 0 points 0 points  Months in reverse 0 points 2 points  Repeat phrase 0 points 0 points  Total Score 0 points 2 points    Immunizations Immunization History  Administered Date(s) Administered   Fluad Quad(high Dose 65+) 01/20/2021, 12/23/2021   Fluad Trivalent(High Dose 65+) 02/04/2023   INFLUENZA, HIGH DOSE SEASONAL PF 12/11/2014, 12/24/2015, 12/22/2016, 12/21/2017, 12/04/2018, 12/05/2018, 01/23/2020, 12/28/2023   Influenza, Seasonal, Injecte, Preservative Fre 12/24/2015   Influenza-Unspecified 12/19/2015   PFIZER Comirnaty (Gray Top)Covid-19 Tri-Sucrose Vaccine 09/17/2020   PFIZER(Purple Top)SARS-COV-2 Vaccination 05/17/2019, 06/12/2019, 02/11/2020   Pfizer Covid-19 Vaccine Bivalent Booster 31yrs & up 01/20/2021   Pfizer(Comirnaty )Fall Seasonal Vaccine 12  years and older 02/08/2022, 04/22/2023, 01/10/2024   Pneumococcal Conjugate-13 07/25/2013   Pneumococcal Polysaccharide-23 08/17/2010, 03/17/2020   Pneumococcal-Unspecified 01/09/2014   Tdap 07/26/2011, 11/02/2021   Zoster Recombinant(Shingrix ) 05/27/2021, 11/02/2021   Zoster, Live 03/11/2014    Screening Tests Health Maintenance  Topic Date Due   DEXA SCAN  01/22/2021   Medicare Annual Wellness (AWV)  09/19/2022   FOOT EXAM  01/04/2024   HEMOGLOBIN A1C  01/17/2024   Diabetic kidney evaluation - Urine ACR  06/16/2024   COVID-19 Vaccine (9 - Pfizer risk 2025-26 season) 07/09/2024   OPHTHALMOLOGY EXAM  08/28/2024   Diabetic kidney evaluation - eGFR measurement  12/15/2024   Colonoscopy  06/02/2026   DTaP/Tdap/Td (3 - Td or Tdap) 11/03/2031   Pneumococcal Vaccine: 50+ Years  Completed   Influenza Vaccine  Completed   Hepatitis C Screening  Completed   Zoster Vaccines- Shingrix   Completed   Meningococcal B Vaccine  Aged Out   Mammogram  Discontinued    Health Maintenance Items Addressed: Mammogram and DEXA ordered. Will completed DM foot exam and A1c at next OV.  Additional Screening:  Vision Screening: Recommended annual ophthalmology exams for early detection of glaucoma and other disorders of the eye. Is the patient up to date with their annual eye exam?  Yes  Who is the provider or what is the name of the office in which the patient attends annual eye exams? [facility]  Dental Screening: Recommended annual dental exams for proper oral hygiene  Community Resource Referral / Chronic Care Management: CRR required this visit?  Yes, care giver support via FindHelp  CCM required this visit?  No   Plan:    I have personally reviewed and noted the following in the patient's chart:   Medical and social history Use of alcohol, tobacco or illicit drugs  Current medications and supplements including opioid prescriptions. Patient is not currently taking opioid  prescriptions. Functional ability and status Nutritional status Physical activity Advanced directives List of other physicians Hospitalizations, surgeries, and ER visits in previous 12 months Vitals Screenings to include cognitive, depression, and falls Referrals and appointments  In addition, I have reviewed and discussed with patient certain preventive protocols, quality metrics, and best practice recommendations. A written personalized care plan for preventive services as well as general preventive health recommendations were provided to patient.   Lolita Libra, CMA   02/17/2024   After Visit Summary: (MyChart) Due to this being a telephonic visit, the after visit summary with patients personalized plan was offered to patient via  MyChart   Notes: see phone note

## 2024-02-17 NOTE — Telephone Encounter (Signed)
 Pt had AWV today. Reports spouse in rehab due to a stroke. Pt has been and continues to be his sole caregiver and does express some caregiver fatigue.  I provided information for potential caregiver relief via [employer] ([facility], [place], [phone]). She also expressed concern in transporting pt to appts once he is discharged from Rehab.  I advised her to speak with rehab / hospital staff regarding transportation options once his therapy is completed. She will let us  know if this is not successful. She also has diabetes follow up with you next week.

## 2024-02-21 ENCOUNTER — Encounter: Payer: Self-pay | Admitting: Family Medicine

## 2024-02-21 ENCOUNTER — Ambulatory Visit (INDEPENDENT_AMBULATORY_CARE_PROVIDER_SITE_OTHER): Admitting: Family Medicine

## 2024-02-21 VITALS — BP 120/72 | HR 76 | Temp 97.8°F | Resp 16 | Ht 62.0 in | Wt 187.0 lb

## 2024-02-21 DIAGNOSIS — I10 Essential (primary) hypertension: Secondary | ICD-10-CM

## 2024-02-21 DIAGNOSIS — Z794 Long term (current) use of insulin: Secondary | ICD-10-CM

## 2024-02-21 DIAGNOSIS — E1165 Type 2 diabetes mellitus with hyperglycemia: Secondary | ICD-10-CM | POA: Diagnosis not present

## 2024-02-21 NOTE — Progress Notes (Signed)
 Subjective:   Chief Complaint  Patient presents with   Diabetes    Diabetes     Kara Lee is a 78 y.o. female here for follow-up of diabetes.   Kara Lee's self monitored glucose range is 90-100's.  Patient denies hypoglycemic reactions. She checks her glucose levels several times per week.  Patient does require insulin .  Novolin 10 u qhs  Medications include: metformin  XR 500 mg bid, Mounjaro  7.5 mg/week Diet is fair.  Exercise: walking  Hypertension Patient presents for hypertension follow up. She does not monitor home blood pressures. She is compliant with medications- losartan  25 mg/d, atenolol  25 mg bid Patient has these side effects of medication: none Diet/exercise as above.  No Cp or SOB.  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 2nd child   Diabetes mellitus (HCC)    Diverticulosis    Fatty liver    GERD (gastroesophageal reflux disease)    Heart murmur    HLD (hyperlipidemia)    Hypertension    Morbid obesity (HCC)    OSA on CPAP    uses CPAP nightly   Osteoarthritis of right knee 07/31/2013   PONV (postoperative nausea and vomiting)    Rosacea    Severe aortic stenosis    Sleep apnea    Spinal stenosis      Related testing: Retinal exam: Done Pneumovax: done  Objective:  BP 120/72 (BP Location: Left Arm, Patient Position: Sitting)   Pulse 76   Temp 97.8 F (36.6 C) (Oral)   Resp 16   Ht 5' 2 (1.575 m)   Wt 187 lb (84.8 kg)   SpO2 97%   BMI 34.20 kg/m  General:  Well developed, well nourished, in no apparent distress Skin: excoriated area over lateral dorsum of 3rd digit on R, ecchymosis over dorsum of great toe on R; otherwise warm, no pallor or diaphoresis Head:  Normocephalic, atraumatic Eyes:  Pupils equal  and round, sclera anicteric without injection  Lungs:  CTAB, no access msc use Cardio:  RRR, no bruits, no LE edema Musculoskeletal:  Symmetrical muscle groups noted without atrophy or deformity Neuro:  Sensation intact to pinprick on feet Psych: Age appropriate judgment and insight  Assessment:   Type 2 diabetes mellitus with hyperglycemia, with long-term current use of insulin  (HCC) - Plan: Comprehensive metabolic panel with GFR, Lipid panel, Hemoglobin A1c  Essential hypertension   Plan:   Chronic, stable. Cont Metformin  XR 500 mg bid, Mounjaro  7.5 mg weekly, Novolin 10 u qhs. May stop ins if doing well. Counseled on diet and exercise. Chronic, stable. Cont losartan  25 mg/d, atenolol  25 mg bid.  F/u in 6 mo. The patient voiced understanding and agreement to the plan.  Mabel Mt [place], DO 02/21/24 4:34 PM

## 2024-02-21 NOTE — Patient Instructions (Signed)
 Give us  2-3 business days to get the results of your labs back.   Keep the diet clean and stay active.  Let us  know if you need anything.

## 2024-02-22 ENCOUNTER — Ambulatory Visit: Payer: Self-pay | Admitting: Family Medicine

## 2024-02-22 LAB — COMPREHENSIVE METABOLIC PANEL WITH GFR
ALT: 12 U/L (ref 0–35)
AST: 16 U/L (ref 0–37)
Albumin: 4.3 g/dL (ref 3.5–5.2)
Alkaline Phosphatase: 61 U/L (ref 39–117)
BUN: 15 mg/dL (ref 6–23)
CO2: 27 meq/L (ref 19–32)
Calcium: 9.5 mg/dL (ref 8.4–10.5)
Chloride: 103 meq/L (ref 96–112)
Creatinine, Ser: 0.66 mg/dL (ref 0.40–1.20)
GFR: 85.47 mL/min (ref >60.00)
Glucose, Bld: 76 mg/dL (ref 70–99)
Potassium: 4.9 meq/L (ref 3.5–5.1)
Sodium: 139 meq/L (ref 135–145)
Total Bilirubin: 0.5 mg/dL (ref 0.2–1.2)
Total Protein: 7.2 g/dL (ref 6.0–8.3)

## 2024-02-22 LAB — HEMOGLOBIN A1C: Hgb A1c MFr Bld: 6.2 % (ref 4.6–6.5)

## 2024-02-22 LAB — LIPID PANEL
Cholesterol: 134 mg/dL (ref 0–200)
HDL: 57.6 mg/dL (ref >39.00)
LDL Cholesterol: 53 mg/dL (ref 0–99)
NonHDL: 76.03
Total CHOL/HDL Ratio: 2
Triglycerides: 113 mg/dL (ref 0.0–149.0)
VLDL: 22.6 mg/dL (ref 0.0–40.0)

## 2024-02-22 MED ORDER — METFORMIN HCL ER 500 MG PO TB24: 500.0000 mg | ORAL_TABLET | Freq: Two times a day (BID) | ORAL | Status: AC

## 2024-02-24 ENCOUNTER — Ambulatory Visit: Payer: Self-pay

## 2024-02-24 NOTE — Telephone Encounter (Signed)
 Appt scheduled

## 2024-02-24 NOTE — Telephone Encounter (Signed)
 FYI Only or Action Required?: FYI only for provider: appointment scheduled on 11/11.- pt requested due to husband inpatient at [facility]  Patient was last seen in primary care on 02/21/2024 by Frann Mabel Mt, DO.  Called Nurse Triage reporting Rib Injury.  Symptoms began yesterday.  Interventions attempted: Rest, hydration, or home remedies.  Symptoms are: gradually worsening.  Triage Disposition: See Physician Within 24 Hours  Patient/caregiver understands and will follow disposition?: Unsure  Copied from CRM #[id number]. Topic: Clinical - Red Word Triage >> Feb 24, 2024  1:16 PM Leah C wrote: Red Word that prompted transfer to Nurse Triage: Patient hurt ribs real bad. Patient leaned over and felt something pop and it is severe pain. Right side, really sore. If patient takes a deep breath or move the wrong way it hurts. Even when laying down, it is sore.   Patient is not sure when she can come in for appts- she knows she can't do Monday or Wednesday Reason for Disposition  [1] MODERATE pain (e.g., interferes with normal activities) AND [2] high-risk adult (e.g., age > 60 years, osteoporosis, chronic steroid use)  Answer Assessment - Initial Assessment Questions Pt was bending over the center console in her car yesterday and heard a pop in her right rib cage. She feels like she might have cracked a rib. She denies any bruising, bleeding, SOB, CP, or dizziness. Denies coughing up blood or any abd pain. Pain ranging from 3/10-7/10 depending on movement. Hurts with deep breath but does not cause SOB. Advised to support the rib cage with deep breathing or coughing.   Patient is at [facility] with husband who is inpatient. She will go to UC/ED if anything worsens. Appt 11/11 with PCP office   1. MECHANISM: How did the injury happen?     Leaning over center console  2. ONSET: When did the injury happen? (.e.g., minutes, hours, days ago)     Yesterday  3. LOCATION: Where on the  chest is the injury located?     Right rib cage  4. APPEARANCE: What does the injury look like?     Denies  5. BLEEDING: Is there any bleeding now? If Yes, ask: How long has it been bleeding?     denies 6. SEVERITY: Any difficulty with breathing?     Denies SOB- just hard to get a deep breath  8. PAIN: Is there pain? If Yes, ask: How bad is the pain? (e.g., Scale 0-10; none, mild, moderate, severe)     7/10 at worst and 3 at best  Protocols used: Chest Injury-A-AH

## 2024-02-28 ENCOUNTER — Encounter: Payer: Self-pay | Admitting: Family Medicine

## 2024-02-28 ENCOUNTER — Ambulatory Visit (INDEPENDENT_AMBULATORY_CARE_PROVIDER_SITE_OTHER): Admitting: Family Medicine

## 2024-02-28 VITALS — BP 98/70 | HR 72 | Temp 97.7°F | Resp 16 | Ht 62.0 in | Wt 186.4 lb

## 2024-02-28 DIAGNOSIS — S298XXA Other specified injuries of thorax, initial encounter: Secondary | ICD-10-CM | POA: Diagnosis not present

## 2024-02-28 NOTE — Progress Notes (Signed)
 Subjective:    Patient ID: Kara Lee, female    DOB: Nov 28, 1945, 78 y.o.   MRN: [id number]  Chief Complaint  Patient presents with   Rib Injury    Right rib pain, pt states leaning over her center car console. Felt a pop. Pt states pain has improved.     HPI Patient is in today for rib pain. Discussed the use of AI scribe software for clinical note transcription with the patient, who gave verbal consent to proceed.  History of Present Illness Kara Lee is a 78 year old female who presents with rib pain after a recent injury.  Last week, she leaned over in her car and hit her ribs on the console, experiencing a movement or 'pop' in the area. Since then, she has not noticed any bruising or bleeding, and the pain has been improving.  She experiences pain when taking a deep breath, localized to the area where she hit her ribs. The pain was initially severe but has been getting better over time. No recent illness, cough, or other respiratory symptoms.  Her family encouraged her to seek medical attention despite her belief that it might be a muscle issue.  Her husband is currently in rehabilitation following a stroke, and she is his legal power of attorney.    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 2nd child   Diabetes mellitus (HCC)    Diverticulosis    Fatty liver    GERD (gastroesophageal reflux disease)    Heart murmur    HLD (hyperlipidemia)    Hypertension    Morbid obesity (HCC)    OSA on CPAP    uses CPAP nightly   Osteoarthritis of right knee 07/31/2013   PONV (postoperative nausea and vomiting)    Rosacea    Severe aortic stenosis    Sleep apnea    Spinal stenosis     Past Surgical History:  Procedure Laterality Date    ABDOMINAL HYSTERECTOMY     still has uterus   ANKLE RECONSTRUCTION Left    ARTERY BIOPSY Left 01/21/2020   Procedure: BIOPSY TEMPORAL ARTERY;  Surgeon: Karis Clunes, MD;  Location: [facility];  Service: ENT;  Laterality: Left;   BIOPSY BREAST Bilateral    BREAST SURGERY     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   CESAREAN SECTION     x 2   CHOLECYSTECTOMY     COLONOSCOPY  04/21/2016   Single small external hemorrhoid, small internal hemorrhoids, mild sigmoid diverticulosis, 8 mm rectal polyp removed with snare   EYE SURGERY     lasik; X3 for crossed eyes; 1 eyelid surgery   HERNIA REPAIR     INCISIONAL HERNIA REPAIR     JOINT REPLACEMENT     PARTIAL KNEE ARTHROPLASTY Right 07/31/2013   Procedure: RIGHT UNICOMPARTMENTAL KNEE;  Surgeon: Fonda SHAUNNA Olmsted, MD;  Location: [facility] OR;  Service: Orthopedics;  Laterality: Right;   RIGHT/LEFT HEART CATH AND CORONARY ANGIOGRAPHY N/A 10/04/2017   Procedure: RIGHT/LEFT HEART CATH AND CORONARY ANGIOGRAPHY;  Surgeon: Ladona Heinz, MD;  Location: [facility] INVASIVE CV LAB;  Service: Cardiovascular;  Laterality: N/A;   TEE WITHOUT  CARDIOVERSION N/A 08/31/2017   Procedure: TRANSESOPHAGEAL ECHOCARDIOGRAM (TEE);  Surgeon: Ladona Heinz, MD;  Location: [facility] ENDOSCOPY;  Service: Cardiovascular;  Laterality: N/A;   TEE WITHOUT CARDIOVERSION N/A 01/30/2019   Procedure: TRANSESOPHAGEAL ECHOCARDIOGRAM (TEE);  Surgeon: Wonda Sharper, MD;  Location: [facility] INVASIVE CV LAB;  Service: Open Heart Surgery;  Laterality: N/A;   TRANSCATHETER AORTIC VALVE REPLACEMENT, TRANSFEMORAL N/A 01/30/2019   Procedure: TRANSCATHETER AORTIC VALVE REPLACEMENT, TRANSFEMORAL;  Surgeon: Wonda Sharper, MD;  Location: [facility] INVASIVE CV LAB;  Service: Open Heart Surgery;  Laterality: N/A;   TUBAL LIGATION      Family History  Problem Relation Age of Onset   Pancreatitis Mother    COPD Mother    Diabetes type II Mother    Cancer Mother    Diabetes Mother    Heart disease Mother     Cirrhosis Sister        NASH   Allergic rhinitis Maternal Grandmother    Breast cancer Maternal Grandmother    Anxiety disorder Maternal Grandmother    Varicose Veins Maternal Grandmother    Cataracts Maternal Grandfather    Prostate cancer Maternal Grandfather    Heart disease Maternal Grandfather    Hearing loss Maternal Grandfather    Asthma Son    Kidney Stones Daughter    Heart disease Paternal Grandfather    Immunodeficiency Neg Hx    Angioedema Neg Hx    Eczema Neg Hx    Urticaria Neg Hx     Social History   Socioeconomic History   Marital status: Married    Spouse name: Not on file   Number of children: 2   Years of education: Not on file   Highest education level: 12th grade  Occupational History   Occupation: retired  Tobacco Use   Smoking status: Never   Smokeless tobacco: Never   Tobacco comments:    Never smoked 08/22/23  Vaping Use   Vaping status: Never Used  Substance and Sexual Activity   Alcohol use: Not Currently   Drug use: No   Sexual activity: Not Currently    Partners: Male    Birth control/protection: Surgical  Other Topics Concern   Not on file  Social History Narrative   Not on file   Social Drivers of Health   Financial Resource Strain: Low Risk  (02/20/2024)   Overall Financial Resource Strain (CARDIA)    Difficulty of Paying Living Expenses: Not hard at all  Food Insecurity: No Food Insecurity (02/20/2024)   Hunger Vital Sign    Worried About Running Out of Food in the Last Year: Never true    Ran Out of Food in the Last Year: Never true  Transportation Needs: No Transportation Needs (02/20/2024)   PRAPARE - Administrator, Civil Service (Medical): No    Lack of Transportation (Non-Medical): No  Physical Activity: Insufficiently Active (02/20/2024)   Exercise Vital Sign    Days of Exercise per Week: 5 days    Minutes of Exercise per Session: 20 min  Stress: No Stress Concern Present (02/20/2024)   [employer] of  Occupational Health - Occupational Stress Questionnaire    Feeling of Stress: Only a little  Recent Concern: Stress - Stress Concern Present (02/17/2024)   [employer] of Occupational Health - Occupational Stress Questionnaire    Feeling of Stress: To some extent  Social Connections: Unknown (02/20/2024)   Social Connection and Isolation Panel    Frequency of Communication with Friends and Family: Twice a week  Frequency of Social Gatherings with Friends and Family: Patient declined    Attends Religious Services: Patient declined    Database Administrator or Organizations: No    Attends Engineer, Structural: Not on file    Marital Status: Married  Recent Concern: Social Connections - Socially Isolated (02/17/2024)   Social Connection and Isolation Panel    Frequency of Communication with Friends and Family: Once a week    Frequency of Social Gatherings with Friends and Family: Once a week    Attends Religious Services: Never    Database Administrator or Organizations: No    Attends Banker Meetings: Never    Marital Status: Married  Catering Manager Violence: Not At Risk (02/17/2024)   Humiliation, Afraid, Rape, and Kick questionnaire    Fear of Current or Ex-Partner: No    Emotionally Abused: No    Physically Abused: No    Sexually Abused: No    Outpatient Medications Prior to Visit  Medication Sig Dispense Refill   acetaminophen  (TYLENOL ) 650 MG CR tablet Take 650 mg by mouth every 8 (eight) hours as needed for pain.     apixaban  (ELIQUIS ) 5 MG TABS tablet Take 1 tablet (5 mg total) by mouth 2 (two) times daily. 180 tablet 1   atenolol  (TENORMIN ) 25 MG tablet Take 1 tablet (25 mg total) by mouth 2 (two) times daily. 180 tablet 0   diltiazem  (CARDIZEM ) 30 MG tablet Take 1 tablet (30 mg total) by mouth every 6 (six) hours as needed. 45 tablet 3   docusate sodium  (COLACE) 100 MG capsule Take 100 mg by mouth every morning.     EPINEPHrine  (EPIPEN  2-PAK)  0.3 mg/0.3 mL IJ SOAJ injection Inject 0.3 mg into the muscle as needed for anaphylaxis. 2 each 1   losartan  (COZAAR ) 25 MG tablet Take 1 tablet (25 mg total) by mouth daily. 90 tablet 2   metFORMIN  (GLUCOPHAGE -XR) 500 MG 24 hr tablet Take 1 tablet (500 mg total) by mouth 2 (two) times daily with a meal.     methocarbamol  (ROBAXIN ) 500 MG tablet Take 1 tablet (500 mg total) by mouth every 6 (six) to 8 (eight) hours as needed. 35 tablet 1   mometasone  (NASONEX ) 50 MCG/ACT nasal spray Place 2 sprays into the nose daily. (Patient taking differently: Place 2 sprays into the nose daily as needed (Sinus allergies).) 17 g 5   omeprazole  (PRILOSEC) 40 MG capsule Take 1 capsule (40 mg total) by mouth daily. 90 capsule 2   Pitavastatin  Magnesium  (ZYPITAMAG ) 4 MG TABS TAKE 1 TABLET BY MOUTH EVERY DAY 90 tablet 3   SYSTANE COMPLETE PF 0.6 % SOLN Place 1 drop into both eyes in the morning and at bedtime. Instill 1 drop into both eyes two times a day     tirzepatide  (MOUNJARO ) 7.5 MG/0.5ML Pen Inject 7.5 mg into the skin once a week. 2 mL 3   No facility-administered medications prior to visit.    Allergies  Allergen Reactions   Bee Venom Itching and Swelling   Cipro  [Ciprofloxacin  Hcl] Rash   Glimepiride Rash   Latex Itching and Swelling   Metronidazole  Rash   Shellfish Allergy Itching   Statins Other (See Comments)    Muscle aches   Tape Itching    Bandaids and adhesive tape    Review of Systems  Constitutional:  Negative for fever and malaise/fatigue.  HENT:  Negative for congestion.   Eyes:  Negative for blurred vision.  Respiratory:  Negative for shortness of breath.   Cardiovascular:  Negative for chest pain, palpitations and leg swelling.  Gastrointestinal:  Negative for abdominal pain, blood in stool and nausea.  Genitourinary:  Negative for dysuria and frequency.  Musculoskeletal:  Negative for falls.  Skin:  Negative for rash.  Neurological:  Negative for dizziness, loss of  consciousness and headaches.  Endo/Heme/Allergies:  Negative for environmental allergies.  Psychiatric/Behavioral:  Negative for depression. The patient is not nervous/anxious.        Objective:    Physical Exam Vitals and nursing note reviewed.  Constitutional:      General: She is not in acute distress.    Appearance: Normal appearance. She is well-developed.  HENT:     Head: Normocephalic and atraumatic.  Eyes:     General: No scleral icterus.       Right eye: No discharge.        Left eye: No discharge.  Cardiovascular:     Rate and Rhythm: Normal rate and regular rhythm.     Heart sounds: No murmur heard. Pulmonary:     Effort: Pulmonary effort is normal. No respiratory distress.     Breath sounds: Normal breath sounds.  Musculoskeletal:        General: Tenderness present. Normal range of motion.       Arms:     Cervical back: Normal range of motion and neck supple.     Right lower leg: No edema.     Left lower leg: No edema.     Comments: Pain ant/ lat ribs just under R breast --- tender to touch No bruising   Skin:    General: Skin is warm and dry.  Neurological:     Mental Status: She is alert and oriented to person, place, and time.  Psychiatric:        Mood and Affect: Mood normal.        Behavior: Behavior normal.        Thought Content: Thought content normal.        Judgment: Judgment normal.     BP 98/70 (BP Location: Left Arm, Patient Position: Sitting, Cuff Size: Large)   Pulse 72   Temp 97.7 F (36.5 C) (Oral)   Resp 16   Ht 5' 2 (1.575 m)   Wt 186 lb 6.4 oz (84.6 kg)   SpO2 98%   BMI 34.09 kg/m  Wt Readings from Last 3 Encounters:  02/28/24 186 lb 6.4 oz (84.6 kg)  02/21/24 187 lb (84.8 kg)  02/17/24 189 lb (85.7 kg)    Diabetic Foot Exam - Simple   No data filed    Lab Results  Component Value Date   WBC 9.4 12/16/2023   HGB 12.2 12/16/2023   HCT 36.0 12/16/2023   PLT 187 12/16/2023   GLUCOSE 76 02/21/2024   CHOL 134  02/21/2024   TRIG 113.0 02/21/2024   HDL 57.60 02/21/2024   LDLCALC 53 02/21/2024   ALT 12 02/21/2024   AST 16 02/21/2024   NA 139 02/21/2024   K 4.9 02/21/2024   CL 103 02/21/2024   CREATININE 0.66 02/21/2024   BUN 15 02/21/2024   CO2 27 02/21/2024   TSH 1.248 12/20/2022   INR 1.1 11/14/2023   HGBA1C 6.2 02/21/2024   MICROALBUR 4.2 06/17/2023    Lab Results  Component Value Date   TSH 1.248 12/20/2022   Lab Results  Component Value Date   WBC 9.4 12/16/2023   HGB 12.2  12/16/2023   HCT 36.0 12/16/2023   MCV 96.1 12/16/2023   PLT 187 12/16/2023   Lab Results  Component Value Date   NA 139 02/21/2024   K 4.9 02/21/2024   CO2 27 02/21/2024   GLUCOSE 76 02/21/2024   BUN 15 02/21/2024   CREATININE 0.66 02/21/2024   BILITOT 0.5 02/21/2024   ALKPHOS 61 02/21/2024   AST 16 02/21/2024   ALT 12 02/21/2024   PROT 7.2 02/21/2024   ALBUMIN  4.3 02/21/2024   CALCIUM 9.5 02/21/2024   ANIONGAP 10 12/16/2023   EGFR 91 12/12/2023   GFR 85.47 02/21/2024   Lab Results  Component Value Date   CHOL 134 02/21/2024   Lab Results  Component Value Date   HDL 57.60 02/21/2024   Lab Results  Component Value Date   LDLCALC 53 02/21/2024   Lab Results  Component Value Date   TRIG 113.0 02/21/2024   Lab Results  Component Value Date   CHOLHDL 2 02/21/2024   Lab Results  Component Value Date   HGBA1C 6.2 02/21/2024       Assessment & Plan:  Rib contusion, right, initial encounter  Assessment and Plan Assessment & Plan Thoracic wall injury   The injury is likely due to trauma from leaning over in the car, causing a popping sensation and mild pain with deep breaths. There is no bruising or bleeding. Symptoms are improving, suggesting a minor injury such as a bruise or muscle strain. A rib fracture is considered but unlikely given the improvement. An X-ray is not indicated unless severe pain or worsening symptoms occur. Use ice for pain relief and take Tylenol  as needed.  Report if symptoms worsen or do not improve.    Yanett Conkright R Lowne Chase, DO

## 2024-02-28 NOTE — Patient Instructions (Signed)
 Rib Contusion A rib contusion is a deep bruise on the rib area. Contusions are the result of a blunt trauma that causes bleeding and injury to the tissues under the skin. A rib contusion may involve bruising of the ribs and of the skin and muscles in the area. The skin over the contusion may turn blue, purple, or yellow. Minor injuries result in a painless contusion. More severe contusions may be painful and swollen for a few weeks. What are the causes? This condition is usually caused by a hard, direct hit to an area of the body. This often occurs while playing contact sports. What are the signs or symptoms? Symptoms of this condition include: Swelling and redness of the injured area. Discoloration of the injured area. Tenderness and soreness of the injured area. Pain with or without movement. Pain when breathing in. How is this diagnosed? This condition may be diagnosed based on: Your symptoms and medical history. A physical exam. Imaging tests--such as an X-ray, CT scan, or MRI--to determine if there were internal injuries or broken bones (fractures). How is this treated? This condition may be treated with: Rest. This is often the best treatment for a rib contusion. Ice packs. This reduces swelling and inflammation. Deep-breathing exercises. These may be recommended to reduce the risk for lung collapse and pneumonia. Medicines. Over-the-counter or prescription medicines may be given to control pain. Injection of a numbing medicine around the nerve near your injury (nerve block). Follow these instructions at home: Medicines Take over-the-counter and prescription medicines only as told by your health care provider. Ask your health care provider if the medicine prescribed to you: Requires you to avoid driving or using machinery. Can cause constipation. You may need to take these actions to prevent or treat constipation: Drink enough fluid to keep your urine pale yellow. Take  over-the-counter or prescription medicines. Eat foods that are high in fiber, such as beans, whole grains, and fresh fruits and vegetables. Limit foods that are high in fat and processed sugars, such as fried or sweet foods. Managing pain, stiffness, and swelling If directed, put ice on the injured area. To do this: Put ice in a plastic bag. Place a towel between your skin and the bag. Leave the ice on for 20 minutes, 2-3 times a day. Remove the ice if your skin turns bright red. This is very important. If you cannot feel pain, heat, or cold, you have a greater risk of damage to the area.  Activity Rest the injured area. Avoid strenuous activity and any activities or movements that cause pain. Be careful during activities, and avoid bumping the injured area. Do not lift anything that is heavier than 5 lb (2.3 kg), or the limit that you are told, until your health care provider says that it is safe. General instructions  Do not use any products that contain nicotine or tobacco, such as cigarettes, e-cigarettes, and chewing tobacco. These can delay healing. If you need help quitting, ask your health care provider. Do deep-breathing exercises as told by your health care provider. If you were given an incentive spirometer, use it every 1-2 hours while you are awake, or as recommended by your health care provider. This device measures how well you are filling your lungs with each breath. Keep all follow-up visits. This is important. Contact a health care provider if you have: Increased bruising or swelling. Pain that is not controlled with treatment. A fever. Get help right away if you: Have difficulty breathing or  shortness of breath. Develop a continual cough, or you cough up thick or bloody mucus from your lungs (sputum). Feel nauseous or you vomit. Have pain in your abdomen. These symptoms may represent a serious problem that is an emergency. Do not wait to see if the symptoms will go  away. Get medical help right away. Call your local emergency services (911 in the [place] Do not drive yourself to the hospital. Summary A rib contusion is a deep bruise on your rib area. Contusions are the result of a blunt trauma that causes bleeding and injury to the tissues under the skin. The skin over the contusion may turn blue, purple, or yellow. Minor injuries may cause a painless contusion. More severe contusions may be painful and swollen for a few weeks. Rest the injured area. Avoid strenuous activity and any activities or movements that cause pain. This information is not intended to replace advice given to you by your health care provider. Make sure you discuss any questions you have with your health care provider. Document Revised: 02/21/2023 Document Reviewed: 02/21/2023 Elsevier Patient Education  2024 [employer].

## 2024-03-05 ENCOUNTER — Encounter: Payer: Self-pay | Admitting: Family Medicine

## 2024-03-05 ENCOUNTER — Other Ambulatory Visit (HOSPITAL_BASED_OUTPATIENT_CLINIC_OR_DEPARTMENT_OTHER): Payer: Self-pay

## 2024-03-05 ENCOUNTER — Other Ambulatory Visit: Payer: Self-pay | Admitting: Family Medicine

## 2024-03-05 DIAGNOSIS — M797 Fibromyalgia: Secondary | ICD-10-CM

## 2024-03-05 MED ORDER — DULOXETINE HCL 60 MG PO CPEP
60.0000 mg | ORAL_CAPSULE | Freq: Every day | ORAL | 2 refills | Status: DC
  Filled 2024-03-05: qty 30, 30d supply, fill #0
  Filled 2024-03-30: qty 30, 30d supply, fill #1

## 2024-03-05 NOTE — Telephone Encounter (Signed)
 Refill sent.

## 2024-03-05 NOTE — Telephone Encounter (Signed)
 Please verify with the patient that she is indeed taking it and see if she is taking it once or twice daily.  Thank you.

## 2024-03-09 ENCOUNTER — Ambulatory Visit (INDEPENDENT_AMBULATORY_CARE_PROVIDER_SITE_OTHER): Admitting: Family Medicine

## 2024-03-09 ENCOUNTER — Other Ambulatory Visit (HOSPITAL_BASED_OUTPATIENT_CLINIC_OR_DEPARTMENT_OTHER): Payer: Self-pay

## 2024-03-09 ENCOUNTER — Encounter: Payer: Self-pay | Admitting: Family Medicine

## 2024-03-09 VITALS — BP 122/70 | HR 83 | Temp 98.0°F | Resp 16 | Ht 62.0 in | Wt 186.8 lb

## 2024-03-09 DIAGNOSIS — M542 Cervicalgia: Secondary | ICD-10-CM | POA: Diagnosis not present

## 2024-03-09 DIAGNOSIS — H0015 Chalazion left lower eyelid: Secondary | ICD-10-CM | POA: Diagnosis not present

## 2024-03-09 MED ORDER — NEOMYCIN-POLYMYXIN-DEXAMETH 0.1 % OP SUSP
1.0000 [drp] | Freq: Four times a day (QID) | OPHTHALMIC | 0 refills | Status: AC
  Filled 2024-03-09: qty 5, 10d supply, fill #0

## 2024-03-09 MED ORDER — TIZANIDINE HCL 4 MG PO TABS
4.0000 mg | ORAL_TABLET | Freq: Four times a day (QID) | ORAL | 0 refills | Status: AC | PRN
  Filled 2024-03-09: qty 21, 6d supply, fill #0

## 2024-03-09 MED ORDER — CYCLOSPORINE 0.05 % OP EMUL
1.0000 [drp] | Freq: Two times a day (BID) | OPHTHALMIC | 5 refills | Status: DC
  Filled 2024-03-09: qty 60, 30d supply, fill #0
  Filled 2024-04-16: qty 60, 30d supply, fill #1

## 2024-03-09 NOTE — Progress Notes (Signed)
 Musculoskeletal Exam  Patient: Kara Lee DOB: 1945/07/07  DOS: 03/09/2024  SUBJECTIVE:  Chief Complaint:   Chief Complaint  Patient presents with   Follow-up    Follow Up    Kara Lee is a 78 y.o.  female for evaluation and treatment of L shoulder pain.   Onset:  4 days ago. Moving things at a nursing home.  Location: L trap Character:  aching  Progression of issue:  has worsened Associated symptoms: no bruising, redness, swelling Treatment: to date has been acetaminophen  and massage.   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 2nd child   Diabetes mellitus (HCC)    Diverticulosis    Fatty liver    GERD (gastroesophageal reflux disease)    Heart murmur    HLD (hyperlipidemia)    Hypertension    Morbid obesity (HCC)    OSA on CPAP    uses CPAP nightly   Osteoarthritis of right knee 07/31/2013   PONV (postoperative nausea and vomiting)    Rosacea    Severe aortic stenosis    Sleep apnea    Spinal stenosis     Objective: VITAL SIGNS: BP 122/70 (BP Location: Left Arm, Patient Position: Sitting)   Pulse 83   Temp 98 F (36.7 C) (Oral)   Resp 16   Ht 5' 2 (1.575 m)   Wt 186 lb 12.8 oz (84.7 kg)   SpO2 98%   BMI 34.17 kg/m  Constitutional: Well formed, well developed. No acute distress. Thorax & Lungs: No accessory muscle use Musculoskeletal: L trap.   Tenderness to palpation: Not over the trapezius but over the lateral omohyoid musculature Deformity: no Ecchymosis: no Negative Spurling's bilaterally Neurologic: Normal sensory function. No focal deficits noted. DTR's equal and symmetric in UE's. No clonus.  Grip strength adequate bilaterally Psychiatric: Normal mood. Age appropriate judgment  and insight. Alert & oriented x 3.    Assessment:  Acute neck pain - Plan: tiZANidine  (ZANAFLEX ) 4 MG tablet  Plan: Stretches/exercises, heat, ice, Tylenol .  Nonsedating muscle relaxer as above.  Warned about potential drowsiness with this. F/u as originally scheduled. The patient voiced understanding and agreement to the plan.   Kara [place], DO 03/09/24  12:15 PM

## 2024-03-09 NOTE — Patient Instructions (Signed)
 Heat (pad or rice pillow in microwave) over affected area, 10-15 minutes twice daily.   Ice/cold pack over area for 10-15 min twice daily.  OK to take Tylenol  1000 mg (2 extra strength tabs) or 975 mg (3 regular strength tabs) every 6 hours as needed.  Take Zanaflex  (tizanidine ) 1-2 hours before planned bedtime. If it makes you drowsy, do not take during the day. You can try half a tab the following night.  Let us  know if you need anything.  EXERCISES RANGE OF MOTION (ROM) AND STRETCHING EXERCISES  These exercises may help you when beginning to rehabilitate your issue. In order to successfully resolve your symptoms, you must improve your posture. These exercises are designed to help reduce the forward-head and rounded-shoulder posture which contributes to this condition. Your symptoms may resolve with or without further involvement from your physician, physical therapist or athletic trainer. While completing these exercises, remember:  Restoring tissue flexibility helps normal motion to return to the joints. This allows healthier, less painful movement and activity. An effective stretch should be held for at least 20 seconds, although you may need to begin with shorter hold times for comfort. A stretch should never be painful. You should only feel a gentle lengthening or release in the stretched tissue. Do not do any stretch or exercise that you cannot tolerate.  STRETCH- Axial Extensors Lie on your back on the floor. You may bend your knees for comfort. Place a rolled-up hand towel or dish towel, about 2 inches in diameter, under the part of your head that makes contact with the floor. Gently tuck your chin, as if trying to make a double chin, until you feel a gentle stretch at the base of your head. Hold 15-20 seconds. Repeat 2-3 times. Complete this exercise 1 time per day.   STRETCH - Axial Extension  Stand or sit on a firm surface. Assume a good posture: chest up, shoulders drawn  back, abdominal muscles slightly tense, knees unlocked (if standing) and feet hip width apart. Slowly retract your chin so your head slides back and your chin slightly lowers. Continue to look straight ahead. You should feel a gentle stretch in the back of your head. Be certain not to feel an aggressive stretch since this can cause headaches later. Hold for 15-20 seconds. Repeat 2-3 times. Complete this exercise 1 time per day.  STRETCH - Cervical Side Bend  Stand or sit on a firm surface. Assume a good posture: chest up, shoulders drawn back, abdominal muscles slightly tense, knees unlocked (if standing) and feet hip width apart. Without letting your nose or shoulders move, slowly tip your right / left ear to your shoulder until your feel a gentle stretch in the muscles on the opposite side of your neck. Hold 15-20 seconds. Repeat 2-3 times. Complete this exercise 1-2 times per day.  STRETCH - Cervical Rotators  Stand or sit on a firm surface. Assume a good posture: chest up, shoulders drawn back, abdominal muscles slightly tense, knees unlocked (if standing) and feet hip width apart. Keeping your eyes level with the ground, slowly turn your head until you feel a gentle stretch along the back and opposite side of your neck. Hold 15-20 seconds. Repeat 2-3 times. Complete this exercise 1-2 times per day.  RANGE OF MOTION - Neck Circles  Stand or sit on a firm surface. Assume a good posture: chest up, shoulders drawn back, abdominal muscles slightly tense, knees unlocked (if standing) and feet hip width apart. Gently  roll your head down and around from the back of one shoulder to the back of the other. The motion should never be forced or painful. Repeat the motion 10-20 times, or until you feel the neck muscles relax and loosen. Repeat 2-3 times. Complete the exercise 1-2 times per day. STRENGTHENING EXERCISES - Cervical Strain and Sprain These exercises may help you when beginning to  rehabilitate your injury. They may resolve your symptoms with or without further involvement from your physician, physical therapist, or athletic trainer. While completing these exercises, remember:  Muscles can gain both the endurance and the strength needed for everyday activities through controlled exercises. Complete these exercises as instructed by your physician, physical therapist, or athletic trainer. Progress the resistance and repetitions only as guided. You may experience muscle soreness or fatigue, but the pain or discomfort you are trying to eliminate should never worsen during these exercises. If this pain does worsen, stop and make certain you are following the directions exactly. If the pain is still present after adjustments, discontinue the exercise until you can discuss the trouble with your clinician.  STRENGTH - Cervical Flexors, Isometric Face a wall, standing about 6 inches away. Place a small pillow, a ball about 6-8 inches in diameter, or a folded towel between your forehead and the wall. Slightly tuck your chin and gently push your forehead into the soft object. Push only with mild to moderate intensity, building up tension gradually. Keep your jaw and forehead relaxed. Hold 10 to 20 seconds. Keep your breathing relaxed. Release the tension slowly. Relax your neck muscles completely before you start the next repetition. Repeat 2-3 times. Complete this exercise 1 time per day.  STRENGTH- Cervical Lateral Flexors, Isometric  Stand about 6 inches away from a wall. Place a small pillow, a ball about 6-8 inches in diameter, or a folded towel between the side of your head and the wall. Slightly tuck your chin and gently tilt your head into the soft object. Push only with mild to moderate intensity, building up tension gradually. Keep your jaw and forehead relaxed. Hold 10 to 20 seconds. Keep your breathing relaxed. Release the tension slowly. Relax your neck muscles completely  before you start the next repetition. Repeat 2-3 times. Complete this exercise 1 time per day.  STRENGTH - Cervical Extensors, Isometric  Stand about 6 inches away from a wall. Place a small pillow, a ball about 6-8 inches in diameter, or a folded towel between the back of your head and the wall. Slightly tuck your chin and gently tilt your head back into the soft object. Push only with mild to moderate intensity, building up tension gradually. Keep your jaw and forehead relaxed. Hold 10 to 20 seconds. Keep your breathing relaxed. Release the tension slowly. Relax your neck muscles completely before you start the next repetition. Repeat 2-3 times. Complete this exercise 1 time per day.  POSTURE AND BODY MECHANICS CONSIDERATIONS Keeping correct posture when sitting, standing or completing your activities will reduce the stress put on different body tissues, allowing injured tissues a chance to heal and limiting painful experiences. The following are general guidelines for improved posture. Your physician or physical therapist will provide you with any instructions specific to your needs. While reading these guidelines, remember: The exercises prescribed by your provider will help you have the flexibility and strength to maintain correct postures. The correct posture provides the optimal environment for your joints to work. All of your joints have less wear and tear when  properly supported by a spine with good posture. This means you will experience a healthier, less painful body. Correct posture must be practiced with all of your activities, especially prolonged sitting and standing. Correct posture is as important when doing repetitive low-stress activities (typing) as it is when doing a single heavy-load activity (lifting).  PROLONGED STANDING WHILE SLIGHTLY LEANING FORWARD When completing a task that requires you to lean forward while standing in one place for a long time, place either foot up on  a stationary 2- to 4-inch high object to help maintain the best posture. When both feet are on the ground, the low back tends to lose its slight inward curve. If this curve flattens (or becomes too large), then the back and your other joints will experience too much stress, fatigue more quickly, and can cause pain.   RESTING POSITIONS Consider which positions are most painful for you when choosing a resting position. If you have pain with flexion-based activities (sitting, bending, stooping, squatting), choose a position that allows you to rest in a less flexed posture. You would want to avoid curling into a fetal position on your side. If your pain worsens with extension-based activities (prolonged standing, working overhead), avoid resting in an extended position such as sleeping on your stomach. Most people will find more comfort when they rest with their spine in a more neutral position, neither too rounded nor too arched. Lying on a non-sagging bed on your side with a pillow between your knees, or on your back with a pillow under your knees will often provide some relief. Keep in mind, being in any one position for a prolonged period of time, no matter how correct your posture, can still lead to stiffness.  WALKING Walk with an upright posture. Your ears, shoulders, and hips should all line up. OFFICE WORK When working at a desk, create an environment that supports good, upright posture. Without extra support, muscles fatigue and lead to excessive strain on joints and other tissues.  CHAIR: A chair should be able to slide under your desk when your back makes contact with the back of the chair. This allows you to work closely. The chair's height should allow your eyes to be level with the upper part of your monitor and your hands to be slightly lower than your elbows. Body position: Your feet should make contact with the floor. If this is not possible, use a foot rest. Keep your ears over your  shoulders. This will reduce stress on your neck and low back.

## 2024-03-12 ENCOUNTER — Ambulatory Visit: Admitting: Family Medicine

## 2024-03-13 ENCOUNTER — Telehealth: Payer: Self-pay

## 2024-03-13 DIAGNOSIS — I471 Supraventricular tachycardia, unspecified: Secondary | ICD-10-CM

## 2024-03-13 DIAGNOSIS — I480 Paroxysmal atrial fibrillation: Secondary | ICD-10-CM

## 2024-03-13 NOTE — Telephone Encounter (Signed)
-----   Message from Nurse Carlyle C sent at 02/03/2024  1:36 PM EDT ----- Regarding: 12/22 afib/svt ablation Precert:  MD: Kennyth Type of ablation: A-fib/SVT Diagnosis: A-fib/SVT CPT code: A-fib (06343) + 93655 Ablation scheduled (date/time): 12/22 at   Procedure:  Added to calendar? Yes Orders entered? Yes Letter complete? No, >30 days before procedure Scheduled with cath lab? Yes Any medications to hold? Yes (please list hold instructions): Mounjaro  1 week, diltiazem  and atenolol  for 3 days Labs ordered (CBC, BMET, PT/INR if on warfarin): Yes Mapping system: Doesn't matter CARTO/OPAL rep notified? No Cardiac CT needed? No Dye allergy? No Pre-meds ordered and instructions given? No, not needed Letter method: MyChart H&P: 8/30 Device: No  Follow-up:  Cassie/Angel, please schedule Routine.  Covering RN - please send this message to [employer], EP scheduler, EP Scheduling pool, EP [employer], and CT scheduler (Brittany Lynch/Stephanie Mogg), if indicated.

## 2024-03-16 ENCOUNTER — Other Ambulatory Visit (HOSPITAL_BASED_OUTPATIENT_CLINIC_OR_DEPARTMENT_OTHER): Payer: Self-pay

## 2024-03-19 ENCOUNTER — Telehealth: Payer: Self-pay | Admitting: Cardiology

## 2024-03-19 NOTE — Telephone Encounter (Signed)
 Pt calling to reschedule ablation. Please advise.

## 2024-03-19 NOTE — Telephone Encounter (Signed)
 Spoke with the patient who states that her husband just got back home after having another stroke. She is his only caretaker right now so she is unable to proceed at this time. She is hoping to get some in-home help for him soon. Procedure has been rescheduled to 1/28.

## 2024-03-30 ENCOUNTER — Other Ambulatory Visit: Payer: Self-pay | Admitting: Family Medicine

## 2024-03-30 ENCOUNTER — Other Ambulatory Visit: Payer: Self-pay | Admitting: Cardiology

## 2024-03-30 DIAGNOSIS — E1169 Type 2 diabetes mellitus with other specified complication: Secondary | ICD-10-CM

## 2024-03-30 DIAGNOSIS — E669 Obesity, unspecified: Secondary | ICD-10-CM

## 2024-04-02 ENCOUNTER — Other Ambulatory Visit (HOSPITAL_BASED_OUTPATIENT_CLINIC_OR_DEPARTMENT_OTHER): Payer: Self-pay

## 2024-04-02 ENCOUNTER — Other Ambulatory Visit: Payer: Self-pay

## 2024-04-02 ENCOUNTER — Ambulatory Visit (HOSPITAL_BASED_OUTPATIENT_CLINIC_OR_DEPARTMENT_OTHER)
Admission: RE | Admit: 2024-04-02 | Discharge: 2024-04-02 | Disposition: A | Source: Ambulatory Visit | Attending: Family Medicine | Admitting: Family Medicine

## 2024-04-02 ENCOUNTER — Ambulatory Visit: Payer: Self-pay | Admitting: Family Medicine

## 2024-04-02 ENCOUNTER — Encounter (HOSPITAL_BASED_OUTPATIENT_CLINIC_OR_DEPARTMENT_OTHER): Payer: Self-pay

## 2024-04-02 ENCOUNTER — Inpatient Hospital Stay (HOSPITAL_BASED_OUTPATIENT_CLINIC_OR_DEPARTMENT_OTHER)
Admission: RE | Admit: 2024-04-02 | Discharge: 2024-04-02 | Disposition: A | Source: Ambulatory Visit | Attending: Family Medicine

## 2024-04-02 DIAGNOSIS — Z780 Asymptomatic menopausal state: Secondary | ICD-10-CM

## 2024-04-02 DIAGNOSIS — Z1231 Encounter for screening mammogram for malignant neoplasm of breast: Secondary | ICD-10-CM | POA: Insufficient documentation

## 2024-04-02 DIAGNOSIS — M8580 Other specified disorders of bone density and structure, unspecified site: Secondary | ICD-10-CM

## 2024-04-02 DIAGNOSIS — M85852 Other specified disorders of bone density and structure, left thigh: Secondary | ICD-10-CM | POA: Diagnosis not present

## 2024-04-02 DIAGNOSIS — M85851 Other specified disorders of bone density and structure, right thigh: Secondary | ICD-10-CM | POA: Diagnosis not present

## 2024-04-02 MED ORDER — MOUNJARO 7.5 MG/0.5ML ~~LOC~~ SOAJ
7.5000 mg | SUBCUTANEOUS | 1 refills | Status: DC
  Filled 2024-04-02 (×2): qty 2, 28d supply, fill #0
  Filled 2024-04-16 – 2024-04-23 (×2): qty 2, 28d supply, fill #1

## 2024-04-02 MED ORDER — ATENOLOL 25 MG PO TABS
25.0000 mg | ORAL_TABLET | Freq: Two times a day (BID) | ORAL | 0 refills | Status: DC
  Filled 2024-04-02: qty 30, 15d supply, fill #0

## 2024-04-04 ENCOUNTER — Ambulatory Visit: Admitting: Family Medicine

## 2024-04-04 ENCOUNTER — Other Ambulatory Visit (HOSPITAL_BASED_OUTPATIENT_CLINIC_OR_DEPARTMENT_OTHER): Payer: Self-pay

## 2024-04-04 ENCOUNTER — Encounter: Payer: Self-pay | Admitting: Family Medicine

## 2024-04-04 VITALS — BP 124/72 | HR 85 | Temp 97.5°F | Resp 16 | Ht 62.0 in

## 2024-04-04 DIAGNOSIS — M65342 Trigger finger, left ring finger: Secondary | ICD-10-CM

## 2024-04-04 DIAGNOSIS — F411 Generalized anxiety disorder: Secondary | ICD-10-CM | POA: Diagnosis not present

## 2024-04-04 DIAGNOSIS — M791 Myalgia, unspecified site: Secondary | ICD-10-CM

## 2024-04-04 MED ORDER — DULOXETINE HCL 60 MG PO CPEP
60.0000 mg | ORAL_CAPSULE | Freq: Two times a day (BID) | ORAL | 2 refills | Status: DC
  Filled 2024-04-04: qty 60, 30d supply, fill #0

## 2024-04-04 MED ORDER — METHYLPREDNISOLONE ACETATE 40 MG/ML IJ SUSP
40.0000 mg | Freq: Once | INTRAMUSCULAR | Status: AC
  Administered 2024-04-04: 11:00:00 40 mg via INTRAMUSCULAR

## 2024-04-04 NOTE — Patient Instructions (Signed)
 Try the tizanidine .   Heat (pad or rice pillow in microwave) over affected area, 10-15 minutes twice daily.   Ice/cold pack over area for 10-15 min twice daily.  OK to take Tylenol  1000 mg (2 extra strength tabs) or 975 mg (3 regular strength tabs) every 6 hours as needed.  Let us  know if you need anything.

## 2024-04-04 NOTE — Progress Notes (Signed)
 Musculoskeletal Exam  Patient: Kara Lee DOB: 06/21/1945  DOS: 04/04/2024  SUBJECTIVE:  Chief Complaint:   Chief Complaint  Patient presents with   Arm Pain    Arm and Finger Pain    Kara Lee is a 78 y.o.  female for evaluation and treatment of left hand/finger pain.   Onset: Several weeks ago. No inj or change in activity.  Location: Palm of hand Character:  sharp  Progression of issue:  has worsened Associated symptoms: Catching/locking of her left ring finger No bruising, redness, or swelling Treatment: to date has been: None. Neurovascular symptoms: no  Patient has recurrent pain in her left trapezius region.  She did very well with trigger point injections.  She is requesting more today.  No recent injury or change in activity.  She does have a lot of stress and sometimes will carry in her shoulders.  No bruising, redness, swelling.  Pain will radiate to her left upper extremity.  It is constant and is not associated with exertion.  Patient's husband is having failing health since having a stroke roughly 1 year ago.  He has been in and out of rehab and has a myriad of medical situations since then.  This is causing her a lot of stress.  She is currently taking Cymbalta  60 mg daily.  Compliant, no adverse effects.  Still having lots of anxiety symptoms.  She is not seeing a therapist/counselor.  No homicidal or suicidal ideation.  No self-medication.  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 2nd child   Diabetes mellitus (HCC)    Diverticulosis    Fatty liver    GERD (gastroesophageal reflux disease)    Heart murmur    HLD (hyperlipidemia)    Hypertension    Morbid obesity (HCC)    OSA on CPAP    uses CPAP nightly   Osteoarthritis  of right knee 07/31/2013   PONV (postoperative nausea and vomiting)    Rosacea    Severe aortic stenosis    Sleep apnea    Spinal stenosis     Objective: VITAL SIGNS: BP 124/72 (BP Location: Left Arm, Patient Position: Sitting)   Pulse 85   Temp (!) 97.5 F (36.4 C) (Oral)   Resp 16   Ht 5' 2 (1.575 m)   SpO2 98%   BMI 34.17 kg/m  Constitutional: Well formed, well developed. No acute distress. Thorax & Lungs: No accessory muscle use Musculoskeletal: Left hand.   + Triggering of the left fourth digit Tenderness to palpation: Yes, TTP over the distal palmar surface, fourth metacarpal Deformity: no Ecchymosis: no  She is also having tenderness over several areas of the left trapezius region.  There is also TTP over the distal deltoid and lateral head of the biceps on the left.  Normal active and passive range of motion.  Negative Hawkins, crossover, Neer's, liftoff, empty can Neurologic: Normal sensory function.  Psychiatric: Normal mood. Age appropriate judgment and insight. Alert & oriented x 3.    Procedure note: Trigger finger injection Verbal consent obtained. The area of interest was palpated and just distal over the palmar MCP crease, the tendon was marked with an otoscope speculum. The  area marked by a speculum was cleaned with alcohol and freeze spray was used for topical anesthesia. A 30-gauge needle was inserted at a 45 degree angle and once under the skin, continued in parallel fashion to the tendon, and used to inject 20 mg of Depo-Medrol  and 0.5 mg of 1% lidocaine  without epinephrine .  The plunger of the syringe was withdrawn prior to injecting the medication to ensure the needle was not in a blood vessel. A Band-Aid was placed. The patient tolerated the procedure well with no immediate complications noted.  Procedure note; trigger point injection Informed consent obtained. The areas of interest (x3) were demarcated with an otoscope speculum tip. There were then  cleaned with alcohol. 0.6 mL were injected at each trigger point with a concoction of 20 mg Depo-Medrol  and 1% lidocaine  mixed at a 1:1 ratio. The areas were then bandaged. There were no complications noted. The patient tolerated the procedure well.  Assessment:  Trigger ring finger of left hand - Plan: methylPREDNISolone  acetate (DEPO-MEDROL ) injection 40 mg  Trigger point - Plan: methylPREDNISolone  acetate (DEPO-MEDROL ) injection 40 mg  GAD (generalized anxiety disorder) - Plan: DULoxetine  (CYMBALTA ) 60 MG capsule  Plan: Consider splinting the finger.  Tylenol , ice, heat.  Injection today.  Consider hand referral if not significantly improved. Heat, ice, stretches.  3 trigger point injections today as she has done very well with this in the past.  Consider physical therapy if no improvement. Chronic, not controlled.  Increase Cymbalta  from 60 mg daily to 60 mg twice daily.  Consider counseling though she does not have much time for this at the moment. F/u in 1 month to recheck. The patient voiced understanding and agreement to the plan.   Mabel [place], DO 04/04/2024  12:00 PM

## 2024-04-06 ENCOUNTER — Other Ambulatory Visit: Payer: Self-pay | Admitting: Family Medicine

## 2024-04-06 ENCOUNTER — Other Ambulatory Visit: Payer: Self-pay

## 2024-04-06 ENCOUNTER — Other Ambulatory Visit (HOSPITAL_BASED_OUTPATIENT_CLINIC_OR_DEPARTMENT_OTHER): Payer: Self-pay

## 2024-04-06 DIAGNOSIS — M542 Cervicalgia: Secondary | ICD-10-CM

## 2024-04-06 MED ORDER — TIZANIDINE HCL 4 MG PO TABS
4.0000 mg | ORAL_TABLET | Freq: Four times a day (QID) | ORAL | 0 refills | Status: DC | PRN
  Filled 2024-04-06: qty 21, 6d supply, fill #0

## 2024-04-09 ENCOUNTER — Encounter: Payer: Self-pay | Admitting: Family Medicine

## 2024-04-10 ENCOUNTER — Other Ambulatory Visit (HOSPITAL_BASED_OUTPATIENT_CLINIC_OR_DEPARTMENT_OTHER): Payer: Self-pay

## 2024-04-10 ENCOUNTER — Other Ambulatory Visit: Payer: Self-pay | Admitting: Family Medicine

## 2024-04-10 DIAGNOSIS — F411 Generalized anxiety disorder: Secondary | ICD-10-CM

## 2024-04-10 MED ORDER — DULOXETINE HCL 60 MG PO CPEP
60.0000 mg | ORAL_CAPSULE | Freq: Two times a day (BID) | ORAL | 2 refills | Status: AC
  Filled 2024-04-10 – 2024-04-16 (×2): qty 60, 30d supply, fill #0
  Filled 2024-05-17: qty 60, 30d supply, fill #1

## 2024-04-13 ENCOUNTER — Telehealth: Payer: Self-pay

## 2024-04-13 NOTE — Telephone Encounter (Signed)
-----   Message from Nurse Doreatha BROCKS, RN sent at 03/19/2024  3:23 PM EST ----- Regarding: 05/16/24 afib/svt ablation #Procedure has been moved from 12/22 to 1/28#  Precert:  MD: Kennyth Type of ablation: A-fib/SVT Diagnosis: A-fib/SVT CPT code: A-fib (06343) Ablation scheduled (date/time): 1/28 at 930am  Procedure:  Added to calendar? Yes Orders entered? Yes Letter complete? No, >30 days before procedure Scheduled with cath lab? Yes Any medications to hold? Atenolol  for 3 days Labs ordered (CBC, BMET, PT/INR if on warfarin): Yes Mapping system: Doesn't matter CARTO/OPAL rep notified? No Cardiac CT needed? Yes, ordered Dye allergy? No Pre-meds ordered and instructions given? No, not needed Letter method: MyChart H&P:  Device: No  Follow-up:  Cassie/Angel, please schedule Routine.  Covering RN - please send this message to [employer], EP scheduler, EP Scheduling pool, EP [employer], and CT scheduler (Brittany Lynch/Stephanie Mogg), if indicated.

## 2024-04-16 ENCOUNTER — Other Ambulatory Visit (HOSPITAL_BASED_OUTPATIENT_CLINIC_OR_DEPARTMENT_OTHER): Payer: Self-pay

## 2024-04-16 ENCOUNTER — Other Ambulatory Visit: Payer: Self-pay

## 2024-04-17 ENCOUNTER — Other Ambulatory Visit (HOSPITAL_BASED_OUTPATIENT_CLINIC_OR_DEPARTMENT_OTHER): Payer: Self-pay

## 2024-04-17 ENCOUNTER — Other Ambulatory Visit: Payer: Self-pay | Admitting: Cardiology

## 2024-04-17 MED ORDER — ATENOLOL 25 MG PO TABS
25.0000 mg | ORAL_TABLET | Freq: Two times a day (BID) | ORAL | 1 refills | Status: AC
  Filled 2024-04-17: qty 180, 90d supply, fill #0

## 2024-04-18 ENCOUNTER — Other Ambulatory Visit (HOSPITAL_BASED_OUTPATIENT_CLINIC_OR_DEPARTMENT_OTHER): Payer: Self-pay

## 2024-04-18 ENCOUNTER — Encounter: Payer: Self-pay | Admitting: Family Medicine

## 2024-04-18 ENCOUNTER — Ambulatory Visit (INDEPENDENT_AMBULATORY_CARE_PROVIDER_SITE_OTHER): Admitting: Family Medicine

## 2024-04-18 VITALS — BP 108/60 | HR 81 | Temp 97.9°F | Resp 18 | Ht 62.0 in | Wt 184.0 lb

## 2024-04-18 DIAGNOSIS — R6889 Other general symptoms and signs: Secondary | ICD-10-CM | POA: Diagnosis not present

## 2024-04-18 DIAGNOSIS — J020 Streptococcal pharyngitis: Secondary | ICD-10-CM | POA: Diagnosis not present

## 2024-04-18 DIAGNOSIS — S46812S Strain of other muscles, fascia and tendons at shoulder and upper arm level, left arm, sequela: Secondary | ICD-10-CM

## 2024-04-18 LAB — POC INFLUENZA A&B (BINAX/QUICKVUE)
Influenza A, POC: NEGATIVE
Influenza B, POC: NEGATIVE

## 2024-04-18 LAB — POC COVID19 BINAXNOW: SARS Coronavirus 2 Ag: NEGATIVE

## 2024-04-18 LAB — POCT RAPID STREP A (OFFICE): Rapid Strep A Screen: POSITIVE — AB

## 2024-04-18 MED ORDER — AMOXICILLIN 500 MG PO CAPS
1000.0000 mg | ORAL_CAPSULE | Freq: Every day | ORAL | 0 refills | Status: AC
  Filled 2024-04-18: qty 20, 10d supply, fill #0

## 2024-04-18 MED ORDER — FLUCONAZOLE 150 MG PO TABS
ORAL_TABLET | ORAL | 0 refills | Status: AC
  Filled 2024-04-18: qty 2, 3d supply, fill #0

## 2024-04-18 NOTE — Progress Notes (Signed)
 Chief Complaint  Patient presents with   Sore Throat    Sxs started Saturday, pt states starting an old amoxicillin  rx; only 5 pills. No swab covid or flu, bilateral ear pain, some pain with swallowing, body aches, chills.     Kara Lee here for URI complaints.  Duration: 4 days  Associated symptoms: subjective fever, sinus headache, sinus congestion, sinus pain, rhinorrhea, ear pain, sore throat, and myalgia Denies: itchy watery eyes, ear drainage, wheezing, shortness of breath, and coughing Treatment to date: amox Sick contacts: No  Continues to have L sided neck/shoulder pain.  She had trigger point injections which were not helpful.  Interested in seeing a specialist.  No new weakness but is having radiating pain to the left shoulder.  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 2nd child   Diabetes mellitus (HCC)    Diverticulosis    Fatty liver    GERD (gastroesophageal reflux disease)    Heart murmur    HLD (hyperlipidemia)    Hypertension    Morbid obesity (HCC)    OSA on CPAP    uses CPAP nightly   Osteoarthritis of right knee 07/31/2013   PONV (postoperative nausea and vomiting)    Rosacea    Severe aortic stenosis    Sleep apnea    Spinal stenosis     Objective BP 108/60 (BP Location: Right Arm, Patient Position: Sitting, Cuff Size: Large)   Pulse 81   Temp 97.9 F (36.6 C) (Oral)   Resp 18   Ht 5' 2 (1.575 m)   Wt 184 lb (83.5 kg)   SpO2 98%   BMI 33.65 kg/m  General: Awake, alert, appears stated age HEENT: AT, [place], ears patent b/l and TM's neg, nares patent w/o discharge, pharynx erythematous and without exudates, MMM,  Neck: No masses or asymmetry Heart: RRR MSK: +TTP over L trap Lungs: CTAB, no accessory muscle  use Psych: Age appropriate judgment and insight, normal mood and affect  Strep throat - Plan: POCT rapid strep A, amoxicillin  (AMOXIL ) 500 MG capsule, fluconazole (DIFLUCAN) 150 MG tablet  Flu-like symptoms - Plan: POC COVID-19, POC Influenza A&B (Binax test)  Trapezius strain, left, sequela - Plan: Ambulatory referral to Sports Medicine  1/2. +strep. 10 d of amox 1 g/d. Replcae toothbrush after 24 hrs on abx. Continue to push fluids, practice good hand hygiene, cover mouth when coughing. F/u prn. If starting to experience fevers, shaking, or shortness of breath, seek immediate care. 3. Refer to Dr. Claudene of the sports med team.  Heat, ice, Tylenol .   Follow-up as originally scheduled. Pt voiced understanding and agreement to the plan.  Mabel [place], DO 04/18/2024 11:29 AM

## 2024-04-18 NOTE — Patient Instructions (Addendum)
 OK to take Tylenol  1000 mg (2 extra strength tabs) or 975 mg (3 regular strength tabs) every 6 hours as needed.  Consider throat lozenges, salt water gargles and an air humidifier for symptomatic care.   Throw out/replace your toothbrush after 24 hours on antibiotics.  If you do not hear anything about your referral to Dr. Darlyn Sharps in the next 1-2 weeks, call our office and ask for an update.  Let us  know if you need anything.

## 2024-04-24 ENCOUNTER — Other Ambulatory Visit (HOSPITAL_BASED_OUTPATIENT_CLINIC_OR_DEPARTMENT_OTHER): Payer: Self-pay

## 2024-04-25 ENCOUNTER — Other Ambulatory Visit (HOSPITAL_BASED_OUTPATIENT_CLINIC_OR_DEPARTMENT_OTHER): Payer: Self-pay

## 2024-04-27 ENCOUNTER — Encounter: Payer: Self-pay | Admitting: Family Medicine

## 2024-04-27 ENCOUNTER — Encounter (HOSPITAL_COMMUNITY): Payer: Self-pay

## 2024-04-27 ENCOUNTER — Telehealth (HOSPITAL_COMMUNITY): Payer: Self-pay

## 2024-04-27 NOTE — Telephone Encounter (Signed)
 Spoke with patient to discuss upcoming procedure.   CT: to be completed.  Labs: to be completed.   Any recent signs of acute illness or been started on antibiotics? Pt treated for strep throat on 12/31 and completes Amoxicillin  on today. She reports she has fully recovered and symptoms resolved.  Any new medications started? Amoxicillin   Any missed doses of blood thinner?  No  Advised patient to continue taking Eliquis  (Apixaban ) twice daily without missing any doses. Any medications to hold?  Yes.  HOLD: Tirzepatide  (Mounjaro , Zepbound ) for 7 days prior to the procedure. Last dose on or before Tuesday, January 20.  HOLD: Atenolol  and Diltiazem  for 3 days prior to the procedure. Last dose on Saturday, January 24th.  Medication instructions:  On the morning of your procedure DO NOT take any medication., including Eliquis  (Apixaban ) or the procedure may be rescheduled. Nothing to eat or drink after midnight prior to your procedure.  Confirmed patient is scheduled for Atrial Fibrillation Ablation, SVT Ablation on Wednesday, January 28 with Dr. Kennyth. Instructed patient to arrive at the Main Entrance A at [facility]: [address] [place], [place] [address] and check in at Admitting at 9:30 AM.   Plan to go home the same day, you will only stay overnight if medically necessary. You MUST have a responsible adult to drive you home and MUST be with you the first 24 hours after you arrive home or your procedure could be cancelled.  Informed patient a nurse will call a day before the procedure to confirm arrival time and ensure instructions are followed.  Patient verbalized understanding to all instructions provided and agreed to proceed with procedure BUT did make nurse aware that if her husband comes home from rehab prior to procedure, she will have to reschedule because she is his only caregiver.   Advised patient to contact RN Navigator at [phone], to inform of any new medications  started after call or concerns prior to procedure.

## 2024-04-30 ENCOUNTER — Other Ambulatory Visit: Payer: Self-pay | Admitting: Family Medicine

## 2024-05-01 ENCOUNTER — Ambulatory Visit: Admitting: Family Medicine

## 2024-05-02 ENCOUNTER — Ambulatory Visit: Admitting: Family Medicine

## 2024-05-03 ENCOUNTER — Ambulatory Visit: Admitting: Cardiology

## 2024-05-08 ENCOUNTER — Ambulatory Visit (HOSPITAL_COMMUNITY)

## 2024-05-08 ENCOUNTER — Ambulatory Visit: Admitting: Family Medicine

## 2024-05-11 ENCOUNTER — Encounter: Payer: Self-pay | Admitting: Podiatry

## 2024-05-11 ENCOUNTER — Ambulatory Visit: Admitting: Podiatry

## 2024-05-11 DIAGNOSIS — L84 Corns and callosities: Secondary | ICD-10-CM | POA: Diagnosis not present

## 2024-05-11 DIAGNOSIS — E1142 Type 2 diabetes mellitus with diabetic polyneuropathy: Secondary | ICD-10-CM | POA: Diagnosis not present

## 2024-05-11 NOTE — Progress Notes (Signed)
"  °  Subjective:  Patient ID: Kara Lee, female    DOB: 04/24/1945,   MRN: [id number]  No chief complaint on file.   79 y.o. female presents for concern of bilateral great toe calluses that have been present for a while. They have been very painful. Requesting to have them trimmed today. Denies burning and tingling in their feet. Patient is diabetic and last A1c was  Lab Results  Component Value Date   HGBA1C 6.2 02/21/2024   .   PCP:  Frann Mabel Mt, DO    . Denies any other pedal complaints. Denies n/v/f/c.   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 2nd child   Diabetes mellitus (HCC)    Diverticulosis    Fatty liver    GERD (gastroesophageal reflux disease)    Heart murmur    HLD (hyperlipidemia)    Hypertension    Morbid obesity (HCC)    OSA on CPAP    uses CPAP nightly   Osteoarthritis of right knee 07/31/2013   PONV (postoperative nausea and vomiting)    Rosacea    Severe aortic stenosis    Sleep apnea    Spinal stenosis     Objective:  Physical Exam: Vascular: DP/PT pulses 2/4 bilateral. CFT <3 seconds. Absent hair growth on digits. Edema noted to bilateral lower extremities. Xerosis noted bilaterally. Varicose and spider veins noted to left lower extremity.  Skin. No lacerations or abrasions bilateral feet. Nails 1-5 bilateral  are normal in appearance. Hyperkeartotic lesions plantar hallux bilateral nearly ulcerative.  Musculoskeletal: MMT 5/5 bilateral lower extremities in DF, PF, Inversion and Eversion. Deceased ROM in DF of ankle joint.  Neurological: Sensation intact to light touch. Protective sensation  intact  bilateral.    Assessment:   1. Pre-ulcerative calluses   2. Type 2 diabetes mellitus with peripheral  neuropathy (HCC)       Plan:  Patient was evaluated and treated and all questions answered. -Discussed and educated patient on diabetic foot care, especially with  regards to the vascular, neurological and musculoskeletal systems.  -Stressed the importance of good glycemic control and the detriment of not  controlling glucose levels in relation to the foot. -Discussed supportive shoes at all times and checking feet regularly.  -Mechanically debrided hyperkeratotic lesions plantar hallux bilateral without incident.  -Answered all patient questions -Patient to return  in 4 months for at risk foot care -Patient advised to call the office if any problems or questions arise in the meantime.   Asberry Failing, DPM    "

## 2024-05-15 ENCOUNTER — Ambulatory Visit: Admitting: Family Medicine

## 2024-05-16 ENCOUNTER — Ambulatory Visit (HOSPITAL_COMMUNITY): Admit: 2024-05-16 | Admitting: Cardiology

## 2024-05-16 ENCOUNTER — Encounter (HOSPITAL_COMMUNITY): Payer: Self-pay

## 2024-05-16 SURGERY — ATRIAL FIBRILLATION ABLATION
Anesthesia: General

## 2024-05-17 ENCOUNTER — Other Ambulatory Visit: Payer: Self-pay | Admitting: Family Medicine

## 2024-05-17 ENCOUNTER — Encounter: Payer: Self-pay | Admitting: Family Medicine

## 2024-05-17 ENCOUNTER — Other Ambulatory Visit (HOSPITAL_COMMUNITY): Payer: Self-pay

## 2024-05-17 ENCOUNTER — Other Ambulatory Visit (HOSPITAL_BASED_OUTPATIENT_CLINIC_OR_DEPARTMENT_OTHER): Payer: Self-pay

## 2024-05-17 MED ORDER — PREDNISONE 20 MG PO TABS
40.0000 mg | ORAL_TABLET | Freq: Every day | ORAL | 0 refills | Status: AC
  Filled 2024-05-17 (×2): qty 10, 5d supply, fill #0

## 2024-05-19 ENCOUNTER — Encounter: Payer: Self-pay | Admitting: Family Medicine

## 2024-05-20 ENCOUNTER — Encounter: Payer: Self-pay | Admitting: Family Medicine

## 2024-05-21 ENCOUNTER — Ambulatory Visit: Admitting: Family Medicine

## 2024-05-21 ENCOUNTER — Other Ambulatory Visit (HOSPITAL_COMMUNITY): Payer: Self-pay

## 2024-05-22 ENCOUNTER — Other Ambulatory Visit: Payer: Self-pay

## 2024-05-22 ENCOUNTER — Encounter: Payer: Self-pay | Admitting: Pharmacist

## 2024-05-22 ENCOUNTER — Encounter: Payer: Self-pay | Admitting: Family Medicine

## 2024-05-22 ENCOUNTER — Other Ambulatory Visit (HOSPITAL_BASED_OUTPATIENT_CLINIC_OR_DEPARTMENT_OTHER): Payer: Self-pay

## 2024-05-22 DIAGNOSIS — E1169 Type 2 diabetes mellitus with other specified complication: Secondary | ICD-10-CM

## 2024-05-22 MED ORDER — MOUNJARO 7.5 MG/0.5ML ~~LOC~~ SOAJ
7.5000 mg | SUBCUTANEOUS | 1 refills | Status: AC
  Filled 2024-05-22: qty 2, 28d supply, fill #0

## 2024-05-22 NOTE — Progress Notes (Signed)
 Pharmacy Quality Measure Review  This patient is appearing on the insurance-providing list for failing the adherence measure for Statin Therapy for Patients with Cardiovascular Disease (SPC) and Statin Use in Persons with Diabetes (SUPD) medications in 2025. However, patient has been taking pitavastatin  but it is not covered by her [employer] plan so she has been purchasing off insurance thru [facility] for about $100 / 90 days.  Patient has tried the following statins and had myalgias to all of them:  Atorvastatin  Simvastatin  Rosuvastatin (2015) Pravastatin  20mg  (2020)  Pitavastatin  is the only statin she has been able to tolerate and has gotten her lipid numbers to goal.   Lab Results  Component Value Date   CHOL 134 02/21/2024   HDL 57.60 02/21/2024   LDLCALC 53 02/21/2024   TRIG 113.0 02/21/2024   CHOLHDL 2 02/21/2024   Called [employer] to request formulary exception for pitavastatin . If approved it would would still be tier 4 - or 31% of med cost which I estimate would be around $105. Patient might also be able to apply for a [employer] grant that would cover her copay.  She would like to consider this but asked that I call back in 1 to 2 weeks because her husband is not well.   Plan to follow up in 2 weeks.   Madelin Ray, PharmD Clinical Pharmacist [facility]  Population Health [phone]

## 2024-05-24 ENCOUNTER — Encounter: Payer: Self-pay | Admitting: Family Medicine

## 2024-05-24 ENCOUNTER — Other Ambulatory Visit (HOSPITAL_BASED_OUTPATIENT_CLINIC_OR_DEPARTMENT_OTHER): Payer: Self-pay

## 2024-05-25 ENCOUNTER — Other Ambulatory Visit (HOSPITAL_BASED_OUTPATIENT_CLINIC_OR_DEPARTMENT_OTHER): Payer: Self-pay

## 2024-05-25 ENCOUNTER — Other Ambulatory Visit: Payer: Self-pay

## 2024-05-25 ENCOUNTER — Ambulatory Visit: Admitting: Family Medicine

## 2024-05-25 MED ORDER — HYDROCORTISONE (PERIANAL) 2.5 % EX CREA
1.0000 | TOPICAL_CREAM | Freq: Two times a day (BID) | CUTANEOUS | 0 refills | Status: AC
  Filled 2024-05-25: qty 30, 10d supply, fill #0

## 2024-05-31 ENCOUNTER — Other Ambulatory Visit: Admitting: Pharmacist

## 2024-06-06 ENCOUNTER — Ambulatory Visit: Admitting: Family Medicine

## 2024-06-11 ENCOUNTER — Ambulatory Visit: Admitting: Cardiology

## 2024-08-30 ENCOUNTER — Ambulatory Visit: Admitting: Podiatry

## 2025-02-20 ENCOUNTER — Ambulatory Visit
